# Patient Record
Sex: Male | Born: 1955 | Race: White | Hispanic: No | Smoking: Never smoker
Health system: Southern US, Community
[De-identification: ages and names within clinical notes are randomized; demographics above are authoritative.]

## PROBLEM LIST (undated history)

## (undated) DIAGNOSIS — E119 Type 2 diabetes mellitus without complications: Secondary | ICD-10-CM

## (undated) DIAGNOSIS — G4733 Obstructive sleep apnea (adult) (pediatric): Secondary | ICD-10-CM

## (undated) DIAGNOSIS — J449 Chronic obstructive pulmonary disease, unspecified: Secondary | ICD-10-CM

## (undated) DIAGNOSIS — C189 Malignant neoplasm of colon, unspecified: Secondary | ICD-10-CM

## (undated) DIAGNOSIS — I2510 Atherosclerotic heart disease of native coronary artery without angina pectoris: Secondary | ICD-10-CM

## (undated) DIAGNOSIS — I429 Cardiomyopathy, unspecified: Secondary | ICD-10-CM

## (undated) HISTORY — PX: COLON RESECTION: SHX5231

---

## 2021-05-26 HISTORY — PX: TRACHEOSTOMY: SUR1362

## 2021-05-26 HISTORY — PX: PEG PLACEMENT: SHX5437

## 2021-06-10 ENCOUNTER — Inpatient Hospital Stay: Admit: 2021-06-10 | Discharge: 2021-07-15 | Payer: Medicare Other | Source: Other Acute Inpatient Hospital

## 2021-06-10 ENCOUNTER — Other Ambulatory Visit (HOSPITAL_COMMUNITY): Payer: Medicare Other

## 2021-06-10 DIAGNOSIS — Z9911 Dependence on respirator [ventilator] status: Secondary | ICD-10-CM

## 2021-06-10 DIAGNOSIS — J90 Pleural effusion, not elsewhere classified: Secondary | ICD-10-CM

## 2021-06-10 DIAGNOSIS — A419 Sepsis, unspecified organism: Secondary | ICD-10-CM

## 2021-06-10 DIAGNOSIS — I5032 Chronic diastolic (congestive) heart failure: Secondary | ICD-10-CM

## 2021-06-10 DIAGNOSIS — J9621 Acute and chronic respiratory failure with hypoxia: Secondary | ICD-10-CM

## 2021-06-10 DIAGNOSIS — J811 Chronic pulmonary edema: Secondary | ICD-10-CM

## 2021-06-10 DIAGNOSIS — R6521 Severe sepsis with septic shock: Secondary | ICD-10-CM

## 2021-06-10 DIAGNOSIS — J189 Pneumonia, unspecified organism: Secondary | ICD-10-CM

## 2021-06-10 DIAGNOSIS — Z930 Tracheostomy status: Secondary | ICD-10-CM

## 2021-06-10 DIAGNOSIS — I480 Paroxysmal atrial fibrillation: Secondary | ICD-10-CM

## 2021-06-10 DIAGNOSIS — N170 Acute kidney failure with tubular necrosis: Secondary | ICD-10-CM

## 2021-06-10 DIAGNOSIS — J9690 Respiratory failure, unspecified, unspecified whether with hypoxia or hypercapnia: Secondary | ICD-10-CM

## 2021-06-10 DIAGNOSIS — N19 Unspecified kidney failure: Secondary | ICD-10-CM

## 2021-06-10 DIAGNOSIS — Z931 Gastrostomy status: Secondary | ICD-10-CM

## 2021-06-10 HISTORY — DX: Cardiomyopathy, unspecified: I42.9

## 2021-06-10 HISTORY — DX: Type 2 diabetes mellitus without complications: E11.9

## 2021-06-10 HISTORY — DX: Malignant neoplasm of colon, unspecified: C18.9

## 2021-06-10 HISTORY — DX: Chronic obstructive pulmonary disease, unspecified: J44.9

## 2021-06-10 HISTORY — DX: Atherosclerotic heart disease of native coronary artery without angina pectoris: I25.10

## 2021-06-10 HISTORY — DX: Obstructive sleep apnea (adult) (pediatric): G47.33

## 2021-06-10 MED ORDER — DIATRIZOATE MEGLUMINE & SODIUM 66-10 % PO SOLN
ORAL | Status: AC
  Administered 2021-06-10: 30 mL via GASTROSTOMY
  Filled 2021-06-10: qty 30

## 2021-06-11 DIAGNOSIS — I5032 Chronic diastolic (congestive) heart failure: Secondary | ICD-10-CM

## 2021-06-11 DIAGNOSIS — J9621 Acute and chronic respiratory failure with hypoxia: Secondary | ICD-10-CM

## 2021-06-11 DIAGNOSIS — N170 Acute kidney failure with tubular necrosis: Secondary | ICD-10-CM | POA: Diagnosis not present

## 2021-06-11 DIAGNOSIS — R6521 Severe sepsis with septic shock: Secondary | ICD-10-CM

## 2021-06-11 DIAGNOSIS — I480 Paroxysmal atrial fibrillation: Secondary | ICD-10-CM

## 2021-06-11 DIAGNOSIS — A419 Sepsis, unspecified organism: Secondary | ICD-10-CM

## 2021-06-11 DIAGNOSIS — Z9911 Dependence on respirator [ventilator] status: Secondary | ICD-10-CM

## 2021-06-11 DIAGNOSIS — Z930 Tracheostomy status: Secondary | ICD-10-CM

## 2021-06-11 LAB — BLOOD GAS, ARTERIAL
Acid-base deficit: 2.4 mmol/L — ABNORMAL HIGH (ref 0.0–2.0)
Bicarbonate: 21.9 mmol/L (ref 20.0–28.0)
FIO2: 40
O2 Saturation: 97.4 %
Patient temperature: 36.5
pCO2 arterial: 36.6 mmHg (ref 32.0–48.0)
pH, Arterial: 7.392 (ref 7.350–7.450)
pO2, Arterial: 88.8 mmHg (ref 83.0–108.0)

## 2021-06-11 LAB — TSH: TSH: 4.015 u[IU]/mL (ref 0.350–4.500)

## 2021-06-11 LAB — CBC
HCT: 28.1 % — ABNORMAL LOW (ref 39.0–52.0)
Hemoglobin: 8.6 g/dL — ABNORMAL LOW (ref 13.0–17.0)
MCH: 29.2 pg (ref 26.0–34.0)
MCHC: 30.6 g/dL (ref 30.0–36.0)
MCV: 95.3 fL (ref 80.0–100.0)
Platelets: 364 10*3/uL (ref 150–400)
RBC: 2.95 MIL/uL — ABNORMAL LOW (ref 4.22–5.81)
RDW: 15.5 % (ref 11.5–15.5)
WBC: 5.9 10*3/uL (ref 4.0–10.5)
nRBC: 0 % (ref 0.0–0.2)

## 2021-06-11 LAB — COMPREHENSIVE METABOLIC PANEL
ALT: 14 U/L (ref 0–44)
AST: 16 U/L (ref 15–41)
Albumin: 3 g/dL — ABNORMAL LOW (ref 3.5–5.0)
Alkaline Phosphatase: 63 U/L (ref 38–126)
Anion gap: 15 (ref 5–15)
BUN: 102 mg/dL — ABNORMAL HIGH (ref 8–23)
CO2: 21 mmol/L — ABNORMAL LOW (ref 22–32)
Calcium: 10.4 mg/dL — ABNORMAL HIGH (ref 8.9–10.3)
Chloride: 104 mmol/L (ref 98–111)
Creatinine, Ser: 2.21 mg/dL — ABNORMAL HIGH (ref 0.61–1.24)
GFR, Estimated: 32 mL/min — ABNORMAL LOW (ref >60)
Glucose, Bld: 131 mg/dL — ABNORMAL HIGH (ref 70–99)
Potassium: 3.5 mmol/L (ref 3.5–5.1)
Sodium: 140 mmol/L (ref 135–145)
Total Bilirubin: 0.6 mg/dL (ref 0.3–1.2)
Total Protein: 6.8 g/dL (ref 6.5–8.1)

## 2021-06-11 LAB — IRON AND TIBC
Iron: 27 ug/dL — ABNORMAL LOW (ref 45–182)
Saturation Ratios: 13 % — ABNORMAL LOW (ref 17.9–39.5)
TIBC: 211 ug/dL — ABNORMAL LOW (ref 250–450)
UIBC: 184 ug/dL

## 2021-06-11 LAB — PROTIME-INR
INR: 1.2 (ref 0.8–1.2)
Prothrombin Time: 15.2 seconds (ref 11.4–15.2)

## 2021-06-11 LAB — FOLATE: Folate: 15.9 ng/mL (ref >5.9)

## 2021-06-11 LAB — VITAMIN B12: Vitamin B-12: 1402 pg/mL — ABNORMAL HIGH (ref 180–914)

## 2021-06-11 LAB — APTT: aPTT: 31 seconds (ref 24–36)

## 2021-06-11 NOTE — Consult Note (Signed)
Pulmonary Critical Care Medicine [place]  Date of Service: 06/11/2021  PULMONARY CRITICAL CARE CONSULT   Tony Young  TFT:[id number]  DOB: [birth year]   DOA: 06/10/2021  Referring Physician: Satira Sark, MD  HPI: Tony Young is a 67 y.o. male seen for follow up of Acute on Chronic Respiratory Failure.  Patient is transferred to our facility for further management and weaning as multiple medical issues including CHF COPD CAD diabetes mellitus who came into the hospital because of a diagnosis of necrotizing fasciitis of lower extremity.  Patient underwent treatment with debridement antibiotics however had significant complications with development of acute renal failure respiratory failure sepsis and shock.  The patient ended up on mechanical ventilation ended up with a tracheostomy for failure to come off the ventilator and was therefore then transferred to our facility for further management and for weaning  Review of Systems:  ROS performed and is unremarkable other than noted above.  Past medical history: COPD CHF Diabetes Coronary artery disease Necrotizing fasciitis GI bleed Sepsis Glaucoma Hypothyroidism Anxiety  Past surgical history: Tracheostomy Debridement of lower extremity  Social history: Unknown tobacco alcohol drug abuse   Family History: Non-Contributory to the present illness  Medications: Reviewed on Rounds  Physical Exam:  Vitals: Temperature is 98.2 pulse 129 respiratory 24 blood pressure is 160/76 saturations 100%  Ventilator Settings on assist control tidal volume 550 PEEP 5  General: Comfortable at this time Eyes: Grossly normal lids, irises & conjunctiva ENT: grossly tongue is normal Neck: no obvious mass Cardiovascular: S1-S2 normal no gallop or rub Respiratory: Scattered coarse rhonchi Abdomen: Soft and nontender Skin: no rash seen on limited exam Musculoskeletal: not  rigid Psychiatric:unable to assess Neurologic: no seizure no involuntary movements         Labs on Admission:  Basic Metabolic Panel: Recent Labs  Lab 06/10/21 2342  NA 140  K 3.5  CL 104  CO2 21*  GLUCOSE 131*  BUN 102*  CREATININE 2.21*  CALCIUM 10.4*    Recent Labs  Lab 06/11/21 0130  PHART 7.392  PCO2ART 36.6  PO2ART 88.8  HCO3 21.9  O2SAT 97.4    Liver Function Tests: Recent Labs  Lab 06/10/21 2342  AST 16  ALT 14  ALKPHOS 63  BILITOT 0.6  PROT 6.8  ALBUMIN 3.0*   No results for input(s): LIPASE, AMYLASE in the last 168 hours. No results for input(s): AMMONIA in the last 168 hours.  CBC: Recent Labs  Lab 06/10/21 2342  WBC 5.9  HGB 8.6*  HCT 28.1*  MCV 95.3  PLT 364    Cardiac Enzymes: No results for input(s): CKTOTAL, CKMB, CKMBINDEX, TROPONINI in the last 168 hours.  BNP (last 3 results) No results for input(s): BNP in the last 8760 hours.  ProBNP (last 3 results) No results for input(s): PROBNP in the last 8760 hours.   Radiological Exams on Admission: DG ABDOMEN PEG TUBE LOCATION  Result Date: 06/10/2021 CLINICAL DATA:  Peg tube status. EXAM: ABDOMEN - 1 VIEW COMPARISON:  None. FINDINGS: Portable supine view of the abdomen was obtained after the installation of 30 cc Gastrografin via indwelling gastrostomy tube. Contrast opacifies the stomach. There is no evidence of extravasation or leak. IMPRESSION: Gastrostomy tube in the stomach. No evidence of extravasation or leak. Electronically Signed   By: Keith Rake M.D.   On: 06/10/2021 23:33   DG CHEST PORT 1 VIEW  Result Date: 06/10/2021 CLINICAL DATA:  PEG tube check.  EXAM: PORTABLE CHEST 1 VIEW COMPARISON:  None. FINDINGS: 11:22 p.m., 06/10/2021. A tracheostomy cannula has its tip 4.7 cm from the carina. There is a right IJ double-lumen catheter with its tip in the upper right atrium. The PEG tube is not within the imaged volume. Right PICC at least reaches as far as the mid SVC but  is obscured below that level due to the overlying double-lumen catheter. The heart is moderately enlarged. There is perihilar vascular congestion and moderate perihilar edema, with alveolar infiltrates extending from the hila into the mid and lower lung fields. Dense patchy opacities in the lower lung fields could be due to edema, atelectasis or superimposed pneumonia and are greater on the right. There is a moderate right and small left layering pleural effusions. The apical 1/3 of the lungs are clear. Thoracic cage is intact with thoracic spine spondylosis. IMPRESSION: 1. Cardiomegaly with perihilar vascular congestion and moderate central edema. 2. Perihilar and lower zonal opacities consistent with edema, pneumonia, atelectasis or combination, with right greater than left pleural effusions. 3. Support lines and tracheostomy cannula as above. PEG tube is not included in the image. Electronically Signed   By: Telford Nab M.D.   On: 06/10/2021 23:36    Assessment/Plan Active Problems:   Acute on chronic respiratory failure with hypoxia (HCC)   Severe sepsis with septic shock (HCC)   Tracheostomy status (HCC)   Chronic heart failure with preserved ejection fraction (HCC)   AF (paroxysmal atrial fibrillation) (HCC)   Acute renal failure due to tubular necrosis ([place])   Acute on chronic respiratory failure with hypoxia patient is on the ventilator and full support at the increased heart rate so respiratory therapist can hold off on trying weaning.  Chest x-ray still showing some central edema may benefit from diuresis we will review chart further Severe sepsis with shock right now patient is hemodynamically stable we will continue to monitor patient's counts and cultures. Tracheostomy status tracheostomy will remain in place we will continue to wean the patient as tolerated. Chronic heart failure with diastolic dysfunction preserved ejection fraction patient's fluid status needs to be followed closely  currently the chest film shows vascular congestion diuresis as tolerated Paroxysmal atrial fibrillation patient will need to be monitored for any recurrence Acute renal failure follow patient's lab work closely  I have personally seen and evaluated the patient, evaluated laboratory and imaging results, formulated the assessment and plan and placed orders. The Patient requires high complexity decision making with multiple systems involvement.  Case was discussed on Rounds with the Respiratory Therapy Director and the Respiratory staff Time Spent 70minutes  Ezio Wieck A Cruzita Lipa, MD [facility] Pulmonary Critical Care Medicine Sleep Medicine

## 2021-06-12 ENCOUNTER — Other Ambulatory Visit (HOSPITAL_COMMUNITY): Payer: Medicare Other

## 2021-06-12 DIAGNOSIS — I5032 Chronic diastolic (congestive) heart failure: Secondary | ICD-10-CM | POA: Diagnosis not present

## 2021-06-12 DIAGNOSIS — N170 Acute kidney failure with tubular necrosis: Secondary | ICD-10-CM | POA: Diagnosis not present

## 2021-06-12 DIAGNOSIS — I480 Paroxysmal atrial fibrillation: Secondary | ICD-10-CM | POA: Diagnosis not present

## 2021-06-12 DIAGNOSIS — J9621 Acute and chronic respiratory failure with hypoxia: Secondary | ICD-10-CM | POA: Diagnosis not present

## 2021-06-12 LAB — TRIGLYCERIDES: Triglycerides: 74 mg/dL (ref <150)

## 2021-06-12 LAB — CBC
HCT: 25.3 % — ABNORMAL LOW (ref 39.0–52.0)
Hemoglobin: 7.9 g/dL — ABNORMAL LOW (ref 13.0–17.0)
MCH: 29.6 pg (ref 26.0–34.0)
MCHC: 31.2 g/dL (ref 30.0–36.0)
MCV: 94.8 fL (ref 80.0–100.0)
Platelets: 373 10*3/uL (ref 150–400)
RBC: 2.67 MIL/uL — ABNORMAL LOW (ref 4.22–5.81)
RDW: 15.5 % (ref 11.5–15.5)
WBC: 4.4 10*3/uL (ref 4.0–10.5)
nRBC: 0 % (ref 0.0–0.2)

## 2021-06-12 LAB — RENAL FUNCTION PANEL
Albumin: 2.9 g/dL — ABNORMAL LOW (ref 3.5–5.0)
Anion gap: 12 (ref 5–15)
BUN: 116 mg/dL — ABNORMAL HIGH (ref 8–23)
CO2: 23 mmol/L (ref 22–32)
Calcium: 10.1 mg/dL (ref 8.9–10.3)
Chloride: 103 mmol/L (ref 98–111)
Creatinine, Ser: 2.64 mg/dL — ABNORMAL HIGH (ref 0.61–1.24)
GFR, Estimated: 26 mL/min — ABNORMAL LOW (ref >60)
Glucose, Bld: 330 mg/dL — ABNORMAL HIGH (ref 70–99)
Phosphorus: 5.5 mg/dL — ABNORMAL HIGH (ref 2.5–4.6)
Potassium: 4.4 mmol/L (ref 3.5–5.1)
Sodium: 138 mmol/L (ref 135–145)

## 2021-06-12 LAB — AMMONIA: Ammonia: 18 umol/L (ref 9–35)

## 2021-06-12 NOTE — Progress Notes (Signed)
Patient seen at bedside in [facility] for  therapeutic thoracentesis. [place] limited chest shows trace amount of pleural fluid noted  Insufficient to perform a safe thoracentesis. Procedure not performed.

## 2021-06-12 NOTE — Progress Notes (Signed)
[name]   PULMONARY CRITICAL CARE SERVICE  PROGRESS NOTE     Tony Young  [id number]:[id number]  DOB: 1956-03-24   DOA: 06/10/2021  Referring Physician: Satira Sark, MD  HPI: Tony Young is a 66 y.o. male being followed for ventilator/airway/oxygen weaning Acute on Chronic Respiratory Failure.  Patient remains on the ventilator looks little bit more comfortable today.  Review of the chest film shows possible pleural effusion on the right side there is also pleural effusion on the left  Medications: Reviewed on Rounds  Physical Exam:  Vitals: Temperature is 98.3 pulse 104 respiratory 23 blood pressure is 146/78 saturations 98%  Ventilator Settings on assist control FiO2 is 35% tidal volume 550 PEEP 8  General: Comfortable at this time Neck: supple Cardiovascular: no malignant arrhythmias Respiratory: Diminished breath sounds on the left as well as right Skin: no rash seen on limited exam Musculoskeletal: No gross abnormality Psychiatric:unable to assess Neurologic:no involuntary movements         Lab Data:   Basic Metabolic Panel: Recent Labs  Lab 06/10/21 2342 06/12/21 0139  NA 140 138  K 3.5 4.4  CL 104 103  CO2 21* 23  GLUCOSE 131* 330*  BUN 102* 116*  CREATININE 2.21* 2.64*  CALCIUM 10.4* 10.1  PHOS  --  5.5*    ABG: Recent Labs  Lab 06/11/21 0130  PHART 7.392  PCO2ART 36.6  PO2ART 88.8  HCO3 21.9  O2SAT 97.4    Liver Function Tests: Recent Labs  Lab 06/10/21 2342 06/12/21 0139  AST 16  --   ALT 14  --   ALKPHOS 63  --   BILITOT 0.6  --   PROT 6.8  --   ALBUMIN 3.0* 2.9*   No results for input(s): LIPASE, AMYLASE in the last 168 hours. Recent Labs  Lab 06/12/21 0139  AMMONIA 18    CBC: Recent Labs  Lab 06/10/21 2342 06/12/21 0139  WBC 5.9 4.4  HGB 8.6* 7.9*  HCT 28.1* 25.3*  MCV 95.3 94.8  PLT 364 373    Cardiac Enzymes: No results for input(s):  CKTOTAL, CKMB, CKMBINDEX, TROPONINI in the last 168 hours.  BNP (last 3 results) No results for input(s): BNP in the last 8760 hours.  ProBNP (last 3 results) No results for input(s): PROBNP in the last 8760 hours.  Radiological Exams: DG ABDOMEN PEG TUBE LOCATION  Result Date: 06/10/2021 CLINICAL DATA:  Peg tube status. EXAM: ABDOMEN - 1 VIEW COMPARISON:  None. FINDINGS: Portable supine view of the abdomen was obtained after the installation of 30 cc Gastrografin via indwelling gastrostomy tube. Contrast opacifies the stomach. There is no evidence of extravasation or leak. IMPRESSION: Gastrostomy tube in the stomach. No evidence of extravasation or leak. Electronically Signed   By: Keith Rake M.D.   On: 06/10/2021 23:33   DG CHEST PORT 1 VIEW  Result Date: 06/10/2021 CLINICAL DATA:  PEG tube check. EXAM: PORTABLE CHEST 1 VIEW COMPARISON:  None. FINDINGS: 11:22 p.m., 06/10/2021. A tracheostomy cannula has its tip 4.7 cm from the carina. There is a right IJ double-lumen catheter with its tip in the upper right atrium. The PEG tube is not within the imaged volume. Right PICC at least reaches as far as the mid SVC but is obscured below that level due to the overlying double-lumen catheter. The heart is moderately enlarged. There is perihilar vascular congestion and moderate perihilar edema, with alveolar infiltrates extending from the hila into  the mid and lower lung fields. Dense patchy opacities in the lower lung fields could be due to edema, atelectasis or superimposed pneumonia and are greater on the right. There is a moderate right and small left layering pleural effusions. The apical 1/3 of the lungs are clear. Thoracic cage is intact with thoracic spine spondylosis. IMPRESSION: 1. Cardiomegaly with perihilar vascular congestion and moderate central edema. 2. Perihilar and lower zonal opacities consistent with edema, pneumonia, atelectasis or combination, with right greater than left pleural  effusions. 3. Support lines and tracheostomy cannula as above. PEG tube is not included in the image. Electronically Signed   By: Telford Nab M.D.   On: 06/10/2021 23:36    Assessment/Plan Active Problems:   Acute on chronic respiratory failure with hypoxia (HCC)   Severe sepsis with septic shock (HCC)   Tracheostomy status (HCC)   Chronic heart failure with preserved ejection fraction (HCC)   AF (paroxysmal atrial fibrillation) (HCC)   Acute renal failure due to tubular necrosis (HCC)   Acute on chronic respiratory failure with hypoxia plan is going to be to continue with the ventilator on full support currently on assist control mode we will continue with pulmonary toilet supportive care Pleural effusion no change we will continue to follow along closely. Chronic heart failure preserved ejection fraction we will need to monitor the patient's fluid status very closely pleural effusions as noted above Paroxysmal atrial fibrillation rate is controlled at this time we will continue with supportive care Severe sepsis with shock treated resolving hemodynamics are stable Acute renal failure following patient's lab work closely   I have personally seen and evaluated the patient, evaluated laboratory and imaging results, formulated the assessment and plan and placed orders. The Patient requires high complexity decision making with multiple systems involvement.  Rounds were done with the Respiratory Therapy Director and Staff therapists and discussed with nursing staff also.  [name], MD [facility] Pulmonary Critical Care Medicine Sleep Medicine

## 2021-06-13 LAB — CULTURE, RESPIRATORY W GRAM STAIN: Culture: NORMAL

## 2021-06-14 ENCOUNTER — Other Ambulatory Visit (HOSPITAL_COMMUNITY): Payer: Medicare Other

## 2021-06-14 DIAGNOSIS — N170 Acute kidney failure with tubular necrosis: Secondary | ICD-10-CM | POA: Diagnosis not present

## 2021-06-14 DIAGNOSIS — I480 Paroxysmal atrial fibrillation: Secondary | ICD-10-CM | POA: Diagnosis not present

## 2021-06-14 DIAGNOSIS — I5032 Chronic diastolic (congestive) heart failure: Secondary | ICD-10-CM | POA: Diagnosis not present

## 2021-06-14 DIAGNOSIS — J9621 Acute and chronic respiratory failure with hypoxia: Secondary | ICD-10-CM | POA: Diagnosis not present

## 2021-06-14 LAB — CBC
HCT: 27.1 % — ABNORMAL LOW (ref 39.0–52.0)
Hemoglobin: 8.7 g/dL — ABNORMAL LOW (ref 13.0–17.0)
MCH: 30.4 pg (ref 26.0–34.0)
MCHC: 32.1 g/dL (ref 30.0–36.0)
MCV: 94.8 fL (ref 80.0–100.0)
Platelets: 468 10*3/uL — ABNORMAL HIGH (ref 150–400)
RBC: 2.86 MIL/uL — ABNORMAL LOW (ref 4.22–5.81)
RDW: 15.2 % (ref 11.5–15.5)
WBC: 6.8 10*3/uL (ref 4.0–10.5)
nRBC: 0.3 % — ABNORMAL HIGH (ref 0.0–0.2)

## 2021-06-14 LAB — RENAL FUNCTION PANEL
Albumin: 3 g/dL — ABNORMAL LOW (ref 3.5–5.0)
Anion gap: 14 (ref 5–15)
BUN: 126 mg/dL — ABNORMAL HIGH (ref 8–23)
CO2: 25 mmol/L (ref 22–32)
Calcium: 10.4 mg/dL — ABNORMAL HIGH (ref 8.9–10.3)
Chloride: 105 mmol/L (ref 98–111)
Creatinine, Ser: 2.53 mg/dL — ABNORMAL HIGH (ref 0.61–1.24)
GFR, Estimated: 27 mL/min — ABNORMAL LOW (ref >60)
Glucose, Bld: 185 mg/dL — ABNORMAL HIGH (ref 70–99)
Phosphorus: 6.4 mg/dL — ABNORMAL HIGH (ref 2.5–4.6)
Potassium: 3.7 mmol/L (ref 3.5–5.1)
Sodium: 144 mmol/L (ref 135–145)

## 2021-06-14 NOTE — Progress Notes (Signed)
Pulmonary [place]   PULMONARY CRITICAL CARE SERVICE  PROGRESS NOTE     Tony Young  IZT:[id number]  DOB: 1955-06-07   DOA: 06/10/2021  Referring Physician: Satira Sark, MD  HPI: Tony Young is a 66 y.o. male being followed for ventilator/airway/oxygen weaning Acute on Chronic Respiratory Failure.  Patient is on the ventilator full support assist control mode currently on 35% FiO2 had a thoracentesis attempted yesterday not enough fluid  Medications: Reviewed on Rounds  Physical Exam:  Vitals: Temperature is 98.8 pulse 112 respiratory 30 blood pressure is 121/67 saturations 99%  Ventilator Settings assist control FiO2 35% tidal volume is 550 PEEP 8  General: Comfortable at this time Neck: supple Cardiovascular: no malignant arrhythmias Respiratory: Scattered rhonchi expansion is equal Skin: no rash seen on limited exam Musculoskeletal: No gross abnormality Psychiatric:unable to assess Neurologic:no involuntary movements         Lab Data:   Basic Metabolic Panel: Recent Labs  Lab 06/10/21 2342 06/12/21 0139 06/14/21 0504  NA 140 138 144  K 3.5 4.4 3.7  CL 104 103 105  CO2 21* 23 25  GLUCOSE 131* 330* 185*  BUN 102* 116* 126*  CREATININE 2.21* 2.64* 2.53*  CALCIUM 10.4* 10.1 10.4*  PHOS  --  5.5* 6.4*    ABG: Recent Labs  Lab 06/11/21 0130  PHART 7.392  PCO2ART 36.6  PO2ART 88.8  HCO3 21.9  O2SAT 97.4    Liver Function Tests: Recent Labs  Lab 06/10/21 2342 06/12/21 0139 06/14/21 0504  AST 16  --   --   ALT 14  --   --   ALKPHOS 63  --   --   BILITOT 0.6  --   --   PROT 6.8  --   --   ALBUMIN 3.0* 2.9* 3.0*   No results for input(s): LIPASE, AMYLASE in the last 168 hours. Recent Labs  Lab 06/12/21 0139  AMMONIA 18    CBC: Recent Labs  Lab 06/10/21 2342 06/12/21 0139 06/14/21 0504  WBC 5.9 4.4 6.8  HGB 8.6* 7.9* 8.7*  HCT 28.1* 25.3* 27.1*  MCV 95.3 94.8  94.8  PLT 364 373 468*    Cardiac Enzymes: No results for input(s): CKTOTAL, CKMB, CKMBINDEX, TROPONINI in the last 168 hours.  BNP (last 3 results) No results for input(s): BNP in the last 8760 hours.  ProBNP (last 3 results) No results for input(s): PROBNP in the last 8760 hours.  Radiological Exams: DG Chest Port 1 View  Result Date: 06/14/2021 CLINICAL DATA:  Shortness of breath EXAM: PORTABLE CHEST 1 VIEW COMPARISON:  Four days ago FINDINGS: Improved vascular congestion and hazy appearance of the chest. Continued cardiopericardial enlargement. Dialysis catheter on the right. Tracheostomy tube in place. No pneumothorax. IMPRESSION: Improved aeration, likely improving edema and pleural fluid. Electronically Signed   By: Jorje Guild M.D.   On: 06/14/2021 07:09   IR US CHEST  Result Date: 06/12/2021 CLINICAL DATA:  66 year old male with concern for pleural effusion. EXAM: CHEST ULTRASOUND COMPARISON:  Chest radiograph from 06/10/2021 FINDINGS: No evidence of significant pleural effusion bilaterally. IMPRESSION: No evidence of significant pleural effusion bilaterally. No thoracentesis was performed. Ruthann Cancer, MD [facility] Radiology Electronically Signed   By: Ruthann Cancer M.D.   On: 06/12/2021 16:28    Assessment/Plan Active Problems:   Acute on chronic respiratory failure with hypoxia (HCC)   Severe sepsis with septic shock (HCC)   Tracheostomy  status ([place])   Chronic heart failure with preserved ejection fraction (HCC)   AF (paroxysmal atrial fibrillation) (HCC)   Acute renal failure due to tubular necrosis (HCC)   Acute on chronic respiratory failure with hypoxia we will continue with full support on the ventilator patient is on assist control mode titrating oxygen as tolerated.  CT scan suggested for follow-up on the abnormalities noted on the chest film Severe sepsis with shock hemodynamics are stable improved no fevers  are noted at this time Pleural effusion thoracentesis evaluated not enough fluid to do withdrawal Chronic heart failure preserved ejection fraction compensated slowly improving x-rays we will continue to monitor closely Atrial fibrillation rate is controlled at this time Acute renal failure following patient's lab work closely   I have personally seen and evaluated the patient, evaluated laboratory and imaging results, formulated the assessment and plan and placed orders. The Patient requires high complexity decision making with multiple systems involvement.  Rounds were done with the Respiratory Therapy Director and Staff therapists and discussed with nursing staff also.  [name], MD [facility] Pulmonary Critical Care Medicine Sleep Medicine

## 2021-06-14 NOTE — Consult Note (Signed)
Infectious Disease Consultation   Tony Young  KPT:[id number]  DOB: 1956/05/09  DOA: 06/10/2021  Requesting physician: Dr. Owens Shark  Reason for consultation: Antibiotic Recommendations   History of Present Illness: Tony Young is an 66 y.o. male with medical history significant for COPD, congestive heart failure, diabetes mellitus, coronary artery disease who was admitted to the acute facility on 05/13/2021 with complaints of worsening shortness of breath, fever, found to have necrotizing fasciitis of the left lower extremity.  He was started on broad-spectrum antimicrobials. He underwent multiple debridements.  Hospital course was complicated by acute kidney injury needing dialysis, respiratory failure, sepsis with shock, GI bleed, bacteremia.  He was seen by GI, wound care, cardiology, general surgery, nephrology, infectious disease.  He was initially treated with IV vancomycin.  However, due to the renal failure vancomycin was discontinued and he was started on doxycycline.  He also had respiratory failure due to sepsis and shock and was placed on mechanical ventilation and ended up with a tracheostomy.  He was eventually stabilized and transferred to [facility].  After admission here he apparently had fever.  Review of Systems:  patient is encephalopathic and confused.  Unable to obtain review of systems at this time.   Past Medical History: Coronary artery disease, COPD, CHF, diabetes, anxiety, depression, hypothyroidism, necrotizing fasciitis, history of GI bleed  Past Surgical History: Tracheostomy, multiple debridements of lower extremity  Allergies: Penicillin, reaction unknown.   Social History:  has no history on file for tobacco use, alcohol use, and drug use.   Family History: No family history on file. Unable to obtain at this time   Physical Exam: Temperature 98.8, pulse 112, respiratory 34, blood pressure 120/67, oxygen  saturation 99%  Constitutional: Ill-appearing male, encephalopathic Head: Atraumatic, normocephalic Eyes: PERLA ENMT: external ears and nose appear normal, normal hearing, no ear or nose lesions, moist oral mucosa            Neck: Has trach in place CVS: S1-S2   Respiratory: Coarse breath sounds, scattered rhonchi Abdomen: soft nontender, nondistended, normal bowel sounds  Musculoskeletal: Lower extremity edema  Neuro: He is confused, remains on the vent, unable to do a neurologic exam at this time Psych: judgement and insight appear normal, stable mood and affect, mental status Skin: no rashes, unstageable sacrococcygeal pressure ulcer, left lower extremity wound with dressing  Data reviewed:  I have personally reviewed following labs and imaging studies Labs:  CBC: Recent Labs  Lab 06/10/21 2342 06/12/21 0139 06/14/21 0504  WBC 5.9 4.4 6.8  HGB 8.6* 7.9* 8.7*  HCT 28.1* 25.3* 27.1*  MCV 95.3 94.8 94.8  PLT 364 373 468*    Basic Metabolic Panel: Recent Labs  Lab 06/10/21 2342 06/12/21 0139 06/14/21 0504  NA 140 138 144  K 3.5 4.4 3.7  CL 104 103 105  CO2 21* 23 25  GLUCOSE 131* 330* 185*  BUN 102* 116* 126*  CREATININE 2.21* 2.64* 2.53*  CALCIUM 10.4* 10.1 10.4*  PHOS  --  5.5* 6.4*   GFR CrCl cannot be calculated (Unknown ideal weight.). Liver Function Tests: Recent Labs  Lab 06/10/21 2342 06/12/21 0139 06/14/21 0504  AST 16  --   --   ALT 14  --   --   ALKPHOS 63  --   --   BILITOT 0.6  --   --   PROT 6.8  --   --   ALBUMIN 3.0*  2.9* 3.0*   No results for input(s): LIPASE, AMYLASE in the last 168 hours. Recent Labs  Lab 06/12/21 0139  AMMONIA 18   Coagulation profile Recent Labs  Lab 06/10/21 2342  INR 1.2    Cardiac Enzymes: No results for input(s): CKTOTAL, CKMB, CKMBINDEX, TROPONINI in the last 168 hours. BNP: Invalid input(s): POCBNP CBG: No results for input(s): GLUCAP in the last 168 hours. D-Dimer No results for input(s):  DDIMER in the last 72 hours. Hgb A1c No results for input(s): HGBA1C in the last 72 hours. Lipid Profile Recent Labs    06/12/21 0139  TRIG 74   Thyroid function studies No results for input(s): TSH, T4TOTAL, T3FREE, THYROIDAB in the last 72 hours.  Invalid input(s): FREET3 Anemia work up No results for input(s): VITAMINB12, FOLATE, FERRITIN, TIBC, IRON, RETICCTPCT in the last 72 hours. Urinalysis No results found for: COLORURINE, APPEARANCEUR, [place], [place], [place], [place], [place], [place], PROTEINUR, UROBILINOGEN, NITRITE, LEUKOCYTESUR   Sepsis Labs Invalid input(s): PROCALCITONIN,  WBC,  LACTICIDVEN Microbiology Recent Results (from the past 240 hour(s))  Culture, Respiratory w Gram Stain     Status: None   Collection Time: 06/11/21  4:30 PM   Specimen: Tracheal Aspirate; Respiratory  Result Value Ref Range Status   Specimen Description TRACHEAL ASPIRATE  Final   Special Requests NONE  Final   Gram Stain   Final    MODERATE WBC PRESENT,BOTH PMN AND MONONUCLEAR RARE GRAM POSITIVE COCCI RARE GRAM POSITIVE RODS    Culture   Final    RARE Consistent with normal respiratory flora. No Pseudomonas species isolated Performed at [facility] [address] [place], [place] [address]    Report Status 06/13/2021 FINAL  Final       Inpatient Medications:   Scheduled Meds: Please see MAR   Radiological Exams on Admission: DG Chest Port 1 View  Result Date: 06/14/2021 CLINICAL DATA:  Shortness of breath EXAM: PORTABLE CHEST 1 VIEW COMPARISON:  Four days ago FINDINGS: Improved vascular congestion and hazy appearance of the chest. Continued cardiopericardial enlargement. Dialysis catheter on the right. Tracheostomy tube in place. No pneumothorax. IMPRESSION: Improved aeration, likely improving edema and pleural fluid. Electronically Signed   By: Jorje Guild M.D.   On: 06/14/2021 07:09    Impression/Recommendations Active Problems: Acute on chronic  hypoxemic respiratory failure, ventilator dependent Pneumonia Left lower extremity necrotizing fasciitis Sepsis with shock, currently shock resolved Pulmonary edema/pleural effusion Acute kidney injury Anemia/GI bleed Diabetes mellitus type 2 Congestive heart failure, systolic with EF 94-85% Atrial fibrillation Encephalopathy, likely toxic/metabolic Sacrococcygeal pressure ulcer, unstageable Dysphagia/protein calorie malnutrition History of colon cancer status post resection History of NSTEMI   Acute on chronic hypoxemic respiratory failure: This was in the setting of sepsis with septic shock and organ dysfunction.  He continues to be on the ventilator.  Chest x-ray also showed findings concerning for pneumonia.  Recommend to send for respiratory cultures.  He is currently on doxycycline, Flagyl.  Continue current antimicrobials.  However, if he starts having worsening respiratory failure or worsening fevers or leukocytosis then we may need to consider broadening the antimicrobials after sending the cultures.  If his respiratory status worsens would recommend repeat chest imaging preferably chest CT which can be done without contrast given the recent renal failure.   Pneumonia: Prior chest x-ray showed findings concerning for pneumonia.  Recommend to repeat chest x-ray.  Antibiotics as mentioned above.  Again, if he is not improving or if he starts having worsening fevers  or worsening leukocytosis we may need to consider broadening antimicrobials.   Left lower extremity necrotizing fasciitis: He also had sepsis secondary to the necrotizing fasciitis at the acute facility with shock.  Currently off pressors and shock is resolved.  He underwent multiple debridements at the acute facility.  Continue local wound care.  On antibiotics as mentioned above.  Tentative end date for the antibiotics will be 06/20/2021 pending improvement.   Sepsis with septic shock: He was on pressors and broad-spectrum  antimicrobials at the acute facility.  The sepsis with shock was secondary to left lower extremity necrotizing fasciitis.  He underwent multiple debridements.  Currently shock is resolved.  However, he is high risk for recurrent sepsis.  On antibiotics as mentioned above.  If he starts having any worsening fevers or worsening leukocytosis recommend to send for pancultures and also repeat imaging and broaden antimicrobials.   Pulmonary edema/pleural effusion: IR consulted for thoracentesis.  On IV Lasix.  Further management per the primary team.   Acute kidney injury: Patient apparently had acute kidney injury likely secondary to ATN in the setting of septic shock.  He underwent CRRT/hemodialysis at the acute facility.  Nephrology consulted by the primary team.  Currently hemodialysis is on hold.  He is on IV Lasix for diuresis.  He apparently was on IV vancomycin at the acute facility which had to be discontinued due to the AKI and currently on doxycycline.  Continue to monitor BUN/creatinine closely.  Further management per primary team.  Avoid nephrotoxic medications.   Anemia/GI bleed: He is status post PRBCs at the outside hospital.  Continue to monitor hemoglobin.  Further investigation and management per primary team.   Diabetes mellitus type 2: Continue medication and management per primary team.  Will need proper glycemic control in order to enable wound healing.   Congestive heart failure: Likely systolic.  He had an echo at the acute facility with reported EF 30-35% with mild to moderate mitral regurgitation, reduced ventricular function.  On IV Lasix.  Continue medications and management per the primary team.   Atrial fibrillation: Continue medications and management per primary team.  He is on amiodarone and metoprolol.   Encephalopathy: Likely toxic/metabolic in the setting of septic shock with necrotizing fasciitis.  Head CT at the acute facility was negative.  On antibiotics as mentioned  above.  Continue to monitor.  Further management per the primary team.   Sacrococcygeal pressure ulcer: Patient unfortunately due to his bedbound status and immobility developing sacrococcygeal pressure ulcer, currently unstageable.  On antibiotics as mentioned above.  Continue to monitor.  Continue local wound care.  If worsening, consider consulting surgery to evaluate for debridement.   Dysphagia/protein calorie malnutrition: Due to the dysphagia he is high risk for recurrent aspiration and aspiration pneumonia.  Management of protein calorie malnutrition per the primary team.   History of colon cancer s/p resection: Follow-up outpatient.   NSTEMI: On aspirin.  Further management per the primary team.   Unfortunately due to his multiple complex medical problems he is high risk for worsening and decompensation.   Thank you for this consultation.  Plan of care discussed with the primary team and pharmacy.  Thank you for this consultation.    Yaakov Guthrie M.D. 06/14/2021, 10:33 PM

## 2021-06-15 ENCOUNTER — Other Ambulatory Visit (HOSPITAL_COMMUNITY): Payer: Medicare Other

## 2021-06-15 DIAGNOSIS — J9621 Acute and chronic respiratory failure with hypoxia: Secondary | ICD-10-CM | POA: Diagnosis not present

## 2021-06-15 DIAGNOSIS — I480 Paroxysmal atrial fibrillation: Secondary | ICD-10-CM | POA: Diagnosis not present

## 2021-06-15 DIAGNOSIS — I5032 Chronic diastolic (congestive) heart failure: Secondary | ICD-10-CM | POA: Diagnosis not present

## 2021-06-15 DIAGNOSIS — N170 Acute kidney failure with tubular necrosis: Secondary | ICD-10-CM | POA: Diagnosis not present

## 2021-06-15 LAB — CBC
HCT: 25.8 % — ABNORMAL LOW (ref 39.0–52.0)
Hemoglobin: 8.1 g/dL — ABNORMAL LOW (ref 13.0–17.0)
MCH: 29.9 pg (ref 26.0–34.0)
MCHC: 31.4 g/dL (ref 30.0–36.0)
MCV: 95.2 fL (ref 80.0–100.0)
Platelets: 385 10*3/uL (ref 150–400)
RBC: 2.71 MIL/uL — ABNORMAL LOW (ref 4.22–5.81)
RDW: 15 % (ref 11.5–15.5)
WBC: 7.6 10*3/uL (ref 4.0–10.5)
nRBC: 0 % (ref 0.0–0.2)

## 2021-06-15 LAB — URINALYSIS, MICROSCOPIC (REFLEX): Squamous Epithelial / HPF: NONE SEEN (ref 0–5)

## 2021-06-15 LAB — BASIC METABOLIC PANEL
Anion gap: 14 (ref 5–15)
BUN: 124 mg/dL — ABNORMAL HIGH (ref 8–23)
CO2: 25 mmol/L (ref 22–32)
Calcium: 10.5 mg/dL — ABNORMAL HIGH (ref 8.9–10.3)
Chloride: 107 mmol/L (ref 98–111)
Creatinine, Ser: 2.55 mg/dL — ABNORMAL HIGH (ref 0.61–1.24)
GFR, Estimated: 27 mL/min — ABNORMAL LOW (ref >60)
Glucose, Bld: 134 mg/dL — ABNORMAL HIGH (ref 70–99)
Potassium: 3.6 mmol/L (ref 3.5–5.1)
Sodium: 146 mmol/L — ABNORMAL HIGH (ref 135–145)

## 2021-06-15 LAB — TRIGLYCERIDES: Triglycerides: 144 mg/dL (ref <150)

## 2021-06-15 LAB — LACTIC ACID, PLASMA: Lactic Acid, Venous: 1.8 mmol/L (ref 0.5–1.9)

## 2021-06-15 LAB — URINALYSIS, ROUTINE W REFLEX MICROSCOPIC
Bilirubin Urine: NEGATIVE
Glucose, UA: NEGATIVE mg/dL
Ketones, ur: NEGATIVE mg/dL
Leukocytes,Ua: NEGATIVE
Nitrite: NEGATIVE
Protein, ur: NEGATIVE mg/dL
Specific Gravity, Urine: 1.02 (ref 1.005–1.030)
pH: 5.5 (ref 5.0–8.0)

## 2021-06-15 LAB — PHOSPHORUS: Phosphorus: 5.6 mg/dL — ABNORMAL HIGH (ref 2.5–4.6)

## 2021-06-15 LAB — MAGNESIUM: Magnesium: 2.1 mg/dL (ref 1.7–2.4)

## 2021-06-15 LAB — ALBUMIN: Albumin: 2.8 g/dL — ABNORMAL LOW (ref 3.5–5.0)

## 2021-06-15 NOTE — Progress Notes (Signed)
Pulmonary [place]   PULMONARY CRITICAL CARE SERVICE  PROGRESS NOTE     Irineo Gaulin  TIR:[id number]  DOB: 01-Oct-1955   DOA: 06/10/2021  Referring Physician: Satira Sark, MD  HPI: Tony Young is a 66 y.o. male being followed for ventilator/airway/oxygen weaning Acute on Chronic Respiratory Failure.  Patient is currently on assist control mode has been on 30% FiO2 with a PEEP of 8  Medications: Reviewed on Rounds  Physical Exam:  Vitals: Temperature is 96.8 pulse 82 respiratory 17 blood pressure is 117/67 saturations 97%  Ventilator Settings on assist control FiO2 30% tidal volume 496 PEEP 8  General: Comfortable at this time Neck: supple Cardiovascular: no malignant arrhythmias Respiratory: Scattered rhonchi expansion is equal Skin: no rash seen on limited exam Musculoskeletal: No gross abnormality Psychiatric:unable to assess Neurologic:no involuntary movements         Lab Data:   Basic Metabolic Panel: Recent Labs  Lab 06/10/21 2342 06/12/21 0139 06/14/21 0504 06/15/21 0418  NA 140 138 144 146*  K 3.5 4.4 3.7 3.6  CL 104 103 105 107  CO2 21* 23 25 25   GLUCOSE 131* 330* 185* 134*  BUN 102* 116* 126* 124*  CREATININE 2.21* 2.64* 2.53* 2.55*  CALCIUM 10.4* 10.1 10.4* 10.5*  MG  --   --   --  2.1  PHOS  --  5.5* 6.4* 5.6*    ABG: Recent Labs  Lab 06/11/21 0130  PHART 7.392  PCO2ART 36.6  PO2ART 88.8  HCO3 21.9  O2SAT 97.4    Liver Function Tests: Recent Labs  Lab 06/10/21 2342 06/12/21 0139 06/14/21 0504 06/15/21 0418  AST 16  --   --   --   ALT 14  --   --   --   ALKPHOS 63  --   --   --   BILITOT 0.6  --   --   --   PROT 6.8  --   --   --   ALBUMIN 3.0* 2.9* 3.0* 2.8*   No results for input(s): LIPASE, AMYLASE in the last 168 hours. Recent Labs  Lab 06/12/21 0139  AMMONIA 18    CBC: Recent Labs  Lab 06/10/21 2342 06/12/21 0139 06/14/21 0504 06/15/21 0418   WBC 5.9 4.4 6.8 7.6  HGB 8.6* 7.9* 8.7* 8.1*  HCT 28.1* 25.3* 27.1* 25.8*  MCV 95.3 94.8 94.8 95.2  PLT 364 373 468* 385    Cardiac Enzymes: No results for input(s): CKTOTAL, CKMB, CKMBINDEX, TROPONINI in the last 168 hours.  BNP (last 3 results) No results for input(s): BNP in the last 8760 hours.  ProBNP (last 3 results) No results for input(s): PROBNP in the last 8760 hours.  Radiological Exams: DG CHEST PORT 1 VIEW  Result Date: 06/15/2021 CLINICAL DATA:  Sepsis. EXAM: PORTABLE CHEST 1 VIEW COMPARISON:  06/14/2018 FINDINGS: Dialysis catheter is identified with tip in the projection of the right atrium. Tracheostomy tube tip is above the carina. Stable cardiomediastinal contours. Pulmonary vascular congestion and right pleural effusion appear unchanged. IMPRESSION: No change in pulmonary vascular congestion and right pleural effusion. Electronically Signed   By: Kerby Moors M.D.   On: 06/15/2021 07:07   DG Chest Port 1 View  Result Date: 06/14/2021 CLINICAL DATA:  Shortness of breath EXAM: PORTABLE CHEST 1 VIEW COMPARISON:  Four days ago FINDINGS: Improved vascular congestion and hazy appearance of the chest. Continued cardiopericardial enlargement. Dialysis catheter on the right. Tracheostomy tube in place.  No pneumothorax. IMPRESSION: Improved aeration, likely improving edema and pleural fluid. Electronically Signed   By: Jorje Guild M.D.   On: 06/14/2021 07:09    Assessment/Plan Active Problems:   Acute on chronic respiratory failure with hypoxia (HCC)   Severe sepsis with septic shock (HCC)   Tracheostomy status (HCC)   Chronic heart failure with preserved ejection fraction (HCC)   AF (paroxysmal atrial fibrillation) (HCC)   Acute renal failure due to tubular necrosis (HCC)   Acute on chronic respiratory failure hypoxia plan is going to be to continue with the weaning process have respiratory therapy check the mechanics and hours as needed try to wean once  again. Chronic heart failure preserved ejection fraction we will continue to monitor the patient's fluid status closely just be doing a little bit better chest x-ray shows improved aeration Severe sepsis with shock resolved hemodynamics are stable Atrial fibrillation rate is controlled Acute renal failure following patient's lab work closely.   I have personally seen and evaluated the patient, evaluated laboratory and imaging results, formulated the assessment and plan and placed orders. The Patient requires high complexity decision making with multiple systems involvement.  Rounds were done with the Respiratory Therapy Director and Staff therapists and discussed with nursing staff also.  [name], MD [facility] Pulmonary Critical Care Medicine Sleep Medicine

## 2021-06-16 DIAGNOSIS — N170 Acute kidney failure with tubular necrosis: Secondary | ICD-10-CM | POA: Diagnosis not present

## 2021-06-16 DIAGNOSIS — I5032 Chronic diastolic (congestive) heart failure: Secondary | ICD-10-CM | POA: Diagnosis not present

## 2021-06-16 DIAGNOSIS — I480 Paroxysmal atrial fibrillation: Secondary | ICD-10-CM | POA: Diagnosis not present

## 2021-06-16 DIAGNOSIS — J9621 Acute and chronic respiratory failure with hypoxia: Secondary | ICD-10-CM | POA: Diagnosis not present

## 2021-06-16 LAB — MAGNESIUM: Magnesium: 2.3 mg/dL (ref 1.7–2.4)

## 2021-06-16 LAB — CBC
HCT: 26.2 % — ABNORMAL LOW (ref 39.0–52.0)
Hemoglobin: 8.4 g/dL — ABNORMAL LOW (ref 13.0–17.0)
MCH: 30.4 pg (ref 26.0–34.0)
MCHC: 32.1 g/dL (ref 30.0–36.0)
MCV: 94.9 fL (ref 80.0–100.0)
Platelets: 259 10*3/uL (ref 150–400)
RBC: 2.76 MIL/uL — ABNORMAL LOW (ref 4.22–5.81)
RDW: 14.8 % (ref 11.5–15.5)
WBC: 5.4 10*3/uL (ref 4.0–10.5)
nRBC: 0 % (ref 0.0–0.2)

## 2021-06-16 LAB — RENAL FUNCTION PANEL
Albumin: 2.9 g/dL — ABNORMAL LOW (ref 3.5–5.0)
Anion gap: 18 — ABNORMAL HIGH (ref 5–15)
BUN: 133 mg/dL — ABNORMAL HIGH (ref 8–23)
CO2: 26 mmol/L (ref 22–32)
Calcium: 10.6 mg/dL — ABNORMAL HIGH (ref 8.9–10.3)
Chloride: 107 mmol/L (ref 98–111)
Creatinine, Ser: 2.7 mg/dL — ABNORMAL HIGH (ref 0.61–1.24)
GFR, Estimated: 25 mL/min — ABNORMAL LOW (ref >60)
Glucose, Bld: 301 mg/dL — ABNORMAL HIGH (ref 70–99)
Phosphorus: 5.2 mg/dL — ABNORMAL HIGH (ref 2.5–4.6)
Potassium: 3.6 mmol/L (ref 3.5–5.1)
Sodium: 151 mmol/L — ABNORMAL HIGH (ref 135–145)

## 2021-06-16 LAB — LACTIC ACID, PLASMA: Lactic Acid, Venous: 2.5 mmol/L (ref 0.5–1.9)

## 2021-06-16 LAB — URINE CULTURE: Culture: NO GROWTH

## 2021-06-16 LAB — C DIFFICILE (CDIFF) QUICK SCRN (NO PCR REFLEX)
C Diff antigen: NEGATIVE
C Diff interpretation: NOT DETECTED
C Diff toxin: NEGATIVE

## 2021-06-16 NOTE — Progress Notes (Signed)
Pulmonary [place]   PULMONARY CRITICAL CARE SERVICE  PROGRESS NOTE     Santos Sollenberger  JME:[id number]  DOB: [birth year]   DOA: 06/10/2021  Referring Physician: Satira Sark, MD  HPI: Tony Young is a 66 y.o. male being followed for ventilator/airway/oxygen weaning Acute on Chronic Respiratory Failure.  Patient is resting comfortably without distress did well on a spontaneous breathing trial now is on pressure support wean  Medications: Reviewed on Rounds  Physical Exam:  Vitals: Temperature is 98.5 pulse 104 respiratory 27 blood pressure is 123/73 saturations 95%  Ventilator Settings pressure support FiO2 28% pressure 12/6 tidal volume was 850 spontaneous  General: Comfortable at this time Neck: supple Cardiovascular: no malignant arrhythmias Respiratory: Scattered rhonchi expansion is equal Skin: no rash seen on limited exam Musculoskeletal: No gross abnormality Psychiatric:unable to assess Neurologic:no involuntary movements         Lab Data:   Basic Metabolic Panel: Recent Labs  Lab 06/10/21 2342 06/12/21 0139 06/14/21 0504 06/15/21 0418 06/16/21 0554  NA 140 138 144 146* 151*  K 3.5 4.4 3.7 3.6 3.6  CL 104 103 105 107 107  CO2 21* 23 25 25 26   GLUCOSE 131* 330* 185* 134* 301*  BUN 102* 116* 126* 124* 133*  CREATININE 2.21* 2.64* 2.53* 2.55* 2.70*  CALCIUM 10.4* 10.1 10.4* 10.5* 10.6*  MG  --   --   --  2.1 2.3  PHOS  --  5.5* 6.4* 5.6* 5.2*    ABG: Recent Labs  Lab 06/11/21 0130  PHART 7.392  PCO2ART 36.6  PO2ART 88.8  HCO3 21.9  O2SAT 97.4    Liver Function Tests: Recent Labs  Lab 06/10/21 2342 06/12/21 0139 06/14/21 0504 06/15/21 0418 06/16/21 0554  AST 16  --   --   --   --   ALT 14  --   --   --   --   ALKPHOS 63  --   --   --   --   BILITOT 0.6  --   --   --   --   PROT 6.8  --   --   --   --   ALBUMIN 3.0* 2.9* 3.0* 2.8* 2.9*   No results for input(s):  LIPASE, AMYLASE in the last 168 hours. Recent Labs  Lab 06/12/21 0139  AMMONIA 18    CBC: Recent Labs  Lab 06/10/21 2342 06/12/21 0139 06/14/21 0504 06/15/21 0418 06/16/21 0554  WBC 5.9 4.4 6.8 7.6 5.4  HGB 8.6* 7.9* 8.7* 8.1* 8.4*  HCT 28.1* 25.3* 27.1* 25.8* 26.2*  MCV 95.3 94.8 94.8 95.2 94.9  PLT 364 373 468* 385 259    Cardiac Enzymes: No results for input(s): CKTOTAL, CKMB, CKMBINDEX, TROPONINI in the last 168 hours.  BNP (last 3 results) No results for input(s): BNP in the last 8760 hours.  ProBNP (last 3 results) No results for input(s): PROBNP in the last 8760 hours.  Radiological Exams: DG CHEST PORT 1 VIEW  Result Date: 06/15/2021 CLINICAL DATA:  Sepsis. EXAM: PORTABLE CHEST 1 VIEW COMPARISON:  06/14/2018 FINDINGS: Dialysis catheter is identified with tip in the projection of the right atrium. Tracheostomy tube tip is above the carina. Stable cardiomediastinal contours. Pulmonary vascular congestion and right pleural effusion appear unchanged. IMPRESSION: No change in pulmonary vascular congestion and right pleural effusion. Electronically Signed   By: Kerby Moors M.D.   On: 06/15/2021 07:07    Assessment/Plan Active Problems:   Acute  on chronic respiratory failure with hypoxia (HCC)   Severe sepsis with septic shock (HCC)   Tracheostomy status (HCC)   Chronic heart failure with preserved ejection fraction (HCC)   AF (paroxysmal atrial fibrillation) (HCC)   Acute renal failure due to tubular necrosis (HCC)   Acute on chronic respiratory failure hypoxia we will continue to advance the weaning patient's volumes are excellent can possibly even go further than 2 hours Atrial fibrillation rate controlled we will continue to monitor on telemetry Severe sepsis with shock resolved hemodynamics are stable Chronic heart failure preserved ejection fraction supportive care Acute renal failure patient's lab work is looking good creatinine showing a slight  bump   I have personally seen and evaluated the patient, evaluated laboratory and imaging results, formulated the assessment and plan and placed orders. The Patient requires high complexity decision making with multiple systems involvement.  Rounds were done with the Respiratory Therapy Director and Staff therapists and discussed with nursing staff also.  [name], MD [facility] Pulmonary Critical Care Medicine Sleep Medicine

## 2021-06-17 ENCOUNTER — Other Ambulatory Visit (HOSPITAL_COMMUNITY): Payer: Medicare Other

## 2021-06-17 DIAGNOSIS — N170 Acute kidney failure with tubular necrosis: Secondary | ICD-10-CM | POA: Diagnosis not present

## 2021-06-17 DIAGNOSIS — I5032 Chronic diastolic (congestive) heart failure: Secondary | ICD-10-CM | POA: Diagnosis not present

## 2021-06-17 DIAGNOSIS — I480 Paroxysmal atrial fibrillation: Secondary | ICD-10-CM | POA: Diagnosis not present

## 2021-06-17 DIAGNOSIS — J9621 Acute and chronic respiratory failure with hypoxia: Secondary | ICD-10-CM | POA: Diagnosis not present

## 2021-06-17 LAB — RENAL FUNCTION PANEL
Albumin: 3 g/dL — ABNORMAL LOW (ref 3.5–5.0)
Anion gap: 14 (ref 5–15)
BUN: 137 mg/dL — ABNORMAL HIGH (ref 8–23)
CO2: 23 mmol/L (ref 22–32)
Calcium: 10.8 mg/dL — ABNORMAL HIGH (ref 8.9–10.3)
Chloride: 111 mmol/L (ref 98–111)
Creatinine, Ser: 2.31 mg/dL — ABNORMAL HIGH (ref 0.61–1.24)
GFR, Estimated: 31 mL/min — ABNORMAL LOW (ref >60)
Glucose, Bld: 192 mg/dL — ABNORMAL HIGH (ref 70–99)
Phosphorus: 4.4 mg/dL (ref 2.5–4.6)
Potassium: 3.5 mmol/L (ref 3.5–5.1)
Sodium: 148 mmol/L — ABNORMAL HIGH (ref 135–145)

## 2021-06-17 LAB — CBC
HCT: 30.5 % — ABNORMAL LOW (ref 39.0–52.0)
Hemoglobin: 9.6 g/dL — ABNORMAL LOW (ref 13.0–17.0)
MCH: 29.6 pg (ref 26.0–34.0)
MCHC: 31.5 g/dL (ref 30.0–36.0)
MCV: 94.1 fL (ref 80.0–100.0)
Platelets: 134 10*3/uL — ABNORMAL LOW (ref 150–400)
RBC: 3.24 MIL/uL — ABNORMAL LOW (ref 4.22–5.81)
RDW: 14.6 % (ref 11.5–15.5)
WBC: 7.5 10*3/uL (ref 4.0–10.5)
nRBC: 0.3 % — ABNORMAL HIGH (ref 0.0–0.2)

## 2021-06-17 LAB — MAGNESIUM: Magnesium: 2.2 mg/dL (ref 1.7–2.4)

## 2021-06-17 LAB — VANCOMYCIN, TROUGH: Vancomycin Tr: 6 ug/mL — ABNORMAL LOW (ref 15–20)

## 2021-06-17 LAB — LACTIC ACID, PLASMA: Lactic Acid, Venous: 2.5 mmol/L (ref 0.5–1.9)

## 2021-06-17 NOTE — Progress Notes (Signed)
Pulmonary [place]   PULMONARY CRITICAL CARE SERVICE  PROGRESS NOTE     Tony Young  NKN:[id number]  DOB: 1955/09/21   DOA: 06/10/2021  Referring Physician: Satira Sark, MD  HPI: Tony Young is a 66 y.o. male being followed for ventilator/airway/oxygen weaning Acute on Chronic Respiratory Failure.  Patient is comfortable without distress no fevers are noted at this time  Medications: Reviewed on Rounds  Physical Exam:  Vitals: Temperature is 96.7 pulse 117 respiratory 33 blood pressure is 145/78 saturations 94%  Ventilator Settings on assist control FiO2 28% tidal volume 581 PEEP 6  General: Comfortable at this time Neck: supple Cardiovascular: no malignant arrhythmias Respiratory: Scattered coarse rhonchi are noted bilaterally Skin: no rash seen on limited exam Musculoskeletal: No gross abnormality Psychiatric:unable to assess Neurologic:no involuntary movements         Lab Data:   Basic Metabolic Panel: Recent Labs  Lab 06/12/21 0139 06/14/21 0504 06/15/21 0418 06/16/21 0554 06/17/21 0611  NA 138 144 146* 151* 148*  K 4.4 3.7 3.6 3.6 3.5  CL 103 105 107 107 111  CO2 23 25 25 26 23   GLUCOSE 330* 185* 134* 301* 192*  BUN 116* 126* 124* 133* 137*  CREATININE 2.64* 2.53* 2.55* 2.70* 2.31*  CALCIUM 10.1 10.4* 10.5* 10.6* 10.8*  MG  --   --  2.1 2.3 2.2  PHOS 5.5* 6.4* 5.6* 5.2* 4.4    ABG: Recent Labs  Lab 06/11/21 0130  PHART 7.392  PCO2ART 36.6  PO2ART 88.8  HCO3 21.9  O2SAT 97.4    Liver Function Tests: Recent Labs  Lab 06/10/21 2342 06/12/21 0139 06/14/21 0504 06/15/21 0418 06/16/21 0554 06/17/21 0611  AST 16  --   --   --   --   --   ALT 14  --   --   --   --   --   ALKPHOS 63  --   --   --   --   --   BILITOT 0.6  --   --   --   --   --   PROT 6.8  --   --   --   --   --   ALBUMIN 3.0* 2.9* 3.0* 2.8* 2.9* 3.0*   No results for input(s): LIPASE, AMYLASE in the  last 168 hours. Recent Labs  Lab 06/12/21 0139  AMMONIA 18    CBC: Recent Labs  Lab 06/12/21 0139 06/14/21 0504 06/15/21 0418 06/16/21 0554 06/17/21 0611  WBC 4.4 6.8 7.6 5.4 7.5  HGB 7.9* 8.7* 8.1* 8.4* 9.6*  HCT 25.3* 27.1* 25.8* 26.2* 30.5*  MCV 94.8 94.8 95.2 94.9 94.1  PLT 373 468* 385 259 134*    Cardiac Enzymes: No results for input(s): CKTOTAL, CKMB, CKMBINDEX, TROPONINI in the last 168 hours.  BNP (last 3 results) No results for input(s): BNP in the last 8760 hours.  ProBNP (last 3 results) No results for input(s): PROBNP in the last 8760 hours.  Radiological Exams: No results found.  Assessment/Plan Active Problems:   Acute on chronic respiratory failure with hypoxia (HCC)   Severe sepsis with septic shock (HCC)   Tracheostomy status (HCC)   Chronic heart failure with preserved ejection fraction (HCC)   AF (paroxysmal atrial fibrillation) (HCC)   Acute renal failure due to tubular necrosis ([place])   Acute on chronic respiratory failure hypoxia plan try pressure support wean again for 4 hours today if patient is able to tolerate  we will have respiratory therapy continue to assess Severe sepsis with shock hemodynamics are stable we will continue to follow along closely. Chronic heart failure preserved ejection fraction we will continue with supportive care Atrial fibrillation rate is controlled Acute renal failure continue to monitor the patient's lab work closely creatinine is coming down today   I have personally seen and evaluated the patient, evaluated laboratory and imaging results, formulated the assessment and plan and placed orders. The Patient requires high complexity decision making with multiple systems involvement.  Rounds were done with the Respiratory Therapy Director and Staff therapists and discussed with nursing staff also.  [name], MD [facility] Pulmonary Critical Care Medicine Sleep Medicine

## 2021-06-18 ENCOUNTER — Other Ambulatory Visit (HOSPITAL_COMMUNITY): Payer: Medicare Other

## 2021-06-18 DIAGNOSIS — I480 Paroxysmal atrial fibrillation: Secondary | ICD-10-CM | POA: Diagnosis not present

## 2021-06-18 DIAGNOSIS — I5032 Chronic diastolic (congestive) heart failure: Secondary | ICD-10-CM | POA: Diagnosis not present

## 2021-06-18 DIAGNOSIS — N170 Acute kidney failure with tubular necrosis: Secondary | ICD-10-CM | POA: Diagnosis not present

## 2021-06-18 DIAGNOSIS — J9621 Acute and chronic respiratory failure with hypoxia: Secondary | ICD-10-CM | POA: Diagnosis not present

## 2021-06-18 LAB — CULTURE, RESPIRATORY W GRAM STAIN: Culture: NORMAL

## 2021-06-18 LAB — RENAL FUNCTION PANEL
Albumin: 2.9 g/dL — ABNORMAL LOW (ref 3.5–5.0)
Anion gap: 9 (ref 5–15)
BUN: 121 mg/dL — ABNORMAL HIGH (ref 8–23)
CO2: 25 mmol/L (ref 22–32)
Calcium: 10.1 mg/dL (ref 8.9–10.3)
Chloride: 113 mmol/L — ABNORMAL HIGH (ref 98–111)
Creatinine, Ser: 1.95 mg/dL — ABNORMAL HIGH (ref 0.61–1.24)
GFR, Estimated: 37 mL/min — ABNORMAL LOW (ref >60)
Glucose, Bld: 158 mg/dL — ABNORMAL HIGH (ref 70–99)
Phosphorus: 4.6 mg/dL (ref 2.5–4.6)
Potassium: 3.4 mmol/L — ABNORMAL LOW (ref 3.5–5.1)
Sodium: 147 mmol/L — ABNORMAL HIGH (ref 135–145)

## 2021-06-18 LAB — MAGNESIUM: Magnesium: 2.4 mg/dL (ref 1.7–2.4)

## 2021-06-18 LAB — TRIGLYCERIDES: Triglycerides: 117 mg/dL (ref <150)

## 2021-06-18 LAB — CBC
HCT: 31 % — ABNORMAL LOW (ref 39.0–52.0)
Hemoglobin: 9.4 g/dL — ABNORMAL LOW (ref 13.0–17.0)
MCH: 28.6 pg (ref 26.0–34.0)
MCHC: 30.3 g/dL (ref 30.0–36.0)
MCV: 94.2 fL (ref 80.0–100.0)
Platelets: 189 10*3/uL (ref 150–400)
RBC: 3.29 MIL/uL — ABNORMAL LOW (ref 4.22–5.81)
RDW: 14.5 % (ref 11.5–15.5)
WBC: 8.3 10*3/uL (ref 4.0–10.5)
nRBC: 0 % (ref 0.0–0.2)

## 2021-06-18 LAB — TSH: TSH: 5.119 u[IU]/mL — ABNORMAL HIGH (ref 0.350–4.500)

## 2021-06-18 LAB — LACTIC ACID, PLASMA: Lactic Acid, Venous: 2.4 mmol/L (ref 0.5–1.9)

## 2021-06-18 NOTE — Progress Notes (Signed)
Pulmonary [place]   PULMONARY CRITICAL CARE SERVICE  PROGRESS NOTE     Tony Young  [id number]:[id number]  DOB: 07/29/1955   DOA: 06/10/2021  Referring Physician: Satira Sark, MD  HPI: Tony Young is a 66 y.o. male being followed for ventilator/airway/oxygen weaning Acute on Chronic Respiratory Failure.  Patient is on full support on the ventilator on assist control mode has been on 28% FiO2  Medications: Reviewed on Rounds  Physical Exam:  Vitals: Temperature 96.4 pulse 115 respiratory 24 blood pressure is 112/96 saturations 98%  Ventilator Settings on assist control FiO2 28% tidal volume is 713 PEEP 6  General: Comfortable at this time Neck: supple Cardiovascular: no malignant arrhythmias Respiratory: No rhonchi no rales are noted Skin: no rash seen on limited exam Musculoskeletal: No gross abnormality Psychiatric:unable to assess Neurologic:no involuntary movements         Lab Data:   Basic Metabolic Panel: Recent Labs  Lab 06/14/21 0504 06/15/21 0418 06/16/21 0554 06/17/21 0611 06/18/21 0313  NA 144 146* 151* 148* 147*  K 3.7 3.6 3.6 3.5 3.4*  CL 105 107 107 111 113*  CO2 25 25 26 23 25   GLUCOSE 185* 134* 301* 192* 158*  BUN 126* 124* 133* 137* 121*  CREATININE 2.53* 2.55* 2.70* 2.31* 1.95*  CALCIUM 10.4* 10.5* 10.6* 10.8* 10.1  MG  --  2.1 2.3 2.2 2.4  PHOS 6.4* 5.6* 5.2* 4.4 4.6    ABG: No results for input(s): PHART, PCO2ART, PO2ART, HCO3, O2SAT in the last 168 hours.  Liver Function Tests: Recent Labs  Lab 06/14/21 0504 06/15/21 0418 06/16/21 0554 06/17/21 0611 06/18/21 0313  ALBUMIN 3.0* 2.8* 2.9* 3.0* 2.9*   No results for input(s): LIPASE, AMYLASE in the last 168 hours. Recent Labs  Lab 06/12/21 0139  AMMONIA 18    CBC: Recent Labs  Lab 06/14/21 0504 06/15/21 0418 06/16/21 0554 06/17/21 0611 06/18/21 0313  WBC 6.8 7.6 5.4 7.5 8.3  HGB 8.7* 8.1* 8.4* 9.6*  9.4*  HCT 27.1* 25.8* 26.2* 30.5* 31.0*  MCV 94.8 95.2 94.9 94.1 94.2  PLT 468* 385 259 134* 189    Cardiac Enzymes: No results for input(s): CKTOTAL, CKMB, CKMBINDEX, TROPONINI in the last 168 hours.  BNP (last 3 results) No results for input(s): BNP in the last 8760 hours.  ProBNP (last 3 results) No results for input(s): PROBNP in the last 8760 hours.  Radiological Exams: DG Chest Port 1 View  Result Date: 06/18/2021 CLINICAL DATA:  Evaluate pneumonia. EXAM: PORTABLE CHEST 1 VIEW COMPARISON:  06/17/2021 FINDINGS: Tracheostomy tube tip is above the carina. There is a IJ right large bore central venous catheter with tip projecting over the cavoatrial junction. Stable cardiomediastinal contours. Asymmetric opacity within the right midlung and right base appears unchanged. No signs of pleural effusion IMPRESSION: 1. Stable exam. 2. Persistent asymmetric opacity within the right midlung and right base. Electronically Signed   By: Kerby Moors M.D.   On: 06/18/2021 06:13   DG CHEST PORT 1 VIEW  Result Date: 06/17/2021 CLINICAL DATA:  Respiratory failure, pneumonia, sepsis. EXAM: PORTABLE CHEST 1 VIEW COMPARISON:  06/15/2021 FINDINGS: Tracheostomy tube projects over the tracheal air shadow. Right internal jugular dialysis catheter tip: Right atrium. Continued mild interstitial indistinctness bilaterally with hazy airspace opacity in the right mid lung and right lung base, similar to the 06/15/2021 exam. Thoracic spondylosis. Borderline enlargement of the cardiopericardial silhouette. IMPRESSION: 1. Interstitial accentuation with indistinct vasculature and persistent hazy right  mid lung and right basilar airspace opacity. Appearance suggests pneumonia or asymmetric edema. Electronically Signed   By: Van Clines M.D.   On: 06/17/2021 14:00    Assessment/Plan Active Problems:   Acute on chronic respiratory failure with hypoxia (HCC)   Severe sepsis with septic shock (HCC)   Tracheostomy  status (HCC)   Chronic heart failure with preserved ejection fraction (HCC)   AF (paroxysmal atrial fibrillation) (HCC)   Acute renal failure due to tubular necrosis (HCC)   Acute on chronic respiratory failure with hypoxia we will continue on the weaning protocol goal is 8 hours of pressure support weaning for today Severe sepsis with shock resolved hemodynamics are stable Chronic heart failure preserved ejection fraction we will continue to follow along closely monitor fluid status very closely Atrial fibrillation rate is controlled at this time we will continue to follow along Acute renal failure supportive care   I have personally seen and evaluated the patient, evaluated laboratory and imaging results, formulated the assessment and plan and placed orders. The Patient requires high complexity decision making with multiple systems involvement.  Rounds were done with the Respiratory Therapy Director and Staff therapists and discussed with nursing staff also.  [name], MD [facility] Pulmonary Critical Care Medicine Sleep Medicine

## 2021-06-19 DIAGNOSIS — I480 Paroxysmal atrial fibrillation: Secondary | ICD-10-CM | POA: Diagnosis not present

## 2021-06-19 DIAGNOSIS — J9621 Acute and chronic respiratory failure with hypoxia: Secondary | ICD-10-CM | POA: Diagnosis not present

## 2021-06-19 DIAGNOSIS — N170 Acute kidney failure with tubular necrosis: Secondary | ICD-10-CM | POA: Diagnosis not present

## 2021-06-19 DIAGNOSIS — I5032 Chronic diastolic (congestive) heart failure: Secondary | ICD-10-CM | POA: Diagnosis not present

## 2021-06-19 LAB — CBC
HCT: 29.5 % — ABNORMAL LOW (ref 39.0–52.0)
Hemoglobin: 9.2 g/dL — ABNORMAL LOW (ref 13.0–17.0)
MCH: 29.3 pg (ref 26.0–34.0)
MCHC: 31.2 g/dL (ref 30.0–36.0)
MCV: 93.9 fL (ref 80.0–100.0)
Platelets: 162 10*3/uL (ref 150–400)
RBC: 3.14 MIL/uL — ABNORMAL LOW (ref 4.22–5.81)
RDW: 14.6 % (ref 11.5–15.5)
WBC: 8.3 10*3/uL (ref 4.0–10.5)
nRBC: 0.2 % (ref 0.0–0.2)

## 2021-06-19 LAB — RENAL FUNCTION PANEL
Albumin: 2.9 g/dL — ABNORMAL LOW (ref 3.5–5.0)
Anion gap: 12 (ref 5–15)
BUN: 115 mg/dL — ABNORMAL HIGH (ref 8–23)
CO2: 24 mmol/L (ref 22–32)
Calcium: 10.7 mg/dL — ABNORMAL HIGH (ref 8.9–10.3)
Chloride: 113 mmol/L — ABNORMAL HIGH (ref 98–111)
Creatinine, Ser: 1.85 mg/dL — ABNORMAL HIGH (ref 0.61–1.24)
GFR, Estimated: 40 mL/min — ABNORMAL LOW
Glucose, Bld: 164 mg/dL — ABNORMAL HIGH (ref 70–99)
Phosphorus: 4.4 mg/dL (ref 2.5–4.6)
Potassium: 4 mmol/L (ref 3.5–5.1)
Sodium: 149 mmol/L — ABNORMAL HIGH (ref 135–145)

## 2021-06-19 LAB — MAGNESIUM: Magnesium: 2.3 mg/dL (ref 1.7–2.4)

## 2021-06-19 LAB — LACTIC ACID, PLASMA: Lactic Acid, Venous: 2.2 mmol/L (ref 0.5–1.9)

## 2021-06-19 NOTE — Progress Notes (Signed)
Pulmonary [place]   PULMONARY CRITICAL CARE SERVICE  PROGRESS NOTE     Kadden Osterhout  FWY:[id number]  DOB: 12-05-1955   DOA: 06/10/2021  Referring Physician: Satira Sark, MD  HPI: Tony Young is a 66 y.o. male being followed for ventilator/airway/oxygen weaning Acute on Chronic Respiratory Failure.  Patient is on pressure support has been on 28% FiO2 we will try to start T collar today  Medications: Reviewed on Rounds  Physical Exam:  Vitals: Temperature is 98.3 pulse 111 respiratory 23 blood pressure is 144/80 saturations 99%  Ventilator Settings on pressure support FiO2 is 28% pressure 12/5  General: Comfortable at this time Neck: supple Cardiovascular: no malignant arrhythmias Respiratory: Scattered rhonchi expansion is equal Skin: no rash seen on limited exam Musculoskeletal: No gross abnormality Psychiatric:unable to assess Neurologic:no involuntary movements         Lab Data:   Basic Metabolic Panel: Recent Labs  Lab 06/15/21 0418 06/16/21 0554 06/17/21 0611 06/18/21 0313 06/19/21 0440  NA 146* 151* 148* 147* 149*  K 3.6 3.6 3.5 3.4* 4.0  CL 107 107 111 113* 113*  CO2 25 26 23 25 24   GLUCOSE 134* 301* 192* 158* 164*  BUN 124* 133* 137* 121* 115*  CREATININE 2.55* 2.70* 2.31* 1.95* 1.85*  CALCIUM 10.5* 10.6* 10.8* 10.1 10.7*  MG 2.1 2.3 2.2 2.4 2.3  PHOS 5.6* 5.2* 4.4 4.6 4.4    ABG: No results for input(s): PHART, PCO2ART, PO2ART, HCO3, O2SAT in the last 168 hours.  Liver Function Tests: Recent Labs  Lab 06/15/21 0418 06/16/21 0554 06/17/21 0611 06/18/21 0313 06/19/21 0440  ALBUMIN 2.8* 2.9* 3.0* 2.9* 2.9*   No results for input(s): LIPASE, AMYLASE in the last 168 hours. No results for input(s): AMMONIA in the last 168 hours.  CBC: Recent Labs  Lab 06/15/21 0418 06/16/21 0554 06/17/21 0611 06/18/21 0313 06/19/21 0440  WBC 7.6 5.4 7.5 8.3 8.3  HGB 8.1* 8.4* 9.6*  9.4* 9.2*  HCT 25.8* 26.2* 30.5* 31.0* 29.5*  MCV 95.2 94.9 94.1 94.2 93.9  PLT 385 259 134* 189 162    Cardiac Enzymes: No results for input(s): CKTOTAL, CKMB, CKMBINDEX, TROPONINI in the last 168 hours.  BNP (last 3 results) No results for input(s): BNP in the last 8760 hours.  ProBNP (last 3 results) No results for input(s): PROBNP in the last 8760 hours.  Radiological Exams: DG Chest Port 1 View  Result Date: 06/18/2021 CLINICAL DATA:  Evaluate pneumonia. EXAM: PORTABLE CHEST 1 VIEW COMPARISON:  06/17/2021 FINDINGS: Tracheostomy tube tip is above the carina. There is a IJ right large bore central venous catheter with tip projecting over the cavoatrial junction. Stable cardiomediastinal contours. Asymmetric opacity within the right midlung and right base appears unchanged. No signs of pleural effusion IMPRESSION: 1. Stable exam. 2. Persistent asymmetric opacity within the right midlung and right base. Electronically Signed   By: Kerby Moors M.D.   On: 06/18/2021 06:13    Assessment/Plan Active Problems:   Acute on chronic respiratory failure with hypoxia (HCC)   Severe sepsis with septic shock (HCC)   Tracheostomy status (HCC)   Chronic heart failure with preserved ejection fraction (HCC)   AF (paroxysmal atrial fibrillation) (HCC)   Acute renal failure due to tubular necrosis (HCC)   Acute on chronic respiratory failure with hypoxia we will continue with the wean to our T collar as the goal today Acute renal failure patient's overall creatinine lab work is improving creatinine  down to 1.85 now Chronic heart failure preserved ejection fraction monitor fluid status closely the last chest x-ray showed no pleural effusions and stable exam Atrial fibrillation rate is controlled Severe sepsis with shock resolved hemodynamics are stable   I have personally seen and evaluated the patient, evaluated laboratory and imaging results, formulated the assessment and plan and placed  orders. The Patient requires high complexity decision making with multiple systems involvement.  Rounds were done with the Respiratory Therapy Director and Staff therapists and discussed with nursing staff also.  [name], MD [facility] Pulmonary Critical Care Medicine Sleep Medicine

## 2021-06-19 NOTE — Consult Note (Addendum)
Infectious Disease Progress Note   Tony Young  MKL:[id number]  DOB: 1955-10-17  DOA: 06/10/2021  Brief History: [name] is an 66 y.o. male with medical history significant for COPD, congestive heart failure, diabetes mellitus, coronary artery disease who was admitted to the acute facility on 05/13/2021 with complaints of worsening shortness of breath, fever, found to have necrotizing fasciitis of the left lower extremity.  He was started on broad-spectrum antimicrobials. He underwent multiple debridements.  Hospital course was complicated by acute kidney injury needing dialysis, respiratory failure, sepsis with shock, GI bleed, bacteremia.  He was seen by GI, wound care, cardiology, general surgery, nephrology, infectious disease.  He was initially treated with IV vancomycin.  However, due to the renal failure vancomycin was discontinued and he was started on doxycycline.  He also had respiratory failure due to sepsis and shock and was placed on mechanical ventilation and ended up with a tracheostomy.  He was eventually stabilized and transferred to [facility].  After admission here he apparently had fever of 102.3.  Review of Systems:  patient is encephalopathic and confused.  Unable to obtain review of systems at this time.  Physical Exam: Vitals: Temperature 98.3, heart rate 111, respiratory 23, blood pressure 144/80, oxygen saturation 99%  Constitutional: Ill-appearing male, encephalopathic Head: Atraumatic, normocephalic Eyes: PERLA ENMT: external ears and nose appear normal, normal hearing, no ear or nose lesions, moist oral mucosa            Neck: Has trach in place CVS: S1-S2   Respiratory: Coarse breath sounds, scattered rhonchi Abdomen: soft nontender, nondistended, normal bowel sounds  Musculoskeletal: Lower extremity edema  Neuro: He is confused, remains on the vent, unable to do a neurologic exam at this time Psych: judgement and  insight appear normal, stable mood and affect, mental status Skin: no rashes, unstageable sacrococcygeal pressure ulcer, left lower extremity wound with dressing  Data reviewed:  I have personally reviewed following labs and imaging studies Labs:  CBC: Recent Labs  Lab 06/15/21 0418 06/16/21 0554 06/17/21 0611 06/18/21 0313 06/19/21 0440  WBC 7.6 5.4 7.5 8.3 8.3  HGB 8.1* 8.4* 9.6* 9.4* 9.2*  HCT 25.8* 26.2* 30.5* 31.0* 29.5*  MCV 95.2 94.9 94.1 94.2 93.9  PLT 385 259 134* 189 697    Basic Metabolic Panel: Recent Labs  Lab 06/15/21 0418 06/16/21 0554 06/17/21 0611 06/18/21 0313 06/19/21 0440  NA 146* 151* 148* 147* 149*  K 3.6 3.6 3.5 3.4* 4.0  CL 107 107 111 113* 113*  CO2 25 26 23 25 24   GLUCOSE 134* 301* 192* 158* 164*  BUN 124* 133* 137* 121* 115*  CREATININE 2.55* 2.70* 2.31* 1.95* 1.85*  CALCIUM 10.5* 10.6* 10.8* 10.1 10.7*  MG 2.1 2.3 2.2 2.4 2.3  PHOS 5.6* 5.2* 4.4 4.6 4.4   GFR CrCl cannot be calculated (Unknown ideal weight.). Liver Function Tests: Recent Labs  Lab 06/15/21 0418 06/16/21 0554 06/17/21 0611 06/18/21 0313 06/19/21 0440  ALBUMIN 2.8* 2.9* 3.0* 2.9* 2.9*   No results for input(s): LIPASE, AMYLASE in the last 168 hours. No results for input(s): AMMONIA in the last 168 hours. Coagulation profile No results for input(s): INR, PROTIME in the last 168 hours.  Cardiac Enzymes: No results for input(s): CKTOTAL, CKMB, CKMBINDEX, TROPONINI in the last 168 hours. BNP: Invalid input(s): POCBNP CBG: No results for input(s): GLUCAP in the last 168 hours. D-Dimer No results for input(s): DDIMER in  the last 72 hours. Hgb A1c No results for input(s): HGBA1C in the last 72 hours. Lipid Profile Recent Labs    06/18/21 0313  TRIG 117   Thyroid function studies Recent Labs    06/18/21 0313  TSH 5.119*   Anemia work up No results for input(s): VITAMINB12, FOLATE, FERRITIN, TIBC, IRON, RETICCTPCT in the last 72 hours. Urinalysis     Component Value Date/Time   COLORURINE YELLOW 06/15/2021 1410   APPEARANCEUR HAZY (A) 06/15/2021 1410   LABSPEC 1.020 06/15/2021 1410   PHURINE 5.5 06/15/2021 1410   GLUCOSEU NEGATIVE 06/15/2021 1410   HGBUR LARGE (A) 06/15/2021 1410   BILIRUBINUR NEGATIVE 06/15/2021 1410   KETONESUR NEGATIVE 06/15/2021 1410   PROTEINUR NEGATIVE 06/15/2021 1410   NITRITE NEGATIVE 06/15/2021 1410   LEUKOCYTESUR NEGATIVE 06/15/2021 1410   Sepsis Labs Invalid input(s): PROCALCITONIN,  WBC,  LACTICIDVEN Microbiology Recent Results (from the past 240 hour(s))  Culture, Respiratory w Gram Stain     Status: None   Collection Time: 06/11/21  4:30 PM   Specimen: Tracheal Aspirate; Respiratory  Result Value Ref Range Status   Specimen Description TRACHEAL ASPIRATE  Final   Special Requests NONE  Final   Gram Stain   Final    MODERATE WBC PRESENT,BOTH PMN AND MONONUCLEAR RARE GRAM POSITIVE COCCI RARE GRAM POSITIVE RODS    Culture   Final    RARE Consistent with normal respiratory flora. No Pseudomonas species isolated Performed at [facility] [address] [place], [place] [address]    Report Status 06/13/2021 FINAL  Final  Urine Culture     Status: None   Collection Time: 06/15/21  6:02 AM   Specimen: Urine, Catheterized  Result Value Ref Range Status   Specimen Description URINE, CATHETERIZED  Final   Special Requests NONE  Final   Culture   Final    NO GROWTH Performed at [facility] [address], [place], [place] [address]    Report Status 06/16/2021 FINAL  Final  Culture, blood (routine x 2)     Status: None (Preliminary result)   Collection Time: 06/15/21  6:40 AM   Specimen: Left Antecubital; Blood  Result Value Ref Range Status   Specimen Description LEFT ANTECUBITAL  Final   Special Requests   Final    BOTTLES DRAWN AEROBIC AND ANAEROBIC Blood Culture adequate volume   Culture   Final    NO GROWTH 4 DAYS Performed at [facility], [place] [address]  [place], [place] [address]    Report Status PENDING  Incomplete  Culture, blood (routine x 2)     Status: None (Preliminary result)   Collection Time: 06/15/21  6:53 AM   Specimen: Left Antecubital; Blood  Result Value Ref Range Status   Specimen Description LEFT ANTECUBITAL  Final   Special Requests   Final    BOTTLES DRAWN AEROBIC AND ANAEROBIC Blood Culture adequate volume   Culture   Final    NO GROWTH 4 DAYS Performed at [facility], [place] [address], [place], [place] [address]    Report Status PENDING  Incomplete  Culture, Respiratory w Gram Stain     Status: None   Collection Time: 06/15/21  8:01 AM   Specimen: Tracheal Aspirate; Respiratory  Result Value Ref Range Status   Specimen Description TRACHEAL ASPIRATE  Final   Special Requests NONE  Final   Gram Stain   Final    FEW SQUAMOUS EPITHELIAL CELLS PRESENT  MODERATE WBC PRESENT,BOTH PMN AND MONONUCLEAR RARE YEAST FEW GRAM POSITIVE COCCI RARE GRAM NEGATIVE RODS    Culture   Final    FEW Consistent with normal respiratory flora. No Pseudomonas species isolated Performed at [facility] [address] [place], [place] [address]    Report Status 06/18/2021 FINAL  Final  C Difficile Quick Screen (NO PCR Reflex)     Status: None   Collection Time: 06/16/21 10:51 AM   Specimen: STOOL  Result Value Ref Range Status   C Diff antigen NEGATIVE NEGATIVE Final   C Diff toxin NEGATIVE NEGATIVE Final   C Diff interpretation No C. difficile detected.  Final    Comment: Performed at [facility], [place] [address] [place], [place] [address]    Inpatient Medications:   Scheduled Meds: Please see MAR   Radiological Exams on Admission: DG Chest Port 1 View  Result Date: 06/18/2021 CLINICAL DATA:  Evaluate pneumonia. EXAM: PORTABLE CHEST 1 VIEW COMPARISON:  06/17/2021 FINDINGS: Tracheostomy tube tip is above the carina. There is a IJ right large bore central venous catheter with tip projecting over the cavoatrial  junction. Stable cardiomediastinal contours. Asymmetric opacity within the right midlung and right base appears unchanged. No signs of pleural effusion IMPRESSION: 1. Stable exam. 2. Persistent asymmetric opacity within the right midlung and right base. Electronically Signed   By: Kerby Moors M.D.   On: 06/18/2021 06:13    Impression/Recommendations Active Problems: Acute on chronic hypoxemic respiratory failure, ventilator dependent Pneumonia Fever Left lower extremity necrotizing fasciitis Sepsis with shock, currently shock resolved Pulmonary edema/pleural effusion Acute kidney injury Anemia/GI bleed Diabetes mellitus type 2 Congestive heart failure, systolic with EF 88-89% Atrial fibrillation Encephalopathy, likely toxic/metabolic Sacrococcygeal pressure ulcer, unstageable Dysphagia/protein calorie malnutrition History of colon cancer status post resection History of NSTEMI  Acute on chronic hypoxemic respiratory failure: This was in the setting of sepsis with septic shock and organ dysfunction.  He continues to be on the ventilator.  Chest x-ray showed findings concerning for pneumonia.  Recommended to send for respiratory cultures.  Respiratory cultures from 06/15/2021 resulted as normal respiratory flora which is highly concerning for aspiration.  He was previously on doxycycline, Flagyl.  However, due to the fevers and pneumonia broadened to meropenem.  Although he has an allergy listed to penicillin, he seems to be tolerating the meropenem.  Plan treat for tentative duration of 1 week pending improvement.   However, if he starts having worsening respiratory failure or continues to have worsening fevers or leukocytosis then recommend repeat chest imaging preferably chest CT which can be done without contrast given the recent renal failure.  As mentioned above, he has dysphagia and high risk for ongoing aspiration and recurrent aspiration pneumonia despite being on antibiotics.  Please  monitor BUN/creatinine, CBC closely while on antibiotics.  Pneumonia: Prior chest x-ray showed findings concerning for pneumonia. Antibiotics and plan as mentioned above.  Unfortunately he has dysphagia and high risk for aspiration and recurrent aspiration pneumonia despite being on antibiotics.  Left lower extremity necrotizing fasciitis: He also had sepsis secondary to the necrotizing fasciitis at the acute facility with shock.  Currently off pressors and shock is resolved.  He underwent multiple debridements at the acute facility.  Continue local wound care.  On antibiotics as mentioned above.  Initial plan was for the antibiotics to end on 06/20/2021.  However, in the setting of fever and concern for pneumonia now extended to tentative end date 06/22/2021 pending  improvement.    Fever: Multifactorial etiology.  He has left lower extremity necrotizing fasciitis status post debridement.  He also has unstageable sacrococcygeal pressure ulcer and has findings concerning for pneumonia on the chest imaging.  Antibiotics as mentioned above.  Respiratory culture showing normal flora concerning for aspiration.  Fortunately blood cultures remain negative to date.  Final report pending at this time.  Continue to monitor.  Sepsis with septic shock: He was on pressors and broad-spectrum antimicrobials at the acute facility.  The sepsis with shock was secondary to left lower extremity necrotizing fasciitis.  He underwent multiple debridements.  Currently shock is resolved.  However, he is high risk for recurrent sepsis.  On antibiotics as mentioned above.  If he starts having any worsening fevers or worsening leukocytosis recommend to repeat imaging, CT of the chest/abdomen/pelvis.  Pulmonary edema/pleural effusion: IR consulted for thoracentesis.  On IV Lasix.  Further management per the primary team.  Acute kidney injury: Patient apparently had acute kidney injury likely secondary to ATN in the setting of septic  shock.  He underwent CRRT/hemodialysis at the acute facility.  Nephrology consulted by the primary team.  Currently hemodialysis is on hold.  He is on IV Lasix for diuresis.  He apparently was on IV vancomycin at the acute facility which had to be discontinued due to the AKI and currently on doxycycline.  Would avoid IV vancomycin in this patient with renal failure. Continue to monitor BUN/creatinine closely.  Further management per primary team.  Avoid nephrotoxic medications.  Anemia/GI bleed: He is status post PRBCs at the outside hospital.  Continue to monitor hemoglobin.  Further investigation and management per primary team.  Diabetes mellitus type 2: Continue medication and management per primary team.  Will need proper glycemic control in order to enable wound healing.  Congestive heart failure: Likely systolic.  He had an echo at the acute facility with reported EF 30-35% with mild to moderate mitral regurgitation, reduced ventricular function.  On IV Lasix.  Continue medications and management per the primary team.  Atrial fibrillation: Continue medications and management per primary team.  He is on amiodarone and metoprolol.  Encephalopathy: Likely toxic/metabolic in the setting of septic shock with necrotizing fasciitis.  Head CT at the acute facility was negative.  On antibiotics as mentioned above.  Continue to monitor.  Further management per the primary team.  Sacrococcygeal pressure ulcer: Patient unfortunately due to his bedbound status and immobility developing sacrococcygeal pressure ulcer, currently unstageable.  On antibiotics as mentioned above.  Continue to monitor.  Continue local wound care.  If worsening, consider consulting surgery to evaluate for debridement.  Dysphagia/protein calorie malnutrition: Due to the dysphagia he is high risk for recurrent aspiration and aspiration pneumonia.  Management of protein calorie malnutrition per the primary team.  History of colon  cancer s/p resection: Follow-up outpatient.  NSTEMI: On aspirin.  Further management per the primary team.  Unfortunately due to his multiple complex medical problems he is high risk for worsening and decompensation.  Plan of care discussed with the primary team and pharmacy.  Yaakov Guthrie M.D. 06/19/2021, 11:01 PM

## 2021-06-20 DIAGNOSIS — Z930 Tracheostomy status: Secondary | ICD-10-CM

## 2021-06-20 DIAGNOSIS — I5032 Chronic diastolic (congestive) heart failure: Secondary | ICD-10-CM

## 2021-06-20 DIAGNOSIS — J9621 Acute and chronic respiratory failure with hypoxia: Secondary | ICD-10-CM

## 2021-06-20 DIAGNOSIS — I480 Paroxysmal atrial fibrillation: Secondary | ICD-10-CM | POA: Diagnosis not present

## 2021-06-20 DIAGNOSIS — N170 Acute kidney failure with tubular necrosis: Secondary | ICD-10-CM

## 2021-06-20 DIAGNOSIS — A419 Sepsis, unspecified organism: Secondary | ICD-10-CM

## 2021-06-20 DIAGNOSIS — R6521 Severe sepsis with septic shock: Secondary | ICD-10-CM

## 2021-06-20 LAB — RENAL FUNCTION PANEL
Albumin: 2.7 g/dL — ABNORMAL LOW (ref 3.5–5.0)
Anion gap: 8 (ref 5–15)
BUN: 107 mg/dL — ABNORMAL HIGH (ref 8–23)
CO2: 24 mmol/L (ref 22–32)
Calcium: 10.2 mg/dL (ref 8.9–10.3)
Chloride: 113 mmol/L — ABNORMAL HIGH (ref 98–111)
Creatinine, Ser: 1.8 mg/dL — ABNORMAL HIGH (ref 0.61–1.24)
GFR, Estimated: 41 mL/min — ABNORMAL LOW (ref >60)
Glucose, Bld: 155 mg/dL — ABNORMAL HIGH (ref 70–99)
Phosphorus: 5.1 mg/dL — ABNORMAL HIGH (ref 2.5–4.6)
Potassium: 4 mmol/L (ref 3.5–5.1)
Sodium: 145 mmol/L (ref 135–145)

## 2021-06-20 LAB — CULTURE, BLOOD (ROUTINE X 2)
Culture: NO GROWTH
Culture: NO GROWTH
Special Requests: ADEQUATE
Special Requests: ADEQUATE

## 2021-06-20 LAB — LACTIC ACID, PLASMA: Lactic Acid, Venous: 2.1 mmol/L (ref 0.5–1.9)

## 2021-06-20 LAB — CBC
HCT: 27.6 % — ABNORMAL LOW (ref 39.0–52.0)
Hemoglobin: 8.5 g/dL — ABNORMAL LOW (ref 13.0–17.0)
MCH: 28.9 pg (ref 26.0–34.0)
MCHC: 30.8 g/dL (ref 30.0–36.0)
MCV: 93.9 fL (ref 80.0–100.0)
Platelets: 120 10*3/uL — ABNORMAL LOW (ref 150–400)
RBC: 2.94 MIL/uL — ABNORMAL LOW (ref 4.22–5.81)
RDW: 14.3 % (ref 11.5–15.5)
WBC: 8.7 10*3/uL (ref 4.0–10.5)
nRBC: 0.2 % (ref 0.0–0.2)

## 2021-06-20 LAB — MAGNESIUM: Magnesium: 2.4 mg/dL (ref 1.7–2.4)

## 2021-06-20 NOTE — Progress Notes (Signed)
Pulmonary [place]   PULMONARY CRITICAL CARE SERVICE  PROGRESS NOTE     Tony Young  QPR:[id number]  DOB: 1956-02-22   DOA: 06/10/2021  Referring Physician: Satira Sark, MD  HPI: Tony Young is a 66 y.o. male being followed for ventilator/airway/oxygen weaning Acute on Chronic Respiratory Failure.  Patient is on T-piece today's goal is for 4 hours has been on 28% FiO2  Medications: Reviewed on Rounds  Physical Exam:  Vitals: Temperature 97.3 pulse 107 respiratory rate is 27 blood pressure is 142/79 saturations 100%  Ventilator Settings off the ventilator on T-piece for 4 hours  General: Comfortable at this time Neck: supple Cardiovascular: no malignant arrhythmias Respiratory: No rhonchi no rales are noted Skin: no rash seen on limited exam Musculoskeletal: No gross abnormality Psychiatric:unable to assess Neurologic:no involuntary movements         Lab Data:   Basic Metabolic Panel: Recent Labs  Lab 06/16/21 0554 06/17/21 0611 06/18/21 0313 06/19/21 0440 06/20/21 0343  NA 151* 148* 147* 149* 145  K 3.6 3.5 3.4* 4.0 4.0  CL 107 111 113* 113* 113*  CO2 26 23 25 24 24   GLUCOSE 301* 192* 158* 164* 155*  BUN 133* 137* 121* 115* 107*  CREATININE 2.70* 2.31* 1.95* 1.85* 1.80*  CALCIUM 10.6* 10.8* 10.1 10.7* 10.2  MG 2.3 2.2 2.4 2.3 2.4  PHOS 5.2* 4.4 4.6 4.4 5.1*    ABG: No results for input(s): PHART, PCO2ART, PO2ART, HCO3, O2SAT in the last 168 hours.  Liver Function Tests: Recent Labs  Lab 06/16/21 0554 06/17/21 0611 06/18/21 0313 06/19/21 0440 06/20/21 0343  ALBUMIN 2.9* 3.0* 2.9* 2.9* 2.7*   No results for input(s): LIPASE, AMYLASE in the last 168 hours. No results for input(s): AMMONIA in the last 168 hours.  CBC: Recent Labs  Lab 06/16/21 0554 06/17/21 0611 06/18/21 0313 06/19/21 0440 06/20/21 0343  WBC 5.4 7.5 8.3 8.3 8.7  HGB 8.4* 9.6* 9.4* 9.2* 8.5*  HCT 26.2*  30.5* 31.0* 29.5* 27.6*  MCV 94.9 94.1 94.2 93.9 93.9  PLT 259 134* 189 162 120*    Cardiac Enzymes: No results for input(s): CKTOTAL, CKMB, CKMBINDEX, TROPONINI in the last 168 hours.  BNP (last 3 results) No results for input(s): BNP in the last 8760 hours.  ProBNP (last 3 results) No results for input(s): PROBNP in the last 8760 hours.  Radiological Exams: No results found.  Assessment/Plan Active Problems:   Acute on chronic respiratory failure with hypoxia (HCC)   Severe sepsis with septic shock (HCC)   Tracheostomy status (HCC)   Chronic heart failure with preserved ejection fraction (HCC)   AF (paroxysmal atrial fibrillation) (HCC)   Acute renal failure due to tubular necrosis (HCC)   Acute on chronic respiratory failure hypoxia we will continue with the T-piece wean as tolerated continue secretion management pulmonary toilet. Severe sepsis with shock hemodynamics are stable at this time Tracheostomy remains in place Chronic heart failure preserved ejection fraction supportive care monitor fluid status Acute renal failure improving labs   I have personally seen and evaluated the patient, evaluated laboratory and imaging results, formulated the assessment and plan and placed orders. The Patient requires high complexity decision making with multiple systems involvement.  Rounds were done with the Respiratory Therapy Director and Staff therapists and discussed with nursing staff also.  [name], MD [facility] Pulmonary Critical Care Medicine Sleep Medicine

## 2021-06-21 LAB — BASIC METABOLIC PANEL
Anion gap: 13 (ref 5–15)
BUN: 93 mg/dL — ABNORMAL HIGH (ref 8–23)
CO2: 22 mmol/L (ref 22–32)
Calcium: 10 mg/dL (ref 8.9–10.3)
Chloride: 110 mmol/L (ref 98–111)
Creatinine, Ser: 1.74 mg/dL — ABNORMAL HIGH (ref 0.61–1.24)
GFR, Estimated: 43 mL/min — ABNORMAL LOW (ref >60)
Glucose, Bld: 99 mg/dL (ref 70–99)
Potassium: 4.3 mmol/L (ref 3.5–5.1)
Sodium: 145 mmol/L (ref 135–145)

## 2021-06-21 LAB — TRIGLYCERIDES: Triglycerides: 109 mg/dL (ref <150)

## 2021-06-22 DIAGNOSIS — I5032 Chronic diastolic (congestive) heart failure: Secondary | ICD-10-CM | POA: Diagnosis not present

## 2021-06-22 DIAGNOSIS — N170 Acute kidney failure with tubular necrosis: Secondary | ICD-10-CM | POA: Diagnosis not present

## 2021-06-22 DIAGNOSIS — I480 Paroxysmal atrial fibrillation: Secondary | ICD-10-CM | POA: Diagnosis not present

## 2021-06-22 DIAGNOSIS — J9621 Acute and chronic respiratory failure with hypoxia: Secondary | ICD-10-CM | POA: Diagnosis not present

## 2021-06-22 NOTE — Progress Notes (Signed)
Pulmonary [place]   PULMONARY CRITICAL CARE SERVICE  PROGRESS NOTE     Thierno Hun  [id number]:[id number]  DOB: March 17, 1956   DOA: 06/10/2021  Referring Physician: Satira Sark, MD  HPI: Excell Neyland is a 66 y.o. male being followed for ventilator/airway/oxygen weaning Acute on Chronic Respiratory Failure.  Patient is on pressure support supposed to be doing T collar for 12 hours  Medications: Reviewed on Rounds  Physical Exam:  Vitals: Temperature is 97.1 pulse 111 respiratory rate is 34 blood pressure 153/76 saturations 100%  Ventilator Settings currently on pressure support 10/5 volumes were over 600  General: Comfortable at this time Neck: supple Cardiovascular: no malignant arrhythmias Respiratory: No rhonchi no rales are noted at this time Skin: no rash seen on limited exam Musculoskeletal: No gross abnormality Psychiatric:unable to assess Neurologic:no involuntary movements         Lab Data:   Basic Metabolic Panel: Recent Labs  Lab 06/16/21 0554 06/17/21 0611 06/18/21 0313 06/19/21 0440 06/20/21 0343 06/21/21 0400  NA 151* 148* 147* 149* 145 145  K 3.6 3.5 3.4* 4.0 4.0 4.3  CL 107 111 113* 113* 113* 110  CO2 26 23 25 24 24 22   GLUCOSE 301* 192* 158* 164* 155* 99  BUN 133* 137* 121* 115* 107* 93*  CREATININE 2.70* 2.31* 1.95* 1.85* 1.80* 1.74*  CALCIUM 10.6* 10.8* 10.1 10.7* 10.2 10.0  MG 2.3 2.2 2.4 2.3 2.4  --   PHOS 5.2* 4.4 4.6 4.4 5.1*  --     ABG: No results for input(s): PHART, PCO2ART, PO2ART, HCO3, O2SAT in the last 168 hours.  Liver Function Tests: Recent Labs  Lab 06/16/21 0554 06/17/21 0611 06/18/21 0313 06/19/21 0440 06/20/21 0343  ALBUMIN 2.9* 3.0* 2.9* 2.9* 2.7*   No results for input(s): LIPASE, AMYLASE in the last 168 hours. No results for input(s): AMMONIA in the last 168 hours.  CBC: Recent Labs  Lab 06/16/21 0554 06/17/21 0611 06/18/21 0313  06/19/21 0440 06/20/21 0343  WBC 5.4 7.5 8.3 8.3 8.7  HGB 8.4* 9.6* 9.4* 9.2* 8.5*  HCT 26.2* 30.5* 31.0* 29.5* 27.6*  MCV 94.9 94.1 94.2 93.9 93.9  PLT 259 134* 189 162 120*    Cardiac Enzymes: No results for input(s): CKTOTAL, CKMB, CKMBINDEX, TROPONINI in the last 168 hours.  BNP (last 3 results) No results for input(s): BNP in the last 8760 hours.  ProBNP (last 3 results) No results for input(s): PROBNP in the last 8760 hours.  Radiological Exams: No results found.  Assessment/Plan Active Problems:   Acute on chronic respiratory failure with hypoxia (HCC)   Severe sepsis with septic shock (HCC)   Tracheostomy status (HCC)   Chronic heart failure with preserved ejection fraction (HCC)   AF (paroxysmal atrial fibrillation) (HCC)   Acute renal failure due to tubular necrosis (HCC)   Acute on chronic respiratory failure with hypoxia we will continue with the wean protocol T collar wean for 12 hours today Severe sepsis treated resolved hemodynamics are stable Chronic heart failure preserved ejection fraction patient is at baseline Atrial fibrillation rate is controlled Acute renal failure following patient's lab work closely   I have personally seen and evaluated the patient, evaluated laboratory and imaging results, formulated the assessment and plan and placed orders. The Patient requires high complexity decision making with multiple systems involvement.  Rounds were done with the Respiratory Therapy Director and Staff therapists and discussed with nursing staff also.  [name], MD  [facility] Pulmonary Critical Care Medicine Sleep Medicine

## 2021-06-23 DIAGNOSIS — N170 Acute kidney failure with tubular necrosis: Secondary | ICD-10-CM | POA: Diagnosis not present

## 2021-06-23 DIAGNOSIS — J9621 Acute and chronic respiratory failure with hypoxia: Secondary | ICD-10-CM | POA: Diagnosis not present

## 2021-06-23 DIAGNOSIS — I5032 Chronic diastolic (congestive) heart failure: Secondary | ICD-10-CM | POA: Diagnosis not present

## 2021-06-23 DIAGNOSIS — I480 Paroxysmal atrial fibrillation: Secondary | ICD-10-CM | POA: Diagnosis not present

## 2021-06-23 LAB — RENAL FUNCTION PANEL
Albumin: 2.9 g/dL — ABNORMAL LOW (ref 3.5–5.0)
Anion gap: 10 (ref 5–15)
BUN: 70 mg/dL — ABNORMAL HIGH (ref 8–23)
CO2: 23 mmol/L (ref 22–32)
Calcium: 9.8 mg/dL (ref 8.9–10.3)
Chloride: 111 mmol/L (ref 98–111)
Creatinine, Ser: 1.63 mg/dL — ABNORMAL HIGH (ref 0.61–1.24)
GFR, Estimated: 46 mL/min — ABNORMAL LOW (ref >60)
Glucose, Bld: 58 mg/dL — ABNORMAL LOW (ref 70–99)
Phosphorus: 5.1 mg/dL — ABNORMAL HIGH (ref 2.5–4.6)
Potassium: 3.6 mmol/L (ref 3.5–5.1)
Sodium: 144 mmol/L (ref 135–145)

## 2021-06-23 LAB — CBC
HCT: 30 % — ABNORMAL LOW (ref 39.0–52.0)
Hemoglobin: 9.3 g/dL — ABNORMAL LOW (ref 13.0–17.0)
MCH: 28.5 pg (ref 26.0–34.0)
MCHC: 31 g/dL (ref 30.0–36.0)
MCV: 92 fL (ref 80.0–100.0)
Platelets: 112 10*3/uL — ABNORMAL LOW (ref 150–400)
RBC: 3.26 MIL/uL — ABNORMAL LOW (ref 4.22–5.81)
RDW: 14.6 % (ref 11.5–15.5)
WBC: 11 10*3/uL — ABNORMAL HIGH (ref 4.0–10.5)
nRBC: 0 % (ref 0.0–0.2)

## 2021-06-23 LAB — MAGNESIUM: Magnesium: 2.3 mg/dL (ref 1.7–2.4)

## 2021-06-23 NOTE — Progress Notes (Signed)
Pulmonary [place]   PULMONARY CRITICAL CARE SERVICE  PROGRESS NOTE     Author Hatlestad  PXT:[id number]  DOB: July 15, 1955   DOA: 06/10/2021  Referring Physician: Satira Sark, MD  HPI: Tony Young is a 66 y.o. male being followed for ventilator/airway/oxygen weaning Acute on Chronic Respiratory Failure.  Patient is afebrile resting comfortably without distress at this time currently is on pressure support on 28% FiO2 supposed to do 16 hours of T-piece  Medications: Reviewed on Rounds  Physical Exam:  Vitals: Temperature is 98.5 pulse 99 respiratory rate is 18 blood pressure is 126/69 saturations 99%  Ventilator Settings pressure support FiO2 28% pressure 12/5 tidal volume is 576  General: Comfortable at this time Neck: supple Cardiovascular: no malignant arrhythmias Respiratory: No rhonchi no rales are noted at this time Skin: no rash seen on limited exam Musculoskeletal: No gross abnormality Psychiatric:unable to assess Neurologic:no involuntary movements         Lab Data:   Basic Metabolic Panel: Recent Labs  Lab 06/17/21 0611 06/18/21 0313 06/19/21 0440 06/20/21 0343 06/21/21 0400 06/23/21 0530  NA 148* 147* 149* 145 145 144  K 3.5 3.4* 4.0 4.0 4.3 3.6  CL 111 113* 113* 113* 110 111  CO2 23 25 24 24 22 23   GLUCOSE 192* 158* 164* 155* 99 58*  BUN 137* 121* 115* 107* 93* 70*  CREATININE 2.31* 1.95* 1.85* 1.80* 1.74* 1.63*  CALCIUM 10.8* 10.1 10.7* 10.2 10.0 9.8  MG 2.2 2.4 2.3 2.4  --  2.3  PHOS 4.4 4.6 4.4 5.1*  --  5.1*    ABG: No results for input(s): PHART, PCO2ART, PO2ART, HCO3, O2SAT in the last 168 hours.  Liver Function Tests: Recent Labs  Lab 06/17/21 0611 06/18/21 0313 06/19/21 0440 06/20/21 0343 06/23/21 0530  ALBUMIN 3.0* 2.9* 2.9* 2.7* 2.9*   No results for input(s): LIPASE, AMYLASE in the last 168 hours. No results for input(s): AMMONIA in the last 168  hours.  CBC: Recent Labs  Lab 06/17/21 0611 06/18/21 0313 06/19/21 0440 06/20/21 0343 06/23/21 0530  WBC 7.5 8.3 8.3 8.7 11.0*  HGB 9.6* 9.4* 9.2* 8.5* 9.3*  HCT 30.5* 31.0* 29.5* 27.6* 30.0*  MCV 94.1 94.2 93.9 93.9 92.0  PLT 134* 189 162 120* 112*    Cardiac Enzymes: No results for input(s): CKTOTAL, CKMB, CKMBINDEX, TROPONINI in the last 168 hours.  BNP (last 3 results) No results for input(s): BNP in the last 8760 hours.  ProBNP (last 3 results) No results for input(s): PROBNP in the last 8760 hours.  Radiological Exams: No results found.  Assessment/Plan Active Problems:   Acute on chronic respiratory failure with hypoxia (HCC)   Severe sepsis with septic shock (HCC)   Tracheostomy status (HCC)   Chronic heart failure with preserved ejection fraction (HCC)   AF (paroxysmal atrial fibrillation) (HCC)   Acute renal failure due to tubular necrosis (HCC)   Acute on chronic respiratory failure hypoxia wean on the T-piece 16-hour goal continue secretion management pulmonary toilet. Tracheostomy remains in place right now Severe sepsis with shock resolved hemodynamics are stable Chronic heart failure preserved ejection fraction patient is at baseline Atrial fibrillation rate is controlled at this time we will continue to monitor closely. Acute renal failure continue to monitor patient's lab work closely   I have personally seen and evaluated the patient, evaluated laboratory and imaging results, formulated the assessment and plan and placed orders. The Patient requires high complexity decision making  with multiple systems involvement.  Rounds were done with the Respiratory Therapy Director and Staff therapists and discussed with nursing staff also.  [name], MD [facility] Pulmonary Critical Care Medicine Sleep Medicine

## 2021-06-24 DIAGNOSIS — J9621 Acute and chronic respiratory failure with hypoxia: Secondary | ICD-10-CM | POA: Diagnosis not present

## 2021-06-24 DIAGNOSIS — N170 Acute kidney failure with tubular necrosis: Secondary | ICD-10-CM | POA: Diagnosis not present

## 2021-06-24 DIAGNOSIS — I480 Paroxysmal atrial fibrillation: Secondary | ICD-10-CM | POA: Diagnosis not present

## 2021-06-24 DIAGNOSIS — I5032 Chronic diastolic (congestive) heart failure: Secondary | ICD-10-CM | POA: Diagnosis not present

## 2021-06-24 LAB — BASIC METABOLIC PANEL
Anion gap: 13 (ref 5–15)
BUN: 77 mg/dL — ABNORMAL HIGH (ref 8–23)
CO2: 20 mmol/L — ABNORMAL LOW (ref 22–32)
Calcium: 9.8 mg/dL (ref 8.9–10.3)
Chloride: 108 mmol/L (ref 98–111)
Creatinine, Ser: 1.61 mg/dL — ABNORMAL HIGH (ref 0.61–1.24)
GFR, Estimated: 47 mL/min — ABNORMAL LOW (ref >60)
Glucose, Bld: 174 mg/dL — ABNORMAL HIGH (ref 70–99)
Potassium: 4.6 mmol/L (ref 3.5–5.1)
Sodium: 141 mmol/L (ref 135–145)

## 2021-06-24 LAB — TRIGLYCERIDES: Triglycerides: 139 mg/dL (ref <150)

## 2021-06-24 NOTE — Progress Notes (Signed)
Pulmonary [place]   PULMONARY CRITICAL CARE SERVICE  PROGRESS NOTE     Tony Young  [id number]  DOB: [birth year]   DOA: 06/10/2021  Referring Physician: Satira Sark, MD  HPI: Tony Young is a 66 y.o. male being followed for ventilator/airway/oxygen weaning Acute on Chronic Respiratory Failure.  No fevers are noted patient has been on pressure support supposed to be doing T collar today for over 16 hours again  Medications: Reviewed on Rounds  Physical Exam:  Vitals: Temperature 97.1 pulse 101 respiratory rate is 15 blood pressure is 129/75 saturations 99%  Ventilator Settings resting on pressure support FiO2 is 28% tidal volume 478 pressure 12/5  General: Comfortable at this time Neck: supple Cardiovascular: no malignant arrhythmias Respiratory: Coarse rhonchi expansion is equal Skin: no rash seen on limited exam Musculoskeletal: No gross abnormality Psychiatric:unable to assess Neurologic:no involuntary movements         Lab Data:   Basic Metabolic Panel: Recent Labs  Lab 06/18/21 0313 06/19/21 0440 06/20/21 0343 06/21/21 0400 06/23/21 0530 06/24/21 0416  NA 147* 149* 145 145 144 141  K 3.4* 4.0 4.0 4.3 3.6 4.6  CL 113* 113* 113* 110 111 108  CO2 25 24 24 22 23  20*  GLUCOSE 158* 164* 155* 99 58* 174*  BUN 121* 115* 107* 93* 70* 77*  CREATININE 1.95* 1.85* 1.80* 1.74* 1.63* 1.61*  CALCIUM 10.1 10.7* 10.2 10.0 9.8 9.8  MG 2.4 2.3 2.4  --  2.3  --   PHOS 4.6 4.4 5.1*  --  5.1*  --     ABG: No results for input(s): PHART, PCO2ART, PO2ART, HCO3, O2SAT in the last 168 hours.  Liver Function Tests: Recent Labs  Lab 06/18/21 0313 06/19/21 0440 06/20/21 0343 06/23/21 0530  ALBUMIN 2.9* 2.9* 2.7* 2.9*   No results for input(s): LIPASE, AMYLASE in the last 168 hours. No results for input(s): AMMONIA in the last 168 hours.  CBC: Recent Labs  Lab 06/18/21 0313 06/19/21 0440  06/20/21 0343 06/23/21 0530  WBC 8.3 8.3 8.7 11.0*  HGB 9.4* 9.2* 8.5* 9.3*  HCT 31.0* 29.5* 27.6* 30.0*  MCV 94.2 93.9 93.9 92.0  PLT 189 162 120* 112*    Cardiac Enzymes: No results for input(s): CKTOTAL, CKMB, CKMBINDEX, TROPONINI in the last 168 hours.  BNP (last 3 results) No results for input(s): BNP in the last 8760 hours.  ProBNP (last 3 results) No results for input(s): PROBNP in the last 8760 hours.  Radiological Exams: No results found.  Assessment/Plan Active Problems:   Acute on chronic respiratory failure with hypoxia (HCC)   Severe sepsis with septic shock (HCC)   Tracheostomy status (HCC)   Chronic heart failure with preserved ejection fraction (HCC)   AF (paroxysmal atrial fibrillation) (HCC)   Acute renal failure due to tubular necrosis (HCC)   Acute on chronic respiratory failure with hypoxia plan is to continue with the weaning protocol will go on T collar as tolerated Severe sepsis with shock resolved hemodynamics are stable Tracheostomy remains in place at this time Chronic heart failure preserved ejection fraction we will continue with supportive care Atrial fibrillation rate is controlled Acute renal failure following labs   I have personally seen and evaluated the patient, evaluated laboratory and imaging results, formulated the assessment and plan and placed orders. The Patient requires high complexity decision making with multiple systems involvement.  Rounds were done with the Respiratory Therapy Director and Staff therapists and discussed  with nursing staff also.  [name], MD [facility] Pulmonary Critical Care Medicine Sleep Medicine

## 2021-06-25 DIAGNOSIS — I480 Paroxysmal atrial fibrillation: Secondary | ICD-10-CM | POA: Diagnosis not present

## 2021-06-25 DIAGNOSIS — J9621 Acute and chronic respiratory failure with hypoxia: Secondary | ICD-10-CM | POA: Diagnosis not present

## 2021-06-25 DIAGNOSIS — I5032 Chronic diastolic (congestive) heart failure: Secondary | ICD-10-CM | POA: Diagnosis not present

## 2021-06-25 DIAGNOSIS — N170 Acute kidney failure with tubular necrosis: Secondary | ICD-10-CM | POA: Diagnosis not present

## 2021-06-25 LAB — BLOOD GAS, ARTERIAL
Acid-base deficit: 1.3 mmol/L (ref 0.0–2.0)
Bicarbonate: 22.2 mmol/L (ref 20.0–28.0)
FIO2: 28
O2 Saturation: 98.9 %
Patient temperature: 37
pCO2 arterial: 32.5 mmHg (ref 32.0–48.0)
pH, Arterial: 7.449 (ref 7.350–7.450)
pO2, Arterial: 115 mmHg — ABNORMAL HIGH (ref 83.0–108.0)

## 2021-06-25 NOTE — Progress Notes (Signed)
Pulmonary [place]   PULMONARY CRITICAL CARE SERVICE  PROGRESS NOTE     Tony Young  [id number]:[id number]  DOB: May 01, 1956   DOA: 06/10/2021  Referring Physician: Satira Sark, MD  HPI: Tony Young is a 66 y.o. male being followed for ventilator/airway/oxygen weaning Acute on Chronic Respiratory Failure.  Patient is on T collar has been on 40% FiO2 goal is for 24 hours  Medications: Reviewed on Rounds  Physical Exam:  Vitals: Temperature is 97.3 pulse 106 respiratory 24 blood pressure is 123/63 saturations 100%  Ventilator Settings on T collar FiO2 is 40%  General: Comfortable at this time Neck: supple Cardiovascular: no malignant arrhythmias Respiratory: No rhonchi very coarse breath sounds Skin: no rash seen on limited exam Musculoskeletal: No gross abnormality Psychiatric:unable to assess Neurologic:no involuntary movements         Lab Data:   Basic Metabolic Panel: Recent Labs  Lab 06/19/21 0440 06/20/21 0343 06/21/21 0400 06/23/21 0530 06/24/21 0416  NA 149* 145 145 144 141  K 4.0 4.0 4.3 3.6 4.6  CL 113* 113* 110 111 108  CO2 24 24 22 23  20*  GLUCOSE 164* 155* 99 58* 174*  BUN 115* 107* 93* 70* 77*  CREATININE 1.85* 1.80* 1.74* 1.63* 1.61*  CALCIUM 10.7* 10.2 10.0 9.8 9.8  MG 2.3 2.4  --  2.3  --   PHOS 4.4 5.1*  --  5.1*  --     ABG: No results for input(s): PHART, PCO2ART, PO2ART, HCO3, O2SAT in the last 168 hours.  Liver Function Tests: Recent Labs  Lab 06/19/21 0440 06/20/21 0343 06/23/21 0530  ALBUMIN 2.9* 2.7* 2.9*   No results for input(s): LIPASE, AMYLASE in the last 168 hours. No results for input(s): AMMONIA in the last 168 hours.  CBC: Recent Labs  Lab 06/19/21 0440 06/20/21 0343 06/23/21 0530  WBC 8.3 8.7 11.0*  HGB 9.2* 8.5* 9.3*  HCT 29.5* 27.6* 30.0*  MCV 93.9 93.9 92.0  PLT 162 120* 112*    Cardiac Enzymes: No results for input(s): CKTOTAL, CKMB,  CKMBINDEX, TROPONINI in the last 168 hours.  BNP (last 3 results) No results for input(s): BNP in the last 8760 hours.  ProBNP (last 3 results) No results for input(s): PROBNP in the last 8760 hours.  Radiological Exams: No results found.  Assessment/Plan Active Problems:   Acute on chronic respiratory failure with hypoxia (HCC)   Severe sepsis with septic shock (HCC)   Tracheostomy status (HCC)   Chronic heart failure with preserved ejection fraction (HCC)   AF (paroxysmal atrial fibrillation) (HCC)   Acute renal failure due to tubular necrosis (HCC)   Acute on chronic respiratory failure with hypoxia the goal is for 24-hour wean on the T-piece Severe sepsis treated resolved hemodynamics are stable Chronic heart failure preserved ejection fraction we will continue with supportive care Atrial fibrillation rate is controlled at this time Acute renal failure tubular necrosis supportive care monitoring patient's lab work   I have personally seen and evaluated the patient, evaluated laboratory and imaging results, formulated the assessment and plan and placed orders. The Patient requires high complexity decision making with multiple systems involvement.  Rounds were done with the Respiratory Therapy Director and Staff therapists and discussed with nursing staff also.  [name], MD [facility] Pulmonary Critical Care Medicine Sleep Medicine

## 2021-06-26 DIAGNOSIS — N170 Acute kidney failure with tubular necrosis: Secondary | ICD-10-CM | POA: Diagnosis not present

## 2021-06-26 DIAGNOSIS — J9621 Acute and chronic respiratory failure with hypoxia: Secondary | ICD-10-CM | POA: Diagnosis not present

## 2021-06-26 DIAGNOSIS — I5032 Chronic diastolic (congestive) heart failure: Secondary | ICD-10-CM | POA: Diagnosis not present

## 2021-06-26 DIAGNOSIS — I480 Paroxysmal atrial fibrillation: Secondary | ICD-10-CM | POA: Diagnosis not present

## 2021-06-26 LAB — RENAL FUNCTION PANEL
Albumin: 2.8 g/dL — ABNORMAL LOW (ref 3.5–5.0)
Anion gap: 9 (ref 5–15)
BUN: 72 mg/dL — ABNORMAL HIGH (ref 8–23)
CO2: 23 mmol/L (ref 22–32)
Calcium: 9.7 mg/dL (ref 8.9–10.3)
Chloride: 113 mmol/L — ABNORMAL HIGH (ref 98–111)
Creatinine, Ser: 1.46 mg/dL — ABNORMAL HIGH (ref 0.61–1.24)
GFR, Estimated: 53 mL/min — ABNORMAL LOW (ref >60)
Glucose, Bld: 139 mg/dL — ABNORMAL HIGH (ref 70–99)
Phosphorus: 4.5 mg/dL (ref 2.5–4.6)
Potassium: 3.7 mmol/L (ref 3.5–5.1)
Sodium: 145 mmol/L (ref 135–145)

## 2021-06-26 LAB — CBC
HCT: 29.6 % — ABNORMAL LOW (ref 39.0–52.0)
Hemoglobin: 9.4 g/dL — ABNORMAL LOW (ref 13.0–17.0)
MCH: 28.8 pg (ref 26.0–34.0)
MCHC: 31.8 g/dL (ref 30.0–36.0)
MCV: 90.8 fL (ref 80.0–100.0)
Platelets: 127 10*3/uL — ABNORMAL LOW (ref 150–400)
RBC: 3.26 MIL/uL — ABNORMAL LOW (ref 4.22–5.81)
RDW: 14.7 % (ref 11.5–15.5)
WBC: 10.2 10*3/uL (ref 4.0–10.5)
nRBC: 0 % (ref 0.0–0.2)

## 2021-06-26 LAB — MAGNESIUM: Magnesium: 2.1 mg/dL (ref 1.7–2.4)

## 2021-06-26 NOTE — Progress Notes (Signed)
Pulmonary [place]   PULMONARY CRITICAL CARE SERVICE  PROGRESS NOTE     Juanluis Guastella  NOI:[id number]  DOB: [birth year]   DOA: 06/10/2021  Referring Physician: Satira Sark, MD  HPI: Tony Young is a 66 y.o. male being followed for ventilator/airway/oxygen weaning Acute on Chronic Respiratory Failure.  Patient is comfortable without distress has been on T-piece using the PMV  Medications: Reviewed on Rounds  Physical Exam:  Vitals: Temperature is 97.1 pulse 107 respiratory rate is 25 blood pressure is 127/65 saturations 99%  Ventilator Settings on T collar with PMV  General: Comfortable at this time Neck: supple Cardiovascular: no malignant arrhythmias Respiratory: No rhonchi very coarse breath sounds Skin: no rash seen on limited exam Musculoskeletal: No gross abnormality Psychiatric:unable to assess Neurologic:no involuntary movements         Lab Data:   Basic Metabolic Panel: Recent Labs  Lab 06/20/21 0343 06/21/21 0400 06/23/21 0530 06/24/21 0416 06/26/21 0349  NA 145 145 144 141 145  K 4.0 4.3 3.6 4.6 3.7  CL 113* 110 111 108 113*  CO2 24 22 23  20* 23  GLUCOSE 155* 99 58* 174* 139*  BUN 107* 93* 70* 77* 72*  CREATININE 1.80* 1.74* 1.63* 1.61* 1.46*  CALCIUM 10.2 10.0 9.8 9.8 9.7  MG 2.4  --  2.3  --  2.1  PHOS 5.1*  --  5.1*  --  4.5    ABG: Recent Labs  Lab 06/25/21 1252  PHART 7.449  PCO2ART 32.5  PO2ART 115*  HCO3 22.2  O2SAT 98.9    Liver Function Tests: Recent Labs  Lab 06/20/21 0343 06/23/21 0530 06/26/21 0349  ALBUMIN 2.7* 2.9* 2.8*   No results for input(s): LIPASE, AMYLASE in the last 168 hours. No results for input(s): AMMONIA in the last 168 hours.  CBC: Recent Labs  Lab 06/20/21 0343 06/23/21 0530 06/26/21 0526  WBC 8.7 11.0* 10.2  HGB 8.5* 9.3* 9.4*  HCT 27.6* 30.0* 29.6*  MCV 93.9 92.0 90.8  PLT 120* 112* 127*    Cardiac Enzymes: No results  for input(s): CKTOTAL, CKMB, CKMBINDEX, TROPONINI in the last 168 hours.  BNP (last 3 results) No results for input(s): BNP in the last 8760 hours.  ProBNP (last 3 results) No results for input(s): PROBNP in the last 8760 hours.  Radiological Exams: No results found.  Assessment/Plan Active Problems:   Acute on chronic respiratory failure with hypoxia (HCC)   Severe sepsis with septic shock (HCC)   Tracheostomy status (HCC)   Chronic heart failure with preserved ejection fraction (HCC)   AF (paroxysmal atrial fibrillation) (HCC)   Acute renal failure due to tubular necrosis (HCC)   Acute on chronic respiratory failure with hypoxia plan continue with weaning we will go ahead and change trach to a cuffless type trach Severe sepsis treated resolved Tracheostomy will be changed to a cuffless #6 Chronic heart failure preserved ejection fraction we will continue to follow along closely Atrial fibrillation rate is controlled at this time Acute renal failure following maintaining labs patient is creatinine is 1.46   I have personally seen and evaluated the patient, evaluated laboratory and imaging results, formulated the assessment and plan and placed orders. The Patient requires high complexity decision making with multiple systems involvement.  Rounds were done with the Respiratory Therapy Director and Staff therapists and discussed with nursing staff also.  [name], MD [facility] Pulmonary Critical Care Medicine Sleep Medicine

## 2021-06-27 DIAGNOSIS — I480 Paroxysmal atrial fibrillation: Secondary | ICD-10-CM | POA: Diagnosis not present

## 2021-06-27 DIAGNOSIS — I5032 Chronic diastolic (congestive) heart failure: Secondary | ICD-10-CM | POA: Diagnosis not present

## 2021-06-27 DIAGNOSIS — N170 Acute kidney failure with tubular necrosis: Secondary | ICD-10-CM | POA: Diagnosis not present

## 2021-06-27 DIAGNOSIS — J9621 Acute and chronic respiratory failure with hypoxia: Secondary | ICD-10-CM | POA: Diagnosis not present

## 2021-06-27 LAB — BASIC METABOLIC PANEL
Anion gap: 12 (ref 5–15)
BUN: 62 mg/dL — ABNORMAL HIGH (ref 8–23)
CO2: 21 mmol/L — ABNORMAL LOW (ref 22–32)
Calcium: 9.8 mg/dL (ref 8.9–10.3)
Chloride: 112 mmol/L — ABNORMAL HIGH (ref 98–111)
Creatinine, Ser: 1.44 mg/dL — ABNORMAL HIGH (ref 0.61–1.24)
GFR, Estimated: 54 mL/min — ABNORMAL LOW (ref >60)
Glucose, Bld: 228 mg/dL — ABNORMAL HIGH (ref 70–99)
Potassium: 4.4 mmol/L (ref 3.5–5.1)
Sodium: 145 mmol/L (ref 135–145)

## 2021-06-27 LAB — CBC WITH DIFFERENTIAL/PLATELET
Abs Immature Granulocytes: 0.08 10*3/uL — ABNORMAL HIGH (ref 0.00–0.07)
Basophils Absolute: 0 10*3/uL (ref 0.0–0.1)
Basophils Relative: 0 %
Eosinophils Absolute: 0.1 10*3/uL (ref 0.0–0.5)
Eosinophils Relative: 1 %
HCT: 30.6 % — ABNORMAL LOW (ref 39.0–52.0)
Hemoglobin: 9.6 g/dL — ABNORMAL LOW (ref 13.0–17.0)
Immature Granulocytes: 1 %
Lymphocytes Relative: 7 %
Lymphs Abs: 0.6 10*3/uL — ABNORMAL LOW (ref 0.7–4.0)
MCH: 28.7 pg (ref 26.0–34.0)
MCHC: 31.4 g/dL (ref 30.0–36.0)
MCV: 91.3 fL (ref 80.0–100.0)
Monocytes Absolute: 0.6 10*3/uL (ref 0.1–1.0)
Monocytes Relative: 7 %
Neutro Abs: 7.4 10*3/uL (ref 1.7–7.7)
Neutrophils Relative %: 84 %
Platelets: 128 10*3/uL — ABNORMAL LOW (ref 150–400)
RBC: 3.35 MIL/uL — ABNORMAL LOW (ref 4.22–5.81)
RDW: 14.9 % (ref 11.5–15.5)
WBC: 8.7 10*3/uL (ref 4.0–10.5)
nRBC: 0 % (ref 0.0–0.2)

## 2021-06-27 LAB — COMPREHENSIVE METABOLIC PANEL
ALT: 27 U/L (ref 0–44)
AST: 21 U/L (ref 15–41)
Albumin: 2.9 g/dL — ABNORMAL LOW (ref 3.5–5.0)
Alkaline Phosphatase: 137 U/L — ABNORMAL HIGH (ref 38–126)
Anion gap: 10 (ref 5–15)
BUN: 59 mg/dL — ABNORMAL HIGH (ref 8–23)
CO2: 23 mmol/L (ref 22–32)
Calcium: 10 mg/dL (ref 8.9–10.3)
Chloride: 113 mmol/L — ABNORMAL HIGH (ref 98–111)
Creatinine, Ser: 1.47 mg/dL — ABNORMAL HIGH (ref 0.61–1.24)
GFR, Estimated: 53 mL/min — ABNORMAL LOW (ref >60)
Glucose, Bld: 260 mg/dL — ABNORMAL HIGH (ref 70–99)
Potassium: 3.9 mmol/L (ref 3.5–5.1)
Sodium: 146 mmol/L — ABNORMAL HIGH (ref 135–145)
Total Bilirubin: 0.6 mg/dL (ref 0.3–1.2)
Total Protein: 6.2 g/dL — ABNORMAL LOW (ref 6.5–8.1)

## 2021-06-27 LAB — MAGNESIUM: Magnesium: 2.2 mg/dL (ref 1.7–2.4)

## 2021-06-27 LAB — TRIGLYCERIDES: Triglycerides: 109 mg/dL (ref <150)

## 2021-06-27 NOTE — Progress Notes (Signed)
Pulmonary [place]   PULMONARY CRITICAL CARE SERVICE  PROGRESS NOTE     Tony Young  [id number]:[id number]  DOB: 10-18-1955   DOA: 06/10/2021  Referring Physician: Satira Sark, MD  HPI: Tony Young is a 66 y.o. male being followed for ventilator/airway/oxygen weaning Acute on Chronic Respiratory Failure.  Patient is on T-piece has a #6 trach should be ready for capping  Medications: Reviewed on Rounds  Physical Exam:  Vitals: Temperature is 97.6 pulse 90 respiratory 21 blood pressure is 151/63 saturations 99%  Ventilator Settings and T-piece ready for capping  General: Comfortable at this time Neck: supple Cardiovascular: no malignant arrhythmias Respiratory: No rhonchi no rales are noted at this time Skin: no rash seen on limited exam Musculoskeletal: No gross abnormality Psychiatric:unable to assess Neurologic:no involuntary movements         Lab Data:   Basic Metabolic Panel: Recent Labs  Lab 06/21/21 0400 06/23/21 0530 06/24/21 0416 06/26/21 0349 06/27/21 0341  NA 145 144 141 145 145  K 4.3 3.6 4.6 3.7 4.4  CL 110 111 108 113* 112*  CO2 22 23 20* 23 21*  GLUCOSE 99 58* 174* 139* 228*  BUN 93* 70* 77* 72* 62*  CREATININE 1.74* 1.63* 1.61* 1.46* 1.44*  CALCIUM 10.0 9.8 9.8 9.7 9.8  MG  --  2.3  --  2.1  --   PHOS  --  5.1*  --  4.5  --     ABG: Recent Labs  Lab 06/25/21 1252  PHART 7.449  PCO2ART 32.5  PO2ART 115*  HCO3 22.2  O2SAT 98.9    Liver Function Tests: Recent Labs  Lab 06/23/21 0530 06/26/21 0349  ALBUMIN 2.9* 2.8*   No results for input(s): LIPASE, AMYLASE in the last 168 hours. No results for input(s): AMMONIA in the last 168 hours.  CBC: Recent Labs  Lab 06/23/21 0530 06/26/21 0526  WBC 11.0* 10.2  HGB 9.3* 9.4*  HCT 30.0* 29.6*  MCV 92.0 90.8  PLT 112* 127*    Cardiac Enzymes: No results for input(s): CKTOTAL, CKMB, CKMBINDEX, TROPONINI in the last  168 hours.  BNP (last 3 results) No results for input(s): BNP in the last 8760 hours.  ProBNP (last 3 results) No results for input(s): PROBNP in the last 8760 hours.  Radiological Exams: No results found.  Assessment/Plan Active Problems:   Acute on chronic respiratory failure with hypoxia (HCC)   Severe sepsis with septic shock (HCC)   Tracheostomy status (HCC)   Chronic heart failure with preserved ejection fraction (HCC)   AF (paroxysmal atrial fibrillation) (HCC)   Acute renal failure due to tubular necrosis (HCC)   Acute on chronic respiratory failure with hypoxia plan is to proceed to capping trials Severe sepsis with shock resolved hemodynamics are stable Tracheostomy remains in place Chronic heart failure supportive care monitor fluid status Atrial fibrillation rate is controlled   I have personally seen and evaluated the patient, evaluated laboratory and imaging results, formulated the assessment and plan and placed orders. The Patient requires high complexity decision making with multiple systems involvement.  Rounds were done with the Respiratory Therapy Director and Staff therapists and discussed with nursing staff also.  [name], MD [facility] Pulmonary Critical Care Medicine Sleep Medicine

## 2021-06-28 ENCOUNTER — Other Ambulatory Visit (HOSPITAL_COMMUNITY): Payer: Medicare Other

## 2021-06-29 DIAGNOSIS — J9621 Acute and chronic respiratory failure with hypoxia: Secondary | ICD-10-CM | POA: Diagnosis not present

## 2021-06-29 DIAGNOSIS — I5032 Chronic diastolic (congestive) heart failure: Secondary | ICD-10-CM | POA: Diagnosis not present

## 2021-06-29 DIAGNOSIS — N170 Acute kidney failure with tubular necrosis: Secondary | ICD-10-CM | POA: Diagnosis not present

## 2021-06-29 DIAGNOSIS — I480 Paroxysmal atrial fibrillation: Secondary | ICD-10-CM | POA: Diagnosis not present

## 2021-06-29 LAB — CBC
HCT: 29.6 % — ABNORMAL LOW (ref 39.0–52.0)
Hemoglobin: 9.2 g/dL — ABNORMAL LOW (ref 13.0–17.0)
MCH: 28.8 pg (ref 26.0–34.0)
MCHC: 31.1 g/dL (ref 30.0–36.0)
MCV: 92.8 fL (ref 80.0–100.0)
Platelets: 128 10*3/uL — ABNORMAL LOW (ref 150–400)
RBC: 3.19 MIL/uL — ABNORMAL LOW (ref 4.22–5.81)
RDW: 14.9 % (ref 11.5–15.5)
WBC: 7.6 10*3/uL (ref 4.0–10.5)
nRBC: 0 % (ref 0.0–0.2)

## 2021-06-29 LAB — URINALYSIS, ROUTINE W REFLEX MICROSCOPIC
Bilirubin Urine: NEGATIVE
Glucose, UA: >=500 mg/dL — AB
Ketones, ur: NEGATIVE mg/dL
Leukocytes,Ua: NEGATIVE
Nitrite: NEGATIVE
Protein, ur: NEGATIVE mg/dL
Specific Gravity, Urine: 1.01 (ref 1.005–1.030)
pH: 5.5 (ref 5.0–8.0)

## 2021-06-29 LAB — BASIC METABOLIC PANEL
Anion gap: 10 (ref 5–15)
BUN: 67 mg/dL — ABNORMAL HIGH (ref 8–23)
CO2: 25 mmol/L (ref 22–32)
Calcium: 10.2 mg/dL (ref 8.9–10.3)
Chloride: 117 mmol/L — ABNORMAL HIGH (ref 98–111)
Creatinine, Ser: 1.54 mg/dL — ABNORMAL HIGH (ref 0.61–1.24)
GFR, Estimated: 50 mL/min — ABNORMAL LOW (ref >60)
Glucose, Bld: 192 mg/dL — ABNORMAL HIGH (ref 70–99)
Potassium: 3.3 mmol/L — ABNORMAL LOW (ref 3.5–5.1)
Sodium: 152 mmol/L — ABNORMAL HIGH (ref 135–145)

## 2021-06-29 LAB — URINALYSIS, MICROSCOPIC (REFLEX)

## 2021-06-29 NOTE — Progress Notes (Signed)
Pulmonary [place]   PULMONARY CRITICAL CARE SERVICE  PROGRESS NOTE     Tony Young  BZJ:[id number]  DOB: 1956-04-30   DOA: 06/10/2021  Referring Physician: Satira Sark, MD  HPI: Tony Young is a 66 y.o. male being followed for ventilator/airway/oxygen weaning Acute on Chronic Respiratory Failure.  Patient is on T-piece has been on 28% FiO2 increased heart rate was noted so may need to have cardio see the patient  Medications: Reviewed on Rounds  Physical Exam:  Vitals: Temperature 99.4 pulse 109 respiratory rate was 28 blood pressure is 132/64 saturations 98%  Ventilator Settings off vent T-piece 28% FiO2  General: Comfortable at this time Neck: supple Cardiovascular: no malignant arrhythmias Respiratory: Scattered rhonchi very coarse breath sounds Skin: no rash seen on limited exam Musculoskeletal: No gross abnormality Psychiatric:unable to assess Neurologic:no involuntary movements         Lab Data:   Basic Metabolic Panel: Recent Labs  Lab 06/23/21 0530 06/24/21 0416 06/26/21 0349 06/27/21 0341 06/27/21 1205 06/29/21 0652  NA 144 141 145 145 146* 152*  K 3.6 4.6 3.7 4.4 3.9 3.3*  CL 111 108 113* 112* 113* 117*  CO2 23 20* 23 21* 23 25  GLUCOSE 58* 174* 139* 228* 260* 192*  BUN 70* 77* 72* 62* 59* 67*  CREATININE 1.63* 1.61* 1.46* 1.44* 1.47* 1.54*  CALCIUM 9.8 9.8 9.7 9.8 10.0 10.2  MG 2.3  --  2.1  --  2.2  --   PHOS 5.1*  --  4.5  --   --   --     ABG: Recent Labs  Lab 06/25/21 1252  PHART 7.449  PCO2ART 32.5  PO2ART 115*  HCO3 22.2  O2SAT 98.9    Liver Function Tests: Recent Labs  Lab 06/23/21 0530 06/26/21 0349 06/27/21 1205  AST  --   --  21  ALT  --   --  27  ALKPHOS  --   --  137*  BILITOT  --   --  0.6  PROT  --   --  6.2*  ALBUMIN 2.9* 2.8* 2.9*   No results for input(s): LIPASE, AMYLASE in the last 168 hours. No results for input(s): AMMONIA in the  last 168 hours.  CBC: Recent Labs  Lab 06/23/21 0530 06/26/21 0526 06/27/21 1205 06/29/21 0652  WBC 11.0* 10.2 8.7 7.6  NEUTROABS  --   --  7.4  --   HGB 9.3* 9.4* 9.6* 9.2*  HCT 30.0* 29.6* 30.6* 29.6*  MCV 92.0 90.8 91.3 92.8  PLT 112* 127* 128* 128*    Cardiac Enzymes: No results for input(s): CKTOTAL, CKMB, CKMBINDEX, TROPONINI in the last 168 hours.  BNP (last 3 results) No results for input(s): BNP in the last 8760 hours.  ProBNP (last 3 results) No results for input(s): PROBNP in the last 8760 hours.  Radiological Exams: DG Chest Port 1 View  Result Date: 06/28/2021 CLINICAL DATA:  66 year old male with history of renal failure. EXAM: PORTABLE CHEST 1 VIEW COMPARISON:  Chest x-ray 06/18/2021. FINDINGS: A tracheostomy tube is in place with tip 6.0 cm above the carina. Right internal jugular PermCath with tip terminating in the right atrium. Lung volumes are low. No consolidative airspace disease. No pleural effusions. Cephalization of the pulmonary vasculature with mild indistinctness of the interstitial markings. No pneumothorax. Heart size is mildly enlarged. Upper mediastinal contours are within normal limits. IMPRESSION: 1. Support apparatus, as above. 2. The appearance the chest  suggests mild congestive heart failure, as above. Electronically Signed   By: Vinnie Langton M.D.   On: 06/28/2021 11:17    Assessment/Plan Active Problems:   Acute on chronic respiratory failure with hypoxia (HCC)   Severe sepsis with septic shock (HCC)   Tracheostomy status (HCC)   Chronic heart failure with preserved ejection fraction (HCC)   AF (paroxysmal atrial fibrillation) (HCC)   Acute renal failure due to tubular necrosis (HCC)   Acute on chronic respiratory failure hypoxia plan is to continue with the weaning process as tolerated Severe sepsis with shock hemodynamics are stable resolved Tracheostomy remains in place Chronic heart failure preserved ejection fraction no change  we will continue to follow along closely Atrial fibrillation rate was slightly elevated today we will consider cardiac eval   I have personally seen and evaluated the patient, evaluated laboratory and imaging results, formulated the assessment and plan and placed orders. The Patient requires high complexity decision making with multiple systems involvement.  Rounds were done with the Respiratory Therapy Director and Staff therapists and discussed with nursing staff also.  [name], MD [facility] Pulmonary Critical Care Medicine Sleep Medicine

## 2021-06-30 DIAGNOSIS — J9621 Acute and chronic respiratory failure with hypoxia: Secondary | ICD-10-CM | POA: Diagnosis not present

## 2021-06-30 DIAGNOSIS — N170 Acute kidney failure with tubular necrosis: Secondary | ICD-10-CM | POA: Diagnosis not present

## 2021-06-30 DIAGNOSIS — I5032 Chronic diastolic (congestive) heart failure: Secondary | ICD-10-CM | POA: Diagnosis not present

## 2021-06-30 DIAGNOSIS — I480 Paroxysmal atrial fibrillation: Secondary | ICD-10-CM | POA: Diagnosis not present

## 2021-06-30 LAB — RENAL FUNCTION PANEL
Albumin: 2.6 g/dL — ABNORMAL LOW (ref 3.5–5.0)
Anion gap: 11 (ref 5–15)
BUN: 74 mg/dL — ABNORMAL HIGH (ref 8–23)
CO2: 24 mmol/L (ref 22–32)
Calcium: 10.4 mg/dL — ABNORMAL HIGH (ref 8.9–10.3)
Chloride: 114 mmol/L — ABNORMAL HIGH (ref 98–111)
Creatinine, Ser: 1.49 mg/dL — ABNORMAL HIGH (ref 0.61–1.24)
GFR, Estimated: 52 mL/min — ABNORMAL LOW (ref >60)
Glucose, Bld: 147 mg/dL — ABNORMAL HIGH (ref 70–99)
Phosphorus: 4.2 mg/dL (ref 2.5–4.6)
Potassium: 3.4 mmol/L — ABNORMAL LOW (ref 3.5–5.1)
Sodium: 149 mmol/L — ABNORMAL HIGH (ref 135–145)

## 2021-06-30 LAB — MAGNESIUM
Magnesium: 2.2 mg/dL (ref 1.7–2.4)
Magnesium: 2.1 mg/dL (ref 1.7–2.4)

## 2021-06-30 LAB — TRIGLYCERIDES: Triglycerides: 113 mg/dL (ref <150)

## 2021-06-30 LAB — CBC
HCT: 28.8 % — ABNORMAL LOW (ref 39.0–52.0)
Hemoglobin: 8.7 g/dL — ABNORMAL LOW (ref 13.0–17.0)
MCH: 28 pg (ref 26.0–34.0)
MCHC: 30.2 g/dL (ref 30.0–36.0)
MCV: 92.6 fL (ref 80.0–100.0)
Platelets: 129 10*3/uL — ABNORMAL LOW (ref 150–400)
RBC: 3.11 MIL/uL — ABNORMAL LOW (ref 4.22–5.81)
RDW: 15 % (ref 11.5–15.5)
WBC: 8.3 10*3/uL (ref 4.0–10.5)
nRBC: 0 % (ref 0.0–0.2)

## 2021-06-30 LAB — LACTIC ACID, PLASMA: Lactic Acid, Venous: 2 mmol/L (ref 0.5–1.9)

## 2021-06-30 LAB — POTASSIUM: Potassium: 3.4 mmol/L — ABNORMAL LOW (ref 3.5–5.1)

## 2021-06-30 NOTE — Progress Notes (Signed)
Pulmonary [place]   PULMONARY CRITICAL CARE SERVICE  PROGRESS NOTE     Ajdin Macke  FTD:[id number]  DOB: 1956/03/31   DOA: 06/10/2021  Referring Physician: Satira Sark, MD  HPI: Gregori Abril is a 66 y.o. male being followed for ventilator/airway/oxygen weaning Acute on Chronic Respiratory Failure.  Low-grade fevers noted otherwise resting comfortably without distress at this time.  Medications: Reviewed on Rounds  Physical Exam:  Vitals: Temperature 99.1 pulse 106 respiratory 22 blood pressure is 114/65 saturations 100%  Ventilator Settings on T collar on 28% FiO2  General: Comfortable at this time Neck: supple Cardiovascular: no malignant arrhythmias Respiratory: Scattered rhonchi expansion is equal at this time Skin: no rash seen on limited exam Musculoskeletal: No gross abnormality Psychiatric:unable to assess Neurologic:no involuntary movements         Lab Data:   Basic Metabolic Panel: Recent Labs  Lab 06/26/21 0349 06/27/21 0341 06/27/21 1205 06/29/21 0652 06/30/21 0403  NA 145 145 146* 152* 149*  K 3.7 4.4 3.9 3.3* 3.4*  CL 113* 112* 113* 117* 114*  CO2 23 21* 23 25 24   GLUCOSE 139* 228* 260* 192* 147*  BUN 72* 62* 59* 67* 74*  CREATININE 1.46* 1.44* 1.47* 1.54* 1.49*  CALCIUM 9.7 9.8 10.0 10.2 10.4*  MG 2.1  --  2.2  --  2.2  PHOS 4.5  --   --   --  4.2    ABG: Recent Labs  Lab 06/25/21 1252  PHART 7.449  PCO2ART 32.5  PO2ART 115*  HCO3 22.2  O2SAT 98.9    Liver Function Tests: Recent Labs  Lab 06/26/21 0349 06/27/21 1205 06/30/21 0403  AST  --  21  --   ALT  --  27  --   ALKPHOS  --  137*  --   BILITOT  --  0.6  --   PROT  --  6.2*  --   ALBUMIN 2.8* 2.9* 2.6*   No results for input(s): LIPASE, AMYLASE in the last 168 hours. No results for input(s): AMMONIA in the last 168 hours.  CBC: Recent Labs  Lab 06/26/21 0526 06/27/21 1205 06/29/21 0652  06/30/21 0403  WBC 10.2 8.7 7.6 8.3  NEUTROABS  --  7.4  --   --   HGB 9.4* 9.6* 9.2* 8.7*  HCT 29.6* 30.6* 29.6* 28.8*  MCV 90.8 91.3 92.8 92.6  PLT 127* 128* 128* 129*    Cardiac Enzymes: No results for input(s): CKTOTAL, CKMB, CKMBINDEX, TROPONINI in the last 168 hours.  BNP (last 3 results) No results for input(s): BNP in the last 8760 hours.  ProBNP (last 3 results) No results for input(s): PROBNP in the last 8760 hours.  Radiological Exams: DG Chest Port 1 View  Result Date: 06/28/2021 CLINICAL DATA:  66 year old male with history of renal failure. EXAM: PORTABLE CHEST 1 VIEW COMPARISON:  Chest x-ray 06/18/2021. FINDINGS: A tracheostomy tube is in place with tip 6.0 cm above the carina. Right internal jugular PermCath with tip terminating in the right atrium. Lung volumes are low. No consolidative airspace disease. No pleural effusions. Cephalization of the pulmonary vasculature with mild indistinctness of the interstitial markings. No pneumothorax. Heart size is mildly enlarged. Upper mediastinal contours are within normal limits. IMPRESSION: 1. Support apparatus, as above. 2. The appearance the chest suggests mild congestive heart failure, as above. Electronically Signed   By: Vinnie Langton M.D.   On: 06/28/2021 11:17    Assessment/Plan Active Problems:  Acute on chronic respiratory failure with hypoxia (HCC)   Severe sepsis with septic shock (HCC)   Tracheostomy status (HCC)   Chronic heart failure with preserved ejection fraction (HCC)   AF (paroxysmal atrial fibrillation) (HCC)   Acute renal failure due to tubular necrosis ([place])   Acute on chronic respiratory failure with hypoxia patient is going to continue with attempts at Weaning.  We will try to advance with capping trials. Severe sepsis resolved hemodynamics are stable Tracheostomy remains in place Chronic heart failure preserved ejection fraction we will continue with supportive care Atrial fibrillation rate  is controlled at this time   I have personally seen and evaluated the patient, evaluated laboratory and imaging results, formulated the assessment and plan and placed orders. The Patient requires high complexity decision making with multiple systems involvement.  Rounds were done with the Respiratory Therapy Director and Staff therapists and discussed with nursing staff also.  [name], MD [facility] Pulmonary Critical Care Medicine Sleep Medicine

## 2021-07-01 ENCOUNTER — Other Ambulatory Visit (HOSPITAL_COMMUNITY): Payer: Medicare Other

## 2021-07-01 DIAGNOSIS — J9621 Acute and chronic respiratory failure with hypoxia: Secondary | ICD-10-CM | POA: Diagnosis not present

## 2021-07-01 DIAGNOSIS — I480 Paroxysmal atrial fibrillation: Secondary | ICD-10-CM | POA: Diagnosis not present

## 2021-07-01 DIAGNOSIS — N170 Acute kidney failure with tubular necrosis: Secondary | ICD-10-CM | POA: Diagnosis not present

## 2021-07-01 DIAGNOSIS — I5032 Chronic diastolic (congestive) heart failure: Secondary | ICD-10-CM | POA: Diagnosis not present

## 2021-07-01 HISTORY — PX: IR REMOVAL TUN CV CATH W/O FL: IMG2289

## 2021-07-01 LAB — RENAL FUNCTION PANEL
Albumin: 2.6 g/dL — ABNORMAL LOW (ref 3.5–5.0)
Anion gap: 11 (ref 5–15)
BUN: 76 mg/dL — ABNORMAL HIGH (ref 8–23)
CO2: 23 mmol/L (ref 22–32)
Calcium: 9.8 mg/dL (ref 8.9–10.3)
Chloride: 110 mmol/L (ref 98–111)
Creatinine, Ser: 1.4 mg/dL — ABNORMAL HIGH (ref 0.61–1.24)
GFR, Estimated: 56 mL/min — ABNORMAL LOW (ref >60)
Glucose, Bld: 171 mg/dL — ABNORMAL HIGH (ref 70–99)
Phosphorus: 4 mg/dL (ref 2.5–4.6)
Potassium: 3.8 mmol/L (ref 3.5–5.1)
Sodium: 144 mmol/L (ref 135–145)

## 2021-07-01 LAB — CBC
HCT: 28.5 % — ABNORMAL LOW (ref 39.0–52.0)
Hemoglobin: 8.8 g/dL — ABNORMAL LOW (ref 13.0–17.0)
MCH: 28.4 pg (ref 26.0–34.0)
MCHC: 30.9 g/dL (ref 30.0–36.0)
MCV: 91.9 fL (ref 80.0–100.0)
Platelets: 104 10*3/uL — ABNORMAL LOW (ref 150–400)
RBC: 3.1 MIL/uL — ABNORMAL LOW (ref 4.22–5.81)
RDW: 14.7 % (ref 11.5–15.5)
WBC: 8.8 10*3/uL (ref 4.0–10.5)
nRBC: 0 % (ref 0.0–0.2)

## 2021-07-01 LAB — C DIFFICILE (CDIFF) QUICK SCRN (NO PCR REFLEX)
C Diff antigen: NEGATIVE
C Diff interpretation: NOT DETECTED
C Diff toxin: NEGATIVE

## 2021-07-01 LAB — T4, FREE: Free T4: 0.98 ng/dL (ref 0.61–1.12)

## 2021-07-01 LAB — URINE CULTURE: Culture: NO GROWTH

## 2021-07-01 LAB — LACTIC ACID, PLASMA: Lactic Acid, Venous: 1.9 mmol/L (ref 0.5–1.9)

## 2021-07-01 LAB — TSH: TSH: 3.417 u[IU]/mL (ref 0.350–4.500)

## 2021-07-01 NOTE — Procedures (Signed)
PROCEDURE SUMMARY:  Successful removal of tunneled hemodialysis catheter.  Patient tolerated well.  EBL < 5 mL  See full dictation in Imaging for details.  Masai Kidd S Lauralyn Shadowens PA-C 07/01/2021 3:52 PM

## 2021-07-01 NOTE — Progress Notes (Signed)
Pulmonary [place]   PULMONARY CRITICAL CARE SERVICE  PROGRESS NOTE     Cyris Maalouf  HYW:[id number]  DOB: [birth year]   DOA: 06/10/2021  Referring Physician: Satira Sark, MD  HPI: Crew Goren is a 66 y.o. male being followed for ventilator/airway/oxygen weaning Acute on Chronic Respiratory Failure.  Patient is capping will be completing 24 hours  Medications: Reviewed on Rounds  Physical Exam:  Vitals: Temperature is 97 pulse 109 retake 26 blood pressure is 110/67 saturation is 98%  Ventilator Settings capping with a goal of 24 hours  General: Comfortable at this time Neck: supple Cardiovascular: no malignant arrhythmias Respiratory: No rhonchi or rales are noted at this time. Skin: no rash seen on limited exam Musculoskeletal: No gross abnormality Psychiatric:unable to assess Neurologic:no involuntary movements         Lab Data:   Basic Metabolic Panel: Recent Labs  Lab 06/26/21 0349 06/27/21 0341 06/27/21 1205 06/29/21 0652 06/30/21 0403 06/30/21 1057 07/01/21 0803  NA 145 145 146* 152* 149*  --  144  K 3.7 4.4 3.9 3.3* 3.4* 3.4* 3.8  CL 113* 112* 113* 117* 114*  --  110  CO2 23 21* 23 25 24   --  23  GLUCOSE 139* 228* 260* 192* 147*  --  171*  BUN 72* 62* 59* 67* 74*  --  76*  CREATININE 1.46* 1.44* 1.47* 1.54* 1.49*  --  1.40*  CALCIUM 9.7 9.8 10.0 10.2 10.4*  --  9.8  MG 2.1  --  2.2  --  2.2 2.1  --   PHOS 4.5  --   --   --  4.2  --  4.0    ABG: Recent Labs  Lab 06/25/21 1252  PHART 7.449  PCO2ART 32.5  PO2ART 115*  HCO3 22.2  O2SAT 98.9    Liver Function Tests: Recent Labs  Lab 06/26/21 0349 06/27/21 1205 06/30/21 0403 07/01/21 0803  AST  --  21  --   --   ALT  --  27  --   --   ALKPHOS  --  137*  --   --   BILITOT  --  0.6  --   --   PROT  --  6.2*  --   --   ALBUMIN 2.8* 2.9* 2.6* 2.6*   No results for input(s): LIPASE, AMYLASE in the last 168 hours. No  results for input(s): AMMONIA in the last 168 hours.  CBC: Recent Labs  Lab 06/26/21 0526 06/27/21 1205 06/29/21 0652 06/30/21 0403  WBC 10.2 8.7 7.6 8.3  NEUTROABS  --  7.4  --   --   HGB 9.4* 9.6* 9.2* 8.7*  HCT 29.6* 30.6* 29.6* 28.8*  MCV 90.8 91.3 92.8 92.6  PLT 127* 128* 128* 129*    Cardiac Enzymes: No results for input(s): CKTOTAL, CKMB, CKMBINDEX, TROPONINI in the last 168 hours.  BNP (last 3 results) No results for input(s): BNP in the last 8760 hours.  ProBNP (last 3 results) No results for input(s): PROBNP in the last 8760 hours.  Radiological Exams: DG CHEST PORT 1 VIEW  Result Date: 07/01/2021 CLINICAL DATA:  Pneumonia.  Tracheostomy tube dependent. EXAM: PORTABLE CHEST 1 VIEW COMPARISON:  06/28/2021 FINDINGS: 0534 hours. Tracheostomy tube remains in place. Right IJ central line again noted. Vascular congestion seen previously has decreased in the interval. The cardio pericardial silhouette is enlarged. Telemetry leads overlie the chest. IMPRESSION: Interval improvement in vascular congestion/interstitial edema. Electronically Signed  By: Misty Stanley M.D.   On: 07/01/2021 06:05    Assessment/Plan Active Problems:   Acute on chronic respiratory failure with hypoxia (HCC)   Severe sepsis with septic shock (HCC)   Tracheostomy status (HCC)   Chronic heart failure with preserved ejection fraction (HCC)   AF (paroxysmal atrial fibrillation) (HCC)   Acute renal failure due to tubular necrosis (HCC)   Acute on chronic respiratory failure hypoxia we will continue with capping trials doing well so for Severe sepsis resolved hemodynamics are stable Tracheostomy remains in place Atrial fibrillation rate is controlled Acute renal failure supportive care   I have personally seen and evaluated the patient, evaluated laboratory and imaging results, formulated the assessment and plan and placed orders. The Patient requires high complexity decision making with multiple  systems involvement.  Rounds were done with the Respiratory Therapy Director and Staff therapists and discussed with nursing staff also.  [name], MD [facility] Pulmonary Critical Care Medicine Sleep Medicine

## 2021-07-02 DIAGNOSIS — J9621 Acute and chronic respiratory failure with hypoxia: Secondary | ICD-10-CM | POA: Diagnosis not present

## 2021-07-02 DIAGNOSIS — I480 Paroxysmal atrial fibrillation: Secondary | ICD-10-CM | POA: Diagnosis not present

## 2021-07-02 DIAGNOSIS — I5032 Chronic diastolic (congestive) heart failure: Secondary | ICD-10-CM | POA: Diagnosis not present

## 2021-07-02 DIAGNOSIS — N170 Acute kidney failure with tubular necrosis: Secondary | ICD-10-CM | POA: Diagnosis not present

## 2021-07-02 LAB — RENAL FUNCTION PANEL
Albumin: 2.6 g/dL — ABNORMAL LOW (ref 3.5–5.0)
Anion gap: 9 (ref 5–15)
BUN: 69 mg/dL — ABNORMAL HIGH (ref 8–23)
CO2: 23 mmol/L (ref 22–32)
Calcium: 9.3 mg/dL (ref 8.9–10.3)
Chloride: 107 mmol/L (ref 98–111)
Creatinine, Ser: 1.3 mg/dL — ABNORMAL HIGH (ref 0.61–1.24)
GFR, Estimated: >60 mL/min (ref >60)
Glucose, Bld: 153 mg/dL — ABNORMAL HIGH (ref 70–99)
Phosphorus: 3.8 mg/dL (ref 2.5–4.6)
Potassium: 4 mmol/L (ref 3.5–5.1)
Sodium: 139 mmol/L (ref 135–145)

## 2021-07-02 LAB — CBC
HCT: 28 % — ABNORMAL LOW (ref 39.0–52.0)
Hemoglobin: 8.7 g/dL — ABNORMAL LOW (ref 13.0–17.0)
MCH: 28.1 pg (ref 26.0–34.0)
MCHC: 31.1 g/dL (ref 30.0–36.0)
MCV: 90.3 fL (ref 80.0–100.0)
Platelets: 107 10*3/uL — ABNORMAL LOW (ref 150–400)
RBC: 3.1 MIL/uL — ABNORMAL LOW (ref 4.22–5.81)
RDW: 14.8 % (ref 11.5–15.5)
WBC: 8.6 10*3/uL (ref 4.0–10.5)
nRBC: 0 % (ref 0.0–0.2)

## 2021-07-02 LAB — MAGNESIUM: Magnesium: 2.2 mg/dL (ref 1.7–2.4)

## 2021-07-02 NOTE — Progress Notes (Signed)
Pulmonary [place]   PULMONARY CRITICAL CARE SERVICE  PROGRESS NOTE     Tony Young  DXA:[id number]  DOB: Dec 21, 1955   DOA: 06/10/2021  Referring Physician: Satira Sark, MD  HPI: Tony Young is a 66 y.o. male being followed for ventilator/airway/oxygen weaning Acute on Chronic Respiratory Failure.  Patient is capping as tolerated and will be going for 48 hours  Medications: Reviewed on Rounds  Physical Exam:  Vitals: Temperature is 97.6 pulse 120 respiratory rate 28 blood pressure is 108/64 saturations 94%  Ventilator Settings capping off the ventilator  General: Comfortable at this time Neck: supple Cardiovascular: no malignant arrhythmias Respiratory: Scattered coarse rhonchi are noted at this time. Skin: no rash seen on limited exam Musculoskeletal: No gross abnormality Psychiatric:unable to assess Neurologic:no involuntary movements         Lab Data:   Basic Metabolic Panel: Recent Labs  Lab 06/26/21 0349 06/27/21 0341 06/27/21 1205 06/29/21 0652 06/30/21 0403 06/30/21 1057 07/01/21 0803 07/02/21 0335  NA 145   < > 146* 152* 149*  --  144 139  K 3.7   < > 3.9 3.3* 3.4* 3.4* 3.8 4.0  CL 113*   < > 113* 117* 114*  --  110 107  CO2 23   < > 23 25 24   --  23 23  GLUCOSE 139*   < > 260* 192* 147*  --  171* 153*  BUN 72*   < > 59* 67* 74*  --  76* 69*  CREATININE 1.46*   < > 1.47* 1.54* 1.49*  --  1.40* 1.30*  CALCIUM 9.7   < > 10.0 10.2 10.4*  --  9.8 9.3  MG 2.1  --  2.2  --  2.2 2.1  --  2.2  PHOS 4.5  --   --   --  4.2  --  4.0 3.8   < > = values in this interval not displayed.    ABG: Recent Labs  Lab 06/25/21 1252  PHART 7.449  PCO2ART 32.5  PO2ART 115*  HCO3 22.2  O2SAT 98.9    Liver Function Tests: Recent Labs  Lab 06/26/21 0349 06/27/21 1205 06/30/21 0403 07/01/21 0803 07/02/21 0335  AST  --  21  --   --   --   ALT  --  27  --   --   --   ALKPHOS  --  137*   --   --   --   BILITOT  --  0.6  --   --   --   PROT  --  6.2*  --   --   --   ALBUMIN 2.8* 2.9* 2.6* 2.6* 2.6*   No results for input(s): LIPASE, AMYLASE in the last 168 hours. No results for input(s): AMMONIA in the last 168 hours.  CBC: Recent Labs  Lab 06/27/21 1205 06/29/21 0652 06/30/21 0403 07/01/21 0803 07/02/21 0335  WBC 8.7 7.6 8.3 8.8 8.6  NEUTROABS 7.4  --   --   --   --   HGB 9.6* 9.2* 8.7* 8.8* 8.7*  HCT 30.6* 29.6* 28.8* 28.5* 28.0*  MCV 91.3 92.8 92.6 91.9 90.3  PLT 128* 128* 129* 104* 107*    Cardiac Enzymes: No results for input(s): CKTOTAL, CKMB, CKMBINDEX, TROPONINI in the last 168 hours.  BNP (last 3 results) No results for input(s): BNP in the last 8760 hours.  ProBNP (last 3 results) No results for input(s): PROBNP in  the last 8760 hours.  Radiological Exams: IR Removal Tun Cv Cath W/O FL  Result Date: 07/01/2021 INDICATION: History of sepsis with acute kidney injury. Recovery of renal function and now requesting removal of tunneled hemodialysis catheter. The catheter was placed at outside facility. EXAM: REMOVAL OF TUNNELED HEMODIALYSIS CATHETER MEDICATIONS: 1% lidocaine with epinephrine 3 mL COMPLICATIONS: None immediate. PROCEDURE: Informed written consent was obtained from the patient following an explanation of the procedure, risks, benefits and alternatives to treatment. A time out was performed prior to the initiation of the procedure. Sterile technique was utilized including mask, sterile gloves, sterile drape, and hand hygiene. Betadine was used to prep the patient's right neck, chest and existing catheter. 1% lidocaine with epinephrine was injected around the catheter and the subcutaneous tunnel. Using only gentle traction the catheter was removed intact. Hemostasis was obtained with manual compression. A dressing was placed. The patient tolerated the procedure well without immediate post procedural complication. IMPRESSION: Successful removal of  tunneled dialysis catheter. Read by: Gareth Eagle, PA-C Electronically Signed   By: Ruthann Cancer M.D.   On: 07/01/2021 17:18   DG CHEST PORT 1 VIEW  Result Date: 07/01/2021 CLINICAL DATA:  Pneumonia.  Tracheostomy tube dependent. EXAM: PORTABLE CHEST 1 VIEW COMPARISON:  06/28/2021 FINDINGS: 0534 hours. Tracheostomy tube remains in place. Right IJ central line again noted. Vascular congestion seen previously has decreased in the interval. The cardio pericardial silhouette is enlarged. Telemetry leads overlie the chest. IMPRESSION: Interval improvement in vascular congestion/interstitial edema. Electronically Signed   By: Misty Stanley M.D.   On: 07/01/2021 06:05    Assessment/Plan Active Problems:   Acute on chronic respiratory failure with hypoxia (HCC)   Severe sepsis with septic shock (HCC)   Tracheostomy status (HCC)   Chronic heart failure with preserved ejection fraction (HCC)   AF (paroxysmal atrial fibrillation) (HCC)   Acute renal failure due to tubular necrosis ([place])   Acute on chronic respiratory failure hypoxia patient is doing well with capping will be completing 48 hours. Severe sepsis with shock hemodynamics are stable Tracheostomy doing well with capping trials Atrial fibrillation rate is controlled Acute renal failure tunneled catheter was removed today   I have personally seen and evaluated the patient, evaluated laboratory and imaging results, formulated the assessment and plan and placed orders. The Patient requires high complexity decision making with multiple systems involvement.  Rounds were done with the Respiratory Therapy Director and Staff therapists and discussed with nursing staff also.  [name], MD [facility] Pulmonary Critical Care Medicine Sleep Medicine

## 2021-07-03 DIAGNOSIS — N170 Acute kidney failure with tubular necrosis: Secondary | ICD-10-CM | POA: Diagnosis not present

## 2021-07-03 DIAGNOSIS — I480 Paroxysmal atrial fibrillation: Secondary | ICD-10-CM | POA: Diagnosis not present

## 2021-07-03 DIAGNOSIS — I5032 Chronic diastolic (congestive) heart failure: Secondary | ICD-10-CM | POA: Diagnosis not present

## 2021-07-03 DIAGNOSIS — J9621 Acute and chronic respiratory failure with hypoxia: Secondary | ICD-10-CM | POA: Diagnosis not present

## 2021-07-03 LAB — BLOOD GAS, ARTERIAL
Acid-Base Excess: 1.7 mmol/L (ref 0.0–2.0)
Bicarbonate: 25 mmol/L (ref 20.0–28.0)
FIO2: 21
O2 Saturation: 91.2 %
Patient temperature: 36.4
pCO2 arterial: 33.6 mmHg (ref 32.0–48.0)
pH, Arterial: 7.482 — ABNORMAL HIGH (ref 7.350–7.450)
pO2, Arterial: 56.3 mmHg — ABNORMAL LOW (ref 83.0–108.0)

## 2021-07-03 LAB — CULTURE, RESPIRATORY W GRAM STAIN

## 2021-07-03 LAB — BASIC METABOLIC PANEL
Anion gap: 11 (ref 5–15)
BUN: 64 mg/dL — ABNORMAL HIGH (ref 8–23)
CO2: 22 mmol/L (ref 22–32)
Calcium: 9.6 mg/dL (ref 8.9–10.3)
Chloride: 108 mmol/L (ref 98–111)
Creatinine, Ser: 1.4 mg/dL — ABNORMAL HIGH (ref 0.61–1.24)
GFR, Estimated: 56 mL/min — ABNORMAL LOW (ref >60)
Glucose, Bld: 104 mg/dL — ABNORMAL HIGH (ref 70–99)
Potassium: 3.7 mmol/L (ref 3.5–5.1)
Sodium: 141 mmol/L (ref 135–145)

## 2021-07-03 NOTE — Progress Notes (Signed)
Pulmonary [place]   PULMONARY CRITICAL CARE SERVICE  PROGRESS NOTE     Tony Young  MVH:[id number]  DOB: Aug 16, 1955   DOA: 06/10/2021  Referring Physician: Satira Sark, MD  HPI: Tony Young is a 66 y.o. male being followed for ventilator/airway/oxygen weaning Acute on Chronic Respiratory Failure.  Patient is capping will be completing 72 hours doing well  Medications: Reviewed on Rounds  Physical Exam:  Vitals: Temperature is 97.1 pulse 84 respiratory 20 blood pressure is 123/66 saturations 97%  Ventilator Settings capping off the ventilator  General: Comfortable at this time Neck: supple Cardiovascular: no malignant arrhythmias Respiratory: No rhonchi no rales are noted at this time Skin: no rash seen on limited exam Musculoskeletal: No gross abnormality Psychiatric:unable to assess Neurologic:no involuntary movements         Lab Data:   Basic Metabolic Panel: Recent Labs  Lab 06/27/21 1205 06/29/21 0652 06/30/21 0403 06/30/21 1057 07/01/21 0803 07/02/21 0335 07/03/21 0615  NA 146* 152* 149*  --  144 139 141  K 3.9 3.3* 3.4* 3.4* 3.8 4.0 3.7  CL 113* 117* 114*  --  110 107 108  CO2 23 25 24   --  23 23 22   GLUCOSE 260* 192* 147*  --  171* 153* 104*  BUN 59* 67* 74*  --  76* 69* 64*  CREATININE 1.47* 1.54* 1.49*  --  1.40* 1.30* 1.40*  CALCIUM 10.0 10.2 10.4*  --  9.8 9.3 9.6  MG 2.2  --  2.2 2.1  --  2.2  --   PHOS  --   --  4.2  --  4.0 3.8  --     ABG: No results for input(s): PHART, PCO2ART, PO2ART, HCO3, O2SAT in the last 168 hours.  Liver Function Tests: Recent Labs  Lab 06/27/21 1205 06/30/21 0403 07/01/21 0803 07/02/21 0335  AST 21  --   --   --   ALT 27  --   --   --   ALKPHOS 137*  --   --   --   BILITOT 0.6  --   --   --   PROT 6.2*  --   --   --   ALBUMIN 2.9* 2.6* 2.6* 2.6*   No results for input(s): LIPASE, AMYLASE in the last 168 hours. No results for  input(s): AMMONIA in the last 168 hours.  CBC: Recent Labs  Lab 06/27/21 1205 06/29/21 0652 06/30/21 0403 07/01/21 0803 07/02/21 0335  WBC 8.7 7.6 8.3 8.8 8.6  NEUTROABS 7.4  --   --   --   --   HGB 9.6* 9.2* 8.7* 8.8* 8.7*  HCT 30.6* 29.6* 28.8* 28.5* 28.0*  MCV 91.3 92.8 92.6 91.9 90.3  PLT 128* 128* 129* 104* 107*    Cardiac Enzymes: No results for input(s): CKTOTAL, CKMB, CKMBINDEX, TROPONINI in the last 168 hours.  BNP (last 3 results) No results for input(s): BNP in the last 8760 hours.  ProBNP (last 3 results) No results for input(s): PROBNP in the last 8760 hours.  Radiological Exams: IR Removal Tun Cv Cath W/O FL  Result Date: 07/01/2021 INDICATION: History of sepsis with acute kidney injury. Recovery of renal function and now requesting removal of tunneled hemodialysis catheter. The catheter was placed at outside facility. EXAM: REMOVAL OF TUNNELED HEMODIALYSIS CATHETER MEDICATIONS: 1% lidocaine with epinephrine 3 mL COMPLICATIONS: None immediate. PROCEDURE: Informed written consent was obtained from the patient following an explanation of the procedure,  risks, benefits and alternatives to treatment. A time out was performed prior to the initiation of the procedure. Sterile technique was utilized including mask, sterile gloves, sterile drape, and hand hygiene. Betadine was used to prep the patient's right neck, chest and existing catheter. 1% lidocaine with epinephrine was injected around the catheter and the subcutaneous tunnel. Using only gentle traction the catheter was removed intact. Hemostasis was obtained with manual compression. A dressing was placed. The patient tolerated the procedure well without immediate post procedural complication. IMPRESSION: Successful removal of tunneled dialysis catheter. Read by: Tony Eagle, PA-C Electronically Signed   By: Tony Young M.D.   On: 07/01/2021 17:18    Assessment/Plan Active Problems:   Acute on chronic respiratory  failure with hypoxia (HCC)   Severe sepsis with septic shock (HCC)   Tracheostomy status (HCC)   Chronic heart failure with preserved ejection fraction (HCC)   AF (paroxysmal atrial fibrillation) (HCC)   Acute renal failure due to tubular necrosis ([place])   Acute on chronic respiratory failure with hypoxia patient is doing relatively well with capping trials is ready for decannulation but I would like for him to use the BiPAP and also will check an ABG on him Severe sepsis with shock resolved hemodynamics are stable. Tracheostomy doing fine with capping Chronic heart failure preserved ejection fraction supportive care monitor fluid status Acute renal failure resolved   I have personally seen and evaluated the patient, evaluated laboratory and imaging results, formulated the assessment and plan and placed orders. The Patient requires high complexity decision making with multiple systems involvement.  Rounds were done with the Respiratory Therapy Director and Staff therapists and discussed with nursing staff also.  Tony Gee, MD [facility] Pulmonary Critical Care Medicine Sleep Medicine

## 2021-07-04 LAB — RENAL FUNCTION PANEL
Albumin: 2.5 g/dL — ABNORMAL LOW (ref 3.5–5.0)
Anion gap: 11 (ref 5–15)
BUN: 63 mg/dL — ABNORMAL HIGH (ref 8–23)
CO2: 24 mmol/L (ref 22–32)
Calcium: 9.9 mg/dL (ref 8.9–10.3)
Chloride: 106 mmol/L (ref 98–111)
Creatinine, Ser: 1.46 mg/dL — ABNORMAL HIGH (ref 0.61–1.24)
GFR, Estimated: 53 mL/min — ABNORMAL LOW (ref >60)
Glucose, Bld: 170 mg/dL — ABNORMAL HIGH (ref 70–99)
Phosphorus: 5.6 mg/dL — ABNORMAL HIGH (ref 2.5–4.6)
Potassium: 4.1 mmol/L (ref 3.5–5.1)
Sodium: 141 mmol/L (ref 135–145)

## 2021-07-04 LAB — CBC
HCT: 28.3 % — ABNORMAL LOW (ref 39.0–52.0)
Hemoglobin: 8.7 g/dL — ABNORMAL LOW (ref 13.0–17.0)
MCH: 28.5 pg (ref 26.0–34.0)
MCHC: 30.7 g/dL (ref 30.0–36.0)
MCV: 92.8 fL (ref 80.0–100.0)
Platelets: 72 10*3/uL — ABNORMAL LOW (ref 150–400)
RBC: 3.05 MIL/uL — ABNORMAL LOW (ref 4.22–5.81)
RDW: 15.2 % (ref 11.5–15.5)
WBC: 7.1 10*3/uL (ref 4.0–10.5)
nRBC: 0 % (ref 0.0–0.2)

## 2021-07-04 LAB — CULTURE, BLOOD (ROUTINE X 2)
Culture: NO GROWTH
Culture: NO GROWTH
Special Requests: ADEQUATE
Special Requests: ADEQUATE

## 2021-07-04 LAB — MAGNESIUM: Magnesium: 2.2 mg/dL (ref 1.7–2.4)

## 2021-07-05 DIAGNOSIS — J9621 Acute and chronic respiratory failure with hypoxia: Secondary | ICD-10-CM | POA: Diagnosis not present

## 2021-07-05 DIAGNOSIS — I480 Paroxysmal atrial fibrillation: Secondary | ICD-10-CM | POA: Diagnosis not present

## 2021-07-05 DIAGNOSIS — I5032 Chronic diastolic (congestive) heart failure: Secondary | ICD-10-CM | POA: Diagnosis not present

## 2021-07-05 DIAGNOSIS — N170 Acute kidney failure with tubular necrosis: Secondary | ICD-10-CM | POA: Diagnosis not present

## 2021-07-05 LAB — RENAL FUNCTION PANEL
Albumin: 2.5 g/dL — ABNORMAL LOW (ref 3.5–5.0)
Anion gap: 11 (ref 5–15)
BUN: 64 mg/dL — ABNORMAL HIGH (ref 8–23)
CO2: 25 mmol/L (ref 22–32)
Calcium: 9.9 mg/dL (ref 8.9–10.3)
Chloride: 106 mmol/L (ref 98–111)
Creatinine, Ser: 1.46 mg/dL — ABNORMAL HIGH (ref 0.61–1.24)
GFR, Estimated: 53 mL/min — ABNORMAL LOW (ref >60)
Glucose, Bld: 197 mg/dL — ABNORMAL HIGH (ref 70–99)
Phosphorus: 4.7 mg/dL — ABNORMAL HIGH (ref 2.5–4.6)
Potassium: 4.5 mmol/L (ref 3.5–5.1)
Sodium: 142 mmol/L (ref 135–145)

## 2021-07-05 LAB — CBC
HCT: 27.1 % — ABNORMAL LOW (ref 39.0–52.0)
Hemoglobin: 8.5 g/dL — ABNORMAL LOW (ref 13.0–17.0)
MCH: 28.2 pg (ref 26.0–34.0)
MCHC: 31.4 g/dL (ref 30.0–36.0)
MCV: 90 fL (ref 80.0–100.0)
Platelets: 111 10*3/uL — ABNORMAL LOW (ref 150–400)
RBC: 3.01 MIL/uL — ABNORMAL LOW (ref 4.22–5.81)
RDW: 14.9 % (ref 11.5–15.5)
WBC: 6.4 10*3/uL (ref 4.0–10.5)
nRBC: 0 % (ref 0.0–0.2)

## 2021-07-05 LAB — MAGNESIUM: Magnesium: 2.2 mg/dL (ref 1.7–2.4)

## 2021-07-05 NOTE — Progress Notes (Signed)
Pulmonary [place]   PULMONARY CRITICAL CARE SERVICE  PROGRESS NOTE     Tony Young  [id number]:[id number]  DOB: [birth year]   DOA: 06/10/2021  Referring Physician: Satira Sark, MD  HPI: Tony Young is a 66 y.o. male being followed for ventilator/airway/oxygen weaning Acute on Chronic Respiratory Failure.  Patient scattered low-grade temperature now is better this morning afebrile  Medications: Reviewed on Rounds  Physical Exam:  Vitals: Temperature is 97.7 pulse 103 respiratory is 18 blood pressure is 139/63 saturations 100%  Ventilator Settings capping on 1 L oxygen using BiPAP at night  General: Comfortable at this time Neck: supple Cardiovascular: no malignant arrhythmias Respiratory: No rhonchi very coarse breath sounds Skin: no rash seen on limited exam Musculoskeletal: No gross abnormality Psychiatric:unable to assess Neurologic:no involuntary movements         Lab Data:   Basic Metabolic Panel: Recent Labs  Lab 06/30/21 0403 06/30/21 1057 07/01/21 0803 07/02/21 0335 07/03/21 0615 07/04/21 0442 07/05/21 0319  NA 149*  --  144 139 141 141 142  K 3.4* 3.4* 3.8 4.0 3.7 4.1 4.5  CL 114*  --  110 107 108 106 106  CO2 24  --  23 23 22 24 25   GLUCOSE 147*  --  171* 153* 104* 170* 197*  BUN 74*  --  76* 69* 64* 63* 64*  CREATININE 1.49*  --  1.40* 1.30* 1.40* 1.46* 1.46*  CALCIUM 10.4*  --  9.8 9.3 9.6 9.9 9.9  MG 2.2 2.1  --  2.2  --  2.2 2.2  PHOS 4.2  --  4.0 3.8  --  5.6* 4.7*    ABG: Recent Labs  Lab 07/03/21 0950  PHART 7.482*  PCO2ART 33.6  PO2ART 56.3*  HCO3 25.0  O2SAT 91.2    Liver Function Tests: Recent Labs  Lab 06/30/21 0403 07/01/21 0803 07/02/21 0335 07/04/21 0442 07/05/21 0319  ALBUMIN 2.6* 2.6* 2.6* 2.5* 2.5*   No results for input(s): LIPASE, AMYLASE in the last 168 hours. No results for input(s): AMMONIA in the last 168 hours.  CBC: Recent Labs  Lab  06/30/21 0403 07/01/21 0803 07/02/21 0335 07/04/21 0442 07/05/21 0319  WBC 8.3 8.8 8.6 7.1 6.4  HGB 8.7* 8.8* 8.7* 8.7* 8.5*  HCT 28.8* 28.5* 28.0* 28.3* 27.1*  MCV 92.6 91.9 90.3 92.8 90.0  PLT 129* 104* 107* 72* 111*    Cardiac Enzymes: No results for input(s): CKTOTAL, CKMB, CKMBINDEX, TROPONINI in the last 168 hours.  BNP (last 3 results) No results for input(s): BNP in the last 8760 hours.  ProBNP (last 3 results) No results for input(s): PROBNP in the last 8760 hours.  Radiological Exams: No results found.  Assessment/Plan Active Problems:   Acute on chronic respiratory failure with hypoxia (HCC)   Severe sepsis with septic shock (HCC)   Tracheostomy status (HCC)   Chronic heart failure with preserved ejection fraction (HCC)   AF (paroxysmal atrial fibrillation) (HCC)   Acute renal failure due to tubular necrosis (HCC)   Acute on chronic respiratory failure hypoxia plan is going to be to continue with capping trials titrate oxygen as tolerated continue pulmonary toilet Severe sepsis with shock hemodynamics are stable Chronic heart failure preserved ejection fraction no change Tracheostomy remains in place Atrial fibrillation rate is controlled   I have personally seen and evaluated the patient, evaluated laboratory and imaging results, formulated the assessment and plan and placed orders. The Patient requires high complexity decision  making with multiple systems involvement.  Rounds were done with the Respiratory Therapy Director and Staff therapists and discussed with nursing staff also.  [name], MD [facility] Pulmonary Critical Care Medicine Sleep Medicine

## 2021-07-05 NOTE — Progress Notes (Signed)
Infectious Disease Progress Note   Tony Young  QVZ:[id number]  DOB: 1956/01/16  DOA: 06/10/2021  Brief History: Tony Young is an 66 y.o. male with medical history significant for COPD, congestive heart failure, diabetes mellitus, coronary artery disease who was admitted to the acute facility on 05/13/2021 with complaints of worsening shortness of breath, fever, found to have necrotizing fasciitis of the left lower extremity.  He was started on broad-spectrum antimicrobials. He underwent multiple debridements.  Hospital course was complicated by acute kidney injury needing dialysis, respiratory failure, sepsis with shock, GI bleed, bacteremia.  He was seen by GI, wound care, cardiology, general surgery, nephrology, infectious disease.  He was initially treated with IV vancomycin.  However, due to the renal failure vancomycin was discontinued and he was started on doxycycline.  He also had respiratory failure due to sepsis and shock and was placed on mechanical ventilation and ended up with a tracheostomy.  He was eventually stabilized and transferred to [facility].  After admission here he apparently had fever of 102.3.  His antibiotics were broadened to meropenem.  He tolerated the meropenem well.  He had repeat respiratory cultures from 06/30/2021 that showed moderate Staph aureus, MRSA and rare Candida auris.  Physical Exam: Vitals: Temperature 97.7, heart rate 103, respiratory rate 18, blood pressure 139/63, oxygen saturation 100%.    Constitutional: Ill-appearing male, awake Head: Atraumatic, normocephalic Eyes: PERLA ENMT: external ears and nose appear normal, normal hearing, no ear or nose lesions, moist oral mucosa            Neck: Has trach in place CVS: S1-S2   Respiratory: Decreased breath sounds lower lobes, scattered rhonchi Abdomen: soft nontender, nondistended, normal bowel sounds  Musculoskeletal: Lower extremity edema  Neuro:  Following few commands, debility with generalized weakness  Psych: stable mood and affect, mental status Skin: no rashes, unstageable sacrococcygeal pressure ulcer, left lower extremity wound with dressing  Data reviewed:  I have personally reviewed following labs and imaging studies Labs:  CBC: Recent Labs  Lab 06/30/21 0403 07/01/21 0803 07/02/21 0335 07/04/21 0442 07/05/21 0319  WBC 8.3 8.8 8.6 7.1 6.4  HGB 8.7* 8.8* 8.7* 8.7* 8.5*  HCT 28.8* 28.5* 28.0* 28.3* 27.1*  MCV 92.6 91.9 90.3 92.8 90.0  PLT 129* 104* 107* 72* 111*     Basic Metabolic Panel: Recent Labs  Lab 06/30/21 0403 06/30/21 1057 07/01/21 0803 07/02/21 0335 07/03/21 0615 07/04/21 0442 07/05/21 0319  NA 149*  --  144 139 141 141 142  K 3.4* 3.4* 3.8 4.0 3.7 4.1 4.5  CL 114*  --  110 107 108 106 106  CO2 24  --  23 23 22 24 25   GLUCOSE 147*  --  171* 153* 104* 170* 197*  BUN 74*  --  76* 69* 64* 63* 64*  CREATININE 1.49*  --  1.40* 1.30* 1.40* 1.46* 1.46*  CALCIUM 10.4*  --  9.8 9.3 9.6 9.9 9.9  MG 2.2 2.1  --  2.2  --  2.2 2.2  PHOS 4.2  --  4.0 3.8  --  5.6* 4.7*    GFR CrCl cannot be calculated (Unknown ideal weight.). Liver Function Tests: Recent Labs  Lab 06/30/21 0403 07/01/21 0803 07/02/21 0335 07/04/21 0442 07/05/21 0319  ALBUMIN 2.6* 2.6* 2.6* 2.5* 2.5*    No results for input(s): LIPASE, AMYLASE in the last 168 hours. No results for input(s): AMMONIA in the last 168  hours. Coagulation profile No results for input(s): INR, PROTIME in the last 168 hours.  Cardiac Enzymes: No results for input(s): CKTOTAL, CKMB, CKMBINDEX, TROPONINI in the last 168 hours. BNP: Invalid input(s): POCBNP CBG: No results for input(s): GLUCAP in the last 168 hours. D-Dimer No results for input(s): DDIMER in the last 72 hours. Hgb A1c No results for input(s): HGBA1C in the last 72 hours. Lipid Profile No results for input(s): CHOL, HDL, LDLCALC, TRIG, CHOLHDL, LDLDIRECT in the last 72  hours.  Thyroid function studies No results for input(s): TSH, T4TOTAL, T3FREE, THYROIDAB in the last 72 hours.  Invalid input(s): FREET3  Anemia work up No results for input(s): VITAMINB12, FOLATE, FERRITIN, TIBC, IRON, RETICCTPCT in the last 72 hours. Urinalysis    Component Value Date/Time   COLORURINE YELLOW 06/29/2021 1129   APPEARANCEUR CLEAR 06/29/2021 1129   LABSPEC 1.010 06/29/2021 1129   PHURINE 5.5 06/29/2021 1129   GLUCOSEU >=500 (A) 06/29/2021 1129   HGBUR SMALL (A) 06/29/2021 1129   BILIRUBINUR NEGATIVE 06/29/2021 1129   KETONESUR NEGATIVE 06/29/2021 1129   PROTEINUR NEGATIVE 06/29/2021 1129   NITRITE NEGATIVE 06/29/2021 1129   LEUKOCYTESUR NEGATIVE 06/29/2021 1129   Sepsis Labs Invalid input(s): PROCALCITONIN,  WBC,  LACTICIDVEN Microbiology Recent Results (from the past 240 hour(s))  Urine Culture     Status: None   Collection Time: 06/29/21 11:29 AM   Specimen: In/Out Cath Urine  Result Value Ref Range Status   Specimen Description IN/OUT CATH URINE  Final   Special Requests NONE  Final   Culture   Final    NO GROWTH Performed at [facility], [place] [address], [place], [place] [address]    Report Status 07/01/2021 FINAL  Final  Culture, blood (routine x 2)     Status: None   Collection Time: 06/29/21 12:04 PM   Specimen: BLOOD LEFT HAND  Result Value Ref Range Status   Specimen Description BLOOD LEFT HAND  Final   Special Requests   Final    BOTTLES DRAWN AEROBIC AND ANAEROBIC Blood Culture adequate volume   Culture   Final    NO GROWTH 5 DAYS Performed at [facility], [place] [address] [place], [place] [address]    Report Status 07/04/2021 FINAL  Final  Culture, blood (routine x 2)     Status: None   Collection Time: 06/29/21 12:12 PM   Specimen: BLOOD  Result Value Ref Range Status   Specimen Description BLOOD LEFT ANTECUBITAL  Final   Special Requests   Final    BOTTLES DRAWN AEROBIC AND ANAEROBIC Blood Culture adequate volume    Culture   Final    NO GROWTH 5 DAYS Performed at [facility], [place] [address], [place], [place] [address]    Report Status 07/04/2021 FINAL  Final  Culture, Respiratory w Gram Stain     Status: None   Collection Time: 06/30/21  9:40 AM   Specimen: Tracheal Aspirate; Respiratory  Result Value Ref Range Status   Specimen Description TRACHEAL ASPIRATE  Final   Special Requests NONE  Final   Gram Stain   Final    ABUNDANT WBC PRESENT, PREDOMINANTLY PMN ABUNDANT GRAM POSITIVE COCCI IN PAIRS IN CLUSTERS    Culture   Final    MODERATE STAPHYLOCOCCUS AUREUS RARE CANDIDA AURIS Performed at [facility], [address] [place], [place] [address]    Report Status 07/03/2021 FINAL  Final   Organism ID, Bacteria STAPHYLOCOCCUS AUREUS  Final      Susceptibility   Staphylococcus aureus - MIC*    CIPROFLOXACIN >=8 RESISTANT Resistant     ERYTHROMYCIN RESISTANT Resistant     GENTAMICIN <=0.5 SENSITIVE Sensitive     OXACILLIN >=4 RESISTANT Resistant     TETRACYCLINE <=1 SENSITIVE Sensitive     VANCOMYCIN 1 SENSITIVE Sensitive     TRIMETH/SULFA <=10 SENSITIVE Sensitive     CLINDAMYCIN RESISTANT Resistant     RIFAMPIN <=0.5 SENSITIVE Sensitive     Inducible Clindamycin POSITIVE Resistant     * MODERATE STAPHYLOCOCCUS AUREUS  C Difficile Quick Screen (NO PCR Reflex)     Status: None   Collection Time: 07/01/21 11:05 AM   Specimen: STOOL  Result Value Ref Range Status   C Diff antigen NEGATIVE NEGATIVE Final   C Diff toxin NEGATIVE NEGATIVE Final   C Diff interpretation No C. difficile detected.  Final    Comment: Performed at [facility], [place] [address] [place], [place] [address]    Inpatient Medications:   Scheduled Meds: Please see MAR   Radiological Exams on Admission: No results found.  Impression/Recommendations Active Problems: Acute on chronic hypoxemic respiratory failure Pneumonia due to MRSA Fever Left lower extremity necrotizing fasciitis Sepsis  with shock, currently shock resolved Pulmonary edema/pleural effusion Acute kidney injury Thrombocytopenia Anemia/GI bleed Diabetes mellitus type 2 Congestive heart failure, systolic with EF 32-35% Atrial fibrillation Encephalopathy, likely toxic/metabolic Sacrococcygeal pressure ulcer, unstageable Dysphagia/protein calorie malnutrition History of colon cancer status post resection History of NSTEMI  Acute on chronic hypoxemic respiratory failure: This was in the setting of sepsis with septic shock and organ dysfunction.  Chest x-ray showed findings concerning for pneumonia. Respiratory cultures from 06/15/2021 resulted as normal respiratory flora which is highly concerning for aspiration.  He was previously on doxycycline, Flagyl.  However, due to the fevers and pneumonia broadened to meropenem.  Although he has an allergy listed to penicillin, he tolerated the meropenem.  Recent respiratory cultures from 06/30/2021 showed MRSA.  Reluctant to use IV vancomycin because of recent acute kidney injury.  Recommend to treat with Zyvox.  Will plan to treat for duration of 1 week pending improvement.   However, if he starts having worsening respiratory failure or continues to have worsening fevers or leukocytosis then recommend repeat chest imaging preferably chest CT which can be done without contrast given the recent renal failure.  As mentioned above, he has dysphagia and high risk for ongoing aspiration and recurrent aspiration pneumonia despite being on antibiotics.  Please monitor BUN/creatinine, CBC closely while on antibiotics.  Pneumonia: He already completed treatment with meropenem.  Recent respiratory cultures now showing MRSA, rare Candida auris.  Antibiotics and plan as mentioned above.  Unfortunately he has dysphagia and high risk for aspiration and recurrent aspiration pneumonia despite being on antibiotics.  Left lower extremity necrotizing fasciitis: He had sepsis secondary to the  necrotizing fasciitis at the acute facility with shock.  Currently off pressors and shock is resolved.  He underwent multiple debridements at the acute facility.  Continue local wound care.  On antibiotics as mentioned above.  Initial plan was for the antibiotics to end on 06/20/2021.  However, in the setting of fever and concern for pneumonia extended to 06/22/2021.  He completed treatment with meropenem.  Now on Zyvox due to respiratory culture showing MRSA.  If the wound is worsening, recommend imaging with CT/ MRI and consult surgery if needed.  Fever: Multifactorial etiology.  He had left lower  extremity necrotizing fasciitis status post debridement.  He also has unstageable sacrococcygeal pressure ulcer and has pneumonia with cultures as mentioned above.  Antibiotics as mentioned above.   Continue to monitor.  Sepsis with septic shock: He was on pressors and broad-spectrum antimicrobials at the acute facility.  The sepsis with shock was secondary to left lower extremity necrotizing fasciitis.  He underwent multiple debridements.  Currently shock is resolved.  However, he is high risk for recurrent sepsis.  On antibiotics as mentioned above.  If he starts having any worsening fevers or worsening leukocytosis recommend to repeat imaging, CT of the chest/abdomen/pelvis.  Pulmonary edema/pleural effusion: Further management per the primary team.  Acute kidney injury: Patient apparently had acute kidney injury likely secondary to ATN in the setting of septic shock.  He underwent CRRT/hemodialysis at the acute facility.  Nephrology consulted by the primary team.  Currently hemodialysis is on hold.  He apparently was on IV vancomycin at the acute facility which had to be discontinued due to the AKI.  Would avoid IV vancomycin in this patient with renal failure. Continue to monitor BUN/creatinine closely.  Further management per primary team.  Avoid nephrotoxic medications.  Thrombocytopenia: Patient noted to  have low platelet count.  Platelet count has been low but fluctuating even prior to initiating Zyvox.  Thrombocytopenia likely unrelated to the Zyvox.  Reluctant to use IV vancomycin due to recent AKI.  Continue to monitor the platelet count closely.  Anemia/GI bleed: He is status post PRBCs at the outside hospital.  Continue to monitor hemoglobin.  Further investigation and management per primary team.  Diabetes mellitus type 2: Continue medication and management per primary team.  Will need proper glycemic control in order to enable wound healing.  Congestive heart failure: Likely systolic.  He had an echo at the acute facility with reported EF 30-35% with mild to moderate mitral regurgitation, reduced ventricular function.  On IV Lasix.  Continue medications and management per the primary team.  Atrial fibrillation: Continue medications and management per primary team.  He is on amiodarone and metoprolol.  Encephalopathy: Likely toxic/metabolic in the setting of septic shock with necrotizing fasciitis.  Head CT at the acute facility was negative.  On antibiotics as mentioned above.  Continue to monitor.  Further management per the primary team.  Sacrococcygeal pressure ulcer: Patient unfortunately due to his bedbound status and immobility developing sacrococcygeal pressure ulcer, currently unstageable.  On antibiotics as mentioned above.  Continue to monitor.  Continue local wound care.  If worsening, consider imaging and consulting surgery to evaluate for debridement.  Dysphagia/protein calorie malnutrition: Due to the dysphagia he is high risk for recurrent aspiration and aspiration pneumonia.  Management of protein calorie malnutrition per the primary team.  History of colon cancer s/p resection: Follow-up outpatient.  NSTEMI: On aspirin.  Further management per the primary team.  Unfortunately due to his multiple complex medical problems he is high risk for worsening and  decompensation.  Plan of care discussed with the primary team and pharmacy.  Yaakov Guthrie M.D. 07/05/2021, 10:43 PM

## 2021-07-06 ENCOUNTER — Other Ambulatory Visit (HOSPITAL_COMMUNITY): Payer: Medicare Other

## 2021-07-06 DIAGNOSIS — I480 Paroxysmal atrial fibrillation: Secondary | ICD-10-CM | POA: Diagnosis not present

## 2021-07-06 DIAGNOSIS — I5032 Chronic diastolic (congestive) heart failure: Secondary | ICD-10-CM | POA: Diagnosis not present

## 2021-07-06 DIAGNOSIS — J9621 Acute and chronic respiratory failure with hypoxia: Secondary | ICD-10-CM | POA: Diagnosis not present

## 2021-07-06 DIAGNOSIS — N170 Acute kidney failure with tubular necrosis: Secondary | ICD-10-CM | POA: Diagnosis not present

## 2021-07-06 LAB — RENAL FUNCTION PANEL
Albumin: 2.6 g/dL — ABNORMAL LOW (ref 3.5–5.0)
Anion gap: 14 (ref 5–15)
BUN: 69 mg/dL — ABNORMAL HIGH (ref 8–23)
CO2: 26 mmol/L (ref 22–32)
Calcium: 9.9 mg/dL (ref 8.9–10.3)
Chloride: 106 mmol/L (ref 98–111)
Creatinine, Ser: 1.34 mg/dL — ABNORMAL HIGH (ref 0.61–1.24)
GFR, Estimated: 59 mL/min — ABNORMAL LOW (ref >60)
Glucose, Bld: 94 mg/dL (ref 70–99)
Phosphorus: 4.6 mg/dL (ref 2.5–4.6)
Potassium: 3.5 mmol/L (ref 3.5–5.1)
Sodium: 146 mmol/L — ABNORMAL HIGH (ref 135–145)

## 2021-07-06 LAB — CBC
HCT: 28.9 % — ABNORMAL LOW (ref 39.0–52.0)
Hemoglobin: 8.9 g/dL — ABNORMAL LOW (ref 13.0–17.0)
MCH: 28.3 pg (ref 26.0–34.0)
MCHC: 30.8 g/dL (ref 30.0–36.0)
MCV: 92 fL (ref 80.0–100.0)
Platelets: 118 10*3/uL — ABNORMAL LOW (ref 150–400)
RBC: 3.14 MIL/uL — ABNORMAL LOW (ref 4.22–5.81)
RDW: 15.2 % (ref 11.5–15.5)
WBC: 6.5 10*3/uL (ref 4.0–10.5)
nRBC: 0 % (ref 0.0–0.2)

## 2021-07-06 LAB — MAGNESIUM: Magnesium: 2.2 mg/dL (ref 1.7–2.4)

## 2021-07-06 NOTE — Progress Notes (Signed)
Pulmonary [place]   PULMONARY CRITICAL CARE SERVICE  PROGRESS NOTE     Tony Young  [id number]:[id number]  DOB: [birth year]   DOA: 06/10/2021  Referring Physician: Satira Sark, MD  HPI: Tony Young is a 66 y.o. male being followed for ventilator/airway/oxygen weaning Acute on Chronic Respiratory Failure.  Patient is afebrile resting comfortably without distress has been capping on 6 L and reportedly minimal secretions  Medications: Reviewed on Rounds  Physical Exam:  Vitals: Temperature 98.9 pulse 110 respiratory is 18 blood pressure is 128/68 saturations 100%  Ventilator Settings capping off the ventilator.  General: Comfortable at this time Neck: supple Cardiovascular: no malignant arrhythmias Respiratory: No rhonchi no rales are noted Skin: no rash seen on limited exam Musculoskeletal: No gross abnormality Psychiatric:unable to assess Neurologic:no involuntary movements         Lab Data:   Basic Metabolic Panel: Recent Labs  Lab 06/30/21 1057 07/01/21 0803 07/02/21 0335 07/03/21 0615 07/04/21 0442 07/05/21 0319 07/06/21 0743  NA  --  144 139 141 141 142 146*  K 3.4* 3.8 4.0 3.7 4.1 4.5 3.5  CL  --  110 107 108 106 106 106  CO2  --  23 23 22 24 25 26   GLUCOSE  --  171* 153* 104* 170* 197* 94  BUN  --  76* 69* 64* 63* 64* 69*  CREATININE  --  1.40* 1.30* 1.40* 1.46* 1.46* 1.34*  CALCIUM  --  9.8 9.3 9.6 9.9 9.9 9.9  MG 2.1  --  2.2  --  2.2 2.2 2.2  PHOS  --  4.0 3.8  --  5.6* 4.7* 4.6    ABG: Recent Labs  Lab 07/03/21 0950  PHART 7.482*  PCO2ART 33.6  PO2ART 56.3*  HCO3 25.0  O2SAT 91.2    Liver Function Tests: Recent Labs  Lab 07/01/21 0803 07/02/21 0335 07/04/21 0442 07/05/21 0319 07/06/21 0743  ALBUMIN 2.6* 2.6* 2.5* 2.5* 2.6*   No results for input(s): LIPASE, AMYLASE in the last 168 hours. No results for input(s): AMMONIA in the last 168 hours.  CBC: Recent Labs   Lab 07/01/21 0803 07/02/21 0335 07/04/21 0442 07/05/21 0319 07/06/21 0743  WBC 8.8 8.6 7.1 6.4 6.5  HGB 8.8* 8.7* 8.7* 8.5* 8.9*  HCT 28.5* 28.0* 28.3* 27.1* 28.9*  MCV 91.9 90.3 92.8 90.0 92.0  PLT 104* 107* 72* 111* 118*    Cardiac Enzymes: No results for input(s): CKTOTAL, CKMB, CKMBINDEX, TROPONINI in the last 168 hours.  BNP (last 3 results) No results for input(s): BNP in the last 8760 hours.  ProBNP (last 3 results) No results for input(s): PROBNP in the last 8760 hours.  Radiological Exams: DG Chest Port 1 View  Result Date: 07/06/2021 CLINICAL DATA:  66 year old male with tracheostomy. Removed dialysis catheter. EXAM: PORTABLE CHEST 1 VIEW COMPARISON:  Portable chest 07/01/2021 and earlier. FINDINGS: Portable AP semi upright view at 0540 hours. Right chest dual lumen catheter removed. Tracheostomy remains in place, stable. Mildly lower lung volumes. Stable cardiac size and mediastinal contours. No pneumothorax, pleural effusion, consolidation, or pleural effusion. Mild perihilar atelectasis suspected and stable. No new pulmonary opacity. No acute osseous abnormality identified. Paucity of bowel gas in the visible upper abdomen. IMPRESSION: 1. Right chest dialysis catheter removed. 2. No acute cardiopulmonary abnormality.  Stable mild atelectasis. Electronically Signed   By: Genevie Ann M.D.   On: 07/06/2021 06:08    Assessment/Plan Active Problems:   Acute on chronic  respiratory failure with hypoxia (HCC)   Severe sepsis with septic shock (HCC)   Tracheostomy status (HCC)   Chronic heart failure with preserved ejection fraction (HCC)   AF (paroxysmal atrial fibrillation) (HCC)   Acute renal failure due to tubular necrosis ([place])   Acute on chronic respiratory failure hypoxia we will continue with the capping trials continue secretion management pulmonary toilet.  Patient's secretions are actually minimal Severe sepsis treated resolved hemodynamics are stable Chronic heart  failure preserved ejection fraction we will continue to follow along closely Atrial fibrillation rate is controlled at this time Acute tubular necrosis no change we will continue with supportive care   I have personally seen and evaluated the patient, evaluated laboratory and imaging results, formulated the assessment and plan and placed orders. The Patient requires high complexity decision making with multiple systems involvement.  Rounds were done with the Respiratory Therapy Director and Staff therapists and discussed with nursing staff also.  [name], MD [facility] Pulmonary Critical Care Medicine Sleep Medicine

## 2021-07-07 DIAGNOSIS — J9621 Acute and chronic respiratory failure with hypoxia: Secondary | ICD-10-CM | POA: Diagnosis not present

## 2021-07-07 DIAGNOSIS — I480 Paroxysmal atrial fibrillation: Secondary | ICD-10-CM | POA: Diagnosis not present

## 2021-07-07 DIAGNOSIS — I5032 Chronic diastolic (congestive) heart failure: Secondary | ICD-10-CM | POA: Diagnosis not present

## 2021-07-07 DIAGNOSIS — N170 Acute kidney failure with tubular necrosis: Secondary | ICD-10-CM | POA: Diagnosis not present

## 2021-07-07 LAB — BASIC METABOLIC PANEL
Anion gap: 13 (ref 5–15)
BUN: 76 mg/dL — ABNORMAL HIGH (ref 8–23)
CO2: 26 mmol/L (ref 22–32)
Calcium: 10.2 mg/dL (ref 8.9–10.3)
Chloride: 106 mmol/L (ref 98–111)
Creatinine, Ser: 1.33 mg/dL — ABNORMAL HIGH (ref 0.61–1.24)
GFR, Estimated: 59 mL/min — ABNORMAL LOW (ref >60)
Glucose, Bld: 199 mg/dL — ABNORMAL HIGH (ref 70–99)
Potassium: 3.8 mmol/L (ref 3.5–5.1)
Sodium: 145 mmol/L (ref 135–145)

## 2021-07-07 NOTE — Progress Notes (Signed)
Pulmonary [place]   PULMONARY CRITICAL CARE SERVICE  PROGRESS NOTE     Tony Young  XKG:[id number]  DOB: 04-Feb-1956   DOA: 06/10/2021  Referring Physician: Satira Sark, MD  HPI: Tony Young is a 66 y.o. male being followed for ventilator/airway/oxygen weaning Acute on Chronic Respiratory Failure.  Patient is comfortable without distress has been afebrile capping on room air using the BiPAP at night  Medications: Reviewed on Rounds  Physical Exam:  Vitals: Temperature is 96.9 pulse 116 respiratory rate is 28 blood pressure is 107/61 saturations 98%  Ventilator Settings capping on room air  General: Comfortable at this time Neck: supple Cardiovascular: no malignant arrhythmias Respiratory: Scattered rhonchi expansion is equal Skin: no rash seen on limited exam Musculoskeletal: No gross abnormality Psychiatric:unable to assess Neurologic:no involuntary movements         Lab Data:   Basic Metabolic Panel: Recent Labs  Lab 06/30/21 1057 06/30/21 1057 07/01/21 0803 07/02/21 0335 07/03/21 0615 07/04/21 0442 07/05/21 0319 07/06/21 0743 07/07/21 0303  NA  --    < > 144 139 141 141 142 146* 145  K 3.4*  --  3.8 4.0 3.7 4.1 4.5 3.5 3.8  CL  --    < > 110 107 108 106 106 106 106  CO2  --    < > 23 23 22 24 25 26 26   GLUCOSE  --    < > 171* 153* 104* 170* 197* 94 199*  BUN  --    < > 76* 69* 64* 63* 64* 69* 76*  CREATININE  --    < > 1.40* 1.30* 1.40* 1.46* 1.46* 1.34* 1.33*  CALCIUM  --    < > 9.8 9.3 9.6 9.9 9.9 9.9 10.2  MG 2.1  --   --  2.2  --  2.2 2.2 2.2  --   PHOS  --   --  4.0 3.8  --  5.6* 4.7* 4.6  --    < > = values in this interval not displayed.    ABG: Recent Labs  Lab 07/03/21 0950  PHART 7.482*  PCO2ART 33.6  PO2ART 56.3*  HCO3 25.0  O2SAT 91.2    Liver Function Tests: Recent Labs  Lab 07/01/21 0803 07/02/21 0335 07/04/21 0442 07/05/21 0319 07/06/21 0743   ALBUMIN 2.6* 2.6* 2.5* 2.5* 2.6*   No results for input(s): LIPASE, AMYLASE in the last 168 hours. No results for input(s): AMMONIA in the last 168 hours.  CBC: Recent Labs  Lab 07/01/21 0803 07/02/21 0335 07/04/21 0442 07/05/21 0319 07/06/21 0743  WBC 8.8 8.6 7.1 6.4 6.5  HGB 8.8* 8.7* 8.7* 8.5* 8.9*  HCT 28.5* 28.0* 28.3* 27.1* 28.9*  MCV 91.9 90.3 92.8 90.0 92.0  PLT 104* 107* 72* 111* 118*    Cardiac Enzymes: No results for input(s): CKTOTAL, CKMB, CKMBINDEX, TROPONINI in the last 168 hours.  BNP (last 3 results) No results for input(s): BNP in the last 8760 hours.  ProBNP (last 3 results) No results for input(s): PROBNP in the last 8760 hours.  Radiological Exams: DG Chest Port 1 View  Result Date: 07/06/2021 CLINICAL DATA:  66 year old male with tracheostomy. Removed dialysis catheter. EXAM: PORTABLE CHEST 1 VIEW COMPARISON:  Portable chest 07/01/2021 and earlier. FINDINGS: Portable AP semi upright view at 0540 hours. Right chest dual lumen catheter removed. Tracheostomy remains in place, stable. Mildly lower lung volumes. Stable cardiac size and mediastinal contours. No pneumothorax, pleural effusion, consolidation, or  pleural effusion. Mild perihilar atelectasis suspected and stable. No new pulmonary opacity. No acute osseous abnormality identified. Paucity of bowel gas in the visible upper abdomen. IMPRESSION: 1. Right chest dialysis catheter removed. 2. No acute cardiopulmonary abnormality.  Stable mild atelectasis. Electronically Signed   By: Genevie Ann M.D.   On: 07/06/2021 06:08    Assessment/Plan Active Problems:   Acute on chronic respiratory failure with hypoxia (HCC)   Severe sepsis with septic shock (HCC)   Tracheostomy status (HCC)   Chronic heart failure with preserved ejection fraction (HCC)   AF (paroxysmal atrial fibrillation) (HCC)   Acute renal failure due to tubular necrosis (HCC)   Acute on chronic respiratory failure hypoxia he is doing really  well with the capping trials and using BiPAP at nighttime.  Family would like to take him home with a airway and therefore training sessions will begin Severe sepsis with shock supportive care resolved hemodynamics are stable Tracheostomy will remain in place for now family wants to take him home with a trach this may obviate the need for BiPAP at night for his sleep apnea Chronic heart failure preserved ejection fraction no change we will continue to follow along closely Atrial fibrillation rate is controlled we will continue with supportive care   I have personally seen and evaluated the patient, evaluated laboratory and imaging results, formulated the assessment and plan and placed orders. The Patient requires high complexity decision making with multiple systems involvement.  Rounds were done with the Respiratory Therapy Director and Staff therapists and discussed with nursing staff also.  [name], MD [facility] Pulmonary Critical Care Medicine Sleep Medicine

## 2021-07-08 DIAGNOSIS — I480 Paroxysmal atrial fibrillation: Secondary | ICD-10-CM | POA: Diagnosis not present

## 2021-07-08 DIAGNOSIS — I5032 Chronic diastolic (congestive) heart failure: Secondary | ICD-10-CM | POA: Diagnosis not present

## 2021-07-08 DIAGNOSIS — J9621 Acute and chronic respiratory failure with hypoxia: Secondary | ICD-10-CM | POA: Diagnosis not present

## 2021-07-08 DIAGNOSIS — N170 Acute kidney failure with tubular necrosis: Secondary | ICD-10-CM | POA: Diagnosis not present

## 2021-07-08 NOTE — Progress Notes (Signed)
Pulmonary [place]   PULMONARY CRITICAL CARE SERVICE  PROGRESS NOTE     Tony Young  [id number]:[id number]  DOB: Jan 18, 1956   DOA: 06/10/2021  Referring Physician: Satira Sark, MD  HPI: Tony Young is a 66 y.o. male being followed for ventilator/airway/oxygen weaning Acute on Chronic Respiratory Failure.  Patient is capping doing actually fairly well home training is supposed to be started so the patient can go home with the tracheostomy  Medications: Reviewed on Rounds  Physical Exam:  Vitals: Temperature is 97 pulse 102 respiratory is 18 blood pressure is 98/69 saturations 100%  Ventilator Settings capping on T-piece at nighttime  General: Comfortable at this time Neck: supple Cardiovascular: no malignant arrhythmias Respiratory: Scattered rhonchi expansion is equal at this time Skin: no rash seen on limited exam Musculoskeletal: No gross abnormality Psychiatric:unable to assess Neurologic:no involuntary movements         Lab Data:   Basic Metabolic Panel: Recent Labs  Lab 07/02/21 0335 07/03/21 0615 07/04/21 0442 07/05/21 0319 07/06/21 0743 07/07/21 0303  NA 139 141 141 142 146* 145  K 4.0 3.7 4.1 4.5 3.5 3.8  CL 107 108 106 106 106 106  CO2 23 22 24 25 26 26   GLUCOSE 153* 104* 170* 197* 94 199*  BUN 69* 64* 63* 64* 69* 76*  CREATININE 1.30* 1.40* 1.46* 1.46* 1.34* 1.33*  CALCIUM 9.3 9.6 9.9 9.9 9.9 10.2  MG 2.2  --  2.2 2.2 2.2  --   PHOS 3.8  --  5.6* 4.7* 4.6  --     ABG: Recent Labs  Lab 07/03/21 0950  PHART 7.482*  PCO2ART 33.6  PO2ART 56.3*  HCO3 25.0  O2SAT 91.2    Liver Function Tests: Recent Labs  Lab 07/02/21 0335 07/04/21 0442 07/05/21 0319 07/06/21 0743  ALBUMIN 2.6* 2.5* 2.5* 2.6*   No results for input(s): LIPASE, AMYLASE in the last 168 hours. No results for input(s): AMMONIA in the last 168 hours.  CBC: Recent Labs  Lab 07/02/21 0335 07/04/21 0442  07/05/21 0319 07/06/21 0743  WBC 8.6 7.1 6.4 6.5  HGB 8.7* 8.7* 8.5* 8.9*  HCT 28.0* 28.3* 27.1* 28.9*  MCV 90.3 92.8 90.0 92.0  PLT 107* 72* 111* 118*    Cardiac Enzymes: No results for input(s): CKTOTAL, CKMB, CKMBINDEX, TROPONINI in the last 168 hours.  BNP (last 3 results) No results for input(s): BNP in the last 8760 hours.  ProBNP (last 3 results) No results for input(s): PROBNP in the last 8760 hours.  Radiological Exams: No results found.  Assessment/Plan Active Problems:   Acute on chronic respiratory failure with hypoxia (HCC)   Severe sepsis with septic shock (HCC)   Tracheostomy status (HCC)   Chronic heart failure with preserved ejection fraction (HCC)   AF (paroxysmal atrial fibrillation) (HCC)   Acute renal failure due to tubular necrosis (HCC)   Acute on chronic respiratory failure hypoxia we will continue with capping during the daytime and then use the T-piece at nighttime. Severe sepsis resolved hemodynamics are stable Tracheostomy doing fine with capping Chronic heart failure preserved ejection fraction we will continue to follow along Atrial fibrillation rate is controlled at this time   I have personally seen and evaluated the patient, evaluated laboratory and imaging results, formulated the assessment and plan and placed orders. The Patient requires high complexity decision making with multiple systems involvement.  Rounds were done with the Respiratory Therapy Director and Staff therapists and discussed with  nursing staff also.  Tony Gee, MD [facility] Pulmonary Critical Care Medicine Sleep Medicine

## 2021-07-09 ENCOUNTER — Other Ambulatory Visit (HOSPITAL_COMMUNITY): Payer: Medicare Other

## 2021-07-09 DIAGNOSIS — J9621 Acute and chronic respiratory failure with hypoxia: Secondary | ICD-10-CM | POA: Diagnosis not present

## 2021-07-09 DIAGNOSIS — N170 Acute kidney failure with tubular necrosis: Secondary | ICD-10-CM | POA: Diagnosis not present

## 2021-07-09 DIAGNOSIS — I480 Paroxysmal atrial fibrillation: Secondary | ICD-10-CM | POA: Diagnosis not present

## 2021-07-09 DIAGNOSIS — I5032 Chronic diastolic (congestive) heart failure: Secondary | ICD-10-CM | POA: Diagnosis not present

## 2021-07-09 LAB — RENAL FUNCTION PANEL
Albumin: 2.6 g/dL — ABNORMAL LOW (ref 3.5–5.0)
Anion gap: 12 (ref 5–15)
BUN: 62 mg/dL — ABNORMAL HIGH (ref 8–23)
CO2: 26 mmol/L (ref 22–32)
Calcium: 10.5 mg/dL — ABNORMAL HIGH (ref 8.9–10.3)
Chloride: 106 mmol/L (ref 98–111)
Creatinine, Ser: 1.25 mg/dL — ABNORMAL HIGH (ref 0.61–1.24)
GFR, Estimated: >60 mL/min (ref >60)
Glucose, Bld: 167 mg/dL — ABNORMAL HIGH (ref 70–99)
Phosphorus: 4.5 mg/dL (ref 2.5–4.6)
Potassium: 3.1 mmol/L — ABNORMAL LOW (ref 3.5–5.1)
Sodium: 144 mmol/L (ref 135–145)

## 2021-07-09 LAB — CBC
HCT: 31.2 % — ABNORMAL LOW (ref 39.0–52.0)
Hemoglobin: 9.7 g/dL — ABNORMAL LOW (ref 13.0–17.0)
MCH: 28.4 pg (ref 26.0–34.0)
MCHC: 31.1 g/dL (ref 30.0–36.0)
MCV: 91.5 fL (ref 80.0–100.0)
Platelets: 114 10*3/uL — ABNORMAL LOW (ref 150–400)
RBC: 3.41 MIL/uL — ABNORMAL LOW (ref 4.22–5.81)
RDW: 15.5 % (ref 11.5–15.5)
WBC: 4.4 10*3/uL (ref 4.0–10.5)
nRBC: 0.5 % — ABNORMAL HIGH (ref 0.0–0.2)

## 2021-07-09 LAB — MAGNESIUM: Magnesium: 2 mg/dL (ref 1.7–2.4)

## 2021-07-09 NOTE — Progress Notes (Signed)
Pulmonary [place]   PULMONARY CRITICAL CARE SERVICE  PROGRESS NOTE     Chez Bulnes  [id number]:[id number]  DOB: [birth year]   DOA: 06/10/2021  Referring Physician: Satira Sark, MD  HPI: Case Young is a 66 y.o. male being followed for ventilator/airway/oxygen weaning Acute on Chronic Respiratory Failure.  Patient is afebrile resting comfortably has been capping on room air using the T collar at nighttime  Medications: Reviewed on Rounds  Physical Exam:  Vitals: Temperature is 97.3 pulse 112 respiratory is 18 blood pressure 102/50 saturations 99%  Ventilator Settings patient will be going on T collar at nighttime and capping during the daytime.  General: Comfortable at this time Neck: supple Cardiovascular: no malignant arrhythmias Respiratory: Scattered rhonchi expansion is equal Skin: no rash seen on limited exam Musculoskeletal: No gross abnormality Psychiatric:unable to assess Neurologic:no involuntary movements         Lab Data:   Basic Metabolic Panel: Recent Labs  Lab 07/04/21 0442 07/05/21 0319 07/06/21 0743 07/07/21 0303 07/09/21 0346  NA 141 142 146* 145 144  K 4.1 4.5 3.5 3.8 3.1*  CL 106 106 106 106 106  CO2 24 25 26 26 26   GLUCOSE 170* 197* 94 199* 167*  BUN 63* 64* 69* 76* 62*  CREATININE 1.46* 1.46* 1.34* 1.33* 1.25*  CALCIUM 9.9 9.9 9.9 10.2 10.5*  MG 2.2 2.2 2.2  --  2.0  PHOS 5.6* 4.7* 4.6  --  4.5    ABG: Recent Labs  Lab 07/03/21 0950  PHART 7.482*  PCO2ART 33.6  PO2ART 56.3*  HCO3 25.0  O2SAT 91.2    Liver Function Tests: Recent Labs  Lab 07/04/21 0442 07/05/21 0319 07/06/21 0743 07/09/21 0346  ALBUMIN 2.5* 2.5* 2.6* 2.6*   No results for input(s): LIPASE, AMYLASE in the last 168 hours. No results for input(s): AMMONIA in the last 168 hours.  CBC: Recent Labs  Lab 07/04/21 0442 07/05/21 0319 07/06/21 0743 07/09/21 0346  WBC 7.1 6.4 6.5 4.4  HGB  8.7* 8.5* 8.9* 9.7*  HCT 28.3* 27.1* 28.9* 31.2*  MCV 92.8 90.0 92.0 91.5  PLT 72* 111* 118* 114*    Cardiac Enzymes: No results for input(s): CKTOTAL, CKMB, CKMBINDEX, TROPONINI in the last 168 hours.  BNP (last 3 results) No results for input(s): BNP in the last 8760 hours.  ProBNP (last 3 results) No results for input(s): PROBNP in the last 8760 hours.  Radiological Exams: No results found.  Assessment/Plan Active Problems:   Acute on chronic respiratory failure with hypoxia (HCC)   Severe sepsis with septic shock (HCC)   Tracheostomy status (HCC)   Chronic heart failure with preserved ejection fraction (HCC)   AF (paroxysmal atrial fibrillation) (HCC)   Acute renal failure due to tubular necrosis ([place])   Acute on chronic respiratory failure with hypoxia patient is supposed to undergo home training for T collar so that patient can go home.  Family is still awaited to start Severe sepsis with shock resolved hemodynamics are stable we will continue to monitor closely. Chronic heart failure preserved ejection fraction no change we will continue with supportive care Atrial fibrillation rate is controlled Tracheostomy will continue with home training to be started by RT spoke with the family   I have personally seen and evaluated the patient, evaluated laboratory and imaging results, formulated the assessment and plan and placed orders. The Patient requires high complexity decision making with multiple systems involvement.  Rounds were done with the  Respiratory Therapy Director and Staff therapists and discussed with nursing staff also.  [name], MD [facility] Pulmonary Critical Care Medicine Sleep Medicine

## 2021-07-10 DIAGNOSIS — I5032 Chronic diastolic (congestive) heart failure: Secondary | ICD-10-CM | POA: Diagnosis not present

## 2021-07-10 DIAGNOSIS — J9621 Acute and chronic respiratory failure with hypoxia: Secondary | ICD-10-CM | POA: Diagnosis not present

## 2021-07-10 DIAGNOSIS — N170 Acute kidney failure with tubular necrosis: Secondary | ICD-10-CM | POA: Diagnosis not present

## 2021-07-10 DIAGNOSIS — I480 Paroxysmal atrial fibrillation: Secondary | ICD-10-CM | POA: Diagnosis not present

## 2021-07-10 LAB — POTASSIUM: Potassium: 3.6 mmol/L (ref 3.5–5.1)

## 2021-07-10 NOTE — Progress Notes (Signed)
Pulmonary [place]   PULMONARY CRITICAL CARE SERVICE  PROGRESS NOTE     Tony Young  [id number]  DOB: 03/02/1956   DOA: 06/10/2021  Referring Physician: Satira Sark, MD  HPI: Tony Young is a 66 y.o. male being followed for ventilator/airway/oxygen weaning Acute on Chronic Respiratory Failure.  Patient is capping and has been on room air  Medications: Reviewed on Rounds  Physical Exam:  Vitals: Temperature is 97.8 pulse 107 respiratory 17 blood pressure is 137/60 saturations 94%  Ventilator Settings capping on room air  General: Comfortable at this time Neck: supple Cardiovascular: no malignant arrhythmias Respiratory: Scattered rhonchi expansion is equal Skin: no rash seen on limited exam Musculoskeletal: No gross abnormality Psychiatric:unable to assess Neurologic:no involuntary movements         Lab Data:   Basic Metabolic Panel: Recent Labs  Lab 07/04/21 0442 07/05/21 0319 07/06/21 0743 07/07/21 0303 07/09/21 0346 07/10/21 0453  NA 141 142 146* 145 144  --   K 4.1 4.5 3.5 3.8 3.1* 3.6  CL 106 106 106 106 106  --   CO2 24 25 26 26 26   --   GLUCOSE 170* 197* 94 199* 167*  --   BUN 63* 64* 69* 76* 62*  --   CREATININE 1.46* 1.46* 1.34* 1.33* 1.25*  --   CALCIUM 9.9 9.9 9.9 10.2 10.5*  --   MG 2.2 2.2 2.2  --  2.0  --   PHOS 5.6* 4.7* 4.6  --  4.5  --     ABG: Recent Labs  Lab 07/03/21 0950  PHART 7.482*  PCO2ART 33.6  PO2ART 56.3*  HCO3 25.0  O2SAT 91.2    Liver Function Tests: Recent Labs  Lab 07/04/21 0442 07/05/21 0319 07/06/21 0743 07/09/21 0346  ALBUMIN 2.5* 2.5* 2.6* 2.6*   No results for input(s): LIPASE, AMYLASE in the last 168 hours. No results for input(s): AMMONIA in the last 168 hours.  CBC: Recent Labs  Lab 07/04/21 0442 07/05/21 0319 07/06/21 0743 07/09/21 0346  WBC 7.1 6.4 6.5 4.4  HGB 8.7* 8.5* 8.9* 9.7*  HCT 28.3* 27.1* 28.9* 31.2*   MCV 92.8 90.0 92.0 91.5  PLT 72* 111* 118* 114*    Cardiac Enzymes: No results for input(s): CKTOTAL, CKMB, CKMBINDEX, TROPONINI in the last 168 hours.  BNP (last 3 results) No results for input(s): BNP in the last 8760 hours.  ProBNP (last 3 results) No results for input(s): PROBNP in the last 8760 hours.  Radiological Exams: No results found.  Assessment/Plan Active Problems:   Acute on chronic respiratory failure with hypoxia (HCC)   Severe sepsis with septic shock (HCC)   Tracheostomy status (HCC)   Chronic heart failure with preserved ejection fraction (HCC)   AF (paroxysmal atrial fibrillation) (HCC)   Acute renal failure due to tubular necrosis (HCC)   Acute on chronic respiratory failure hypoxia capping during the daytime and going on T collar at nighttime.  Still awaiting family for home training Severe sepsis with shock resolved hemodynamics are stable Chronic heart failure preserved ejection fraction no change we will continue to monitor closely. Atrial fibrillation rate is controlled Acute renal failure supportive care monitor patient's lab work   I have personally seen and evaluated the patient, evaluated laboratory and imaging results, formulated the assessment and plan and placed orders. The Patient requires high complexity decision making with multiple systems involvement.  Rounds were done with the Respiratory Therapy Director and Staff therapists  and discussed with nursing staff also.  [name], MD [facility] Pulmonary Critical Care Medicine Sleep Medicine

## 2021-07-11 DIAGNOSIS — J9621 Acute and chronic respiratory failure with hypoxia: Secondary | ICD-10-CM | POA: Diagnosis not present

## 2021-07-11 DIAGNOSIS — I480 Paroxysmal atrial fibrillation: Secondary | ICD-10-CM | POA: Diagnosis not present

## 2021-07-11 DIAGNOSIS — I5032 Chronic diastolic (congestive) heart failure: Secondary | ICD-10-CM | POA: Diagnosis not present

## 2021-07-11 DIAGNOSIS — N170 Acute kidney failure with tubular necrosis: Secondary | ICD-10-CM | POA: Diagnosis not present

## 2021-07-11 NOTE — Progress Notes (Signed)
Pulmonary [place]   PULMONARY CRITICAL CARE SERVICE  PROGRESS NOTE     Tony Young  [id number]  DOB: May 02, 1956   DOA: 06/10/2021  Referring Physician: Satira Sark, MD  HPI: Tony Young is a 66 y.o. male being followed for ventilator/airway/oxygen weaning Acute on Chronic Respiratory Failure.  Afebrile without distress patient has been off the ventilator and has been doing fine with capping  Medications: Reviewed on Rounds  Physical Exam:  Vitals: Temperature is 97.1 pulse 117 respiratory 16 blood pressure is 93/54 saturations 100%  Ventilator Settings capping on room air  General: Comfortable at this time Neck: supple Cardiovascular: no malignant arrhythmias Respiratory: Scattered coarse rhonchi Skin: no rash seen on limited exam Musculoskeletal: No gross abnormality Psychiatric:unable to assess Neurologic:no involuntary movements         Lab Data:   Basic Metabolic Panel: Recent Labs  Lab 07/05/21 0319 07/06/21 0743 07/07/21 0303 07/09/21 0346 07/10/21 0453  NA 142 146* 145 144  --   K 4.5 3.5 3.8 3.1* 3.6  CL 106 106 106 106  --   CO2 25 26 26 26   --   GLUCOSE 197* 94 199* 167*  --   BUN 64* 69* 76* 62*  --   CREATININE 1.46* 1.34* 1.33* 1.25*  --   CALCIUM 9.9 9.9 10.2 10.5*  --   MG 2.2 2.2  --  2.0  --   PHOS 4.7* 4.6  --  4.5  --     ABG: No results for input(s): PHART, PCO2ART, PO2ART, HCO3, O2SAT in the last 168 hours.  Liver Function Tests: Recent Labs  Lab 07/05/21 0319 07/06/21 0743 07/09/21 0346  ALBUMIN 2.5* 2.6* 2.6*   No results for input(s): LIPASE, AMYLASE in the last 168 hours. No results for input(s): AMMONIA in the last 168 hours.  CBC: Recent Labs  Lab 07/05/21 0319 07/06/21 0743 07/09/21 0346  WBC 6.4 6.5 4.4  HGB 8.5* 8.9* 9.7*  HCT 27.1* 28.9* 31.2*  MCV 90.0 92.0 91.5  PLT 111* 118* 114*    Cardiac Enzymes: No results for input(s):  CKTOTAL, CKMB, CKMBINDEX, TROPONINI in the last 168 hours.  BNP (last 3 results) No results for input(s): BNP in the last 8760 hours.  ProBNP (last 3 results) No results for input(s): PROBNP in the last 8760 hours.  Radiological Exams: No results found.  Assessment/Plan Active Problems:   Acute on chronic respiratory failure with hypoxia (HCC)   Severe sepsis with septic shock (HCC)   Tracheostomy status (HCC)   Chronic heart failure with preserved ejection fraction (HCC)   AF (paroxysmal atrial fibrillation) (HCC)   Acute renal failure due to tubular necrosis (HCC)   Acute on chronic respiratory failure hypoxia patient is actually done well with capping trials plan is going to be to continue to monitor closely.  Patient is on capping at nighttime on T-piece Severe sepsis with shock hemodynamics are stable we will continue to follow along closely. Chronic heart failure preserved ejection fraction supportive care Atrial fibrillation rate is controlled at this time Acute renal failure supportive care   I have personally seen and evaluated the patient, evaluated laboratory and imaging results, formulated the assessment and plan and placed orders. The Patient requires high complexity decision making with multiple systems involvement.  Rounds were done with the Respiratory Therapy Director and Staff therapists and discussed with nursing staff also.  [name], MD [facility] Pulmonary Critical Care Medicine Sleep Medicine

## 2021-07-12 LAB — RENAL FUNCTION PANEL
Albumin: 2.6 g/dL — ABNORMAL LOW (ref 3.5–5.0)
Anion gap: 11 (ref 5–15)
BUN: 49 mg/dL — ABNORMAL HIGH (ref 8–23)
CO2: 25 mmol/L (ref 22–32)
Calcium: 10.1 mg/dL (ref 8.9–10.3)
Chloride: 106 mmol/L (ref 98–111)
Creatinine, Ser: 1.2 mg/dL (ref 0.61–1.24)
GFR, Estimated: >60 mL/min (ref >60)
Glucose, Bld: 76 mg/dL (ref 70–99)
Phosphorus: 4.1 mg/dL (ref 2.5–4.6)
Potassium: 3.6 mmol/L (ref 3.5–5.1)
Sodium: 142 mmol/L (ref 135–145)

## 2021-07-12 LAB — CBC
HCT: 29.1 % — ABNORMAL LOW (ref 39.0–52.0)
Hemoglobin: 9 g/dL — ABNORMAL LOW (ref 13.0–17.0)
MCH: 28.2 pg (ref 26.0–34.0)
MCHC: 30.9 g/dL (ref 30.0–36.0)
MCV: 91.2 fL (ref 80.0–100.0)
Platelets: 148 10*3/uL — ABNORMAL LOW (ref 150–400)
RBC: 3.19 MIL/uL — ABNORMAL LOW (ref 4.22–5.81)
RDW: 15.9 % — ABNORMAL HIGH (ref 11.5–15.5)
WBC: 3.4 10*3/uL — ABNORMAL LOW (ref 4.0–10.5)
nRBC: 1.2 % — ABNORMAL HIGH (ref 0.0–0.2)

## 2021-07-12 LAB — MAGNESIUM: Magnesium: 2.1 mg/dL (ref 1.7–2.4)

## 2021-07-13 DIAGNOSIS — I480 Paroxysmal atrial fibrillation: Secondary | ICD-10-CM | POA: Diagnosis not present

## 2021-07-13 DIAGNOSIS — I5032 Chronic diastolic (congestive) heart failure: Secondary | ICD-10-CM | POA: Diagnosis not present

## 2021-07-13 DIAGNOSIS — J9621 Acute and chronic respiratory failure with hypoxia: Secondary | ICD-10-CM | POA: Diagnosis not present

## 2021-07-13 DIAGNOSIS — N170 Acute kidney failure with tubular necrosis: Secondary | ICD-10-CM | POA: Diagnosis not present

## 2021-07-13 NOTE — Progress Notes (Signed)
Pulmonary [place]   PULMONARY CRITICAL CARE SERVICE  PROGRESS NOTE     Tony Young  [id number]  DOB: May 07, 1956   DOA: 06/10/2021  Referring Physician: Satira Sark, MD  HPI: Tony Young is a 66 y.o. male being followed for ventilator/airway/oxygen weaning Acute on Chronic Respiratory Failure.  Patient is capping has been on room air no acute distress is noted appears to be resting comfortably without distress at this time  Medications: Reviewed on Rounds  Physical Exam:  Vitals: Temperature is 98.1 pulse 100 respiratory 15 blood pressure 123/67 saturations 99%  Ventilator Settings capping on room air  General: Comfortable at this time Neck: supple Cardiovascular: no malignant arrhythmias Respiratory: No rhonchi very coarse breath sounds Skin: no rash seen on limited exam Musculoskeletal: No gross abnormality Psychiatric:unable to assess Neurologic:no involuntary movements         Lab Data:   Basic Metabolic Panel: Recent Labs  Lab 07/07/21 0303 07/09/21 0346 07/10/21 0453 07/12/21 0310  NA 145 144  --  142  K 3.8 3.1* 3.6 3.6  CL 106 106  --  106  CO2 26 26  --  25  GLUCOSE 199* 167*  --  76  BUN 76* 62*  --  49*  CREATININE 1.33* 1.25*  --  1.20  CALCIUM 10.2 10.5*  --  10.1  MG  --  2.0  --  2.1  PHOS  --  4.5  --  4.1    ABG: No results for input(s): PHART, PCO2ART, PO2ART, HCO3, O2SAT in the last 168 hours.  Liver Function Tests: Recent Labs  Lab 07/09/21 0346 07/12/21 0310  ALBUMIN 2.6* 2.6*   No results for input(s): LIPASE, AMYLASE in the last 168 hours. No results for input(s): AMMONIA in the last 168 hours.  CBC: Recent Labs  Lab 07/09/21 0346 07/12/21 0310  WBC 4.4 3.4*  HGB 9.7* 9.0*  HCT 31.2* 29.1*  MCV 91.5 91.2  PLT 114* 148*    Cardiac Enzymes: No results for input(s): CKTOTAL, CKMB, CKMBINDEX, TROPONINI in the last 168 hours.  BNP (last 3  results) No results for input(s): BNP in the last 8760 hours.  ProBNP (last 3 results) No results for input(s): PROBNP in the last 8760 hours.  Radiological Exams: No results found.  Assessment/Plan Active Problems:   Acute on chronic respiratory failure with hypoxia (HCC)   Severe sepsis with septic shock (HCC)   Tracheostomy status (HCC)   Chronic heart failure with preserved ejection fraction (HCC)   AF (paroxysmal atrial fibrillation) (HCC)   Acute renal failure due to tubular necrosis (HCC)   Acute on chronic respiratory failure hypoxia plan is going to be to continue with capping trials patient currently is on 28% FiO2 Severe sepsis with shock hemodynamics are stable Chronic heart failure preserved ejection fraction no change we will continue to follow along closely Atrial fibrillation rate is controlled Acute tubular necrosis improving following lab   I have personally seen and evaluated the patient, evaluated laboratory and imaging results, formulated the assessment and plan and placed orders. The Patient requires high complexity decision making with multiple systems involvement.  Rounds were done with the Respiratory Therapy Director and Staff therapists and discussed with nursing staff also.  [name], MD [facility] Pulmonary Critical Care Medicine Sleep Medicine

## 2021-07-14 ENCOUNTER — Other Ambulatory Visit (HOSPITAL_COMMUNITY): Payer: Medicare Other

## 2021-07-14 DIAGNOSIS — I5032 Chronic diastolic (congestive) heart failure: Secondary | ICD-10-CM | POA: Diagnosis not present

## 2021-07-14 DIAGNOSIS — J9621 Acute and chronic respiratory failure with hypoxia: Secondary | ICD-10-CM | POA: Diagnosis not present

## 2021-07-14 DIAGNOSIS — I480 Paroxysmal atrial fibrillation: Secondary | ICD-10-CM | POA: Diagnosis not present

## 2021-07-14 DIAGNOSIS — N170 Acute kidney failure with tubular necrosis: Secondary | ICD-10-CM | POA: Diagnosis not present

## 2021-07-14 LAB — CBC
HCT: 29.9 % — ABNORMAL LOW (ref 39.0–52.0)
Hemoglobin: 9.5 g/dL — ABNORMAL LOW (ref 13.0–17.0)
MCH: 28.7 pg (ref 26.0–34.0)
MCHC: 31.8 g/dL (ref 30.0–36.0)
MCV: 90.3 fL (ref 80.0–100.0)
Platelets: 162 10*3/uL (ref 150–400)
RBC: 3.31 MIL/uL — ABNORMAL LOW (ref 4.22–5.81)
RDW: 15.9 % — ABNORMAL HIGH (ref 11.5–15.5)
WBC: 3.9 10*3/uL — ABNORMAL LOW (ref 4.0–10.5)
nRBC: 0.8 % — ABNORMAL HIGH (ref 0.0–0.2)

## 2021-07-14 LAB — RENAL FUNCTION PANEL
Albumin: 2.5 g/dL — ABNORMAL LOW (ref 3.5–5.0)
Anion gap: 12 (ref 5–15)
BUN: 44 mg/dL — ABNORMAL HIGH (ref 8–23)
CO2: 26 mmol/L (ref 22–32)
Calcium: 10.1 mg/dL (ref 8.9–10.3)
Chloride: 103 mmol/L (ref 98–111)
Creatinine, Ser: 1.22 mg/dL (ref 0.61–1.24)
GFR, Estimated: >60 mL/min (ref >60)
Glucose, Bld: 170 mg/dL — ABNORMAL HIGH (ref 70–99)
Phosphorus: 3.8 mg/dL (ref 2.5–4.6)
Potassium: 3.2 mmol/L — ABNORMAL LOW (ref 3.5–5.1)
Sodium: 141 mmol/L (ref 135–145)

## 2021-07-14 LAB — MAGNESIUM: Magnesium: 2 mg/dL (ref 1.7–2.4)

## 2021-07-14 NOTE — Progress Notes (Signed)
Pulmonary [place]   PULMONARY CRITICAL CARE SERVICE  PROGRESS NOTE     Tony Young  SWN:[id number]  DOB: 1955/07/08   DOA: 06/10/2021  Referring Physician: Satira Sark, MD  HPI: Tony Young is a 66 y.o. male being followed for ventilator/airway/oxygen weaning Acute on Chronic Respiratory Failure.  Patient is capping will be completing 24 hours and looks good  Medications: Reviewed on Rounds  Physical Exam:  Vitals: Temperature is 99 pulse 103 respiratory 25 blood pressures 131/63 saturations 95%  Ventilator Settings capping off the ventilator  General: Comfortable at this time Neck: supple Cardiovascular: no malignant arrhythmias Respiratory: No rhonchi very coarse breath sounds Skin: no rash seen on limited exam Musculoskeletal: No gross abnormality Psychiatric:unable to assess Neurologic:no involuntary movements         Lab Data:   Basic Metabolic Panel: Recent Labs  Lab 07/09/21 0346 07/10/21 0453 07/12/21 0310 07/14/21 0345  NA 144  --  142 141  K 3.1* 3.6 3.6 3.2*  CL 106  --  106 103  CO2 26  --  25 26  GLUCOSE 167*  --  76 170*  BUN 62*  --  49* 44*  CREATININE 1.25*  --  1.20 1.22  CALCIUM 10.5*  --  10.1 10.1  MG 2.0  --  2.1 2.0  PHOS 4.5  --  4.1 3.8    ABG: No results for input(s): PHART, PCO2ART, PO2ART, HCO3, O2SAT in the last 168 hours.  Liver Function Tests: Recent Labs  Lab 07/09/21 0346 07/12/21 0310 07/14/21 0345  ALBUMIN 2.6* 2.6* 2.5*   No results for input(s): LIPASE, AMYLASE in the last 168 hours. No results for input(s): AMMONIA in the last 168 hours.  CBC: Recent Labs  Lab 07/09/21 0346 07/12/21 0310 07/14/21 0345  WBC 4.4 3.4* 3.9*  HGB 9.7* 9.0* 9.5*  HCT 31.2* 29.1* 29.9*  MCV 91.5 91.2 90.3  PLT 114* 148* 162    Cardiac Enzymes: No results for input(s): CKTOTAL, CKMB, CKMBINDEX, TROPONINI in the last 168 hours.  BNP (last 3  results) No results for input(s): BNP in the last 8760 hours.  ProBNP (last 3 results) No results for input(s): PROBNP in the last 8760 hours.  Radiological Exams: No results found.  Assessment/Plan Active Problems:   Acute on chronic respiratory failure with hypoxia (HCC)   Severe sepsis with septic shock (HCC)   Tracheostomy status (HCC)   Chronic heart failure with preserved ejection fraction (HCC)   AF (paroxysmal atrial fibrillation) (HCC)   Acute renal failure due to tubular necrosis (HCC)   Acute on chronic respiratory failure hypoxia plan is to continue with capping trials as tolerated continue secretion management supportive care Severe sepsis treated resolving hemodynamics are stable Chronic heart failure preserved ejection fraction patient at baseline Atrial fibrillation rate is controlled at this time Acute renal failure with tubular necrosis at baseline   I have personally seen and evaluated the patient, evaluated laboratory and imaging results, formulated the assessment and plan and placed orders. The Patient requires high complexity decision making with multiple systems involvement.  Rounds were done with the Respiratory Therapy Director and Staff therapists and discussed with nursing staff also.  [name], MD [facility] Pulmonary Critical Care Medicine Sleep Medicine

## 2021-07-14 NOTE — PMR Pre-admission (Signed)
PMR Admission Coordinator Pre-Admission Assessment  Patient: Tony Young is an 66 y.o., male MRN: [id number] DOB: 1956/04/06 Height:   Weight: 112.9 kg  Insurance Information HMO:     PPO:      PCP:      IPA:      80/20: yes     OTHER:  PRIMARY: Medicare A and B      Policy#: [id number]      Subscriber: Pt  Phone#: Verified online    Fax#:  Pre-Cert#:       Employer:  Benefits:  Phone #:      Name:  Eff. Date: Parts A ad B effective 07/02/2019  Deduct: $1600      Out of Pocket Max:  None      Life Max: N/A  CIR: 100%      SNF: 100 days Outpatient: 80%     Co-Pay: 20% Home Health: 100%      Co-Pay: none DME: 80%     Co-Pay: 20% Providers: patient's choice SECONDARY: [employer]       Policy#: [id number]      Phone#:   Financial Counselor:       Phone#:   The Data Collection Information Summary for patients in Inpatient Rehabilitation Facilities with attached Privacy Act [place] Records was provided and verbally reviewed with: Family  Emergency Contact Information Contact Information     Name Relation Home Work Mobile   [place] Daughter   [phone]   Chau, Sawin   [phone]       Current Medical History  Patient Admitting Diagnosis: Respiratory Failure  History of Present Illness: Tony Young is an 66 y.o. male with medical history significant for COPD, congestive heart failure, diabetes mellitus, coronary artery disease who was admitted to the acute facility on 05/13/2021 with complaints of worsening shortness of breath, fever, found to have necrotizing fasciitis of the left lower extremity.  He was started on broad-spectrum antimicrobials. He underwent multiple debridements.  Hospital course was complicated by acute kidney injury needing dialysis, respiratory failure, sepsis with shock, GI bleed, bacteremia.  He was seen by GI, wound care, cardiology, general surgery, nephrology, infectious disease.  He was initially  treated with IV vancomycin.  However, due to the renal failure vancomycin was discontinued and he was started on doxycycline.  He also had respiratory failure due to sepsis and shock and was placed on mechanical ventilation and ended up with a tracheostomy.  He was eventually stabilized and transferred to [facility] on 06/10/2021. Pt. Has experienced renal recovery and no longer requires HD. He has also been weaned from the vent and is tolerating capping trials.     Patient's medical record from [facility] has been reviewed by the rehabilitation admission coordinator and physician.  Past Medical History  No past medical history on file.  Has the patient had major surgery during 100 days prior to admission? Yes  Family History   family history is not on file.  Current Medications No current facility-administered medications for this encounter.  Patients Current Diet: Diet Dysphagia 1, Thin liquid   Precautions / Restrictions Precautions: Fall Precautions/Special Needs: Swallowing Weight Bearing Restrictions: No    Has the patient had 2 or more falls or a fall with injury in the past year? No  Prior Activity Level Community (5-7x/wk): Pt. was active in the community PTA   Prior Functional Level Self Care: Did the patient need help bathing, dressing, using the toilet  or eating? Independent  Indoor Mobility: Did the patient need assistance with walking from room to room (with or without device)? Independent  Stairs: Did the patient need assistance with internal or external stairs (with or without device)? Independent  Functional Cognition: Did the patient need help planning regular tasks such as shopping or remembering to take medications? Independent  Patient Information Are you of Hispanic, Latino/a,or Spanish origin?: A. No, not of Hispanic, Latino/a, or Spanish origin What is your race?: A. White Do you need or want an interpreter to  communicate with a doctor or health care staff?: 0. No  Patient's Response To:  Health Literacy and Transportation Is the patient able to respond to health literacy and transportation needs?: Yes Health Literacy - How often do you need to have someone help you when you read instructions, pamphlets, or other written material from your doctor or pharmacy?: Always In the past 12 months, has lack of transportation kept you from medical appointments or from getting medications?: No In the past 12 months, has lack of transportation kept you from meetings, work, or from getting things needed for daily living?: No  Home Assistive Devices / Equipment BIPAP   Prior Device Use: Indicate devices/aids used by the patient prior to current illness, exacerbation or injury? None of the above   Prior Functional Level Current Functional Level  Bed Mobility  Independent    Max A   Transfers    Independent    Not attempted  Mobility - Walk/Wheelchair    Independent    Not attempted  Upper Body Dressing    Independent    Mod A  Lower Body Dressing    Independent    Total A  Grooming    Independent    Mod A  Eating/Drinking    Independent    Max A  Toilet Transfer    Independent    Not attempted  Bladder Continence     Continent of bladder   Incontinent of bladder  Bowel Management    Continent of bowel   Incontinent of bowel  Stair Climbing  Not attempted    Not attempted  Communication  Independent    Mod A   Memory    Independent    Mod A    Special Needs/ Care Considerations Special Bed bariatric bed , Skin LLE wound, Special service needs n/a, Bowel management incontinent, and Bladder management incontinent  Previous Home Environment (from acute therapy documentation) Living Arrangements: Children  Lives With: Family Available Help at Discharge: Family; Available 24 hours/day Type of Home: House Home Layout: One level Home Access: Ramped entrance Bathroom  Shower/Tub: Tub/shower unit Bathroom Toilet: Handicapped height Bathroom Accessibility: Yes How Accessible: Accessible via walker; Accessible via wheelchair Home Care Services: No   Discharge Living Setting Plans for Discharge Living Setting: Patient's home; House Type of Home at Discharge: House Discharge Home Layout: One level Discharge Home Access: [place] entrance Discharge Bathroom Shower/Tub: [place] unit Discharge Bathroom Toilet: Handicapped height Discharge Bathroom Accessibility: No Does the patient have any problems obtaining your medications?: No   Social/Family/Support Systems Patient Roles: Other (Comment) Contact Information: [phone] Anticipated Caregiver: Corey Harold (Pt.'s son and son and law will also assist) Anticipated Caregiver's Contact Information: [phone] Ability/Limitations of Caregiver: Can provide Mod A Caregiver Availability: 24/7 Discharge Plan Discussed with Primary Caregiver: Yes Is Caregiver In Agreement with Plan?: Yes Does Caregiver/Family have Issues with Lodging/Transportation while Pt is in Rehab?: Yes   Goals Patient/Family Goal for Rehab: PT/OT Mod  A, SLP Supervision Expected length of stay: 16-18 days Pt/Family Agrees to Admission and willing to participate: Yes Program Orientation Provided & Reviewed with Pt/Caregiver Including Roles  & Responsibilities: Yes   Decrease burden of Care through IP rehab admission: Specialzed equipment needs, Decannulation, Diet advancement, Decrease number of caregivers, Bowel and bladder program, and Patient/family education  Possible need for SNF placement upon discharge: not anticipated  Patient Condition: I have reviewed medical records from [facility] , spoken with CM, and patient and daughter. I met with patient at the bedside for inpatient rehabilitation assessment.  Patient will benefit from ongoing PT and OT, can actively participate in 3 hours of therapy a day 5  days of the week, and can make measurable gains during the admission.  Patient will also benefit from the coordinated team approach during an Inpatient Acute Rehabilitation admission.  The patient will receive intensive therapy as well as Rehabilitation physician, nursing, social worker, and care management interventions.  Due to bladder management, bowel management, safety, skin/wound care, disease management, medication administration, pain management, and patient education the patient requires 24 hour a day rehabilitation nursing.  The patient is currently max-total A with mobility and basic ADLs.  Discharge setting and therapy post discharge at home with home health is anticipated.  Patient has agreed to participate in the Acute Inpatient Rehabilitation Program and will admit today.  Preadmission Screen Completed By:  Genella Mech, 07/14/2021 2:46 PM ______________________________________________________________________   Discussed status with Dr. Ranell Patrick  on 07/15/21 at 11 and received approval for admission today.  Admission Coordinator:  Genella Mech, CCC-SLP, time 1030/Date 07/15/21   Assessment/Plan: Diagnosis: Debility Does the need for close, 24 hr/day Medical supervision in concert with the patient's rehab needs make it unreasonable for this patient to be served in a less intensive setting? Yes Co-Morbidities requiring supervision/potential complications: acute on chronic respiratory failure with hypoxia, severe sepsis with septic shock, chronic heart failure, atrial fibrillation, acute renal failure due to tubular necrosis.  Due to bladder management, bowel management, safety, skin/wound care, disease management, medication administration, pain management, and patient education, does the patient require 24 hr/day rehab nursing? Yes Does the patient require coordinated care of a physician, rehab nurse, PT, OT, and SLP to address physical and functional deficits in the context of the above  medical diagnosis(es)? Yes Addressing deficits in the following areas: balance, endurance, locomotion, strength, transferring, bowel/bladder control, bathing, dressing, feeding, grooming, toileting, and psychosocial support Can the patient actively participate in an intensive therapy program of at least 3 hrs of therapy 5 days a week? Yes The potential for patient to make measurable gains while on inpatient rehab is good Anticipated functional outcomes upon discharge from inpatient rehab: mod assist PT, mod assist OT, independent SLP Estimated rehab length of stay to reach the above functional goals is: 14 days Anticipated discharge destination: Home 10. Overall Rehab/Functional Prognosis: good  MD Signature Leeroy Cha, MD

## 2021-07-15 ENCOUNTER — Other Ambulatory Visit (HOSPITAL_COMMUNITY): Payer: Medicare Other

## 2021-07-15 ENCOUNTER — Encounter (HOSPITAL_COMMUNITY): Payer: Self-pay | Admitting: Physical Medicine and Rehabilitation

## 2021-07-15 ENCOUNTER — Inpatient Hospital Stay (HOSPITAL_COMMUNITY)
Admission: RE | Admit: 2021-07-15 | Discharge: 2021-07-29 | DRG: 945 | Disposition: A | Discharge status: Other Acute Inpatient Hospital | Payer: Medicare Other | Source: Other Acute Inpatient Hospital | Attending: Physical Medicine and Rehabilitation | Admitting: Physical Medicine and Rehabilitation

## 2021-07-15 ENCOUNTER — Other Ambulatory Visit: Payer: Self-pay

## 2021-07-15 DIAGNOSIS — I2510 Atherosclerotic heart disease of native coronary artery without angina pectoris: Secondary | ICD-10-CM | POA: Diagnosis present

## 2021-07-15 DIAGNOSIS — G4733 Obstructive sleep apnea (adult) (pediatric): Secondary | ICD-10-CM | POA: Diagnosis not present

## 2021-07-15 DIAGNOSIS — Z833 Family history of diabetes mellitus: Secondary | ICD-10-CM

## 2021-07-15 DIAGNOSIS — I82431 Acute embolism and thrombosis of right popliteal vein: Secondary | ICD-10-CM | POA: Diagnosis present

## 2021-07-15 DIAGNOSIS — I82412 Acute embolism and thrombosis of left femoral vein: Secondary | ICD-10-CM | POA: Diagnosis present

## 2021-07-15 DIAGNOSIS — E1165 Type 2 diabetes mellitus with hyperglycemia: Secondary | ICD-10-CM | POA: Diagnosis present

## 2021-07-15 DIAGNOSIS — D6959 Other secondary thrombocytopenia: Secondary | ICD-10-CM | POA: Diagnosis present

## 2021-07-15 DIAGNOSIS — G7281 Critical illness myopathy: Secondary | ICD-10-CM | POA: Diagnosis present

## 2021-07-15 DIAGNOSIS — D696 Thrombocytopenia, unspecified: Secondary | ICD-10-CM | POA: Diagnosis not present

## 2021-07-15 DIAGNOSIS — Z9114 Patient's other noncompliance with medication regimen: Secondary | ICD-10-CM

## 2021-07-15 DIAGNOSIS — Z6841 Body Mass Index (BMI) 40.0 and over, adult: Secondary | ICD-10-CM | POA: Diagnosis not present

## 2021-07-15 DIAGNOSIS — I5021 Acute systolic (congestive) heart failure: Secondary | ICD-10-CM | POA: Diagnosis not present

## 2021-07-15 DIAGNOSIS — E1122 Type 2 diabetes mellitus with diabetic chronic kidney disease: Secondary | ICD-10-CM | POA: Diagnosis not present

## 2021-07-15 DIAGNOSIS — K219 Gastro-esophageal reflux disease without esophagitis: Secondary | ICD-10-CM | POA: Diagnosis present

## 2021-07-15 DIAGNOSIS — E662 Morbid (severe) obesity with alveolar hypoventilation: Secondary | ICD-10-CM | POA: Diagnosis present

## 2021-07-15 DIAGNOSIS — D6489 Other specified anemias: Secondary | ICD-10-CM | POA: Diagnosis present

## 2021-07-15 DIAGNOSIS — I4892 Unspecified atrial flutter: Secondary | ICD-10-CM

## 2021-07-15 DIAGNOSIS — N182 Chronic kidney disease, stage 2 (mild): Secondary | ICD-10-CM | POA: Diagnosis not present

## 2021-07-15 DIAGNOSIS — M726 Necrotizing fasciitis: Secondary | ICD-10-CM | POA: Diagnosis present

## 2021-07-15 DIAGNOSIS — D709 Neutropenia, unspecified: Secondary | ICD-10-CM

## 2021-07-15 DIAGNOSIS — I110 Hypertensive heart disease with heart failure: Secondary | ICD-10-CM | POA: Diagnosis present

## 2021-07-15 DIAGNOSIS — L89150 Pressure ulcer of sacral region, unstageable: Secondary | ICD-10-CM | POA: Diagnosis present

## 2021-07-15 DIAGNOSIS — I82411 Acute embolism and thrombosis of right femoral vein: Secondary | ICD-10-CM | POA: Diagnosis present

## 2021-07-15 DIAGNOSIS — Z930 Tracheostomy status: Secondary | ICD-10-CM | POA: Diagnosis not present

## 2021-07-15 DIAGNOSIS — R5381 Other malaise: Secondary | ICD-10-CM | POA: Diagnosis present

## 2021-07-15 DIAGNOSIS — I82419 Acute embolism and thrombosis of unspecified femoral vein: Secondary | ICD-10-CM | POA: Diagnosis not present

## 2021-07-15 DIAGNOSIS — Z794 Long term (current) use of insulin: Secondary | ICD-10-CM | POA: Diagnosis not present

## 2021-07-15 DIAGNOSIS — S81802A Unspecified open wound, left lower leg, initial encounter: Secondary | ICD-10-CM | POA: Diagnosis not present

## 2021-07-15 DIAGNOSIS — L89320 Pressure ulcer of left buttock, unstageable: Secondary | ICD-10-CM | POA: Diagnosis present

## 2021-07-15 DIAGNOSIS — I480 Paroxysmal atrial fibrillation: Secondary | ICD-10-CM | POA: Diagnosis not present

## 2021-07-15 DIAGNOSIS — I429 Cardiomyopathy, unspecified: Secondary | ICD-10-CM | POA: Diagnosis present

## 2021-07-15 DIAGNOSIS — S31829A Unspecified open wound of left buttock, initial encounter: Secondary | ICD-10-CM | POA: Diagnosis not present

## 2021-07-15 DIAGNOSIS — J9611 Chronic respiratory failure with hypoxia: Secondary | ICD-10-CM | POA: Diagnosis present

## 2021-07-15 DIAGNOSIS — I5032 Chronic diastolic (congestive) heart failure: Secondary | ICD-10-CM | POA: Diagnosis not present

## 2021-07-15 DIAGNOSIS — Z7401 Bed confinement status: Secondary | ICD-10-CM

## 2021-07-15 DIAGNOSIS — Z931 Gastrostomy status: Secondary | ICD-10-CM

## 2021-07-15 DIAGNOSIS — Z79890 Hormone replacement therapy (postmenopausal): Secondary | ICD-10-CM

## 2021-07-15 DIAGNOSIS — D708 Other neutropenia: Secondary | ICD-10-CM | POA: Diagnosis present

## 2021-07-15 DIAGNOSIS — Z85038 Personal history of other malignant neoplasm of large intestine: Secondary | ICD-10-CM

## 2021-07-15 DIAGNOSIS — I5042 Chronic combined systolic (congestive) and diastolic (congestive) heart failure: Secondary | ICD-10-CM | POA: Diagnosis present

## 2021-07-15 DIAGNOSIS — Z20822 Contact with and (suspected) exposure to covid-19: Secondary | ICD-10-CM | POA: Diagnosis present

## 2021-07-15 DIAGNOSIS — I82441 Acute embolism and thrombosis of right tibial vein: Secondary | ICD-10-CM | POA: Diagnosis present

## 2021-07-15 DIAGNOSIS — N170 Acute kidney failure with tubular necrosis: Secondary | ICD-10-CM | POA: Diagnosis not present

## 2021-07-15 DIAGNOSIS — Z7952 Long term (current) use of systemic steroids: Secondary | ICD-10-CM

## 2021-07-15 DIAGNOSIS — F419 Anxiety disorder, unspecified: Secondary | ICD-10-CM | POA: Diagnosis not present

## 2021-07-15 DIAGNOSIS — Z01810 Encounter for preprocedural cardiovascular examination: Secondary | ICD-10-CM | POA: Diagnosis not present

## 2021-07-15 DIAGNOSIS — M25562 Pain in left knee: Secondary | ICD-10-CM

## 2021-07-15 DIAGNOSIS — L89159 Pressure ulcer of sacral region, unspecified stage: Secondary | ICD-10-CM | POA: Diagnosis not present

## 2021-07-15 DIAGNOSIS — I252 Old myocardial infarction: Secondary | ICD-10-CM

## 2021-07-15 DIAGNOSIS — E119 Type 2 diabetes mellitus without complications: Secondary | ICD-10-CM | POA: Diagnosis present

## 2021-07-15 DIAGNOSIS — I82413 Acute embolism and thrombosis of femoral vein, bilateral: Secondary | ICD-10-CM | POA: Diagnosis present

## 2021-07-15 DIAGNOSIS — M623 Immobility syndrome (paraplegic): Secondary | ICD-10-CM | POA: Diagnosis not present

## 2021-07-15 DIAGNOSIS — Z7901 Long term (current) use of anticoagulants: Secondary | ICD-10-CM

## 2021-07-15 DIAGNOSIS — D631 Anemia in chronic kidney disease: Secondary | ICD-10-CM | POA: Diagnosis not present

## 2021-07-15 DIAGNOSIS — W19XXXA Unspecified fall, initial encounter: Secondary | ICD-10-CM

## 2021-07-15 DIAGNOSIS — N1831 Chronic kidney disease, stage 3a: Secondary | ICD-10-CM | POA: Diagnosis not present

## 2021-07-15 DIAGNOSIS — J9621 Acute and chronic respiratory failure with hypoxia: Secondary | ICD-10-CM | POA: Diagnosis not present

## 2021-07-15 DIAGNOSIS — Z809 Family history of malignant neoplasm, unspecified: Secondary | ICD-10-CM

## 2021-07-15 DIAGNOSIS — K5900 Constipation, unspecified: Secondary | ICD-10-CM | POA: Diagnosis present

## 2021-07-15 DIAGNOSIS — Z79899 Other long term (current) drug therapy: Secondary | ICD-10-CM

## 2021-07-15 DIAGNOSIS — I82409 Acute embolism and thrombosis of unspecified deep veins of unspecified lower extremity: Secondary | ICD-10-CM | POA: Diagnosis not present

## 2021-07-15 LAB — GLUCOSE, CAPILLARY: Glucose-Capillary: 273 mg/dL — ABNORMAL HIGH (ref 70–99)

## 2021-07-15 LAB — POTASSIUM: Potassium: 4 mmol/L (ref 3.5–5.1)

## 2021-07-15 MED ORDER — DIPHENHYDRAMINE HCL 12.5 MG/5ML PO ELIX: 12.5000 mg | ORAL_SOLUTION | Freq: Four times a day (QID) | ORAL | Status: DC | PRN

## 2021-07-15 MED ORDER — OXYCODONE HCL 5 MG PO TABS
5.0000 mg | ORAL_TABLET | ORAL | Status: DC | PRN
  Administered 2021-07-17 – 2021-07-28 (×12): 5 mg via ORAL
  Filled 2021-07-15 (×13): qty 1

## 2021-07-15 MED ORDER — LATANOPROST 0.005 % OP SOLN
1.0000 [drp] | Freq: Every day | OPHTHALMIC | Status: DC
  Administered 2021-07-15 – 2021-07-28 (×14): 1 [drp] via OPHTHALMIC
  Filled 2021-07-15: qty 2.5

## 2021-07-15 MED ORDER — PREDNISONE 10 MG PO TABS
10.0000 mg | ORAL_TABLET | Freq: Every day | ORAL | Status: DC
  Administered 2021-07-16 – 2021-07-28 (×11): 10 mg via ORAL
  Filled 2021-07-15 (×13): qty 1

## 2021-07-15 MED ORDER — PREGABALIN 50 MG PO CAPS
50.0000 mg | ORAL_CAPSULE | Freq: Every day | ORAL | Status: DC
  Administered 2021-07-16 – 2021-07-28 (×13): 50 mg via ORAL
  Filled 2021-07-15 (×13): qty 1

## 2021-07-15 MED ORDER — PANTOPRAZOLE SODIUM 40 MG PO TBEC
40.0000 mg | DELAYED_RELEASE_TABLET | Freq: Two times a day (BID) | ORAL | Status: DC
  Administered 2021-07-15 – 2021-07-21 (×13): 40 mg via ORAL
  Filled 2021-07-15 (×11): qty 1

## 2021-07-15 MED ORDER — PROSOURCE PLUS PO LIQD
30.0000 mL | Freq: Two times a day (BID) | ORAL | Status: DC
  Administered 2021-07-16 (×2): 30 mL via ORAL
  Filled 2021-07-15 (×2): qty 30

## 2021-07-15 MED ORDER — RENA-VITE PO TABS
1.0000 | ORAL_TABLET | Freq: Every day | ORAL | Status: DC
  Administered 2021-07-15: 1 via ORAL
  Filled 2021-07-15: qty 1

## 2021-07-15 MED ORDER — INSULIN ASPART 100 UNIT/ML IJ SOLN
0.0000 [IU] | Freq: Three times a day (TID) | INTRAMUSCULAR | Status: DC
  Administered 2021-07-15: 5 [IU] via SUBCUTANEOUS
  Administered 2021-07-16: 3 [IU] via SUBCUTANEOUS
  Administered 2021-07-16: 5 [IU] via SUBCUTANEOUS
  Administered 2021-07-17: 3 [IU] via SUBCUTANEOUS
  Administered 2021-07-17: 9 [IU] via SUBCUTANEOUS
  Administered 2021-07-17 – 2021-07-18 (×2): 5 [IU] via SUBCUTANEOUS
  Administered 2021-07-18: 9 [IU] via SUBCUTANEOUS

## 2021-07-15 MED ORDER — ACETAMINOPHEN 325 MG PO TABS
325.0000 mg | ORAL_TABLET | ORAL | Status: DC | PRN
  Administered 2021-07-18 – 2021-07-27 (×3): 650 mg via ORAL
  Filled 2021-07-15 (×3): qty 2

## 2021-07-15 MED ORDER — LORATADINE 10 MG PO TABS
10.0000 mg | ORAL_TABLET | Freq: Every day | ORAL | Status: DC
  Administered 2021-07-16 – 2021-07-28 (×13): 10 mg via ORAL
  Filled 2021-07-15 (×14): qty 1

## 2021-07-15 MED ORDER — BUSPIRONE HCL 5 MG PO TABS
7.5000 mg | ORAL_TABLET | Freq: Two times a day (BID) | ORAL | Status: DC
  Administered 2021-07-15 – 2021-07-29 (×26): 7.5 mg via ORAL
  Filled 2021-07-15 (×10): qty 2
  Filled 2021-07-15: qty 1.5
  Filled 2021-07-15 (×4): qty 2
  Filled 2021-07-15: qty 1.5
  Filled 2021-07-15 (×13): qty 2

## 2021-07-15 MED ORDER — TRAZODONE HCL 50 MG PO TABS: 25.0000 mg | ORAL_TABLET | Freq: Every evening | ORAL | Status: DC | PRN

## 2021-07-15 MED ORDER — CALCIUM POLYCARBOPHIL 625 MG PO TABS
625.0000 mg | ORAL_TABLET | Freq: Two times a day (BID) | ORAL | Status: DC
  Administered 2021-07-15 – 2021-07-28 (×25): 625 mg via ORAL
  Filled 2021-07-15 (×26): qty 1

## 2021-07-15 MED ORDER — PROCHLORPERAZINE EDISYLATE 10 MG/2ML IJ SOLN
5.0000 mg | Freq: Four times a day (QID) | INTRAMUSCULAR | Status: DC | PRN
  Filled 2021-07-15: qty 2

## 2021-07-15 MED ORDER — PRIMIDONE 50 MG PO TABS
50.0000 mg | ORAL_TABLET | Freq: Every day | ORAL | Status: DC
  Administered 2021-07-16 – 2021-07-28 (×11): 50 mg via ORAL
  Filled 2021-07-15 (×14): qty 1

## 2021-07-15 MED ORDER — IPRATROPIUM-ALBUTEROL 0.5-2.5 (3) MG/3ML IN SOLN
3.0000 mL | Freq: Four times a day (QID) | RESPIRATORY_TRACT | Status: DC
  Administered 2021-07-16: 3 mL via RESPIRATORY_TRACT
  Filled 2021-07-15 (×2): qty 3

## 2021-07-15 MED ORDER — PROCHLORPERAZINE MALEATE 5 MG PO TABS: 5.0000 mg | ORAL_TABLET | Freq: Four times a day (QID) | ORAL | Status: DC | PRN

## 2021-07-15 MED ORDER — AMIODARONE HCL 200 MG PO TABS
200.0000 mg | ORAL_TABLET | Freq: Every day | ORAL | Status: DC
Start: 2021-07-16 — End: 2021-07-16
  Administered 2021-07-16: 200 mg via ORAL
  Filled 2021-07-15: qty 1

## 2021-07-15 MED ORDER — FREE WATER
100.0000 mL | Freq: Four times a day (QID) | Status: DC
  Administered 2021-07-15 – 2021-07-23 (×29): 100 mL

## 2021-07-15 MED ORDER — BISACODYL 10 MG RE SUPP
10.0000 mg | Freq: Every day | RECTAL | Status: DC | PRN
Start: 2021-07-15 — End: 2021-07-29

## 2021-07-15 MED ORDER — ZINC SULFATE 220 (50 ZN) MG PO CAPS
220.0000 mg | ORAL_CAPSULE | Freq: Every day | ORAL | Status: AC
  Administered 2021-07-15 – 2021-07-28 (×14): 220 mg via ORAL
  Filled 2021-07-15 (×14): qty 1

## 2021-07-15 MED ORDER — VITAL 1.5 CAL PO LIQD
550.0000 mL | ORAL | Status: DC
  Filled 2021-07-15: qty 711
  Filled 2021-07-15: qty 1000

## 2021-07-15 MED ORDER — ASCORBIC ACID 500 MG PO TABS
500.0000 mg | ORAL_TABLET | Freq: Two times a day (BID) | ORAL | Status: DC
  Administered 2021-07-15 – 2021-07-28 (×27): 500 mg via ORAL
  Filled 2021-07-15 (×28): qty 1

## 2021-07-15 MED ORDER — INSULIN GLARGINE-YFGN 100 UNIT/ML ~~LOC~~ SOLN
2.0000 [IU] | Freq: Every day | SUBCUTANEOUS | Status: DC
Start: 2021-07-15 — End: 2021-07-17
  Administered 2021-07-15 – 2021-07-16 (×2): 2 [IU] via SUBCUTANEOUS
  Filled 2021-07-15 (×3): qty 0.02

## 2021-07-15 MED ORDER — INSULIN ASPART 100 UNIT/ML IJ SOLN
0.0000 [IU] | Freq: Every day | INTRAMUSCULAR | Status: DC
  Administered 2021-07-15 – 2021-07-17 (×3): 3 [IU] via SUBCUTANEOUS
  Administered 2021-07-18 – 2021-07-20 (×3): 4 [IU] via SUBCUTANEOUS
  Administered 2021-07-21: 3 [IU] via SUBCUTANEOUS
  Administered 2021-07-22 – 2021-07-27 (×4): 2 [IU] via SUBCUTANEOUS

## 2021-07-15 MED ORDER — RISPERIDONE 1 MG PO TABS
1.0000 mg | ORAL_TABLET | Freq: Two times a day (BID) | ORAL | Status: DC
  Administered 2021-07-15 – 2021-07-29 (×26): 1 mg via ORAL
  Filled 2021-07-15 (×30): qty 1

## 2021-07-15 MED ORDER — POLYSACCHARIDE IRON COMPLEX 150 MG PO CAPS
150.0000 mg | ORAL_CAPSULE | Freq: Every day | ORAL | Status: DC
  Administered 2021-07-15 – 2021-07-21 (×7): 150 mg via ORAL
  Filled 2021-07-15 (×5): qty 1

## 2021-07-15 MED ORDER — FLEET ENEMA 7-19 GM/118ML RE ENEM: 1.0000 | ENEMA | Freq: Once | RECTAL | Status: DC | PRN

## 2021-07-15 MED ORDER — POLYETHYLENE GLYCOL 3350 17 G PO PACK
17.0000 g | PACK | Freq: Every day | ORAL | Status: DC | PRN
  Filled 2021-07-15: qty 1

## 2021-07-15 MED ORDER — METOPROLOL TARTRATE 50 MG PO TABS
50.0000 mg | ORAL_TABLET | Freq: Two times a day (BID) | ORAL | Status: DC
  Administered 2021-07-15 – 2021-07-24 (×18): 50 mg via ORAL
  Filled 2021-07-15 (×17): qty 1

## 2021-07-15 MED ORDER — MELATONIN 3 MG PO TABS
3.0000 mg | ORAL_TABLET | Freq: Every day | ORAL | Status: DC
  Administered 2021-07-15 – 2021-07-28 (×14): 3 mg via ORAL
  Filled 2021-07-15 (×14): qty 1

## 2021-07-15 MED ORDER — VITAMIN D 25 MCG (1000 UNIT) PO TABS
1000.0000 [IU] | ORAL_TABLET | Freq: Every day | ORAL | Status: DC
  Administered 2021-07-16 – 2021-07-28 (×11): 1000 [IU] via ORAL
  Filled 2021-07-15 (×12): qty 1

## 2021-07-15 MED ORDER — LEVOTHYROXINE SODIUM 75 MCG PO TABS
75.0000 ug | ORAL_TABLET | Freq: Every day | ORAL | Status: DC
  Administered 2021-07-16 – 2021-07-29 (×11): 75 ug via ORAL
  Filled 2021-07-15 (×12): qty 1

## 2021-07-15 MED ORDER — SERTRALINE HCL 100 MG PO TABS
100.0000 mg | ORAL_TABLET | Freq: Every day | ORAL | Status: DC
  Administered 2021-07-15 – 2021-07-28 (×14): 100 mg via ORAL
  Filled 2021-07-15 (×14): qty 1

## 2021-07-15 MED ORDER — AMANTADINE HCL 50 MG/5ML PO SOLN
50.0000 mg | Freq: Every day | ORAL | Status: DC
  Administered 2021-07-16 – 2021-07-28 (×11): 50 mg via ORAL
  Filled 2021-07-15 (×14): qty 5

## 2021-07-15 MED ORDER — PROCHLORPERAZINE 25 MG RE SUPP: 12.5000 mg | Freq: Four times a day (QID) | RECTAL | Status: DC | PRN

## 2021-07-15 MED ORDER — GUAIFENESIN-DM 100-10 MG/5ML PO SYRP: 5.0000 mL | ORAL_SOLUTION | Freq: Four times a day (QID) | ORAL | Status: DC | PRN

## 2021-07-15 MED ORDER — ALUM & MAG HYDROXIDE-SIMETH 200-200-20 MG/5ML PO SUSP: 30.0000 mL | ORAL | Status: DC | PRN

## 2021-07-15 MED ORDER — NAPHAZOLINE-GLYCERIN 0.012-0.25 % OP SOLN
1.0000 [drp] | Freq: Four times a day (QID) | OPHTHALMIC | Status: DC | PRN
  Filled 2021-07-15: qty 15

## 2021-07-15 NOTE — Progress Notes (Addendum)
Admitted to the unit with no complications. Patient oriented to assigned room Patient accompanied by daughter. Admission complete.

## 2021-07-15 NOTE — Progress Notes (Signed)
Inpatient Rehabilitation Admission Medication Review by a Pharmacist  A complete drug regimen review was completed for this patient to identify any potential clinically significant medication issues.  High Risk Drug Classes Is patient taking? Indication by Medication  Antipsychotic Yes Compazine for N/V Risperdal for behavior/mood  Anticoagulant No   Antibiotic No   Opioid Yes Oxycodone for pain  Antiplatelet No   Hypoglycemics/insulin Yes SSI fo rh/o DM  Vasoactive Medication No   Chemotherapy No   Other Yes Prednisone for COPD     Clinically significant medication issues were identified that warrant physician communication and completion of prescribed/recommended actions by midnight of the next day:  No  Time spent performing this drug regimen review (minutes):  15 min  Deltha Bernales S. Alford Highland, PharmD, BCPS Clinical Staff Pharmacist [email or web address] [place] 07/15/2021 4:58 PM

## 2021-07-15 NOTE — Progress Notes (Addendum)
PMR Admission Coordinator Pre-Admission Assessment   Patient: Tony Young is an 66 y.o., male MRN: [id number] DOB: 05-Oct-1955 Height:   Weight: 112.9 kg   Insurance Information HMO:     PPO:      PCP:      IPA:      80/20: yes     OTHER:  PRIMARY: Medicare A and B      Policy#: [id number]      Subscriber: Pt  Phone#: Verified online    Fax#:  Pre-Cert#:       Employer:  Benefits:  Phone #:      Name:  Eff. Date: Parts A ad B effective 07/02/2019  Deduct: $1600      Out of Pocket Max:  None      Life Max: N/A  CIR: 100%      SNF: 100 days Outpatient: 80%     Co-Pay: 20% Home Health: 100%      Co-Pay: none DME: 80%     Co-Pay: 20% Providers: patient's choice SECONDARY: [employer]       Policy#: [id number]      Phone#:    Financial Counselor:       Phone#:    The Data Collection Information Summary for patients in Inpatient Rehabilitation Facilities with attached Privacy Act [place] Records was provided and verbally reviewed with: Family   Emergency Contact Information Contact Information       Name Relation Home Work Mobile    [place] Daughter     [phone]    Tag, Wurtz     [phone]           Current Medical History  Patient Admitting Diagnosis: Respiratory Failure  History of Present Illness: Tony Young is an 66 y.o. male with medical history significant for COPD, congestive heart failure, diabetes mellitus, coronary artery disease who was admitted to the acute facility on 05/13/2021 with complaints of worsening shortness of breath, fever, found to have necrotizing fasciitis of the left lower extremity.  He was started on broad-spectrum antimicrobials. He underwent multiple debridements.  Hospital course was complicated by acute kidney injury needing dialysis, respiratory failure, sepsis with shock, GI bleed, bacteremia.  He was seen by GI, wound care, cardiology, general surgery, nephrology, infectious disease.   He was initially treated with IV vancomycin.  However, due to the renal failure vancomycin was discontinued and he was started on doxycycline.  He also had respiratory failure due to sepsis and shock and was placed on mechanical ventilation and ended up with a tracheostomy.  He was eventually stabilized and transferred to [facility] on 06/10/2021. Pt. Has experienced renal recovery and no longer requires HD. He has also been weaned from the vent and is tolerating capping trials.    Patient's medical record from [facility] has been reviewed by the rehabilitation admission coordinator and physician.   Past Medical History  No past medical history on file.   Has the patient had major surgery during 100 days prior to admission? Yes   Family History   family history is not on file.   Current Medications No current facility-administered medications for this encounter.   Patients Current Diet: Diet Dysphagia 1, Thin liquid    Precautions / Restrictions Precautions: Fall Precautions/Special Needs: Swallowing Weight Bearing Restrictions: No     Has the patient had 2 or more falls or a fall with injury in the past year? No   Prior Activity Level Community (5-7x/wk): Pt.  was active in the community PTA     Prior Functional Level Self Care: Did the patient need help bathing, dressing, using the toilet or eating? Independent   Indoor Mobility: Did the patient need assistance with walking from room to room (with or without device)? Independent   Stairs: Did the patient need assistance with internal or external stairs (with or without device)? Independent   Functional Cognition: Did the patient need help planning regular tasks such as shopping or remembering to take medications? Independent   Patient Information Are you of Hispanic, Latino/a,or Spanish origin?: A. No, not of Hispanic, Latino/a, or Spanish origin What is your race?: A. White Do you need or want  an interpreter to communicate with a doctor or health care staff?: 0. No   Patient's Response To:  Health Literacy and Transportation Is the patient able to respond to health literacy and transportation needs?: Yes Health Literacy - How often do you need to have someone help you when you read instructions, pamphlets, or other written material from your doctor or pharmacy?: Always In the past 12 months, has lack of transportation kept you from medical appointments or from getting medications?: No In the past 12 months, has lack of transportation kept you from meetings, work, or from getting things needed for daily living?: No   Home Assistive Devices / Equipment BIPAP     Prior Device Use: Indicate devices/aids used by the patient prior to current illness, exacerbation or injury? None of the above     Prior Functional Level Current Functional Level  Bed Mobility   Independent  Max A   Transfers   Independent  Not attempted  Mobility - Walk/Wheelchair   Independent  Not attempted  Upper Body Dressing   Independent  Mod A  Lower Body Dressing   Independent  Total A  Grooming   Independent  Mod A  Eating/Drinking   Independent  Max A  Toilet Transfer   Independent  Not attempted  Bladder Continence    Continent of bladder Incontinent of bladder  Bowel Management   Continent of bowel Incontinent of bowel  Stair Climbing   Not attempted  Not attempted  Communication   Independent  Mod A   Memory   Independent  Mod A    Special Needs/ Care Considerations Special Bed bariatric bed , Skin LLE wound, Special service needs n/a, Bowel management incontinent, and Bladder management incontinent   Previous Home Environment (from acute therapy documentation) Living Arrangements: Children  Lives With: Family Available Help at Discharge: Family; Available 24 hours/day Type of Home: House Home Layout: One level Home Access: Ramped entrance Bathroom Shower/Tub: Tub/shower  unit Bathroom Toilet: Handicapped height Bathroom Accessibility: Yes How Accessible: Accessible via walker; Accessible via wheelchair Home Care Services: No     Discharge Living Setting Plans for Discharge Living Setting: Patient's home; House Type of Home at Discharge: House Discharge Home Layout: One level Discharge Home Access: [place] entrance Discharge Bathroom Shower/Tub: [place] unit Discharge Bathroom Toilet: Handicapped height Discharge Bathroom Accessibility: No Does the patient have any problems obtaining your medications?: No     Social/Family/Support Systems Patient Roles: Other (Comment) Contact Information: [phone] Anticipated Caregiver: Corey Harold (Pt.'s son and son and law will also assist) Anticipated Caregiver's Contact Information: [phone] Ability/Limitations of Caregiver: Can provide Mod A Caregiver Availability: 24/7 Discharge Plan Discussed with Primary Caregiver: Yes Is Caregiver In Agreement with Plan?: Yes Does Caregiver/Family have Issues with Lodging/Transportation while Pt is in Rehab?: Yes  Goals Patient/Family Goal for Rehab: PT/OT Mod A, SLP Supervision Expected length of stay: 16-18 days Pt/Family Agrees to Admission and willing to participate: Yes Program Orientation Provided & Reviewed with Pt/Caregiver Including Roles  & Responsibilities: Yes     Decrease burden of Care through IP rehab admission: Specialzed equipment needs, Decannulation, Diet advancement, Decrease number of caregivers, Bowel and bladder program, and Patient/family education   Possible need for SNF placement upon discharge: not anticipated   Patient Condition: I have reviewed medical records from [facility] , spoken with CM, and patient and daughter. I met with patient at the bedside for inpatient rehabilitation assessment.  Patient will benefit from ongoing PT and OT, can actively participate in 3 hours of therapy a day 5 days of the  week, and can make measurable gains during the admission.  Patient will also benefit from the coordinated team approach during an Inpatient Acute Rehabilitation admission.  The patient will receive intensive therapy as well as Rehabilitation physician, nursing, social worker, and care management interventions.  Due to bladder management, bowel management, safety, skin/wound care, disease management, medication administration, pain management, and patient education the patient requires 24 hour a day rehabilitation nursing.  The patient is currently max-total A with mobility and basic ADLs.  Discharge setting and therapy post discharge at home with home health is anticipated.  Patient has agreed to participate in the Acute Inpatient Rehabilitation Program and will admit today.   Preadmission Screen Completed By:  Genella Mech, 07/14/2021 2:46 PM ______________________________________________________________________   Discussed status with Dr. Ranell Patrick  on 07/15/21 at 41 and received approval for admission today.   Admission Coordinator:  Genella Mech, CCC-SLP, time 1030/Date 07/15/21    Assessment/Plan: Diagnosis: Debility Does the need for close, 24 hr/day Medical supervision in concert with the patient's rehab needs make it unreasonable for this patient to be served in a less intensive setting? Yes Co-Morbidities requiring supervision/potential complications: acute on chronic respiratory failure with hypoxia, severe sepsis with septic shock, chronic heart failure, atrial fibrillation, acute renal failure due to tubular necrosis.  Due to bladder management, bowel management, safety, skin/wound care, disease management, medication administration, pain management, and patient education, does the patient require 24 hr/day rehab nursing? Yes Does the patient require coordinated care of a physician, rehab nurse, PT, OT, and SLP to address physical and functional deficits in the context of the above medical  diagnosis(es)? Yes Addressing deficits in the following areas: balance, endurance, locomotion, strength, transferring, bowel/bladder control, bathing, dressing, feeding, grooming, toileting, and psychosocial support Can the patient actively participate in an intensive therapy program of at least 3 hrs of therapy 5 days a week? Yes The potential for patient to make measurable gains while on inpatient rehab is good Anticipated functional outcomes upon discharge from inpatient rehab: mod assist PT, mod assist OT, independent SLP Estimated rehab length of stay to reach the above functional goals is: 14 days Anticipated discharge destination: Home 10. Overall Rehab/Functional Prognosis: good   MD Signature Leeroy Cha, MD

## 2021-07-15 NOTE — H&P (Incomplete)
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 He was treated with IV diuresis for fluid overload, started on steroids due to concerns of AECOPD and required CPAP. He developed A fib with RVR  in ED treated with diltiazem which was d/c due to hypotension with septic shock due to necrotizing fasciitis of LLE with strep pyogenes bacteremia. He was intubated due to respiratory failure, required pressors for BP support but went on to developed acute renal failure due to ATN with oliguria. He required multiple I and D of LLE 12/15, 12/16 and 12/24 with local care.  He required CRRT and transitioned to HD once stable. He was started on IV heparin but developed GIB 12/24 with EGD revealing gastric ulcer and required multiple units PRBCs. Hospital course significant for NSTEMI, fluid overload, shocked liver with thrombocytopenia --DIC panel negative, encephalopathy,  VDRF requiring PEG/Trach on 12/27 and continued to be vent dependent with somnolence. IV antibiotics transitioned to doxycycline and flagyl thorough 06/20/21 per ID input.  He was discharged to [facility] 06/10/21  for further management.  While in [facility], he did develop SIRS/MDRO PNA and was treated with Zyvox X 7 days in addition to  solumedrol for AECOPD. He has had bouts of rapid A fib treated and was started on BB but resting heart rate remains 115-120 range without cardiac symptoms.  Modafinil and amantadine added to help manage tachycardia. His respiratory status has been improving and he was extubated to ATC by 01/25. He has had issues with overload but unable to tolerate aggressive diuresis due to recurrent AKI/Hypernatremia.  He  is tolerating plugging during the day with ATC at nights and continues on steroid taper.  Hypernatremia has resolved and renal status is improving. MBS performed on 02/09 and he was started on D1, thins with full supervision for safety--No straws. Tube feeds changed to nights and lantus being titrated upwards for BS management. Mentation has improved with increase in activity tolerance but he continues to be limited by significant weakness. CIR recommended due to functional decline.    Review of Systems  Constitutional:  Negative for chills and fever.  HENT:  Negative for hearing loss.   Eyes:  Positive for blurred vision (needs glasses).  Respiratory:  Negative for cough and shortness of breath.   Cardiovascular:  Positive for leg swelling. Negative for chest pain and palpitations.  Gastrointestinal:  Positive for diarrhea. Negative for abdominal pain and nausea.  Genitourinary:  Positive for urgency.  Musculoskeletal:  Positive for myalgias.  Neurological:  Positive for focal weakness (RUE weakness --new since admission) and weakness. Negative for dizziness and headaches.  Psychiatric/Behavioral:  The patient is not nervous/anxious.     Past Medical History:  Diagnosis Date   CAD (coronary artery disease)    Cardiomyopathy ([place])    Colon cancer (HCC)    COPD (chronic obstructive pulmonary disease) (HCC)    OSA (obstructive sleep apnea)    Type 2 diabetes mellitus ([place])     Past Surgical History:  Procedure Laterality Date   COLON RESECTION     IR REMOVAL TUN CV CATH W/O FL  07/01/2021   PEG PLACEMENT  05/26/2021   TRACHEOSTOMY  05/26/2021  Family History  Problem Relation Age of Onset   Cancer Father    Diabetes Maternal Grandfather     Social History: Widowed. Family has been staying with him. Independent without AD. 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 He does not smoke, use smokeless tobacco or alcohol.    Allergies  Allergen Reactions    Penicillins     No medications prior to admission.    Home: Home Living Living Arrangements: Children Available Help at Discharge: Family, Available 24 hours/day Type of Home: House Home Access: Ramped entrance Home Layout: One level Bathroom Shower/Tub: Chiropodist: Handicapped height Bathroom Accessibility: Yes  Lives With: Family   Functional History: Prior Function Prior Level of Function : Independent/Modified Independent Mobility Comments: Walking wihtout device ADLs Comments: independent  Functional Status:  Mobility: Max assist for bed mobility Working on sitting at EOB     ADL: Needs assistance with meals due to BUE weakness Mod assist with UB dressing Total assist with LB dressing.   Cognition:       Weight 112.9 kg. Physical Exam Constitutional:      Appearance: He is morbidly obese.     Comments: Plugged trach in place.   Cardiovascular:     Rate and Rhythm: Tachycardia present.     Comments: Resting heart rate 115- 120s during exam.  Pulmonary:     Breath sounds: No rhonchi.  Abdominal:     Comments: Obese with PEG in place.   Musculoskeletal:        General: Swelling present.     Comments: 2-3+ edema LLE. Dependent edema bilateral forearms R>L.   Skin:    General: Skin is warm and dry.     Comments: Multiple scabs on bilateral hands. LLE with bulking dressing and dried serous drainage.   Neurological:     Mental Status: He is alert and oriented to person, place, and time.     Comments: Speech mildly dysarthric without dysphonia. Able to follow simple motor commands without difficulty. RUE weakness noted. Tends to keep LLE rotated outwards    Results for orders placed or performed during the hospital encounter of 06/10/21 (from the past 48 hour(s))  CBC     Status: Abnormal   Collection Time: 07/14/21  3:45 AM  Result Value Ref Range   WBC 3.9 (L) 4.0 - 10.5 K/uL   RBC 3.31 (L) 4.22 - 5.81 MIL/uL   Hemoglobin 9.5  (L) 13.0 - 17.0 g/dL   HCT 29.9 (L) 39.0 - 52.0 %   MCV 90.3 80.0 - 100.0 fL   MCH 28.7 26.0 - 34.0 pg   MCHC 31.8 30.0 - 36.0 g/dL   RDW 15.9 (H) 11.5 - 15.5 %   Platelets 162 150 - 400 K/uL   nRBC 0.8 (H) 0.0 - 0.2 %    Comment: Performed at [facility], [address] [place], [place] [address]  Renal function panel     Status: Abnormal   Collection Time: 07/14/21  3:45 AM  Result Value Ref Range   Sodium 141 135 - 145 mmol/L   Potassium 3.2 (L) 3.5 - 5.1 mmol/L   Chloride 103 98 - 111 mmol/L   CO2 26 22 - 32 mmol/L   Glucose, Bld 170 (H) 70 - 99 mg/dL    Comment: Glucose reference range applies only to samples taken after fasting for at least 8 hours.   BUN 44 (H) 8 - 23 mg/dL   Creatinine, Ser 1.22 0.61 -  1.24 mg/dL   Calcium 10.1 8.9 - 10.3 mg/dL   Phosphorus 3.8 2.5 - 4.6 mg/dL   Albumin 2.5 (L) 3.5 - 5.0 g/dL   GFR, Estimated >60 >60 mL/min    Comment: (NOTE) Calculated using the CKD-EPI Creatinine Equation (2021)    Anion gap 12 5 - 15    Comment: Performed at [facility] [address], [place], [place] [address]  Magnesium     Status: None   Collection Time: 07/14/21  3:45 AM  Result Value Ref Range   Magnesium 2.0 1.7 - 2.4 mg/dL    Comment: Performed at [facility] [address] [place], [place] [address]  Potassium     Status: None   Collection Time: 07/15/21  6:18 AM  Result Value Ref Range   Potassium 4.0 3.5 - 5.1 mmol/L    Comment: Performed at [facility], [place] [address] [place],  [address]   DG Chest Port 1 View  Result Date: 07/15/2021 CLINICAL DATA:  66 year old male with acute on chronic respiratory failure. EXAM: PORTABLE CHEST 1 VIEW COMPARISON:  Portable chest 07/06/2021 and earlier. FINDINGS: Portable AP semi upright view at 0630 hours. Satisfactory tracheostomy. Stable mild cardiomegaly. Other mediastinal contours are within normal limits. Improved lung volumes. Allowing for portable technique the lungs are  clear. No pneumothorax or pleural effusion. No acute osseous abnormality identified. Paucity of bowel gas in the upper abdomen. IMPRESSION: Satisfactory tracheostomy.  No acute cardiopulmonary abnormality. Electronically Signed   By: Genevie Ann M.D.   On: 07/15/2021 07:21      Weight 112.9 kg.  Medical Problem List and Plan: 1. Functional deficits secondary to ***  -patient may *** shower  -ELOS/Goals: *** 2.  Antithrombotics: -DVT/anticoagulation:  Mechanical: Sequential compression devices, below knee Right lower extremity  --high risk for DVTs due to body habitus and immobility. Will check dopplers in am.   -antiplatelet therapy: N/A 3. Pain Management:  Oxycodone prn.  4. Mood: LCSW to follow for evaluation and support.   -antipsychotic agents:  Risperdal bid 5. Neuropsych: This patient is capable of making decisions on his own behalf. 6. Skin/Wound Care: Air mattress due to sacral decub  --continue vitamins and supplements to promote wound healing.  7. Fluids/Electrolytes/Nutrition:  Strict I/O as continues to show signs of overload/anasarca.  --low salt diet. Continue Juven to help with protein stores. 8. Strep pyogenes bacteremia/Necrotizing fascitis LLE s/p I & D: Continue daily dressing changes.   --maintain adequate nutritional status to help promote healing. 9. AECOPD: Respiratory status stable. Continue steriod taper 10 OSA/OHS?/VDRF: Continue button plugging during the day and ATC at nights for humidification  --Started discussion that he will likely go home with trach.  11. H/o GIB: H/H improving. Continue PPI BID.   --no signs of bleeding. Will start low dose Lovenox if platelets stable.  12. Thrombocytopenia: Platelets continue to fluctuate.  --Will recheck CBC in am to decide on initiation of AC 13.  T2DM:  Check Hgb A1c in am. Will monitor BS ac/hs with SSI for elevated BS  --continue Lantus 28 units BID and titrate insulin as needed.  --continue tube feeds at nights  (likely contributing to diarrhea)      ***  Bary Leriche, PA-C 07/15/2021

## 2021-07-15 NOTE — Progress Notes (Signed)
[facility]   PULMONARY CRITICAL CARE SERVICE  PROGRESS NOTE     Tony Young  PJA:[id number]  DOB: 1955-06-13   DOA: 06/10/2021  Referring Physician: Satira Sark, MD  HPI: Tony Young is a 66 y.o. male being followed for ventilator/airway/oxygen weaning Acute on Chronic Respiratory Failure.  Patient is on T collar currently has been doing well with capping trials during the daytime  Medications: Reviewed on Rounds  Physical Exam:  Vitals: Temperature 97.7 pulse 112 respiratory is 18 blood pressure is 106/60 saturations 97%  Ventilator Settings T collar at night capping during the daytime  General: Comfortable at this time Neck: supple Cardiovascular: no malignant arrhythmias Respiratory: Scattered coarse breath sounds are noted Skin: no rash seen on limited exam Musculoskeletal: No gross abnormality Psychiatric:unable to assess Neurologic:no involuntary movements         Lab Data:   Basic Metabolic Panel: Recent Labs  Lab 07/09/21 0346 07/10/21 0453 07/12/21 0310 07/14/21 0345 07/15/21 0618  NA 144  --  142 141  --   K 3.1* 3.6 3.6 3.2* 4.0  CL 106  --  106 103  --   CO2 26  --  25 26  --   GLUCOSE 167*  --  76 170*  --   BUN 62*  --  49* 44*  --   CREATININE 1.25*  --  1.20 1.22  --   CALCIUM 10.5*  --  10.1 10.1  --   MG 2.0  --  2.1 2.0  --   PHOS 4.5  --  4.1 3.8  --     ABG: No results for input(s): PHART, PCO2ART, PO2ART, HCO3, O2SAT in the last 168 hours.  Liver Function Tests: Recent Labs  Lab 07/09/21 0346 07/12/21 0310 07/14/21 0345  ALBUMIN 2.6* 2.6* 2.5*   No results for input(s): LIPASE, AMYLASE in the last 168 hours. No results for input(s): AMMONIA in the last 168 hours.  CBC: Recent Labs  Lab 07/09/21 0346 07/12/21 0310 07/14/21 0345  WBC 4.4 3.4* 3.9*  HGB 9.7* 9.0* 9.5*  HCT 31.2* 29.1* 29.9*  MCV 91.5 91.2 90.3  PLT 114* 148* 162     Cardiac Enzymes: No results for input(s): CKTOTAL, CKMB, CKMBINDEX, TROPONINI in the last 168 hours.  BNP (last 3 results) No results for input(s): BNP in the last 8760 hours.  ProBNP (last 3 results) No results for input(s): PROBNP in the last 8760 hours.  Radiological Exams: DG Chest Port 1 View  Result Date: 07/15/2021 CLINICAL DATA:  66 year old male with acute on chronic respiratory failure. EXAM: PORTABLE CHEST 1 VIEW COMPARISON:  Portable chest 07/06/2021 and earlier. FINDINGS: Portable AP semi upright view at 0630 hours. Satisfactory tracheostomy. Stable mild cardiomegaly. Other mediastinal contours are within normal limits. Improved lung volumes. Allowing for portable technique the lungs are clear. No pneumothorax or pleural effusion. No acute osseous abnormality identified. Paucity of bowel gas in the upper abdomen. IMPRESSION: Satisfactory tracheostomy.  No acute cardiopulmonary abnormality. Electronically Signed   By: Genevie Ann M.D.   On: 07/15/2021 07:21    Assessment/Plan Active Problems:   Acute on chronic respiratory failure with hypoxia (HCC)   Severe sepsis with septic shock (HCC)   Tracheostomy status (HCC)   Chronic heart failure with preserved ejection fraction (HCC)   AF (paroxysmal atrial fibrillation) ([place])   Acute renal failure due to tubular necrosis ([place])   Acute on chronic respiratory failure hypoxia plan is  to continue with the capping during the daytime awaiting family training Severe sepsis resolved hemodynamics are stable Tracheostomy remains in place for now Chronic heart failure preserved ejection fraction supportive care Atrial fibrillation rate is controlled we will continue to monitor Acute renal failure supportive care following up on labs   I have personally seen and evaluated the patient, evaluated laboratory and imaging results, formulated the assessment and plan and placed orders. The Patient requires high complexity decision making with  multiple systems involvement.  Rounds were done with the Respiratory Therapy Director and Staff therapists and discussed with nursing staff also.  [name], MD [facility] Pulmonary Critical Care Medicine Sleep Medicine

## 2021-07-15 NOTE — H&P (Signed)
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 He was treated with IV diuresis for fluid overload, started on steroids due to concerns of AECOPD and required CPAP. He developed A fib with RVR  in ED treated with diltiazem which was d/c due to hypotension with septic shock due to necrotizing fasciitis of LLE with strep pyogenes bacteremia. He was intubated due to respiratory failure, required pressors for BP support but went on to developed acute renal failure due to ATN with oliguria. He required multiple I and D of LLE 12/15, 12/16 and 12/24 with local care.  He required CRRT and transitioned to HD once stable. He was started on IV heparin but developed GIB 12/24 with EGD revealing gastric ulcer and required multiple units PRBCs. Hospital course significant for NSTEMI, fluid overload, shocked liver with thrombocytopenia --DIC panel negative, encephalopathy,  VDRF requiring PEG/Trach on 12/27 and continued to be vent dependent with somnolence. IV antibiotics transitioned to doxycycline and flagyl thorough 06/20/21 per ID input.  He was discharged to [facility] 06/10/21  for further management.  While in [facility], he did develop SIRS/MDRO PNA and was treated with Zyvox X 7 days in addition to  solumedrol for AECOPD. He has had bouts of rapid A fib treated and was started on BB but resting heart rate remains 115-120 range without cardiac symptoms.  Modafinil and amantadine added to help manage tachycardia. His respiratory status has been improving and he was extubated to ATC by 01/25. He has had issues with overload but unable to tolerate aggressive diuresis due to recurrent AKI/Hypernatremia.  He  is tolerating plugging during the day with ATC at nights and continues on steroid taper.  Hypernatremia has resolved and renal status is improving. MBS performed on 02/09 and he was started on D1, thins with full supervision for safety--No straws. Tube feeds changed to nights and lantus being titrated upwards for BS management. Mentation has improved with increase in activity tolerance but he continues to be limited by significant weakness. CIR recommended due to functional decline. Currently somnolent.   Review of Systems  Constitutional:  Negative for chills and fever.  HENT:  Negative for hearing loss.   Eyes:  Positive for blurred vision (needs glasses).  Respiratory:  Negative for cough and shortness of breath.   Cardiovascular:  Positive for leg swelling. Negative for chest pain and palpitations.  Gastrointestinal:  Positive for diarrhea. Negative for abdominal pain and nausea.  Genitourinary:  Positive for urgency.  Musculoskeletal:  Positive for myalgias.  Neurological:  Positive for focal weakness (RUE weakness --new since admission) and weakness. Negative for dizziness and headaches.  Psychiatric/Behavioral:  The patient is not nervous/anxious.     Past Medical History:  Diagnosis Date   CAD (coronary artery disease)    Cardiomyopathy ([place])    Colon cancer (HCC)    COPD (chronic obstructive pulmonary disease) (HCC)    OSA (obstructive sleep apnea)    Type 2 diabetes mellitus ([place])     Past Surgical History:  Procedure Laterality Date   COLON RESECTION     IR REMOVAL TUN CV CATH W/O FL  07/01/2021   PEG PLACEMENT  05/26/2021   TRACHEOSTOMY  05/26/2021    Family History  Problem Relation Age of Onset   Cancer Father    Diabetes Maternal Grandfather     Social History: Widowed. Family has been staying with him. Independent without AD. 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 He does not smoke, use smokeless tobacco or alcohol.    Allergies  Allergen Reactions    Statins Other (See Comments)    Made hands swell   Penicillins Nausea And Vomiting and Other (See Comments)    Other reaction(s): GI intolerance Other reaction(s): Nausea And Vomiting, Other (See Comments), Other (see comments) Other reaction(s): Other (See Comments) Other reaction(s): Nausea And Vomiting, Other (See Comments), Other (see comments) Other reaction(s): Nausea And Vomiting, Other (See Comments), Other (see comments)    Ropinirole Other (See Comments)    No medications prior to admission.   Home: Home Living Living Arrangements: Children Available Help at Discharge: Family, Available 24 hours/day Type of Home: House Home Access: Ramped entrance Home Layout: One level Bathroom Shower/Tub: Chiropodist: Handicapped height Bathroom Accessibility: Yes  Lives With: Family   Functional History: Prior Function Prior Level of Function : Independent/Modified Independent Mobility Comments: Walking wihtout device ADLs Comments: independent   Functional Status:  Mobility: Max assist for bed mobility Working on sitting at EOB     ADL: Needs assistance with meals due to BUE weakness Mod assist with UB dressing Total assist with LB dressing.    Cognition:    Height 5\' 7"  (1.702 m), weight 116.1 kg. Physical Exam Constitutional:      Appearance: He is morbidly obese. BMI 40.09    Comments: Plugged trach in place.   Cardiovascular:     Rate and Rhythm: Tachycardia present.     Comments: Resting heart rate 115- 120s during exam.  Pulmonary:     Breath sounds: No rhonchi.  Abdominal:     Comments: Obese with PEG in place.   Musculoskeletal:        General: Swelling present.     Comments: 2-3+ edema LLE. Dependent edema bilateral forearms R>L.   Skin:    General: Skin is warm and dry.     Comments: Multiple scabs on bilateral hands. LLE with bulking dressing and dried serous drainage.   Neurological:     Mental Status: He is alert and  oriented to person, place, and time.     Comments: Speech mildly dysarthric without dysphonia. Able to follow simple motor commands without difficulty. RUE weakness noted. Tends to keep LLE rotated outwards   Results for orders placed or performed during the hospital encounter of 06/10/21 (from the past 48 hour(s))  CBC     Status: Abnormal   Collection Time: 07/14/21  3:45 AM  Result Value Ref Range   WBC 3.9 (L) 4.0 - 10.5 K/uL   RBC 3.31 (L) 4.22 - 5.81 MIL/uL   Hemoglobin 9.5 (L) 13.0 - 17.0 g/dL   HCT 29.9 (L) 39.0 - 52.0 %   MCV 90.3 80.0 - 100.0 fL   MCH 28.7 26.0 - 34.0 pg   MCHC 31.8 30.0 - 36.0 g/dL   RDW 15.9 (H) 11.5 - 15.5 %   Platelets 162 150 - 400 K/uL   nRBC 0.8 (H) 0.0 - 0.2 %    Comment: Performed at [facility], [address], [place], [place] [address]  Renal function panel     Status: Abnormal   Collection Time: 07/14/21  3:45 AM  Result Value Ref Range  Sodium 141 135 - 145 mmol/L   Potassium 3.2 (L) 3.5 - 5.1 mmol/L   Chloride 103 98 - 111 mmol/L   CO2 26 22 - 32 mmol/L   Glucose, Bld 170 (H) 70 - 99 mg/dL    Comment: Glucose reference range applies only to samples taken after fasting for at least 8 hours.   BUN 44 (H) 8 - 23 mg/dL   Creatinine, Ser 1.22 0.61 - 1.24 mg/dL   Calcium 10.1 8.9 - 10.3 mg/dL   Phosphorus 3.8 2.5 - 4.6 mg/dL   Albumin 2.5 (L) 3.5 - 5.0 g/dL   GFR, Estimated >60 >60 mL/min    Comment: (NOTE) Calculated using the CKD-EPI Creatinine Equation (2021)    Anion gap 12 5 - 15    Comment: Performed at [facility] [address] [place], [place] [address]  Magnesium     Status: None   Collection Time: 07/14/21  3:45 AM  Result Value Ref Range   Magnesium 2.0 1.7 - 2.4 mg/dL    Comment: Performed at [facility] [address], [place], [place] [address]  Potassium     Status: None   Collection Time: 07/15/21  6:18 AM  Result Value Ref Range   Potassium 4.0 3.5 - 5.1 mmol/L    Comment: Performed at [facility], [place] [address], [place],  [address]   DG Chest Port 1 View  Result Date: 07/15/2021 CLINICAL DATA:  66 year old male with acute on chronic respiratory failure. EXAM: PORTABLE CHEST 1 VIEW COMPARISON:  Portable chest 07/06/2021 and earlier. FINDINGS: Portable AP semi upright view at 0630 hours. Satisfactory tracheostomy. Stable mild cardiomegaly. Other mediastinal contours are within normal limits. Improved lung volumes. Allowing for portable technique the lungs are clear. No pneumothorax or pleural effusion. No acute osseous abnormality identified. Paucity of bowel gas in the upper abdomen. IMPRESSION: Satisfactory tracheostomy.  No acute cardiopulmonary abnormality. Electronically Signed   By: Genevie Ann M.D.   On: 07/15/2021 07:21      Height 5\' 7"  (1.702 m), weight 116.1 kg.  Medical Problem List and Plan: 1. Functional deficits secondary to critical illness myopathy  -patient may shower  -ELOS/Goals: modA 20 days  Admit to CIR 2.  Antithrombotics: -DVT/anticoagulation:  Mechanical: Sequential compression devices, below knee Right lower extremity --high risk for DVTs due to body habitus and immobility. Will check dopplers in am.   -antiplatelet therapy: N/A 3. Pain: continue Oxycodone prn.  4. Mood: LCSW to follow for evaluation and support.   -antipsychotic agents:  Risperdal bid 5. Neuropsych: This patient is capable of making decisions on his own behalf. 6. Skin/Wound Care: Air mattress due to sacral decub  --continue vitamins and supplements to promote wound healing.  7. Fluids/Electrolytes/Nutrition:  Strict I/O as continues to show signs of overload/anasarca.  --low salt diet. Continue Juven to help with protein stores. 8. Strep pyogenes bacteremia/Necrotizing fascitis LLE s/p I & D: Continue daily dressing changes.   --maintain adequate nutritional status to help promote healing. 9. AECOPD: Respiratory status stable. Continue steriod taper 10 OSA/OHS?/VDRF:  Continue button plugging during the day and ATC at nights for humidification  --Started discussion that he will likely go home with trach.  11. H/o GIB: H/H improving. Continue PPI BID.   --no signs of bleeding. Will start low dose Lovenox if platelets stable.  12. Thrombocytopenia: Platelets continue to fluctuate.  --Will recheck CBC in am to decide on initiation of AC 13.  T2DM:  Check Hgb A1c in am. Will monitor BS ac/hs with SSI for elevated BS  Add 2U Semglee HS  --continue tube feeds at nights (likely contributing to diarrhea)  I have personally performed a face to face diagnostic evaluation, including, but not limited to relevant history and physical exam findings, of this patient and developed relevant assessment and plan.  Additionally, I have reviewed and concur with the physician assistant's documentation above.  Bary Leriche, PA-C    Izora Ribas, MD 07/15/2021

## 2021-07-16 ENCOUNTER — Inpatient Hospital Stay (HOSPITAL_COMMUNITY): Payer: Medicare Other

## 2021-07-16 DIAGNOSIS — M623 Immobility syndrome (paraplegic): Secondary | ICD-10-CM

## 2021-07-16 DIAGNOSIS — I480 Paroxysmal atrial fibrillation: Secondary | ICD-10-CM | POA: Diagnosis not present

## 2021-07-16 DIAGNOSIS — G7281 Critical illness myopathy: Secondary | ICD-10-CM | POA: Diagnosis not present

## 2021-07-16 DIAGNOSIS — I82409 Acute embolism and thrombosis of unspecified deep veins of unspecified lower extremity: Secondary | ICD-10-CM | POA: Diagnosis not present

## 2021-07-16 DIAGNOSIS — I5032 Chronic diastolic (congestive) heart failure: Secondary | ICD-10-CM | POA: Diagnosis not present

## 2021-07-16 LAB — COMPREHENSIVE METABOLIC PANEL
ALT: 26 U/L (ref 0–44)
AST: 17 U/L (ref 15–41)
Albumin: 2.6 g/dL — ABNORMAL LOW (ref 3.5–5.0)
Alkaline Phosphatase: 87 U/L (ref 38–126)
Anion gap: 12 (ref 5–15)
BUN: 40 mg/dL — ABNORMAL HIGH (ref 8–23)
CO2: 24 mmol/L (ref 22–32)
Calcium: 10.2 mg/dL (ref 8.9–10.3)
Chloride: 102 mmol/L (ref 98–111)
Creatinine, Ser: 1.2 mg/dL (ref 0.61–1.24)
GFR, Estimated: >60 mL/min (ref >60)
Glucose, Bld: 115 mg/dL — ABNORMAL HIGH (ref 70–99)
Potassium: 3.9 mmol/L (ref 3.5–5.1)
Sodium: 138 mmol/L (ref 135–145)
Total Bilirubin: 0.3 mg/dL (ref 0.3–1.2)
Total Protein: 5.7 g/dL — ABNORMAL LOW (ref 6.5–8.1)

## 2021-07-16 LAB — CBC WITH DIFFERENTIAL/PLATELET
Abs Immature Granulocytes: 0 10*3/uL (ref 0.00–0.07)
Basophils Absolute: 0 10*3/uL (ref 0.0–0.1)
Basophils Relative: 1 %
Eosinophils Absolute: 0 10*3/uL (ref 0.0–0.5)
Eosinophils Relative: 0 %
HCT: 28.6 % — ABNORMAL LOW (ref 39.0–52.0)
Hemoglobin: 9 g/dL — ABNORMAL LOW (ref 13.0–17.0)
Lymphocytes Relative: 18 %
Lymphs Abs: 0.5 10*3/uL — ABNORMAL LOW (ref 0.7–4.0)
MCH: 28.2 pg (ref 26.0–34.0)
MCHC: 31.5 g/dL (ref 30.0–36.0)
MCV: 89.7 fL (ref 80.0–100.0)
Monocytes Absolute: 0.3 10*3/uL (ref 0.1–1.0)
Monocytes Relative: 10 %
Neutro Abs: 2.1 10*3/uL (ref 1.7–7.7)
Neutrophils Relative %: 71 %
Platelets: 166 10*3/uL (ref 150–400)
RBC: 3.19 MIL/uL — ABNORMAL LOW (ref 4.22–5.81)
RDW: 16 % — ABNORMAL HIGH (ref 11.5–15.5)
WBC: 2.9 10*3/uL — ABNORMAL LOW (ref 4.0–10.5)
nRBC: 0 % (ref 0.0–0.2)
nRBC: 0 /100 WBC

## 2021-07-16 LAB — HEMOGLOBIN A1C
Hgb A1c MFr Bld: 5.9 % — ABNORMAL HIGH (ref 4.8–5.6)
Mean Plasma Glucose: 122.63 mg/dL

## 2021-07-16 LAB — GLUCOSE, CAPILLARY
Glucose-Capillary: 120 mg/dL — ABNORMAL HIGH (ref 70–99)
Glucose-Capillary: 207 mg/dL — ABNORMAL HIGH (ref 70–99)
Glucose-Capillary: 295 mg/dL — ABNORMAL HIGH (ref 70–99)

## 2021-07-16 MED ORDER — JUVEN PO PACK
1.0000 | PACK | Freq: Two times a day (BID) | ORAL | Status: DC
Start: 2021-07-17 — End: 2021-07-29
  Administered 2021-07-17 – 2021-07-28 (×14): 1
  Filled 2021-07-16 (×9): qty 1

## 2021-07-16 MED ORDER — ENOXAPARIN SODIUM 120 MG/0.8ML IJ SOSY
120.0000 mg | PREFILLED_SYRINGE | Freq: Two times a day (BID) | INTRAMUSCULAR | Status: DC
  Administered 2021-07-16 – 2021-07-27 (×22): 120 mg via SUBCUTANEOUS
  Filled 2021-07-16 (×24): qty 0.8

## 2021-07-16 MED ORDER — IOHEXOL 350 MG/ML SOLN
100.0000 mL | Freq: Once | INTRAVENOUS | Status: AC | PRN
  Administered 2021-07-16: 100 mL via INTRAVENOUS

## 2021-07-16 MED ORDER — ENSURE ENLIVE PO LIQD
237.0000 mL | Freq: Two times a day (BID) | ORAL | Status: DC
  Administered 2021-07-17 – 2021-07-27 (×14): 237 mL via ORAL

## 2021-07-16 MED ORDER — ENOXAPARIN SODIUM 120 MG/0.8ML IJ SOSY
120.0000 mg | PREFILLED_SYRINGE | Freq: Once | INTRAMUSCULAR | Status: AC
  Administered 2021-07-16: 120 mg via SUBCUTANEOUS
  Filled 2021-07-16: qty 0.8

## 2021-07-16 MED ORDER — AMIODARONE HCL 200 MG PO TABS
200.0000 mg | ORAL_TABLET | Freq: Every day | ORAL | Status: DC
  Administered 2021-07-17: 200 mg via ORAL
  Filled 2021-07-16: qty 1

## 2021-07-16 MED ORDER — ADULT MULTIVITAMIN W/MINERALS CH
1.0000 | ORAL_TABLET | Freq: Every day | ORAL | Status: DC
  Administered 2021-07-16 – 2021-07-28 (×11): 1 via ORAL
  Filled 2021-07-16 (×11): qty 1

## 2021-07-16 MED ORDER — COLLAGENASE 250 UNIT/GM EX OINT
TOPICAL_OINTMENT | Freq: Every day | CUTANEOUS | Status: DC
  Administered 2021-07-17: 1 via TOPICAL
  Filled 2021-07-16 (×2): qty 30

## 2021-07-16 MED ORDER — OSMOLITE 1.5 CAL PO LIQD
900.0000 mL | ORAL | Status: DC
  Administered 2021-07-16: 900 mL
  Filled 2021-07-16 (×2): qty 948
  Filled 2021-07-16: qty 1000
  Filled 2021-07-16: qty 948

## 2021-07-16 MED ORDER — IPRATROPIUM-ALBUTEROL 0.5-2.5 (3) MG/3ML IN SOLN: 3.0000 mL | RESPIRATORY_TRACT | Status: DC | PRN

## 2021-07-16 MED ORDER — FUROSEMIDE 10 MG/ML IJ SOLN
20.0000 mg | Freq: Once | INTRAMUSCULAR | Status: AC
  Administered 2021-07-16: 20 mg via INTRAVENOUS
  Filled 2021-07-16: qty 2

## 2021-07-16 NOTE — Evaluation (Signed)
Speech Language Pathology Assessment and Plan  Patient Details  Name: Tony Young MRN: [id number] Date of Birth: 08/10/1955  SLP Diagnosis: Dysphagia (further assessment warranted to determine cognitive status)  Rehab Potential: Excellent ELOS: 3.5 weeks   Today's Date: 07/16/2021 SLP Individual Time: 7564-3329 SLP Individual Time Calculation (min): 79 min  Hospital Problem: Principal Problem:   Critical illness myopathy  Past Medical History:  Past Medical History:  Diagnosis Date   CAD (coronary artery disease)    Cardiomyopathy ([place])    Colon cancer ([place])    COPD (chronic obstructive pulmonary disease) ([place])    OSA (obstructive sleep apnea)    Type 2 diabetes mellitus ([place])    Past Surgical History:  Past Surgical History:  Procedure Laterality Date   COLON RESECTION     IR REMOVAL TUN CV CATH W/O FL  07/01/2021   PEG PLACEMENT  05/26/2021   TRACHEOSTOMY  05/26/2021    Assessment / Plan / Recommendation Clinical Impression History of Present Illness: Tony Young is an 66 y.o. male with medical history significant for COPD, congestive heart failure, diabetes mellitus, coronary artery disease who was admitted to the acute facility on 05/13/2021 with complaints of worsening shortness of breath, fever, found to have necrotizing fasciitis of the left lower extremity. He was started on broad-spectrum antimicrobials. He underwent multiple debridements. Hospital course was complicated by acute kidney injury needing dialysis, respiratory failure, sepsis with shock, GI bleed, bacteremia.  He was seen by GI, wound care, cardiology, general surgery, nephrology, infectious disease.  He was initially treated with IV vancomycin.  However, due to the renal failure vancomycin was discontinued and he was started on doxycycline.  He also had respiratory failure due to sepsis and shock and was placed on mechanical ventilation and ended up with a tracheostomy.  He was  eventually stabilized and transferred to [facility] on 06/10/2021. Pt. has experienced renal recovery and no longer requires HD. He has also been weaned from the vent and is tolerating capping trials. MBS performed on 02/09 and he was started on D1, thins with full supervision for safety--No straws.  Patient seen for clinical swallow evaluation and presenting with mild-to-moderate clinical signs of oropharyngeal dysphagia. Pt obtained MBS on 2/09 with recommendations to initiate Dys 1 diet, thin liquids without straws. Pt appeared to have poor awareness of swallow changes/needs. Oral mechanism exam was remarkable for mild lingual weakness. Pt is edentulous and owns dentures at home but stated he does not wear them. Patient consumed dys 1 textures with timely and effective oral prep, complete oral clearance, and without overt s/sx of aspiration. Pt exhibited effective mastication and oral clearance of dys 2 textures and without overt s/sx of aspiration. Pt consumed single cup sips of thin liquid without overt s/sx of aspiration. Pt exhibited overt signs of aspiration with sequential sips of thin liquid by straw. Recommend diet advancement to dysphagia 2 textures, thin liquids, meds crushed in puree, and with the following precautions: NO straws, single sips, full supervision, and feeding assistance. Pt verbalized agreement. SLP to continue to address swallowing and advance diet as appropriate.   Patient has a Shiley #6 trach which was inserted 05/31/21 at previous venue. Pt is currently tolerating passy muir speaking valve during all waking hours with adequate vocal intensity, breath support, vocal quality, and spO2 maintained at 99-100% at the conversation level. Cough strength perceived as strong and effective. Pt does not appear to present with additional speech needs at this time per current  tolerance and independence with PMSV. Recommend cont during all waking hours and remove during  sleep.  SLP was unable to formally assess cognition due to time constraints. Through informal assessment, SLP noted patient was alert and oriented x4, demonstrated adequate selective attention within mildly distracting environment, and adequate emergent awareness of deficits. Question acute short-term memory deficits 2/2 difficulty recalling recent events. Further cognitive-linguistic evaluation is recommended to further assess. Patient would benefit from skilled SLP intervention to maximize swallow function for consumption of least restrictive diet, as well as further cognitive-linguistic evaluation in effort to enhance overall functional independence prior to discharge.    Skilled Therapeutic Interventions          Pt participated in speech/language/cognitive assessments as well as clinical swallow evaluation. Please see above.     SLP Assessment  Patient will need skilled Speech Lanaguage Pathology Services during CIR admission    Recommendations  Patient may use Passy-Muir Speech Valve: During all waking hours (remove during sleep) PMSV Supervision: Intermittent SLP Diet Recommendations: Dysphagia 2 (Fine chop);Thin Liquid Administration via: No straw;Cup Medication Administration: Crushed with puree Supervision: Full supervision/cueing for compensatory strategies;Staff to assist with self feeding Compensations: Slow rate;Small sips/bites Postural Changes and/or Swallow Maneuvers: Seated upright 90 degrees Oral Care Recommendations: Oral care BID Recommendations for Other Services: Neuropsych consult Patient destination: Home Follow up Recommendations: Other (comment) (TBD) Equipment Recommended: To be determined    SLP Frequency 3 to 5 out of 7 days   SLP Duration  SLP Intensity  SLP Treatment/Interventions 3.5 weeks  Minumum of 1-2 x/day, 30 to 90 minutes  Functional tasks;Patient/family education;Dysphagia/aspiration precaution training;Therapeutic Activities;Therapeutic  Exercise    Pain    Prior Functioning Cognitive/Linguistic Baseline: Information not available Type of Home: House  Lives With: Family Available Help at Discharge: Family;Available 24 hours/day  SLP Evaluation Cognition Overall Cognitive Status: Within Functional Limits for tasks assessed (limited assessment obtained, further assessment warranted) Arousal/Alertness: Awake/alert Orientation Level: Oriented X4 Year: 2023 Month: February Day of Week: Correct Attention: Selective Selective Attention: Appears intact Memory: Impaired Memory Impairment: Decreased recall of new information;Decreased short term memory Decreased Short Term Memory: Verbal basic Awareness: Appears intact Problem Solving: Appears intact Safety/Judgment: Appears intact  Comprehension Auditory Comprehension Overall Auditory Comprehension: Appears within functional limits for tasks assessed Expression Expression Primary Mode of Expression: Verbal Verbal Expression Overall Verbal Expression: Appears within functional limits for tasks assessed Written Expression Dominant Hand: Right Oral Motor Oral Motor/Sensory Function Overall Oral Motor/Sensory Function: Mild impairment Facial ROM: Within Functional Limits Facial Symmetry: Within Functional Limits Facial Strength: Within Functional Limits Facial Sensation: Within Functional Limits Lingual ROM: Within Functional Limits Lingual Symmetry: Within Functional Limits Lingual Strength: Reduced Lingual Sensation: Within Functional Limits Velum: Within Functional Limits Mandible: Within Functional Limits Motor Speech Overall Motor Speech: Appears within functional limits for tasks assessed Respiration: Within functional limits Phonation: Normal Resonance: Within functional limits Articulation: Within functional limitis Intelligibility: Intelligible Motor Planning: Witnin functional limits  Care Tool Care Tool Cognition Ability to hear (with  hearing aid or hearing appliances if normally used Ability to hear (with hearing aid or hearing appliances if normally used): 1. Minimal difficulty - difficulty in some environments (e.g. when person speaks softly or setting is noisy)   Expression of Ideas and Wants Expression of Ideas and Wants: 4. Without difficulty (complex and basic) - expresses complex messages without difficulty and with speech that is clear and easy to understand   Understanding Verbal and Non-Verbal Content Understanding Verbal and Non-Verbal Content: 3.  Usually understands - understands most conversations, but misses some part/intent of message. Requires cues at times to understand  Memory/Recall Ability Memory/Recall Ability : Current season;That he or she is in a hospital/hospital unit   PMSV Assessment  PMSV Trial Able to redirect subglottic air through upper airway: Yes Able to Attain Phonation: Yes Voice Quality: Normal Able to Expectorate Secretions: No attempts Level of Secretion Expectoration with PMSV: Not observed Breath Support for Phonation: Adequate Intelligibility: Intelligible SpO2 During Trial: 100 % Behavior: Alert;Cooperative;Expresses self well;Good eye contact  Bedside Swallowing Assessment General Date of Onset: 05/13/21 Previous Swallow Assessment: MBS on 2/09 Diet Prior to this Study: Dysphagia 1 (puree);Thin liquids Temperature Spikes Noted: No Respiratory Status: Trach Trach Size and Type: #6;With PMSV in place History of Recent Intubation: Yes Date extubated: 06/24/21 Behavior/Cognition: Alert;Cooperative;Pleasant mood Oral Cavity - Dentition: Edentulous Self-Feeding Abilities: Total assist Patient Positioning: Upright in bed Baseline Vocal Quality: Normal Volitional Cough: Strong Volitional Swallow: Able to elicit  Oral Care Assessment Does patient have any of the following "high(er) risk" factors?: Tracheostomy with trach collar 24 hrs./day Patient is HIGH RISK:  Non-ventilated: Order set for Adult Oral Care Protocol initiated - "High Risk Patients - Non-Ventilated" option selected  (see row information) Ice Chips Ice chips: Not tested Thin Liquid Thin Liquid: Impaired Presentation: Cup;Straw Pharyngeal  Phase Impairments: Suspected delayed Swallow;Cough - Immediate;Cough - Delayed;Other (comments) (cough with sequential straw sips only) Nectar Thick Nectar Thick Liquid: Not tested Honey Thick Honey Thick Liquid: Not tested Puree Puree: Within functional limits Presentation: Spoon Solid Solid: Impaired Oral Phase Functional Implications: Impaired mastication BSE Assessment Risk for Aspiration Impact on safety and function: Mild aspiration risk Other Related Risk Factors: Deconditioning  Short Term Goals: Week 1: SLP Short Term Goal 1 (Week 1): Patient will tolerate current diet with min A verbal cues for implementing safe swallowing precautions and strategies (e.g., no straws, single sips, small bites, slow rate). SLP Short Term Goal 2 (Week 1): Patient will consume dys 3 trials with effective mastication, minimal oral residuals, and without overt s/sx of aspiration with min A verbal cues to implement safe swallow precautions and strategies prior to diet advancement SLP Short Term Goal 3 (Week 1): Patient will participate in further cognitive-linguistic assessment  Refer to Care Plan for Long Term Goals  Recommendations for other services: Neuropsych  Discharge Criteria: Patient will be discharged from SLP if patient refuses treatment 3 consecutive times without medical reason, if treatment goals not met, if there is a change in medical status, if patient makes no progress towards goals or if patient is discharged from hospital.  The above assessment, treatment plan, treatment alternatives and goals were discussed and mutually agreed upon: by patient  Patty Sermons 07/16/2021, 6:45 PM

## 2021-07-16 NOTE — Progress Notes (Signed)
Patient placed on bedrest secondary to multiple DVTs. RLE from CFV down to PT and CFV LLE with some flow but concerns of iliac DVT. CT venogram ordered for work up of clot burden.  Discussed with Dr. Wilmon Pali almost 2 months ago with stable H/H. Will anticoagulate as has multiple risk factors. EKG repeated and he continues to have A fib w/flutter-->asymptomatic with resting HR upto 120's. Will consult cardiology for input as BP remains soft. Called daughter and updated both re: above results/work up. Both agreeable to initiate AC and want best tx.

## 2021-07-16 NOTE — Progress Notes (Signed)
Inpatient Rehabilitation  Patient information reviewed and entered into eRehab system by Gal Feldhaus M. Cienna Dumais, M.A., CCC/SLP, PPS Coordinator.  Information including medical coding, functional ability and quality indicators will be reviewed and updated through discharge.    

## 2021-07-16 NOTE — Progress Notes (Signed)
Copied note over into current chart  (was place in wrong account): Patient placed on bedrest secondary to multiple DVTs. RLE from CFV down to PT and CFV LLE with some flow but concerns of iliac DVT. CT venogram ordered for work up of clot burden.  Discussed with Dr. Wilmon Pali almost 2 months ago with stable H/H. Will anticoagulate as has multiple risk factors. EKG repeated and he continues to have A fib w/flutter-->asymptomatic with resting HR upto 120's. Will consult cardiology for input as BP remains soft. Called daughter and updated both re: above results/work up. Both agreeable to initiate [facility] and want best tx

## 2021-07-16 NOTE — Progress Notes (Addendum)
Occupational Therapy Session Note  Patient Details  Name: Tony Young MRN: [id number] Date of Birth: April 16, 1956  Today's Date: 07/17/2021 OT Individual Time: 1430-1530 OT Individual Time Calculation (min): 60 min   Short Term Goals: Week 1:  OT Short Term Goal 1 (Week 1): Patient will compelte UB bathing with Max A in supine. OT Short Term Goal 2 (Week 1): Patient will complete UB dressing wtih Max A in supine. OT Short Term Goal 3 (Week 1): Patient will maintain static sitting balance with Mod A seated EOB in prep for ADLs. OT Short Term Goal 4 (Week 1): Patient will complete sit to stand transfer with Max A +2 and LRAD.  Skilled Therapeutic Interventions/Progress Updates:    Pt greeted in bed with c/o buttocks pain, asking OT to adjust firmness of air mattress which was done to address pain. Due to bedside orders, tx session completed bedlevel. OT played meaningful music and guided pt through gentle exercises for UB/LB. Pt required active assist at times to flex the Rt shoulder. Noted Rt UE swelling and propped his arm up on pillows during session. Education provided on elevation and gentle mobility for edema control. Pt with very good participation during modified dance-based exercises. He took rest breaks as needed. Pt reported, "this is the most fun I've had since being in the hospital. I appreciate you." OT provided pt with B elbow protectors, size adjusted to accommodate his arms. OT donned the Rt one but left the Lt one for pts nurse due to pts IV bleeding from site. All needs within reach and 4 bedrails up of air mattress at session exit. Tx focus placed on gentle UE/LE mobility/strengthening for carryover during self care tasks and transfers.   Therapy Documentation Precautions:  Precautions Precautions: Fall Precaution Comments: trach, PEG, unable to read, fragile skin Restrictions Weight Bearing Restrictions: No Vital Signs: Therapy Vitals Pulse Rate: (!) 113 Resp:  18 Patient Position (if appropriate): Lying Oxygen Therapy SpO2: 98 % O2 Device: Room Air Pain Assessment Pain Scale: 0-10 Pain Score: 6  Pain Type: Acute pain Pain Location: Buttocks Pain Orientation: Medial Pain Descriptors / Indicators: Aching;Constant Pain Intervention(s): Medication (See eMAR);Repositioned ADL: ADL Eating: Maximal assistance Where Assessed-Eating: Bed level Grooming: Maximal assistance Where Assessed-Grooming: Bed level Upper Body Bathing: Maximal assistance Where Assessed-Upper Body Bathing: Bed level Lower Body Bathing: Dependent Where Assessed-Lower Body Bathing: Bed level Upper Body Dressing: Dependent Where Assessed-Upper Body Dressing: Bed level Lower Body Dressing: Dependent Where Assessed-Lower Body Dressing: Bed level Toileting: Dependent Where Assessed-Toileting: Bed level Toilet Transfer Method: Not assessed  Therapy/Group: Individual Therapy  Torri Langston A Masayoshi Couzens 07/17/2021, 3:56 PM

## 2021-07-16 NOTE — Progress Notes (Signed)
Notified Reesa Chew, [place] of patient's venous doppler results.   Yehuda Mao, LPN

## 2021-07-16 NOTE — Progress Notes (Signed)
PROGRESS NOTE   Subjective/Complaints:  Pt reports no SOB on TC O2- initially said no pain, however then said at [facility], they pulled on shoulder transferring him and hurts pretty bad- hard to use.  LBM today- Tolerates PMV except when sleeping.   Cannot see where in/outs documented- computer glitch?   ROS:  Pt denies SOB, abd pain, CP, N/V/C/D, and vision changes    Objective:   DG Chest Port 1 View  Result Date: 07/15/2021 CLINICAL DATA:  66 year old male with acute on chronic respiratory failure. EXAM: PORTABLE CHEST 1 VIEW COMPARISON:  Portable chest 07/06/2021 and earlier. FINDINGS: Portable AP semi upright view at 0630 hours. Satisfactory tracheostomy. Stable mild cardiomegaly. Other mediastinal contours are within normal limits. Improved lung volumes. Allowing for portable technique the lungs are clear. No pneumothorax or pleural effusion. No acute osseous abnormality identified. Paucity of bowel gas in the upper abdomen. IMPRESSION: Satisfactory tracheostomy.  No acute cardiopulmonary abnormality. Electronically Signed   By: Genevie Ann M.D.   On: 07/15/2021 07:21   Recent Labs    07/14/21 0345 07/16/21 0612  WBC 3.9* 2.9*  HGB 9.5* 9.0*  HCT 29.9* 28.6*  PLT 162 166   Recent Labs    07/14/21 0345 07/15/21 0618 07/16/21 0612  NA 141  --  138  K 3.2* 4.0 3.9  CL 103  --  102  CO2 26  --  24  GLUCOSE 170*  --  115*  BUN 44*  --  40*  CREATININE 1.22  --  1.20  CALCIUM 10.1  --  10.2    Intake/Output Summary (Last 24 hours) at 07/16/2021 1007 Last data filed at 07/16/2021 3143 Gross per 24 hour  Intake 240 ml  Output --  Net 240 ml        Physical Exam: Vital Signs Blood pressure 103/71, pulse 98, temperature 99 F (37.2 C), temperature source Oral, resp. rate 20, height 5\' 7"  (1.702 m), weight 116.1 kg, SpO2 97 %.    General: awake, alert, appropriate, initially asleep; but woke to tactile stimuli;  NAD HENT: conjugate gaze; oropharynx  alittle dry- TC in place- helped pt place PMV- voice hoarse, but understandable OO:[id number] tachycardic; no JVD Pulmonary: slightly coarse throughout- rare wheezes- rate rhonchi; no rales- decreased at bases GI: soft, NT, ND, (+)BS- protuberant; PEG in place Psychiatric: appropriate- interactive Neurological: Ox3 Musculoskeletal:   R shoulder (+) impingement vs partial RTC tear on R shoulder- Also TTP posteriorly.       General: Swelling present.     Comments: 2-3+ edema LLE. Dependent edema bilateral forearms R>L.   Skin:    General: Skin is warm and dry.     Comments: Multiple scabs on bilateral hands. LLE with bulking dressing and dried serous drainage.- a lot of ecchymoses in arms B/L    Neurological:     Mental Status: He is alert and oriented to person, place, and time.     Comments: Speech mildly dysarthric without dysphonia. Able to follow simple motor commands without difficulty. RUE weakness noted. Tends to keep LLE rotated outwards     Assessment/Plan: 1. Functional deficits which require 3+ hours per day of interdisciplinary therapy  in a comprehensive inpatient rehab setting. Physiatrist is providing close team supervision and 24 hour management of active medical problems listed below. Physiatrist and rehab team continue to assess barriers to discharge/monitor patient progress toward functional and medical goals  Care Tool:  Bathing    Body parts bathed by patient: Face   Body parts bathed by helper: Right arm, Left arm, Chest, Abdomen, Front perineal area, Buttocks, Right upper leg, Left upper leg, Right lower leg Body parts n/a: Left lower leg   Bathing assist Assist Level: Total Assistance - Patient < 25%     Upper Body Dressing/Undressing Upper body dressing   What is the patient wearing?: Pull over shirt    Upper body assist Assist Level: Total Assistance - Patient < 25%    Lower Body Dressing/Undressing Lower body  dressing      What is the patient wearing?: Pants, Underwear/pull up     Lower body assist Assist for lower body dressing: 2 Helpers     Toileting Toileting    Toileting assist Assist for toileting: Dependent - Patient 0%     Transfers Chair/bed transfer  Transfers assist           Locomotion Ambulation   Ambulation assist              Walk 10 feet activity   Assist           Walk 50 feet activity   Assist           Walk 150 feet activity   Assist           Walk 10 feet on uneven surface  activity   Assist           Wheelchair     Assist               Wheelchair 50 feet with 2 turns activity    Assist            Wheelchair 150 feet activity     Assist          Blood pressure 103/71, pulse 98, temperature 99 F (37.2 C), temperature source Oral, resp. rate 20, height 5\' 7"  (1.702 m), weight 116.1 kg, SpO2 97 %.  Medical Problem List and Plan: 1. Functional deficits secondary to critical illness myopathy             -patient may shower             -ELOS/Goals: modA 20 days             First day of evaluations- con't CIR- PT, OT and SLP 2.  Antithrombotics: -DVT/anticoagulation:  Mechanical: Sequential compression devices, below knee Right lower extremity --high risk for DVTs due to body habitus and immobility. Will check dopplers in am.              -antiplatelet therapy: N/A 3. Pain: continue Oxycodone prn. 2/16- main pain is R shoulder- con't regimen prn  4. Mood: LCSW to follow for evaluation and support.              -antipsychotic agents:  Risperdal bid 5. Neuropsych: This patient is capable of making decisions on his own behalf. 6. Skin/Wound Care: Air mattress due to sacral decub             --continue vitamins and supplements to promote wound healing.  7. Fluids/Electrolytes/Nutrition:  Strict I/O as continues to show signs of overload/anasarca.             --  low salt diet. Continue  Juven to help with protein stores.  2/16- albumin 2.6-  8. Strep pyogenes bacteremia/Necrotizing fascitis LLE s/p I & D: Continue daily dressing changes.              --maintain adequate nutritional status to help promote healing. 9. Acute  reps failure s/p trach- and COPD: Respiratory status stable. Continue steriod taper  2/16- con't PMV and SLP for swallowing/speech/phonation 10 OSA/OHS?/VDRF: Continue button plugging during the day and ATC at nights for humidification             --Started discussion that he will likely go home with trach.  11. H/o GIB: H/H improving. Continue PPI BID.              --no signs of bleeding. Will start low dose Lovenox if platelets stable.  12. Thrombocytopenia: Platelets continue to fluctuate.  --Will recheck CBC in am to decide on initiation of AC  2/16- Plts up to 166k 13.  T2DM:  Check Hgb A1c in am. Will monitor BS ac/hs with SSI for elevated BS             Add 2U Semglee HS             --continue tube feeds at nights (likely contributing to diarrhea)   2/16- need to see more than 1 value to determine what needs Insulin- wise- will monitor trend 14. R RTC injury- probably partial tear-   2/16- sustained in [facility]- will write note above HOB to remind staff to protect R shoulder.   I spent a total of  37  minutes on total care today- >50% coordination of care- due to d/w nursing and NT about R shoulder; as well as reps status. Moderate complexity decision making.   LOS: 1 days A FACE TO FACE EVALUATION WAS PERFORMED  Tony Young 07/16/2021, 10:07 AM

## 2021-07-16 NOTE — Progress Notes (Signed)
Took cap off patients trach and placed him on 21% ATC for night rest.

## 2021-07-16 NOTE — Evaluation (Signed)
Occupational Therapy Assessment and Plan  Patient Details  Name: Tony Young MRN: [id number] Date of Birth: Jan 11, 1956  OT Diagnosis: acute pain, cognitive deficits, muscular wasting and disuse atrophy, muscle weakness (generalized), other lymphedema, and pain in joint Rehab Potential: Rehab Potential (ACUTE ONLY): Fair ELOS: 3.5 weeks   Today's Date: 07/16/2021 OT Individual Time: 7026-3785 OT Individual Time Calculation (min): 74 min     Hospital Problem: Principal Problem:   Critical illness myopathy   Past Medical History:  Past Medical History:  Diagnosis Date   CAD (coronary artery disease)    Cardiomyopathy ([place])    Colon cancer ([place])    COPD (chronic obstructive pulmonary disease) ([place])    OSA (obstructive sleep apnea)    Type 2 diabetes mellitus ([place])    Past Surgical History:  Past Surgical History:  Procedure Laterality Date   COLON RESECTION     IR REMOVAL TUN CV CATH W/O FL  07/01/2021   PEG PLACEMENT  05/26/2021   TRACHEOSTOMY  05/26/2021    Assessment & Plan Clinical Impression: Tony Young is a 66 year old male with  history of COPD, OSA, CAD/CHF (non-compliance with lasix), T2DM, colon cancer s/p RSXN; who was originally admitted to [facility] on 05/13/21 with fever X few days, progressive SOB,tachypnea and hypoxia. He was treated with IV diuresis for fluid overload, started on steroids due to concerns of AECOPD and required CPAP. He developed A fib with RVR  in ED treated with diltiazem which was d/c due to hypotension with septic shock due to necrotizing fasciitis of LLE with strep pyogenes bacteremia. He was intubated due to respiratory failure, required pressors for BP support but went on to developed acute renal failure due to ATN with oliguria. He required multiple I and D of LLE 12/15, 12/16 and 12/24 with local care.  He required CRRT and transitioned to HD once stable. He was started on IV heparin but developed GIB 12/24 with EGD  revealing gastric ulcer and required multiple units PRBCs. Hospital course significant for NSTEMI, fluid overload, shocked liver with thrombocytopenia --DIC panel negative, encephalopathy,  VDRF requiring PEG/Trach on 12/27 and continued to be vent dependent with somnolence. IV antibiotics transitioned to doxycycline and flagyl thorough 06/20/21 per ID input.  He was discharged to [facility] 06/10/21  for further management.   While in [facility], he did develop SIRS/MDRO PNA and was treated with Zyvox X 7 days in addition to  solumedrol for AECOPD. He has had bouts of rapid A fib treated and was started on BB but resting heart rate remains 115-120 range without cardiac symptoms.  Modafinil and amantadine added to help manage tachycardia. His respiratory status has been improving and he was extubated to ATC by 01/25. He has had issues with overload but unable to tolerate aggressive diuresis due to recurrent AKI/Hypernatremia.  He is tolerating plugging during the day with ATC at nights and continues on steroid taper.  Hypernatremia has resolved and renal status is improving. MBS performed on 02/09 and he was started on D1, thins with full supervision for safety--No straws. Tube feeds changed to nights and lantus being titrated upwards for BS management. Mentation has improved with increase in activity tolerance but he continues to be limited by significant weakness. CIR recommended due to functional decline. Currently somnolent. Patient transferred to CIR on 07/15/2021 .    Patient currently requires total with basic self-care skills secondary to muscle weakness and muscle joint tightness, decreased cardiorespiratoy endurance and decreased oxygen support, and  decreased memory.  Prior to hospitalization, patient could complete ADLs/IADLs with independence.  Patient will benefit from skilled intervention to decrease level of assist with basic self-care skills and increase independence with basic self-care skills prior to  discharge home with care partner.  Anticipate patient will require 24 hour supervision and moderate physical assestance and follow up home health.  OT - End of Session Activity Tolerance: Tolerates 10 - 20 min activity with multiple rests Endurance Deficit: Yes Endurance Deficit Description: Quick to fatigue, poor activity tolerance. OT Assessment Rehab Potential (ACUTE ONLY): Fair OT Barriers to Discharge: Incontinence;Wound Care OT Patient demonstrates impairments in the following area(s): Balance;Cognition;Edema;Endurance;Motor;Safety;Skin Integrity OT Basic ADL's Functional Problem(s): Eating;Grooming;Bathing;Dressing;Toileting OT Transfers Functional Problem(s): Toilet OT Additional Impairment(s): Fuctional Use of Upper Extremity OT Plan OT Intensity: Minimum of 1-2 x/day, 45 to 90 minutes OT Frequency: 5 out of 7 days OT Duration/Estimated Length of Stay: 3.5 weeks OT Treatment/Interventions: Balance/vestibular training;Cognitive remediation/compensation;Community reintegration;Discharge planning;Disease mangement/prevention;DME/adaptive equipment instruction;Functional mobility training;Pain management;Patient/family education;Psychosocial support;Self Care/advanced ADL retraining;Skin care/wound managment;Splinting/orthotics;Therapeutic Activities;Therapeutic Exercise;UE/LE Strength taining/ROM;UE/LE Coordination activities;Wheelchair propulsion/positioning OT Self Feeding Anticipated Outcome(s): Supervision A OT Basic Self-Care Anticipated Outcome(s): Mod A OT Toileting Anticipated Outcome(s): Mod A OT Bathroom Transfers Anticipated Outcome(s): Mod A OT Recommendation Recommendations for Other Services: Therapeutic Recreation consult Therapeutic Recreation Interventions: Pet therapy;Kitchen group;Stress management Patient destination: Home Follow Up Recommendations: Home health OT Equipment Recommended: To be determined   OT Evaluation Precautions/Restrictions   Precautions Precautions: Fall Precaution Comments: trach, PEG Restrictions Weight Bearing Restrictions: No General Chart Reviewed: Yes Additional Pertinent History: PMHx significant for COPD, OSA, CAD/CHF, DMII, colon cancer s/p RSXN. Family/Caregiver Present: No Vital Signs Oxygen Therapy SpO2: 97 % O2 Device: Tracheostomy Collar O2 Flow Rate (L/min): 5 L/min FiO2 (%): 28 % Pain Pain Assessment Pain Scale: 0-10 Pain Score: 5  Pain Type: Acute pain Pain Location: Buttocks Home Living/Prior Functioning Home Living Family/patient expects to be discharged to:: Private residence Living Arrangements: Children Available Help at Discharge: Family, Available 24 hours/day Type of Home: House Home Access: Ramped entrance Home Layout: One level Bathroom Shower/Tub: Chiropodist: Standard  Lives With: Family IADL History Occupation: Retired Prior Function Level of Independence: Independent with basic ADLs, Independent with transfers, Independent with homemaking with wheelchair Driving: Yes Vision Baseline Vision/History: 1 Wears glasses Ability to See in Adequate Light: 1 Impaired Patient Visual Report: No change from baseline Vision Assessment?: No apparent visual deficits Perception  Perception: Within Functional Limits Praxis Praxis: Intact Cognition Overall Cognitive Status: Within Functional Limits for tasks assessed Arousal/Alertness: Awake/alert Orientation Level: Person;Place;Situation Person: Oriented Place: Oriented Situation: Oriented Year: 2023 Month: February Day of Week: Correct Immediate Memory Recall: Sock;Blue;Bed Memory Recall Sock: With Cue Memory Recall Blue: Without Cue Memory Recall Bed: Without Cue Sensation Sensation Light Touch: Appears Intact Coordination Gross Motor Movements are Fluid and Coordinated: No Fine Motor Movements are Fluid and Coordinated: No Coordination and Movement Description: Grossly impaired in  BUE Finger Nose Finger Test: Unable to assess 2/2 generalized weakness/debility Motor  Motor Motor: Within Functional Limits Motor - Skilled Clinical Observations: Generalized weakness/debility  Trunk/Postural Assessment  Cervical Assessment Cervical Assessment: Within Functional Limits Thoracic Assessment Thoracic Assessment: Exceptions to [facility] (Rounded shoulders) Lumbar Assessment Lumbar Assessment: Exceptions to [facility] (Posterior pelvic tilt) Postural Control Postural Control: Deficits on evaluation (Insufficient; absent)  Balance Balance Balance Assessed: Yes Static Sitting Balance Static Sitting - Balance Support: Bilateral upper extremity supported;Feet unsupported Extremity/Trunk Assessment RUE Assessment RUE Assessment: Exceptions to [facility] Passive Range of Motion (  PROM) Comments: PROM limited at shoullder joint. Hx of rotator cuff injury s/p repair. Active Range of Motion (AROM) Comments: AAROM WFL at elbow and wrist. Limited extension of digits 3-5 (contractures?). Greatly limited by generalized weakness/debility. General Strength Comments: MMT grossly 2-/5 LUE Assessment LUE Assessment: Exceptions to [facility] Passive Range of Motion (PROM) Comments: WFL Active Range of Motion (AROM) Comments: Limited at shoulder joint. WFL at elbow, wrist and digits. General Strength Comments: MMT 3+/5 grossly  Care Tool Care Tool Self Care Eating   Eating Assist Level: Maximal Assistance - Patient 25 - 49%    Oral Care    Oral Care Assist Level: Maximal assistance - Patient 25 - 49%    Bathing   Body parts bathed by patient: Face Body parts bathed by helper: Right arm;Left arm;Chest;Abdomen;Front perineal area;Buttocks;Right upper leg;Left upper leg;Right lower leg Body parts n/a: Left lower leg Assist Level: Total Assistance - Patient < 25%    Upper Body Dressing(including orthotics)   What is the patient wearing?: Pull over shirt   Assist Level: Total Assistance - Patient < 25%     Lower Body Dressing (excluding footwear)   What is the patient wearing?: Pants;Underwear/pull up Assist for lower body dressing: 2 Helpers    Putting on/Taking off footwear   What is the patient wearing?: Non-skid slipper socks Assist for footwear: Dependent - Patient 0%       Care Tool Toileting Toileting activity   Assist for toileting: Dependent - Patient 0%     Care Tool Bed Mobility Roll left and right activity   Roll left and right assist level: Total Assistance - Patient < 25%    Sit to lying activity Sit to lying activity did not occur: Safety/medical concerns (Requires +2 assist)      Lying to sitting on side of bed activity Lying to sitting on side of bed activity did not occur: the ability to move from lying on the back to sitting on the side of the bed with no back support.: Safety/medical concerns       Care Tool Transfers Sit to stand transfer Sit to stand activity did not occur: Safety/medical concerns      Chair/bed transfer Chair/bed transfer activity did not occur: Safety/medical concerns       Toilet transfer Toilet transfer activity did not occur: Safety/medical concerns       Care Tool Cognition  Expression of Ideas and Wants Expression of Ideas and Wants: 4. Without difficulty (complex and basic) - expresses complex messages without difficulty and with speech that is clear and easy to understand  Understanding Verbal and Non-Verbal Content Understanding Verbal and Non-Verbal Content: 3. Usually understands - understands most conversations, but misses some part/intent of message. Requires cues at times to understand   Memory/Recall Ability Memory/Recall Ability : Current season;That he or she is in a hospital/hospital unit   Refer to Care Plan for [place] 1 OT Short Term Goal 1 (Week 1): Patient will compelte UB bathing with Max A in supine. OT Short Term Goal 2 (Week 1): Patient will complete UB dressing wtih Max A  in supine. OT Short Term Goal 3 (Week 1): Patient will maintain static sitting balance with Mod A seated EOB in prep for ADLs. OT Short Term Goal 4 (Week 1): Patient will complete sit to stand transfer with Max A +2 and LRAD.  Recommendations for other services: Other: TBD    Skilled Therapeutic Intervention Patient met lying  supine in bed in agreement with OT treatment session. Facial grimacing with movement of RLE and RUE (Hx of rotator cuff injury s/p repair) but patient does not rate pain on scale. Education provided on purpose or rehab, role of OT, ELOS and goals. Patient expressed verbal understanding but would benefit from continued education. Patient noted to be saturated in urine. UB bathing/dressing at bed level with Max to Total A and LB bathing/dressing with Total A. Would benefit from +2 assist at time of next treatment session. Patient also requires Max/Total A for rolling in supine with use of chuck pad. Unable to progress beyond bed level without +2 assist. Session concluded with patient lying supine in bed with call bell within reach, bed alarm activated and all needs met.   ADL ADL Eating: Maximal assistance Where Assessed-Eating: Bed level Grooming: Maximal assistance Where Assessed-Grooming: Bed level Upper Body Bathing: Maximal assistance Where Assessed-Upper Body Bathing: Bed level Lower Body Bathing: Dependent Where Assessed-Lower Body Bathing: Bed level Upper Body Dressing: Dependent Where Assessed-Upper Body Dressing: Bed level Lower Body Dressing: Dependent Where Assessed-Lower Body Dressing: Bed level Toileting: Dependent Where Assessed-Toileting: Bed level Toilet Transfer Method: Not assessed Mobility  Bed Mobility Bed Mobility: Rolling Right;Rolling Left Rolling Right: Maximal Assistance - Patient 25-49% Rolling Left: Total Assistance - Patient < 25%   Discharge Criteria: Patient will be discharged from OT if patient refuses treatment 3 consecutive times  without medical reason, if treatment goals not met, if there is a change in medical status, if patient makes no progress towards goals or if patient is discharged from hospital.  The above assessment, treatment plan, treatment alternatives and goals were discussed and mutually agreed upon: by patient  Tony Young 07/16/2021, 11:39 AM

## 2021-07-16 NOTE — Progress Notes (Signed)
[facility] Individual Statement of Services  Patient Name:  Tony Young  Date:  07/16/2021  Welcome to the [facility].  Our goal is to provide you with an individualized program based on your diagnosis and situation, designed to meet your specific needs.  With this comprehensive rehabilitation program, you will be expected to participate in at least 3 hours of rehabilitation therapies Monday-Friday, with modified therapy programming on the weekends.  Your rehabilitation program will include the following services:  Physical Therapy (PT), Occupational Therapy (OT), Speech Therapy (ST), 24 hour per day rehabilitation nursing, Therapeutic Recreaction (TR), Care Coordinator, Rehabilitation Medicine, Nutrition Services, and Pharmacy Services  Weekly team conferences will be held on Tuesday to discuss your progress.  Your Inpatient Rehabilitation Care Coordinator will talk with you frequently to get your input and to update you on team discussions.  Team conferences with you and your family in attendance may also be held.  Expected length of stay: 3.5  Overall anticipated outcome: Mod Assist level  Depending on your progress and recovery, your program may change. Your Inpatient Rehabilitation Care Coordinator will coordinate services and will keep you informed of any changes. Your Inpatient Rehabilitation Care Coordinator's name and contact numbers are listed  below.  The following services may also be recommended but are not provided by the [facility] will be made to provide these services after discharge if needed.  Arrangements include referral to agencies that provide these services.  Your insurance has been verified to be:  [employer] Your primary doctor is:  Satira Sark  Pertinent information  will be shared with your doctor and your insurance company.  Inpatient Rehabilitation Care Coordinator:  Ovidio Kin, [place] or Emilia Beck  Information discussed with and copy given to patient by: Elease Hashimoto, 07/16/2021, 12:16 PM

## 2021-07-16 NOTE — Plan of Care (Signed)
°  Problem: RH Swallowing Goal: LTG Patient will consume least restrictive diet using compensatory strategies with assistance (SLP) Description: LTG:  Patient will consume least restrictive diet using compensatory strategies with assistance (SLP) Flowsheets (Taken 07/16/2021 1849) LTG: Pt Patient will consume least restrictive diet using compensatory strategies with assistance of (SLP): Supervision Goal: LTG Patient will participate in dysphagia therapy to increase swallow function with assistance (SLP) Description: LTG:  Patient will participate in dysphagia therapy to increase swallow function with assistance (SLP) Flowsheets (Taken 07/16/2021 1849) LTG: Pt will participate in dysphagia therapy to increase swallow function with assistance of (SLP): Supervision Goal: LTG Pt will demonstrate functional change in swallow as evidenced by bedside/clinical objective assessment (SLP) Description: LTG: Patient will demonstrate functional change in swallow as evidenced by bedside/clinical objective assessment (SLP) Flowsheets (Taken 07/16/2021 1849) LTG: Patient will demonstrate functional change in swallow as evidenced by bedside/clinical objective assessment: Oropharyngeal swallow

## 2021-07-16 NOTE — Progress Notes (Signed)
Lower extremity venous has been completed.   Preliminary results in CV Proc.  Results given to RN.  Jinny Blossom Lashanta Elbe 07/16/2021 10:53 AM

## 2021-07-16 NOTE — Progress Notes (Signed)
ANTICOAGULATION CONSULT NOTE - Initial Consult  Pharmacy Consult for Lovenox Indication: DVT  Allergies  Allergen Reactions   Statins Other (See Comments)    Made hands swell   Penicillins Nausea And Vomiting and Other (See Comments)    Other reaction(s): GI intolerance Other reaction(s): Nausea And Vomiting, Other (See Comments), Other (see comments) Other reaction(s): Other (See Comments) Other reaction(s): Nausea And Vomiting, Other (See Comments), Other (see comments) Other reaction(s): Nausea And Vomiting, Other (See Comments), Other (see comments)    Ropinirole Other (See Comments)    Patient Measurements: Height: 5\' 7"  (170.2 cm) Weight: 116.1 kg (255 lb 15.3 oz) IBW/kg (Calculated) : 66.1 Heparin Dosing Weight:   Vital Signs: Temp: 99 F (37.2 C) (02/16 0449) Temp Source: Oral (02/16 0449) BP: 103/71 (02/16 0455) Pulse Rate: 98 (02/16 0455)  Labs: Recent Labs    07/14/21 0345 07/16/21 0612  HGB 9.5* 9.0*  HCT 29.9* 28.6*  PLT 162 166  CREATININE 1.22 1.20    Estimated Creatinine Clearance: 74.7 mL/min (by C-G formula based on SCr of 1.2 mg/dL).   Medical History: Past Medical History:  Diagnosis Date   CAD (coronary artery disease)    Cardiomyopathy ([place])    Colon cancer ([facility])    COPD (chronic obstructive pulmonary disease) ([facility])    OSA (obstructive sleep apnea)    Type 2 diabetes mellitus ([facility])     Medications:  No medications prior to admission.   Scheduled:   (feeding supplement) PROSource Plus  30 mL Oral BID BM   amantadine  50 mg Oral Daily   amiodarone  200 mg Oral Daily   vitamin C  500 mg Oral BID   busPIRone  7.5 mg Oral BID   cholecalciferol  1,000 Units Oral Daily   enoxaparin (LOVENOX) injection  120 mg Subcutaneous Once   enoxaparin (LOVENOX) injection  120 mg Subcutaneous BID   feeding supplement (VITAL 1.5 CAL)  550 mL Per Tube Daily   free water  100 mL Per Tube Q6H   insulin aspart  0-5 Units Subcutaneous QHS   insulin  aspart  0-9 Units Subcutaneous TID WC   insulin glargine-yfgn  2 Units Subcutaneous QHS   iron polysaccharides  150 mg Oral QPC supper   latanoprost  1 drop Both Eyes QHS   levothyroxine  75 mcg Oral Q0600   loratadine  10 mg Oral Daily   melatonin  3 mg Oral QHS   metoprolol tartrate  50 mg Oral BID   multivitamin  1 tablet Oral QHS   pantoprazole  40 mg Oral BID AC   polycarbophil  625 mg Oral BID   predniSONE  10 mg Oral Q breakfast   pregabalin  50 mg Oral Daily   primidone  50 mg Oral Daily   risperiDONE  1 mg Oral BID   sertraline  100 mg Oral QHS   zinc sulfate  220 mg Oral Daily   Infusions:   Assessment: Pt with complicated medical hx who was recently a [facility] before [facility]. He was then admitted to [facility]. He had some recent hx of GIB. Dopplers showed DVT today. Starting Lovenox for anticoagulation.  Scr 1.2 Hgb 9 Plt wnl Goal of Therapy:  Anti-Xa level 0.6-1 units/ml 4hrs after LMWH dose given Monitor platelets by anticoagulation protocol: Yes   Plan:  Lovenox 120mg  SQ BID F/u with oral AC  Onnie Boer, PharmD, BCIDP, AAHIVP, CPP Infectious Disease Pharmacist 07/16/2021 12:14 PM

## 2021-07-16 NOTE — Progress Notes (Signed)
Physical Therapy Note  Patient Details  Name: Tony Young MRN: [id number] Date of Birth: June 23, 1955 Today's Date: 07/16/2021    Pt not seen today, on hold due to bilat DVTs. Will evaluate when medically able to participate.   Jerrilyn Cairo 07/16/2021, 3:55 PM
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
## 2021-07-16 NOTE — Progress Notes (Signed)
Initial Nutrition Assessment  DOCUMENTATION CODES:   Obesity unspecified  INTERVENTION:  - Provide Ensure Enlive po BID, each supplement provides 350 kcal and 20 grams of protein.  - Provide MVI daily   -1 packet Juven BID, each packet provides 95 calories, 2.5 grams of protein (collagen), and 9.8 grams of carbohydrate (3 grams sugar); also contains 7 grams of L-arginine and L-glutamine, 300 mg vitamin C, 15 mg vitamin E, 1.2 mcg vitamin B-12, 9.5 mg zinc, 200 mg calcium, and 1.5 g  Calcium Beta-hydroxy-Beta-methylbutyrate to support wound healing  - Initiate nocturnal tube feeding via PEG:  Osmolite 1.5 cal formula at rate of 75 ml/hr x 12 hours (6pm-6am)  - Nocturnal tube feed provides 1350 kcal, 56 gm protein, and 686 mL of free water. This meets 60% of estimated calorie needs and 50% of minimum estimated protein needs.    NUTRITION DIAGNOSIS:   Increased nutrient needs related to other (see comment) (critical illness myopathy) as evidenced by estimated needs.   GOAL:   Patient will meet greater than or equal to 90% of their needs   MONITOR:   PO intake, Supplement acceptance, Labs, Weight trends, TF tolerance, Skin  REASON FOR ASSESSMENT:   Consult Assessment of nutrition requirement/status, Calorie Count  ASSESSMENT:    Pt is a 66 y.o. male with medical history significant of COPD, OSA, CAD/CHF (non-compliance with lasix), colon cancer s/p RSXN, T2DM, who was initially admitted to [facility] on 05/13/2021 with complaints of worsening SOB, fever, tachypnea and hypoxia and found to have necrotizing fasciitis of the left lower extremity and underwent multiple debridements. Hospital course was complicated by AKI needing dialysis, respiratory failure, sepsis with shock, GI bleed, bacteremia. He had respiratory failure due to sepsis and shock and was placed on mechanical ventilation and tracheostomy. Pt was eventually stabilized and transferred to [facility] and  presented with fever after admission and developed SIRS/MDRO PNA. Once respiratory status improved and pt extubated, pt was admitted to CIR due to functional decline due to critical illness myopathy.   05/13/2021 - admitted to [facility] with fever, progressive SOB, tachypnea, and hypoxia 05/26/2021 - VDRF requiring PEG/Trach  06/10/21 - discharged to [facility] for further management  06/24/21 - Extubated to ATC  07/09/21 - MBS performed and pt was started on D1, thin liquids with full supervision for safety - no straws  Per meal documentation, pt consumed 70% of his breakfast and 80% of his lunch today.   Met with pt at bedside. Pt alert and oriented. Per pt, pt endorses a good appetite today. Pt reports that prior to his hospital admissions, that he always had a good appetite at home and denies any decreased or poor appetite. 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 Pt reports that he lives at home with his family and that prior to being in the hospital, he ambulated on his own at baseline and rode his motorcycle. Pt denies any nausea or constipation. Pt reports that he has been tolerating chewing/swallowing well since being on dysphagia 1 diet. Pt does report that they tried to give him green beans and he refused as they are not satisfying to him. Per pt, pt reports that his UBW is in the 200s but he is unsure of the specific number.   Discussed with pt the importance of adequate PO intake to maintain energy/strength, for  healing and to meet nutritional needs. Discussed with pt nutritional supplements and pt agreeable to trying chocolate-flavored Ensure. Discussed the importance of vitamins and minerals and pt agreeable to MVI supplementation. Currently, nocturnal tube feeds are ordered. 48-hour calorie count ordered by MD. Dietetic intern/RD to follow up tomorrow  for Day 1 results.   Weight history reviewed.  Admit wt: 116.1 kg (255.95#) on 07/15/21  Per weight history, weight trending up from 07/14/21.   Labs reviewed and include:  BUN: 40 ^ Hemoglobin: 9.0 (low)   Medications reviewed and include:  Prosource Plus BID, Vitamin C, Vitamin D3, Vital 1.5 550 mL (75m/hr over 10 hours)  Novolog SSI TID, Semglee, Niferex, Rena-Vit, zinc-sulfate   NUTRITION - FOCUSED PHYSICAL EXAM:  Flowsheet Row Most Recent Value  Orbital Region Mild depletion  Upper Arm Region Mild depletion  Thoracic and Lumbar Region No depletion  Buccal Region No depletion  Temple Region No depletion  Clavicle Bone Region No depletion  Clavicle and Acromion Bone Region No depletion  Scapular Bone Region Mild depletion  Dorsal Hand No depletion  Patellar Region No depletion  Anterior Thigh Region No depletion  Posterior Calf Region Unable to assess  Edema (RD Assessment) None  Hair Reviewed  Eyes Reviewed  Mouth Reviewed  Skin Reviewed  Nails Reviewed       Diet Order:   Diet Order             DIET DYS 2 Room service appropriate? Yes; Fluid consistency: Thin  Diet effective now                   EDUCATION NEEDS:   Education needs have been addressed  Skin:  Skin Assessment: Skin Integrity Issues: Skin Integrity Issues:: Incisions Incisions: dehisced; non pressure wound pretibial left wound to LLE  Last BM:  2/15; type 7  Height:   Ht Readings from Last 1 Encounters:  07/15/21 _0  (1.702 m)    Weight:   Wt Readings from Last 1 Encounters:  07/15/21 116.1 kg    Ideal Body Weight:  67.2 kg  BMI:  Body mass index is 40.09 kg/m.  Estimated Nutritional Needs:   Kcal:  2250 - 2450  Protein:  110 - 125 grams  Fluid:  >/= 2.2 L    TMaryruth Hancock Dietetic Intern 07/16/2021 4:03 PM

## 2021-07-16 NOTE — Consult Note (Signed)
[facility] Nurse Consult Note: Reason for Consult:Necrotizing fasciitis to left lower extremity.  S/P debridement.  States another hospital in [place] suggested amputation.  Has been at [facility].  Unstageable pressure injury to sacrum and bilateral upper gluteal. Wound type: infectious to leg and pressure to sacrum.   Pressure Injury POA: Yes Measurement:27 cm x 33 cm x 1 cm to left lower leg.  Circumferential tissue loss.  Surgical debridement   Sacrum and gluteal wound:  4 cm x 22 cm eschar  will recommend PT to evaluate for hydrotherapy debridement.  Wound bed:90% Ruddy red with 10% scattered black devitalized tissue  Drainage (amount, consistency, odor) moderate serosanguinous  no odor.  Periwound: Dry scabbed lesions to left foot.  Dressing procedure/placement/frequency: Bedside RN to perform:  Cleanse left leg wounds with NS and pat dry. Apply Aquacel Ag ([facility] # [id number]) (Will require 4 sheets each dressing change) Cover with 4x4 gauze and wrap with kerlix and tape.  CHange every other day.    Cleanse sacral wound with NS and pat dry.  Apply Santyl to wound bed. COver with NS moist gauze and secure with silicone foam dressing.  Have requested PT to evaluate for hydrotherapy.   Will not follow at this time.  Please re-consult if needed.  Domenic Moras MSN, RN, FNP-BC [facility] Wound, Ostomy, Continence Nurse Pager [phone]

## 2021-07-16 NOTE — Consult Note (Addendum)
Cardiology Consultation:   Patient ID: Tony Young MRN: [id number]; DOB: 1955/08/09  Admit date: 07/15/2021 Date of Consult: 07/16/2021  PCP:  Rosaria Ferries, MD   [place] Providers Cardiologist:  None        Patient Profile:   Tony Young is a 66 y.o. male with a hx of COPD, OSA, CAD, CHF, T2 DM, Colon cancer s/p rsxn who is being seen 07/16/2021 for the evaluation of atrial fibrillation at the request of Dr. Dagoberto Ligas.  History of Present Illness:   Tony Young is a patient with above medical history. Patient is followed by [facility] cardiology. Per chart review, patient has a known history of CAD. Most recent ischemic evaluation was completed in 2017. Patient underwent LHC on 07/24/2015 with Distal Lt Main-30%, LAD-prox 30%, LCx-prox long (90%, OM1-55%, RCA,-prox 40%,-Mid 70%, PLV-80%, RM-90%. Managed medically. Later, patient had an echocardiogram on 12/04/2019 that showed LVEF 55-60%, no regional wall motion abnormalities, grade II diastolic dysfunction.   Patient was recently admitted to [facility] on 05/13/2021 with fever, progressive SOB, tachypnea, and hypoxia. Patient was found to have septic shock due to necrotizing fasciitis of the LLE with strep pyogenes. While there, he developed a fib with RVR. Patient went into respiratory failure and required intubation, pressors for BP support. He went on to develop acute renal failure due to ATN with oliguria. Patient underwent multiple I and D's of the LLE (on 12/15, 12/16, and 12/24). Later developed NSTEMI and was on IV heparin until the patient developed a GI bleed on 12/24. EGD revealed gastric ulcer. Patient received multiple units of PRBCs. In addition, the hospital course was also complicated by shocked liver with thrombocytopenia, encephalopathy, VDRF requiring PEG/Trache on 12/27. [facility]  While in the [facility], patient developed SIRS/MDRO PNA  and acute exacerbation of COPD. Patient had bouts of rapid afib and was started on a BB. His respiratory status has been improving and he was extubated to ATC by 01/25. Trache remains in place, and the primary team uses button plugging during the day and ATC at nights for humidification. Patient has had issues with volume overload, but he has difficulty tolerating aggressive diuresis due to his AKI as well as hypernatremia. Patient was admitted to the physical medicine and rehabilitation service on 2/15.   Since being admitted to the PMR service, the patient was placed on bed rest due to concerns of DVT. Lower extremity doppler ultrasound on 2/16 showed findings consistent with extensive DVTs in the right and left lower extremities. Labs this AM showed Na 138, K 3.9, creatinine 1.2, albumin 2.6, hemoglobin 9 (stable), hematocrit 28.6, WBC 2.9. EKG showed atrial fibrillation with HR 101. There were concerns that patient would not be able to tolerate anticoagulation, but his hemoglobin is currently stable and platelets are up to 166k this AM. On lovenox 120 mg injections BID.   On interview, patient denies chest pain, palpitations, dizziness, difficulty breathing, dizziness. Patient is not on true telemetry, but bedside monitor showed that his HR was between 100-110.    Past Medical History:  Diagnosis Date   CAD (coronary artery disease)    Cardiomyopathy ([place])    Colon cancer (HCC)    COPD (chronic obstructive pulmonary disease) (HCC)    OSA (obstructive sleep apnea)    Type 2 diabetes mellitus ([place])     Past Surgical History:  Procedure Laterality Date   COLON RESECTION     IR REMOVAL TUN  CV CATH W/O FL  07/01/2021   PEG PLACEMENT  05/26/2021   TRACHEOSTOMY  05/26/2021     Home Medications:  Prior to Admission medications   Not on File    Inpatient Medications: Scheduled Meds:  (feeding supplement) PROSource Plus  30 mL Oral BID BM   amantadine  50 mg Oral Daily   amiodarone  200  mg Oral Daily   vitamin C  500 mg Oral BID   busPIRone  7.5 mg Oral BID   cholecalciferol  1,000 Units Oral Daily   collagenase   Topical Daily   enoxaparin (LOVENOX) injection  120 mg Subcutaneous BID   feeding supplement (VITAL 1.5 CAL)  550 mL Per Tube Daily   free water  100 mL Per Tube Q6H   insulin aspart  0-5 Units Subcutaneous QHS   insulin aspart  0-9 Units Subcutaneous TID WC   insulin glargine-yfgn  2 Units Subcutaneous QHS   iron polysaccharides  150 mg Oral QPC supper   latanoprost  1 drop Both Eyes QHS   levothyroxine  75 mcg Oral Q0600   loratadine  10 mg Oral Daily   melatonin  3 mg Oral QHS   metoprolol tartrate  50 mg Oral BID   multivitamin  1 tablet Oral QHS   pantoprazole  40 mg Oral BID AC   polycarbophil  625 mg Oral BID   predniSONE  10 mg Oral Q breakfast   pregabalin  50 mg Oral Daily   primidone  50 mg Oral Daily   risperiDONE  1 mg Oral BID   sertraline  100 mg Oral QHS   zinc sulfate  220 mg Oral Daily   Continuous Infusions:  PRN Meds: acetaminophen, alum & mag hydroxide-simeth, bisacodyl, diphenhydrAMINE, guaiFENesin-dextromethorphan, ipratropium-albuterol, naphazoline-glycerin, oxyCODONE, polyethylene glycol, prochlorperazine **OR** prochlorperazine **OR** prochlorperazine, sodium phosphate, traZODone  Allergies:    Allergies  Allergen Reactions   Statins Other (See Comments)    Made hands swell   Penicillins Nausea And Vomiting and Other (See Comments)    Other reaction(s): GI intolerance Other reaction(s): Nausea And Vomiting, Other (See Comments), Other (see comments) Other reaction(s): Other (See Comments) Other reaction(s): Nausea And Vomiting, Other (See Comments), Other (see comments) Other reaction(s): Nausea And Vomiting, Other (See Comments), Other (see comments)    Ropinirole Other (See Comments)    Social History:   Social History   Socioeconomic History   Marital status: Unknown    Spouse name: Not on file   Number of  children: Not on file   Years of education: Not on file   Highest education level: Not on file  Occupational History   Not on file  Tobacco Use   Smoking status: Never   Smokeless tobacco: Never  Vaping Use   Vaping Use: Never used  Substance and Sexual Activity   Alcohol use: Never   Drug use: Never   Sexual activity: Not Currently  Other Topics Concern   Not on file  Social History Narrative   Not on file   Social Determinants of Health   Financial Resource Strain: Not on file  Food Insecurity: Not on file  Transportation Needs: Not on file  Physical Activity: Not on file  Stress: Not on file  Social Connections: Not on file  Intimate Partner Violence: Not on file    Family History:    Family History  Problem Relation Age of Onset   Cancer Father    Diabetes Maternal Grandfather  ROS:  Please see the history of present illness.   All other ROS reviewed and negative.     Physical Exam/Data:   Vitals:   07/16/21 0449 07/16/21 0455 07/16/21 0827 07/16/21 1510  BP: 103/71 103/71  113/72  Pulse: 96 98  (!) 104  Resp: 18 20  14   Temp: 99 F (37.2 C)   (!) 97.4 F (36.3 C)  TempSrc: Oral   Oral  SpO2: 100% 100% 97% 98%  Weight:      Height:        Intake/Output Summary (Last 24 hours) at 07/16/2021 1542 Last data filed at 07/16/2021 1338 Gross per 24 hour  Intake 477 ml  Output --  Net 477 ml   Last 3 Weights 07/15/2021 07/14/2021  Weight (lbs) 255 lb 15.3 oz 249 lb  Weight (kg) 116.1 kg 112.946 kg     Body mass index is 40.09 kg/m.  General:  Well nourished, well developed, in no acute distress HEENT: normal Neck: Trach in place Vascular: Radial pulses 2+ bilaterally Cardiac:  normal S1, S2; irregular rate and rhythm; no murmur  Lungs:  coarse breath sounds, rare wheezes  Abd: soft, nontender, no hepatomegaly  Ext: 3+ edema in BLE  Musculoskeletal:  No deformities, sequential compression device on RLE  Skin: warm and dry, multiple scabs on  both hands. LLE wrapped in dressing with dried serous drainage. Multiple bruises on amrs   Neuro:  CNs 2-12 intact, speech mildly dysarthric, able to follow commands.   Psych:  Normal affect   EKG:  The EKG was personally reviewed and demonstrates: atrial fibrillation, HR 101     Laboratory Data:  High Sensitivity Troponin:  No results for input(s): TROPONINIHS in the last 720 hours.   Chemistry Recent Labs  Lab 07/12/21 0310 07/14/21 0345 07/15/21 0618 07/16/21 0612  NA 142 141  --  138  K 3.6 3.2* 4.0 3.9  CL 106 103  --  102  CO2 25 26  --  24  GLUCOSE 76 170*  --  115*  BUN 49* 44*  --  40*  CREATININE 1.20 1.22  --  1.20  CALCIUM 10.1 10.1  --  10.2  MG 2.1 2.0  --   --   GFRNONAA >60 >60  --  >60  ANIONGAP 11 12  --  12    Recent Labs  Lab 07/12/21 0310 07/14/21 0345 07/16/21 0612  PROT  --   --  5.7*  ALBUMIN 2.6* 2.5* 2.6*  AST  --   --  17  ALT  --   --  26  ALKPHOS  --   --  87  BILITOT  --   --  0.3   Lipids No results for input(s): CHOL, TRIG, HDL, LABVLDL, LDLCALC, CHOLHDL in the last 168 hours.  Hematology Recent Labs  Lab 07/12/21 0310 07/14/21 0345 07/16/21 0612  WBC 3.4* 3.9* 2.9*  RBC 3.19* 3.31* 3.19*  HGB 9.0* 9.5* 9.0*  HCT 29.1* 29.9* 28.6*  MCV 91.2 90.3 89.7  MCH 28.2 28.7 28.2  MCHC 30.9 31.8 31.5  RDW 15.9* 15.9* 16.0*  PLT 148* 162 166   Thyroid No results for input(s): TSH, FREET4 in the last 168 hours.  BNPNo results for input(s): BNP, PROBNP in the last 168 hours.  DDimer No results for input(s): DDIMER in the last 168 hours.   Radiology/Studies:  DG Chest Port 1 View  Result Date: 07/15/2021 CLINICAL DATA:  66 year old male with acute on chronic  respiratory failure. EXAM: PORTABLE CHEST 1 VIEW COMPARISON:  Portable chest 07/06/2021 and earlier. FINDINGS: Portable AP semi upright view at 0630 hours. Satisfactory tracheostomy. Stable mild cardiomegaly. Other mediastinal contours are within normal limits. Improved lung  volumes. Allowing for portable technique the lungs are clear. No pneumothorax or pleural effusion. No acute osseous abnormality identified. Paucity of bowel gas in the upper abdomen. IMPRESSION: Satisfactory tracheostomy.  No acute cardiopulmonary abnormality. Electronically Signed   By: Genevie Ann M.D.   On: 07/15/2021 07:21   VAS [place] LOWER EXTREMITY VENOUS (DVT)  Result Date: 07/16/2021  Lower Venous DVT Study Patient Name:  Tony Young [facility]  Date of Exam:   07/16/2021 Medical Rec #: [id number]               Accession #:    [id number] Date of Birth: 1955/12/26               Patient Gender: M Patient Age:   61 years Exam Location:  [facility] Procedure:      VAS [place] LOWER EXTREMITY VENOUS (DVT) Referring Phys: PAMELA LOVE --------------------------------------------------------------------------------  Indications: Immobility.  Limitations: Body habitus, poor ultrasound/tissue interface and left calf bandages. Performing Technologist: Archie Patten RVS  Examination Guidelines: A complete evaluation includes B-mode imaging, spectral Doppler, color Doppler, and power Doppler as needed of all accessible portions of each vessel. Bilateral testing is considered an integral part of a complete examination. Limited examinations for reoccurring indications may be performed as noted. The reflux portion of the exam is performed with the patient in reverse Trendelenburg.  +---------+---------------+---------+-----------+----------+-------------------+  RIGHT     Compressibility Phasicity Spontaneity Properties Thrombus Aging       +---------+---------------+---------+-----------+----------+-------------------+  CFV       None            No        No                     Age Indeterminate    +---------+---------------+---------+-----------+----------+-------------------+  SFJ       None                                             Age Indeterminate     +---------+---------------+---------+-----------+----------+-------------------+  FV Prox   None                                             Age Indeterminate    +---------+---------------+---------+-----------+----------+-------------------+  FV Mid    None                                             Age Indeterminate    +---------+---------------+---------+-----------+----------+-------------------+  FV Distal None                                             Age Indeterminate    +---------+---------------+---------+-----------+----------+-------------------+  PFV       None  Age Indeterminate    +---------+---------------+---------+-----------+----------+-------------------+  POP       None            No        No                                          +---------+---------------+---------+-----------+----------+-------------------+  PTV       None                                                                  +---------+---------------+---------+-----------+----------+-------------------+  PERO                                                       Not well visualized  +---------+---------------+---------+-----------+----------+-------------------+  EIV                                                        patent with color                                                                doppler              +---------+---------------+---------+-----------+----------+-------------------+   +---------+---------------+---------+-----------+----------+-------------------+  LEFT      Compressibility Phasicity Spontaneity Properties Thrombus Aging       +---------+---------------+---------+-----------+----------+-------------------+  CFV       None            Yes       Yes                    Age Indeterminate    +---------+---------------+---------+-----------+----------+-------------------+  SFJ       None                                             Age Indeterminate     +---------+---------------+---------+-----------+----------+-------------------+  FV Prox   Full                                                                  +---------+---------------+---------+-----------+----------+-------------------+  FV Mid    Full            Yes       Yes                                         +---------+---------------+---------+-----------+----------+-------------------+  FV Distal Full            Yes       Yes                                         +---------+---------------+---------+-----------+----------+-------------------+  PFV       Full                                                                  +---------+---------------+---------+-----------+----------+-------------------+  POP       Full            Yes       Yes                                         +---------+---------------+---------+-----------+----------+-------------------+  PTV                                                        not visualized due                                                               to bandages          +---------+---------------+---------+-----------+----------+-------------------+  PERO                                                       not visualized due                                                               to bandages          +---------+---------------+---------+-----------+----------+-------------------+  EIV                                                        patent with color                                                                doppler              +---------+---------------+---------+-----------+----------+-------------------+  Summary: RIGHT: - Findings consistent with age indeterminate deep vein thrombosis involving the right common femoral vein, SF junction, right femoral vein, right proximal profunda vein, right popliteal vein, and right posterior tibial veins. - No cystic structure found in the popliteal fossa.  LEFT: - Findings consistent  with age indeterminate deep vein thrombosis involving the left common femoral vein, and SF junction. - No cystic structure found in the popliteal fossa.  *See table(s) above for measurements and observations. Electronically signed by Jamelle Haring on 07/16/2021 at 2:29:30 PM.    Final      Assessment and Plan:   Paroxysmal Atrial Fibrillation: CHADS-VASc 4 (Age, HTN, DM, CHF)   - Monitor at beside showed HR 100-110. Recorded vital signs show HR 89-104. As patient is asymptomatic, goal could be less than 110 BPM  - On metoprolol 50 mg daily  - Was started on amiodarone this AM by primary team. 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 Could possibly attempt gentle diuresis now that kidney function has improved  - will give one dose of 20 lasix IV today to see how he tolerages  - swelling could also be due to low total protein, low albumin   History of GI bleed  Thrombocytopenia (shocked liver in December 2022)  - Platelets 166k this AM  - Hemoglobin and hematocrit improving, Hemoglobin 9.0 this AM - Continue to monitor with the addition of AC   Otherwise per Primary  - Sacral wound  - Necrotizing fasciitis/ strep pyogenes bacteremia  - COPD, Acute respiratory failure s/p trach - T2DM  - Right shoulder pain   Risk Assessment/Risk Scores:    New York Heart Association (NYHA) Functional Class NYHA Class II  CHA2DS2-VASc Score = 4  This indicates a 4.8% annual risk of stroke. The patient's score is based upon: CHF History: 1 HTN History: 1 Diabetes  History: 1 Stroke History: 0 Vascular Disease History: 0 Age Score: 1 Gender Score: 0         For questions or updates, please contact [place] Please consult [email or web address] for contact info under   Patient seen and examined, note reviewed with the signed Advanced Practice Provider. I personally reviewed laboratory data, imaging studies and relevant notes. I independently examined the patient and formulated the important aspects of the plan. I have personally discussed the plan with the patient and/or family. Comments or changes to the note/plan are indicated below.  Continue amiodarone, as well as low-dose beta-blocker.  He is on Lovenox therapeutic continue this. Clinically he does have some fluid based on my assessment.  Will benefit from cautious diuretics We will continue to follow with you.   Berniece Salines DO, MS [facility] Attending Cardiologist [place]  [address] [place], [place] [address] [phone] Website: [email or web address]    Signed, Margie Billet, PA-C  07/16/2021 3:42 PM

## 2021-07-17 DIAGNOSIS — I82409 Acute embolism and thrombosis of unspecified deep veins of unspecified lower extremity: Secondary | ICD-10-CM | POA: Diagnosis not present

## 2021-07-17 DIAGNOSIS — G7281 Critical illness myopathy: Secondary | ICD-10-CM | POA: Diagnosis not present

## 2021-07-17 DIAGNOSIS — I5032 Chronic diastolic (congestive) heart failure: Secondary | ICD-10-CM | POA: Diagnosis not present

## 2021-07-17 DIAGNOSIS — I480 Paroxysmal atrial fibrillation: Secondary | ICD-10-CM | POA: Diagnosis not present

## 2021-07-17 LAB — GLUCOSE, CAPILLARY
Glucose-Capillary: 365 mg/dL — ABNORMAL HIGH (ref 70–99)
Glucose-Capillary: 251 mg/dL — ABNORMAL HIGH (ref 70–99)
Glucose-Capillary: 247 mg/dL — ABNORMAL HIGH (ref 70–99)
Glucose-Capillary: 298 mg/dL — ABNORMAL HIGH (ref 70–99)

## 2021-07-17 MED ORDER — SILVER NITRATE-POT NITRATE 75-25 % EX MISC
1.0000 "application " | CUTANEOUS | Status: DC | PRN
  Filled 2021-07-17: qty 1

## 2021-07-17 MED ORDER — AMIODARONE HCL 200 MG PO TABS
200.0000 mg | ORAL_TABLET | Freq: Two times a day (BID) | ORAL | Status: DC
  Administered 2021-07-17 – 2021-07-29 (×24): 200 mg via ORAL
  Filled 2021-07-17 (×24): qty 1

## 2021-07-17 MED ORDER — INSULIN GLARGINE-YFGN 100 UNIT/ML ~~LOC~~ SOLN
6.0000 [IU] | Freq: Every day | SUBCUTANEOUS | Status: DC
  Administered 2021-07-17: 6 [IU] via SUBCUTANEOUS
  Filled 2021-07-17 (×2): qty 0.06

## 2021-07-17 MED ORDER — OSMOLITE 1.5 CAL PO LIQD
900.0000 mL | ORAL | Status: DC
  Administered 2021-07-17 – 2021-07-18 (×2): 900 mL
  Filled 2021-07-17 (×2): qty 948

## 2021-07-17 NOTE — Progress Notes (Signed)
PROGRESS NOTE   Subjective/Complaints:  Pt reports LBM overnight- Started Lovenox yesterday for extensive DVT's- no signs of GI bleed as of yet.  Nursing reports LLE has dressings from knee to ankle.    ROS:  Pt denies SOB, abd pain, CP, N/V/C/D, and vision changes    Objective:   CT VENOGRAM ABD/PEL  Result Date: 07/16/2021 CLINICAL DATA:  Deep venous thrombosis (DVT) EXAM: CT VENOGRAM ABDOMEN AND PELVIS WITH CONTRAST TECHNIQUE: Multidetector CT imaging of the abdomen and pelvis was performed using the standard protocol following bolus administration of intravenous contrast. RADIATION DOSE REDUCTION: This exam was performed according to the departmental dose-optimization program which includes automated exposure control, adjustment of the mA and/or kV according to patient size and/or use of iterative reconstruction technique. CONTRAST:  121mL OMNIPAQUE IOHEXOL 350 MG/ML SOLN COMPARISON:  Chest XR, 07/15/2021. FINDINGS: Suboptimal evaluation, secondary to poor contrast opacification such that nonocclusive thrombus could missed. Lower chest: Small volume RIGHT pleural effusion. Cardiomegaly with multivessel coronary atherosclerosis. Hepatobiliary: Mild nodularity of liver contour, suspicious for cirrhosis. No focal liver lesion is seen. No gallstones, gallbladder wall thickening, or biliary dilatation. Pancreas: No pancreatic ductal dilatation or surrounding inflammatory changes. Spleen: Normal in size without focal abnormality. Perihilar accessory spleen. Adrenals/Urinary Tract: Adrenal glands are unremarkable. Kidneys are normal, without renal calculi, focal lesion, or hydronephrosis. Mild distention of the urinary bladder. Anterior non dependent intraluminal gas, within urinary bladder, correlate for recent catheterization. Stomach/Bowel: Gastrostomy, well-positioned at the antral stomach. Stomach is otherwise within normal limits.  Nonobstructed small bowel. Appendix is not definitively visualized. Nondilated colon. Postsurgical changes of LEFT colectomy with intact-appearing anastomosis. No evidence of bowel wall thickening, distention, or inflammatory changes. Vascular/Lymphatic: Aortic atherosclerosis without aneurysmal dilation. No enlarged abdominal or pelvic lymph nodes. *Venous expansion and appearance of filling defect involving the RIGHT SFV, profunda vein and CFV. *No evidence of thrombus extension into the RIGHT EIV. *Suspect additional venous expansion and filling defect involving the LEFT CFV. See key image. *No evidence of filling defect within the IVC or visualized portions of the pelvic veins. Reproductive: Prostate is unremarkable. Other: Gynecomastia. Anterior abdominal wall contusions, likely injection sites. No abdominal wall hernia. No abdominopelvic ascites. Musculoskeletal: No acute osseous findings. IMPRESSION: Suboptimal evaluation, within these constraints; 1. RIGHT lower extremity DVT, involving the RIGHT femoral vein, profunda and CFV. 2. Suspected additional LEFT lower extremity DVT, involving the LEFT CFV. 3. No CTV evidence of thrombus extension into the pelvic veins. These results will be called to the ordering clinician or representative by the Radiologist Assistant, and communication documented in the PACS or [employer]. Michaelle Birks, MD [facility] Radiology Electronically Signed   By: Michaelle Birks M.D.   On: 07/16/2021 16:17   VAS [place] LOWER EXTREMITY VENOUS (DVT)  Result Date: 07/16/2021  Lower Venous DVT Study Patient Name:  Tony Young [facility]  Date of Exam:   07/16/2021 Medical Rec #: [id number]               Accession #:    [id number] Date of Birth: 1955/07/22               Patient Gender: M Patient  Age:   66 years Exam Location:  [facility] Procedure:      VAS [place] LOWER EXTREMITY VENOUS (DVT) Referring Phys: PAMELA LOVE  --------------------------------------------------------------------------------  Indications: Immobility.  Limitations: Body habitus, poor ultrasound/tissue interface and left calf bandages. Performing Technologist: Archie Patten RVS  Examination Guidelines: A complete evaluation includes B-mode imaging, spectral Doppler, color Doppler, and power Doppler as needed of all accessible portions of each vessel. Bilateral testing is considered an integral part of a complete examination. Limited examinations for reoccurring indications may be performed as noted. The reflux portion of the exam is performed with the patient in reverse Trendelenburg.  +---------+---------------+---------+-----------+----------+-------------------+  RIGHT     Compressibility Phasicity Spontaneity Properties Thrombus Aging       +---------+---------------+---------+-----------+----------+-------------------+  CFV       None            No        No                     Age Indeterminate    +---------+---------------+---------+-----------+----------+-------------------+  SFJ       None                                             Age Indeterminate    +---------+---------------+---------+-----------+----------+-------------------+  FV Prox   None                                             Age Indeterminate    +---------+---------------+---------+-----------+----------+-------------------+  FV Mid    None                                             Age Indeterminate    +---------+---------------+---------+-----------+----------+-------------------+  FV Distal None                                             Age Indeterminate    +---------+---------------+---------+-----------+----------+-------------------+  PFV       None                                             Age Indeterminate    +---------+---------------+---------+-----------+----------+-------------------+  POP       None            No        No                                           +---------+---------------+---------+-----------+----------+-------------------+  PTV       None                                                                  +---------+---------------+---------+-----------+----------+-------------------+  PERO                                                       Not well visualized  +---------+---------------+---------+-----------+----------+-------------------+  EIV                                                        patent with color                                                                doppler              +---------+---------------+---------+-----------+----------+-------------------+   +---------+---------------+---------+-----------+----------+-------------------+  LEFT      Compressibility Phasicity Spontaneity Properties Thrombus Aging       +---------+---------------+---------+-----------+----------+-------------------+  CFV       None            Yes       Yes                    Age Indeterminate    +---------+---------------+---------+-----------+----------+-------------------+  SFJ       None                                             Age Indeterminate    +---------+---------------+---------+-----------+----------+-------------------+  FV Prox   Full                                                                  +---------+---------------+---------+-----------+----------+-------------------+  FV Mid    Full            Yes       Yes                                         +---------+---------------+---------+-----------+----------+-------------------+  FV Distal Full            Yes       Yes                                         +---------+---------------+---------+-----------+----------+-------------------+  PFV       Full                                                                  +---------+---------------+---------+-----------+----------+-------------------+  POP       Full            Yes       Yes                                          +---------+---------------+---------+-----------+----------+-------------------+  PTV                                                        not visualized due                                                               to bandages          +---------+---------------+---------+-----------+----------+-------------------+  PERO                                                       not visualized due                                                               to bandages          +---------+---------------+---------+-----------+----------+-------------------+  EIV                                                        patent with color                                                                doppler              +---------+---------------+---------+-----------+----------+-------------------+     Summary: RIGHT: - Findings consistent with age indeterminate deep vein thrombosis involving the right common femoral vein, SF junction, right femoral vein, right proximal profunda vein, right popliteal vein, and right posterior tibial veins. - No cystic structure found in the popliteal fossa.  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 - No cystic structure found in the popliteal fossa.  *See table(s) above for measurements and observations. Electronically signed by Jamelle Haring on 07/16/2021 at 2:29:30 PM.    Final    Recent Labs    07/16/21 0612  WBC 2.9*  HGB 9.0*  HCT 28.6*  PLT 166   Recent Labs  07/15/21 0618 07/16/21 0612  NA  --  138  K 4.0 3.9  CL  --  102  CO2  --  24  GLUCOSE  --  115*  BUN  --  40*  CREATININE  --  1.20  CALCIUM  --  10.2    Intake/Output Summary (Last 24 hours) at 07/17/2021 1445 Last data filed at 07/17/2021 1300 Gross per 24 hour  Intake 1281.25 ml  Output 300 ml  Net 981.25 ml     Pressure Injury 07/15/21 Buttocks Left Unstageable - Full thickness tissue loss in which  the base of the injury is covered by slough (yellow, tan, gray, green or brown) and/or eschar (tan, brown or black) in the wound bed. (Active)  07/15/21 1520  Location: Buttocks  Location Orientation: Left  Staging: Unstageable - Full thickness tissue loss in which the base of the injury is covered by slough (yellow, tan, gray, green or brown) and/or eschar (tan, brown or black) in the wound bed.  Wound Description (Comments):   Present on Admission: Yes    Physical Exam: Vital Signs Blood pressure 119/72, pulse (!) 101, temperature 98.3 F (36.8 C), resp. rate 18, height 5\' 7"  (1.702 m), weight 116.1 kg, SpO2 98 %.     General: awake, alert, appropriate, initially asleep, but woke to stimuli; NAD HENT: conjugate gaze; oropharynx moist- trach in place- helped place PMV- mild sputum from trach;  CV: borderline tachycardic rate but in afib; no JVD Pulmonary: slightly coarse throughout- with a few wheezes, but good air movement GI: soft, NT, ND, (+)BS- protuberant Psychiatric: appropriate Neurological: Ox3-  Musculoskeletal:   R shoulder (+) impingement vs partial RTC tear on R shoulder- Also TTP posteriorly.       General: Swelling present.     Comments: 2-3+ edema LLE. Dependent edema bilateral forearms R>L.   Skin:    General: Skin is warm and dry.     Comments: Multiple scabs on bilateral hands. LLE with bulking dressing and dried serous drainage-  .- a lot of ecchymoses in arms B/L    Neurological:     Mental Status: He is alert and oriented to person, place, and time.     Comments: Speech mildly dysarthric without dysphonia. Able to follow simple motor commands without difficulty. RUE weakness noted. Tends to keep LLE rotated outwards     Assessment/Plan: 1. Functional deficits which require 3+ hours per day of interdisciplinary therapy in a comprehensive inpatient rehab setting. Physiatrist is providing close team supervision and 24 hour management of active medical  problems listed below. Physiatrist and rehab team continue to assess barriers to discharge/monitor patient progress toward functional and medical goals  Care Tool:  Bathing    Body parts bathed by patient: Face   Body parts bathed by helper: Right arm, Left arm, Chest, Abdomen, Front perineal area, Buttocks, Right upper leg, Left upper leg, Right lower leg Body parts n/a: Left lower leg   Bathing assist Assist Level: Total Assistance - Patient < 25%     Upper Body Dressing/Undressing Upper body dressing   What is the patient wearing?: Pull over shirt    Upper body assist Assist Level: Total Assistance - Patient < 25%    Lower Body Dressing/Undressing Lower body dressing      What is the patient wearing?: Pants, Underwear/pull up     Lower body assist Assist for lower body dressing: 2 Helpers     Toileting Toileting    Toileting assist Assist for  toileting: Dependent - Patient 0%     Transfers Chair/bed transfer  Transfers assist  Chair/bed transfer activity did not occur: Safety/medical concerns        Locomotion Ambulation   Ambulation assist   Ambulation activity did not occur: Safety/medical concerns          Walk 10 feet activity   Assist  Walk 10 feet activity did not occur: Safety/medical concerns        Walk 50 feet activity   Assist Walk 50 feet with 2 turns activity did not occur: Safety/medical concerns         Walk 150 feet activity   Assist Walk 150 feet activity did not occur: Safety/medical concerns         Walk 10 feet on uneven surface  activity   Assist Walk 10 feet on uneven surfaces activity did not occur: Safety/medical concerns         Wheelchair     Assist Is the patient using a wheelchair?: Yes Type of Wheelchair: Manual Wheelchair activity did not occur: Safety/medical concerns         Wheelchair 50 feet with 2 turns activity    Assist    Wheelchair 50 feet with 2 turns activity  did not occur: Safety/medical concerns       Wheelchair 150 feet activity     Assist  Wheelchair 150 feet activity did not occur: Safety/medical concerns       Blood pressure 119/72, pulse (!) 101, temperature 98.3 F (36.8 C), resp. rate 18, height 5\' 7"  (1.702 m), weight 116.1 kg, SpO2 98 %.  Medical Problem List and Plan: 1. Functional deficits secondary to critical illness myopathy             -patient may shower             -ELOS/Goals: modA 20 days            pt didn't receive therapy yesterday due to extensive DVTs- con't eval for PT/OT and SLP today  2.  Antithrombotics: -DVT/anticoagulation:  Mechanical: Sequential compression devices, below knee Right lower extremity --high risk for DVTs due to body habitus and immobility. Will check dopplers in am. 2/17- pt has extensive DVTs in RLE and some in LLE- as above- placed on tx dose lovenox- will check CBC tomorrow due to risk of new GI bleed              -antiplatelet therapy: N/A 3. Pain: continue Oxycodone prn. 2/16- main pain is R shoulder- con't regimen prn  4. Mood: LCSW to follow for evaluation and support.              -antipsychotic agents:  Risperdal bid 5. Neuropsych: This patient is capable of making decisions on his own behalf. 6. Skin/Wound Care: Air mattress due to sacral decub and necrotizing fasciitis LLE-              --continue vitamins and supplements to promote wound healing.  7. Fluids/Electrolytes/Nutrition:  Strict I/O as continues to show signs of overload/anasarca.             --low salt diet. Continue Juven to help with protein stores.  2/16- albumin 2.6-  8. Strep pyogenes bacteremia/Necrotizing fascitis LLE s/p I & D: Continue daily dressing changes.              --maintain adequate nutritional status to help promote healing. 9. Acute  reps failure s/p trach- and COPD: Respiratory status stable. Continue  steriod taper  2/16- con't PMV and SLP for swallowing/speech/phonation 10 OSA/OHS?/VDRF:  Continue button plugging during the day and ATC at nights for humidification             --Started discussion that he will likely go home with trach.  11. H/o GIB: H/H improving. Continue PPI BID.              --no signs of bleeding. Will start low dose Lovenox if platelets stable.   2/17- had to start tx dose lovenox- per pharmacy, cannot start DOAC due to medication interactions- will need warfarin 12. Thrombocytopenia: Platelets continue to fluctuate.  --Will recheck CBC in am to decide on initiation of AC  2/16- Plts up to 166k 13.  T2DM:  Check Hgb A1c in am. Will monitor BS ac/hs with SSI for elevated BS             Add 2U Semglee HS             --continue tube feeds at nights (likely contributing to diarrhea)   2/16- need to see more than 1 value to determine what needs Insulin- wise- will monitor trend  2/17- Pt CBGs are running [id number]- will increase Semglee to 6 units nightly- from 2 units and monitor for trend 14. R RTC injury- probably partial tear-   2/16- sustained in [facility]- will write note above HOB to remind staff to protect R shoulder.  15. Extensive B/L DVTs-   2/17- will check CBC this weekend, since recent hx of GI bleed. Don't want pt having PE due to trach and resp failure requiring trach.   I spent a total of   35 minutes on total care today- >50% coordination of care- due to d/w PA and nursing about LLE  LOS: 2 days A FACE TO FACE EVALUATION WAS PERFORMED  Malcolm Quast 07/17/2021, 2:45 PM

## 2021-07-17 NOTE — Progress Notes (Signed)
Speech Language Pathology Daily Session Note  Patient Details  Name: Tony Young MRN: [id number] Date of Birth: November 14, 1955  Today's Date: 07/17/2021 SLP Individual Time: 7591-6384 SLP Individual Time Calculation (min): 45 min  Short Term Goals: Week 1: SLP Short Term Goal 1 (Week 1): Patient will tolerate current diet with min A verbal cues for implementing safe swallowing precautions and strategies (e.g., no straws, single sips, small bites, slow rate). SLP Short Term Goal 2 (Week 1): Patient will consume dys 3 trials with effective mastication, minimal oral residuals, and without overt s/sx of aspiration with min A verbal cues to implement safe swallow precautions and strategies prior to diet advancement SLP Short Term Goal 3 (Week 1): Patient will participate in further cognitive-linguistic assessment  Skilled Therapeutic Interventions: Skilled ST treatment focused on swallowing goals. Patient received supine in bed and requiring frequent adjustments for comfort requiring total A. Pt with poor frustration tolerance today and exhibited some resistance toward encouragement to engage in self feeding w/ liquids using hand over hand assist, as pt had frequent requests for water. Pt has been total A for feeding. Pt made strong requests for using a straw for pt's ease, however he exhibited increased s/sx of aspiration with straw sips yesterday therefore SLP educated on no straw precautions. Pt in agreement following education. Pt exhibited frustration toward anterior spillage from cup and there was increased concern for uncontrolled bolus volume if patient continues to require total A for feeding. Pt participated in trial using 10cc Provale cup with handle to increase grasp for self feeding max hand-over-hand A with L hand, which ultimately better controlled bolus rate/volume. SLP educated on purpose of Provale cup. Patient expressed "I like that a whole lot" and agreeable to use moving forward.  Pt exhibited no overt s/sx of aspiration with consumption of thin liquids via regular cup and from Provale cup. SLP had goal to complete further cognitive-linguistic evaluation, however unable to complete due to time constraints. Patient was left in bed with alarm activated and immediate needs within reach at end of session. Continue per current plan of care.      Pain Pain Assessment Pain Scale: 0-10 Pain Score: 8  Pain Type: Acute pain Pain Location: Buttocks Pain Descriptors / Indicators: Aching Pain Intervention(s): Repositioned;Emotional support  Therapy/Group: Individual Therapy  Patty Sermons 07/17/2021, 12:42 PM

## 2021-07-17 NOTE — Progress Notes (Signed)
Progress Note  Patient Name: Tony Young Date of Encounter: 07/17/2021  Primary Cardiologist: None   Subjective   Patient seen and examined at his bedside.  He was lying bed when I arrived.  No complaints at this time.  Inpatient Medications    Scheduled Meds:  amantadine  50 mg Oral Daily   amiodarone  200 mg Oral Daily   vitamin C  500 mg Oral BID   busPIRone  7.5 mg Oral BID   cholecalciferol  1,000 Units Oral Daily   collagenase   Topical Daily   enoxaparin (LOVENOX) injection  120 mg Subcutaneous BID   feeding supplement  237 mL Oral BID BM   feeding supplement (OSMOLITE 1.5 CAL)  900 mL Per Tube Q24H   free water  100 mL Per Tube Q6H   insulin aspart  0-5 Units Subcutaneous QHS   insulin aspart  0-9 Units Subcutaneous TID WC   insulin glargine-yfgn  2 Units Subcutaneous QHS   iron polysaccharides  150 mg Oral QPC supper   latanoprost  1 drop Both Eyes QHS   levothyroxine  75 mcg Oral Q0600   loratadine  10 mg Oral Daily   melatonin  3 mg Oral QHS   metoprolol tartrate  50 mg Oral BID   multivitamin with minerals  1 tablet Oral Daily   nutrition supplement (JUVEN)  1 packet Per Tube BID BM   pantoprazole  40 mg Oral BID AC   polycarbophil  625 mg Oral BID   predniSONE  10 mg Oral Q breakfast   pregabalin  50 mg Oral Daily   primidone  50 mg Oral Daily   risperiDONE  1 mg Oral BID   sertraline  100 mg Oral QHS   zinc sulfate  220 mg Oral Daily   Continuous Infusions:  PRN Meds: acetaminophen, alum & mag hydroxide-simeth, bisacodyl, diphenhydrAMINE, guaiFENesin-dextromethorphan, ipratropium-albuterol, naphazoline-glycerin, oxyCODONE, polyethylene glycol, prochlorperazine **OR** prochlorperazine **OR** prochlorperazine, sodium phosphate, traZODone   Vital Signs    Vitals:   07/16/21 2324 07/16/21 2325 07/17/21 0621 07/17/21 0837  BP:   119/72   Pulse: (!) 113 99 97 (!) 116  Resp: 18  14 18   Temp:   98.3 F (36.8 C)   TempSrc:      SpO2: 98%  97% 98% 97%  Weight:      Height:        Intake/Output Summary (Last 24 hours) at 07/17/2021 1109 Last data filed at 07/17/2021 0017 Gross per 24 hour  Intake 1298.25 ml  Output 300 ml  Net 998.25 ml   Filed Weights   07/15/21 1700  Weight: 116.1 kg    Telemetry     - Personally Reviewed  ECG    None today  - Personally Reviewed  Physical Exam   General: Comfortable,  Head: Atraumatic, normal size  Eyes: PEERLA, EOMI  Neck: Supple, normal JVD Cardiac: Normal S1, S2; RRR; no murmurs, rubs, or gallops Lungs: Clear to auscultation bilaterally Abd: Soft, nontender, no hepatomegaly  Ext: warm, no edema Musculoskeletal: No deformities, BUE and BLE strength normal and equal Skin: Warm and dry, no rashes   Neuro: Alert and oriented to person, place, time, and situation, CNII-XII grossly intact, no focal deficits  Psych: Normal mood and affect   Labs    Chemistry Recent Labs  Lab 07/12/21 0310 07/14/21 0345 07/15/21 0618 07/16/21 0612  NA 142 141  --  138  K 3.6 3.2* 4.0 3.9  CL 106 103  --  102  CO2 25 26  --  24  GLUCOSE 76 170*  --  115*  BUN 49* 44*  --  40*  CREATININE 1.20 1.22  --  1.20  CALCIUM 10.1 10.1  --  10.2  PROT  --   --   --  5.7*  ALBUMIN 2.6* 2.5*  --  2.6*  AST  --   --   --  17  ALT  --   --   --  26  ALKPHOS  --   --   --  87  BILITOT  --   --   --  0.3  GFRNONAA >60 >60  --  >60  ANIONGAP 11 12  --  12     Hematology Recent Labs  Lab 07/12/21 0310 07/14/21 0345 07/16/21 0612  WBC 3.4* 3.9* 2.9*  RBC 3.19* 3.31* 3.19*  HGB 9.0* 9.5* 9.0*  HCT 29.1* 29.9* 28.6*  MCV 91.2 90.3 89.7  MCH 28.2 28.7 28.2  MCHC 30.9 31.8 31.5  RDW 15.9* 15.9* 16.0*  PLT 148* 162 166    Cardiac EnzymesNo results for input(s): TROPONINI in the last 168 hours. No results for input(s): TROPIPOC in the last 168 hours.   BNPNo results for input(s): BNP, PROBNP in the last 168 hours.   DDimer No results for input(s): DDIMER in the last 168 hours.    Radiology    CT VENOGRAM ABD/PEL  Result Date: 07/16/2021 CLINICAL DATA:  Deep venous thrombosis (DVT) EXAM: CT VENOGRAM ABDOMEN AND PELVIS WITH CONTRAST TECHNIQUE: Multidetector CT imaging of the abdomen and pelvis was performed using the standard protocol following bolus administration of intravenous contrast. RADIATION DOSE REDUCTION: This exam was performed according to the departmental dose-optimization program which includes automated exposure control, adjustment of the mA and/or kV according to patient size and/or use of iterative reconstruction technique. CONTRAST:  157mL OMNIPAQUE IOHEXOL 350 MG/ML SOLN COMPARISON:  Chest XR, 07/15/2021. FINDINGS: Suboptimal evaluation, secondary to poor contrast opacification such that nonocclusive thrombus could missed. Lower chest: Small volume RIGHT pleural effusion. Cardiomegaly with multivessel coronary atherosclerosis. Hepatobiliary: Mild nodularity of liver contour, suspicious for cirrhosis. No focal liver lesion is seen. No gallstones, gallbladder wall thickening, or biliary dilatation. Pancreas: No pancreatic ductal dilatation or surrounding inflammatory changes. Spleen: Normal in size without focal abnormality. Perihilar accessory spleen. Adrenals/Urinary Tract: Adrenal glands are unremarkable. Kidneys are normal, without renal calculi, focal lesion, or hydronephrosis. Mild distention of the urinary bladder. Anterior non dependent intraluminal gas, within urinary bladder, correlate for recent catheterization. Stomach/Bowel: Gastrostomy, well-positioned at the antral stomach. Stomach is otherwise within normal limits. Nonobstructed small bowel. Appendix is not definitively visualized. Nondilated colon. Postsurgical changes of LEFT colectomy with intact-appearing anastomosis. No evidence of bowel wall thickening, distention, or inflammatory changes. Vascular/Lymphatic: Aortic atherosclerosis without aneurysmal dilation. No enlarged abdominal or pelvic lymph  nodes. *Venous expansion and appearance of filling defect involving the RIGHT SFV, profunda vein and CFV. *No evidence of thrombus extension into the RIGHT EIV. *Suspect additional venous expansion and filling defect involving the LEFT CFV. See key image. *No evidence of filling defect within the IVC or visualized portions of the pelvic veins. Reproductive: Prostate is unremarkable. Other: Gynecomastia. Anterior abdominal wall contusions, likely injection sites. No abdominal wall hernia. No abdominopelvic ascites. Musculoskeletal: No acute osseous findings. IMPRESSION: Suboptimal evaluation, within these constraints; 1. RIGHT lower extremity DVT, involving the RIGHT femoral vein, profunda and CFV. 2. Suspected additional LEFT lower extremity DVT, involving the LEFT CFV. 3. No CTV  evidence of thrombus extension into the pelvic veins. These results will be called to the ordering clinician or representative by the Radiologist Assistant, and communication documented in the PACS or [employer]. Michaelle Birks, MD [facility] Radiology Electronically Signed   By: Michaelle Birks M.D.   On: 07/16/2021 16:17   VAS [place] LOWER EXTREMITY VENOUS (DVT)  Result Date: 07/16/2021  Lower Venous DVT Study Patient Name:  HAYATO GUAMAN [facility]  Date of Exam:   07/16/2021 Medical Rec #: [id number]               Accession #:    [id number] Date of Birth: Aug 16, 1955               Patient Gender: M Patient Age:   66 years Exam Location:  [facility] Procedure:      VAS [place] LOWER EXTREMITY VENOUS (DVT) Referring Phys: PAMELA LOVE --------------------------------------------------------------------------------  Indications: Immobility.  Limitations: Body habitus, poor ultrasound/tissue interface and left calf bandages. Performing Technologist: Archie Patten RVS  Examination Guidelines: A complete evaluation includes B-mode imaging, spectral Doppler, color Doppler, and power Doppler as  needed of all accessible portions of each vessel. Bilateral testing is considered an integral part of a complete examination. Limited examinations for reoccurring indications may be performed as noted. The reflux portion of the exam is performed with the patient in reverse Trendelenburg.  +---------+---------------+---------+-----------+----------+-------------------+  RIGHT     Compressibility Phasicity Spontaneity Properties Thrombus Aging       +---------+---------------+---------+-----------+----------+-------------------+  CFV       None            No        No                     Age Indeterminate    +---------+---------------+---------+-----------+----------+-------------------+  SFJ       None                                             Age Indeterminate    +---------+---------------+---------+-----------+----------+-------------------+  FV Prox   None                                             Age Indeterminate    +---------+---------------+---------+-----------+----------+-------------------+  FV Mid    None                                             Age Indeterminate    +---------+---------------+---------+-----------+----------+-------------------+  FV Distal None                                             Age Indeterminate    +---------+---------------+---------+-----------+----------+-------------------+  PFV       None                                             Age Indeterminate    +---------+---------------+---------+-----------+----------+-------------------+  POP       None            No        No                                          +---------+---------------+---------+-----------+----------+-------------------+  PTV       None                                                                  +---------+---------------+---------+-----------+----------+-------------------+  PERO                                                       Not well visualized   +---------+---------------+---------+-----------+----------+-------------------+  EIV                                                        patent with color                                                                doppler              +---------+---------------+---------+-----------+----------+-------------------+   +---------+---------------+---------+-----------+----------+-------------------+  LEFT      Compressibility Phasicity Spontaneity Properties Thrombus Aging       +---------+---------------+---------+-----------+----------+-------------------+  CFV       None            Yes       Yes                    Age Indeterminate    +---------+---------------+---------+-----------+----------+-------------------+  SFJ       None                                             Age Indeterminate    +---------+---------------+---------+-----------+----------+-------------------+  FV Prox   Full                                                                  +---------+---------------+---------+-----------+----------+-------------------+  FV Mid    Full            Yes       Yes                                         +---------+---------------+---------+-----------+----------+-------------------+  FV Distal Full            Yes       Yes                                         +---------+---------------+---------+-----------+----------+-------------------+  PFV       Full                                                                  +---------+---------------+---------+-----------+----------+-------------------+  POP       Full            Yes       Yes                                         +---------+---------------+---------+-----------+----------+-------------------+  PTV                                                        not visualized due                                                               to bandages          +---------+---------------+---------+-----------+----------+-------------------+   PERO                                                       not visualized due                                                               to bandages          +---------+---------------+---------+-----------+----------+-------------------+  EIV                                                        patent with color                                                                doppler              +---------+---------------+---------+-----------+----------+-------------------+  Summary: RIGHT: - Findings consistent with age indeterminate deep vein thrombosis involving the right common femoral vein, SF junction, right femoral vein, right proximal profunda vein, right popliteal vein, and right posterior tibial veins. - No cystic structure found in the popliteal fossa.  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 - No cystic structure found in the popliteal fossa.  *See table(s) above for measurements and observations. Electronically signed by Jamelle Haring on 07/16/2021 at 2:29:30 PM.    Final     Cardiac Studies   None recent   Patient Profile     66 y.o. male history of COPD, OSA, CAD, CHF, type 2 diabetes and atrial fibrillation.  Assessment & Plan    Paroxysmal atrial fibrillation-he is asymptomatic however notes of high heart rates when perform activity.  Okay to increase amiodarone to 200 mg daily.  Continue metoprolol 50 mg twice daily.  On Lovenox continue this.  DVT is being managed by the primary team he is on Lovenox therapeutic  Chronic diastolic heart failure-appears to be euvolemic.  Echo in 2021 showed normal EF.  No need for any repeat echocardiogram at this time.  Cautious diuretics as appropriate.  History of GI bleeding-team discussed with GI cleared patient to start anticoagulation.   [facility] will sign off.   Medication Recommendations: Amiodarone 200 mg twice daily, continue  Metroprolol tartrate 50 mg twice daily, continue anticoagulation he is on low molecular weight heparin which is also covering for his DVT. Other recommendations (labs, testing, etc): None Follow up as an outpatient:  post discharge  For questions or updates, please contact Zena Please consult [email or web address] for contact info under Cardiology/STEMI.      Signed, Jerianne Anselmo, DO  07/17/2021, 11:09 AM

## 2021-07-17 NOTE — IPOC Note (Signed)
Overall Plan of Care [facility]) Patient Details Name: Traxton Kolenda MRN: [id number] DOB: 06/03/1955  Admitting Diagnosis: Critical illness myopathy  Hospital Problems: Principal Problem:   Critical illness myopathy     Functional Problem List: Nursing Bladder, Bowel, Endurance, Nutrition, Pain, Safety, Skin Integrity  PT Balance, Edema, Endurance, Motor, Nutrition, Pain, Safety, Sensory, Skin Integrity  OT Balance, Cognition, Edema, Endurance, Motor, Safety, Skin Integrity  SLP    TR         Basic ADLs: OT Eating, Grooming, Bathing, Dressing, Toileting     Advanced  ADLs: OT       Transfers: PT Bed Mobility, Bed to Chair, Car  OT Toilet     Locomotion: PT Ambulation, Emergency planning/management officer, Stairs     Additional Impairments: OT Fuctional Use of Upper Extremity  SLP Swallowing, Social Cognition   Memory  TR      Anticipated Outcomes Item Anticipated Outcome  Self Feeding Supervision A  Swallowing  sup   Basic self-care  Mod A  Toileting  Mod A   Bathroom Transfers Mod A  Bowel/Bladder  mod assist  Transfers  modA with LRAD  Locomotion  modA with LRAD - anticipate primarily wheelchair level  Communication     Cognition  mod I-to-sup A (further assessment warranted - may change)  Pain  <3  Safety/Judgment  mod assist   Therapy Plan: PT Intensity: Minimum of 1-2 x/day ,45 to 90 minutes PT Frequency: 5 out of 7 days PT Duration Estimated Length of Stay: 3-4 weeks OT Intensity: Minimum of 1-2 x/day, 45 to 90 minutes OT Frequency: 5 out of 7 days OT Duration/Estimated Length of Stay: 3.5 weeks SLP Intensity: Minumum of 1-2 x/day, 30 to 90 minutes SLP Frequency: 3 to 5 out of 7 days SLP Duration/Estimated Length of Stay: 3.5 weeks   Due to the current state of emergency, patients may not be receiving their 3-hours of Medicare-mandated therapy.   Team Interventions: Nursing Interventions Patient/Family Education, Bladder Management, Bowel  Management, Disease Management/Prevention, Pain Management, Skin Care/Wound Management, Discharge Planning  PT interventions Ambulation/gait training, Balance/vestibular training, Cognitive remediation/compensation, Community reintegration, Discharge planning, Disease management/prevention, DME/adaptive equipment instruction, Functional electrical stimulation, Functional mobility training, Neuromuscular re-education, Pain management, Patient/family education, Psychosocial support, Skin care/wound management, Splinting/orthotics, Therapeutic Activities, Therapeutic Exercise, UE/LE Strength taining/ROM, UE/LE Coordination activities, Visual/perceptual remediation/compensation, Wheelchair propulsion/positioning, Stair training  OT Interventions Training and development officer, Cognitive remediation/compensation, Academic librarian, Discharge planning, Disease mangement/prevention, Engineer, drilling, Functional mobility training, Pain management, Patient/family education, Psychosocial support, Self Care/advanced ADL retraining, Skin care/wound managment, Splinting/orthotics, Therapeutic Activities, Therapeutic Exercise, UE/LE Strength taining/ROM, UE/LE Coordination activities, Wheelchair propulsion/positioning  SLP Interventions Functional tasks, Patient/family education, Dysphagia/aspiration precaution training, Therapeutic Activities, Therapeutic Exercise  TR Interventions    SW/CM Interventions Discharge Planning, Psychosocial Support, Patient/Family Education   Barriers to Discharge MD  Medical stability, Home enviroment access/loayout, Trach, New diabetic, Incontinence, Wound care, Lack of/limited family support, Weight, Weight bearing restrictions, and New oxygen  Nursing Decreased caregiver support, Incontinence, Wound Care, Lack of/limited family support, Weight 1 level, ramped entrance. Children can provide mod assist.  PT Decreased caregiver support, Home environment  access/layout, Trach, Incontinence, Wound Care, Lack of/limited family support, Insurance for SNF coverage, Weight, Nutrition means    OT Incontinence, Wound Care    SLP      SW       Team Discharge Planning: Destination: PT-Home ,OT- Home , SLP-Home Projected Follow-up: PT-24 hour supervision/assistance, Home health PT, OT-  Home health OT, SLP-Other (comment) (  TBD) Projected Equipment Needs: PT-To be determined, OT- To be determined, SLP-To be determined Equipment Details: PT- , OT-  Patient/family involved in discharge planning: PT- Patient,  OT-Patient, SLP-Patient  MD ELOS: 3-4 weeks Medical Rehab Prognosis:  Fair Assessment: Pt is a 66 yr old male with necrotizing fasciitis as well as resp failure s/p trach and PEG- with critical illness myopathy- unlikely to get him off trach- now has extensive DVTs and on Lovneox- recent GI bleed; bacteremia-DM; and Tfs;  Goals mod A- pt is extremely weak and don't see him getting better within time frame    See Team Conference Notes for weekly updates to the plan of care

## 2021-07-17 NOTE — Evaluation (Signed)
Physical Therapy Assessment and Plan  Patient Details  Name: Tony Young MRN: [id number] Date of Birth: 1956-01-28  PT Diagnosis: Abnormal posture, Difficulty walking, Muscle weakness, and Pain in buttock Rehab Potential: Fair ELOS: 3-4 weeks   Today's Date: 07/17/2021 PT Individual Time: 1030-1200 PT Individual Time Calculation (min): 90 min    Hospital Problem: Principal Problem:   Critical illness myopathy   Past Medical History:  Past Medical History:  Diagnosis Date   CAD (coronary artery disease)    Cardiomyopathy ([place])    Colon cancer ([place])    COPD (chronic obstructive pulmonary disease) ([place])    OSA (obstructive sleep apnea)    Type 2 diabetes mellitus ([place])    Past Surgical History:  Past Surgical History:  Procedure Laterality Date   COLON RESECTION     IR REMOVAL TUN CV CATH W/O FL  07/01/2021   PEG PLACEMENT  05/26/2021   TRACHEOSTOMY  05/26/2021    Assessment & Plan Clinical Impression: Patient is a 66 year old male with  history of COPD, OSA, CAD/CHF (non-compliance with lasix), T2DM, colon cancer s/p RSXN; who was originally admitted to [facility] on 05/13/21 with fever X few days, progressive SOB,tachypnea and hypoxia. He was treated with IV diuresis for fluid overload, started on steroids due to concerns of AECOPD and required CPAP. He developed A fib with RVR  in ED treated with diltiazem which was d/c due to hypotension with septic shock due to necrotizing fasciitis of LLE with strep pyogenes bacteremia. He was intubated due to respiratory failure, required pressors for BP support but went on to developed acute renal failure due to ATN with oliguria. He required multiple I and D of LLE 12/15, 12/16 and 12/24 with local care.  He required CRRT and transitioned to HD once stable. He was started on IV heparin but developed GIB 12/24 with EGD revealing gastric ulcer and required multiple units PRBCs. Hospital course significant for NSTEMI, fluid overload,  shocked liver with thrombocytopenia --DIC panel negative, encephalopathy,  VDRF requiring PEG/Trach on 12/27 and continued to be vent dependent with somnolence. IV antibiotics transitioned to doxycycline and flagyl thorough 06/20/21 per ID input.  He was discharged to [facility] 06/10/21  for further management.   While in [facility], he did develop SIRS/MDRO PNA and was treated with Zyvox X 7 days in addition to  solumedrol for AECOPD. He has had bouts of rapid A fib treated and was started on BB but resting heart rate remains 115-120 range without cardiac symptoms.  Modafinil and amantadine added to help manage tachycardia. His respiratory status has been improving and he was extubated to ATC by 01/25. He has had issues with overload but unable to tolerate aggressive diuresis due to recurrent AKI/Hypernatremia.  He is tolerating plugging during the day with ATC at nights and continues on steroid taper.  Hypernatremia has resolved and renal status is improving. MBS performed on 02/09 and he was started on D1, thins with full supervision for safety--No straws. Tube feeds changed to nights and lantus being titrated upwards for BS management. Mentation has improved with increase in activity tolerance but he continues to be limited by significant weakness. CIR recommended due to functional decline. Currently somnolent.  Patient transferred to CIR on 07/15/2021 .   Patient currently requires total +2 with mobility secondary to muscle weakness and muscle joint tightness, decreased cardiorespiratoy endurance, decreased problem solving and decreased memory, and decreased sitting balance, decreased standing balance, decreased postural control, and decreased balance strategies.  Prior to hospitalization, patient was independent  with mobility and lived with Family in a House home.  Home access is  Ramped entrance.  Patient will benefit from skilled PT intervention to maximize safe functional mobility, minimize fall risk, and  decrease caregiver burden for planned discharge home with 24 hour assist.  Anticipate patient will benefit from follow up [facility] at discharge.  PT - End of Session Activity Tolerance: Tolerates < 10 min activity with changes in vital signs Endurance Deficit: Yes Endurance Deficit Description: Quick to fatigue, poor activity tolerance. PT Assessment Rehab Potential (ACUTE/IP ONLY): Fair PT Barriers to Discharge: Decreased caregiver support;Home environment access/layout;Trach;Incontinence;Wound Care;Lack of/limited family support;Insurance for SNF coverage;Weight;Nutrition means PT Patient demonstrates impairments in the following area(s): Balance;Edema;Endurance;Motor;Nutrition;Pain;Safety;Sensory;Skin Integrity PT Transfers Functional Problem(s): Bed Mobility;Bed to Chair;Car PT Locomotion Functional Problem(s): Ambulation;Wheelchair Mobility;Stairs PT Plan PT Intensity: Minimum of 1-2 x/day ,45 to 90 minutes PT Frequency: 5 out of 7 days PT Duration Estimated Length of Stay: 3-4 weeks PT Treatment/Interventions: Ambulation/gait training;Balance/vestibular training;Cognitive remediation/compensation;Community reintegration;Discharge planning;Disease management/prevention;DME/adaptive equipment instruction;Functional electrical stimulation;Functional mobility training;Neuromuscular re-education;Pain management;Patient/family education;Psychosocial support;Skin care/wound management;Splinting/orthotics;Therapeutic Activities;Therapeutic Exercise;UE/LE Strength taining/ROM;UE/LE Coordination activities;Visual/perceptual remediation/compensation;Wheelchair propulsion/positioning;Stair training PT Transfers Anticipated Outcome(s): modA with LRAD PT Locomotion Anticipated Outcome(s): modA with LRAD - anticipate primarily wheelchair level PT Recommendation Recommendations for Other Services: Neuropsych consult Follow Up Recommendations: 24 hour supervision/assistance;Home health PT Patient destination:  Home Equipment Recommended: To be determined   PT Evaluation Precautions/Restrictions Precautions Precautions: Fall Precaution Comments: trach, PEG, unable to read, fragile skin Restrictions Weight Bearing Restrictions: No Pain Pain Assessment Pain Scale: 0-10 Pain Score: 8  Pain Type: Acute pain Pain Location: Buttocks Pain Descriptors / Indicators: Aching Pain Intervention(s): Repositioned;Emotional support Pain Interference Pain Interference Pain Effect on Sleep: 4. Almost constantly Pain Interference with Therapy Activities: 2. Occasionally Pain Interference with Day-to-Day Activities: 4. Almost constantly Home Living/Prior Functioning Home Living Available Help at Discharge: Family;Available 24 hours/day Type of Home: House Home Access: Ramped entrance Home Layout: One level Bathroom Shower/Tub: Chiropodist: Standard Bathroom Accessibility: Yes  Lives With: Family Prior Function Level of Independence: Independent with basic ADLs;Independent with transfers;Independent with homemaking with wheelchair Driving: Yes Vocation: Retired Leisure: Hobbies-yes (Comment) (welding, tractors) Vision/Perception  Vision - History Ability to See in Adequate Light: 2 Moderately impaired Perception Perception: Within Functional Limits Praxis Praxis: Intact  Cognition Overall Cognitive Status: Within Functional Limits for tasks assessed Arousal/Alertness: Awake/alert Orientation Level: Oriented X4 Attention: Selective Selective Attention: Appears intact Memory: Impaired Memory Impairment: Decreased recall of new information;Decreased short term memory Decreased Short Term Memory: Verbal basic;Functional basic Awareness: Appears intact Problem Solving: Appears intact Safety/Judgment: Appears intact Sensation Sensation Light Touch: Impaired by gross assessment Hot/Cold: Not tested Proprioception: Appears Intact Stereognosis: Not tested Additional  Comments: Able to discern light touch proximally > distally, inconsistencies from knee to feet. Coordination Gross Motor Movements are Fluid and Coordinated: No Fine Motor Movements are Fluid and Coordinated: No Coordination and Movement Description: Movement patterns impacted by body habitus, global deconditioning, and global weakness. Heel Shin Test: UTA due to hip weakness Motor  Motor Motor: Other (comment);Abnormal postural alignment and control Motor - Skilled Clinical Observations: Generalized weakness/debility   Trunk/Postural Assessment  Cervical Assessment Cervical Assessment: Exceptions to [facility] (forward head posturing with limited ability to maintain neutral posture while sitting EOB. Limited cervical rotation L<>R while sitting EOB) Thoracic Assessment Thoracic Assessment: Exceptions to [facility] (Rounded shoulders, thoracic kyphosis) Lumbar Assessment Lumbar Assessment: Exceptions to [facility] (posterior tilt) Postural Control Postural Control: Deficits  on evaluation (delayed and inadequate. L and posterior lean in sitting)  Balance Balance Balance Assessed: Yes Static Sitting Balance Static Sitting - Balance Support: No upper extremity supported;Feet supported Static Sitting - Level of Assistance: 2: Max assist Static Sitting - Comment/# of Minutes: 3 Dynamic Sitting Balance Dynamic Sitting - Balance Support: No upper extremity supported;Feet supported Dynamic Sitting - Level of Assistance: 2: Max assist Dynamic Sitting - Balance Activities: Reaching across midline;Trunk control activities;Forward lean/weight shifting;Lateral lean/weight shifting Extremity Assessment      RLE Assessment RLE Assessment: Exceptions to [facility] RLE Strength RLE Overall Strength: Deficits Right Hip Flexion: 2/5 Right Hip ABduction: 2+/5 Right Hip ADduction: 2+/5 Right Knee Flexion: 2+/5 Right Knee Extension: 3-/5 Right Ankle Dorsiflexion: 4-/5 Right Ankle Plantar Flexion: 4-/5 LLE Assessment LLE  Assessment: Exceptions to WFL LLE Strength LLE Overall Strength: Deficits Left Hip Flexion: 2/5 Left Hip ABduction: 2+/5 Left Hip ADduction: 2+/5 Left Knee Flexion: 2+/5 Left Knee Extension: 3-/5 Left Ankle Dorsiflexion: 2/5 Left Ankle Plantar Flexion: 2+/5  Care Tool Care Tool Bed Mobility Roll left and right activity   Roll left and right assist level: Total Assistance - Patient < 25%    Sit to lying activity   Sit to lying assist level: 2 Helpers    Lying to sitting on side of bed activity   Lying to sitting on side of bed assist level: the ability to move from lying on the back to sitting on the side of the bed with no back support.: 2 Helpers     Care Tool Transfers Sit to stand transfer Sit to stand activity did not occur: Safety/medical concerns      Chair/bed transfer Chair/bed transfer activity did not occur: Safety/medical concerns       Toilet transfer Toilet transfer activity did not occur: Safety/medical concerns      Scientist, product/process development transfer activity did not occur: Safety/medical concerns        Care Tool Locomotion Ambulation Ambulation activity did not occur: Safety/medical concerns        Walk 10 feet activity Walk 10 feet activity did not occur: Safety/medical concerns       Walk 50 feet with 2 turns activity Walk 50 feet with 2 turns activity did not occur: Safety/medical concerns      Walk 150 feet activity Walk 150 feet activity did not occur: Safety/medical concerns      Walk 10 feet on uneven surfaces activity Walk 10 feet on uneven surfaces activity did not occur: Safety/medical concerns      Stairs Stair activity did not occur: Safety/medical concerns        Walk up/down 1 step activity Walk up/down 1 step or curb (drop down) activity did not occur: Safety/medical concerns      Walk up/down 4 steps activity Walk up/down 4 steps activity did not occur: Safety/medical concerns      Walk up/down 12 steps activity Walk up/down 12  steps activity did not occur: Safety/medical concerns      Pick up small objects from floor Pick up small object from the floor (from standing position) activity did not occur: Safety/medical concerns      Wheelchair Is the patient using a wheelchair?: Yes Type of Wheelchair: Manual Wheelchair activity did not occur: Safety/medical concerns      Wheel 50 feet with 2 turns activity Wheelchair 50 feet with 2 turns activity did not occur: Safety/medical concerns    Wheel 150 feet activity Wheelchair 150 feet activity did not  occur: Safety/medical concerns      Refer to Care Plan for Long Term Goals  SHORT TERM GOAL WEEK 1 PT Short Term Goal 1 (Week 1): Pt will complete bed mobility with maxA of 1 person PT Short Term Goal 2 (Week 1): Pt will complete bed<>chair transfer with maxA +2 PT Short Term Goal 3 (Week 1): Pt will complete sit<>stand transfer with maxA +2 PT Short Term Goal 4 (Week 1): Pt will tolerate sitting in TIS w/c for >2 hrs outside of therapy  Recommendations for other services: Neuropsych  Skilled Therapeutic Intervention Mobility Bed Mobility Bed Mobility: Rolling Right;Rolling Left;Supine to Sit;Sit to Supine Rolling Right: Maximal Assistance - Patient 25-49% Rolling Left: Total Assistance - Patient < 25% Supine to Sit: 2 Helpers Sit to Supine: 2 Helpers Transfers Transfers:  (Deferred - per PA, EOB only due to DVTs) Locomotion  Gait Ambulation: No Gait Gait: No Stairs / Additional Locomotion Stairs: No Wheelchair Mobility Wheelchair Mobility: No  Skilled Intervention: Pt supine in bed at start of PT evaluation. PA and LPN also at bedside requesting PT assistance for positioning in order to assess, photograph, and measure his LLE wound that was encompassing his L calf. Gathered PLOF, social factors, and pertinent medical history during this process. Pt oriented x4, able to follow simple 2-step commands. Pt's trached with PMV, vitals 97% on room air.  Reports his vision is baseline, wear's prescription glasses. Pt confirms that he is unable to read, this is baseline for him. PT evaluation with functional mobility completed as outlined above. Overall, requires totalA for rolling L and maxA for rolling R. Required +2 totalA (pt <25%) for supine<>sit with assist needed for both BLE management and posterior trunk support. At EOB, required totalA for positioning, scooting, and placing feet to support. He required maxA of 1 person for static sitting unsupported but this was able to progress to minA after a few minutes of cues and intervention. At EOB, completed 2x5 LAQ bilaterally (unable to achieve full extension) with 2sec isometric holds. In bed, he completed 2x5 heel slides (AAROM). Once we returned to supine position, LPN entering and requesting assistance for transferring him from hospital bed to [employer] AIR mattress bed. This was completed with +4 assist via dependant lateral slide. Direct handoff of care to LPN at end of session.  Discharge Criteria: Patient will be discharged from PT if patient refuses treatment 3 consecutive times without medical reason, if treatment goals not met, if there is a change in medical status, if patient makes no progress towards goals or if patient is discharged from hospital.  The above assessment, treatment plan, treatment alternatives and goals were discussed and mutually agreed upon: by patient  Alger Simons PT, DPT 07/17/2021, 12:29 PM

## 2021-07-17 NOTE — Progress Notes (Signed)
Physical Therapy Wound Treatment Patient Details  Name: Shey Bartmess MRN: [id number] Date of Birth: 1955/10/11  Today's Date: 07/17/2021 PT Individual Time: 7824-2353 PT Individual Time Calculation (min): 30 min   Patient Active Problem List   Diagnosis Date Noted   Critical illness myopathy 07/15/2021   Acute on chronic respiratory failure with hypoxia ([place]) 06/20/2021   Severe sepsis with septic shock ([place]) 06/20/2021   Tracheostomy status ([place]) 06/20/2021   Chronic heart failure with preserved ejection fraction ([place]) 06/20/2021   AF (paroxysmal atrial fibrillation) ([place]) 06/20/2021   Acute renal failure due to tubular necrosis ([place]) 06/20/2021   Past Medical History:  Diagnosis Date   CAD (coronary artery disease)    Cardiomyopathy ([facility])    Colon cancer ([facility])    COPD (chronic obstructive pulmonary disease) ([facility])    OSA (obstructive sleep apnea)    Type 2 diabetes mellitus ([facility])    Past Surgical History:  Procedure Laterality Date   COLON RESECTION     IR REMOVAL TUN CV CATH W/O FL  07/01/2021   PEG PLACEMENT  05/26/2021   TRACHEOSTOMY  05/26/2021    Subjective  Subjective: Pt agreeable to hydrotherapy Patient and Family Stated Goals: Heal wound Date of Onset:  (Unknown) Prior Treatments: Dressing changes  Pain Score: Pain Score: 8   Objective  Cognition: WFL Communication: WFL Mobility: Impaired:  ROM: Impaired:  Sensation: not tested Protective sensation (10g): not tested Signs of venous insufficiency: not tested Signs of arterial insufficiency:  not tested  Wound Assessment  Pressure Injury 07/15/21 Buttocks Left Unstageable - Full thickness tissue loss in which the base of the injury is covered by slough (yellow, tan, gray, green or brown) and/or eschar (tan, brown or black) in the wound bed. (Active)  Wound Image   07/17/21 1518  Dressing Type Foam - Lift dressing to assess site every shift;Gauze (Comment);Santyl;Barrier Film (skin prep);Moist to  moist 07/17/21 1518  Dressing Clean, Dry, Intact 07/17/21 1518  Dressing Change Frequency Daily 07/17/21 1518  State of Healing Eschar 07/17/21 1518  Site / Wound Assessment Yellow;Black;Pink 07/17/21 1518  % Wound base Red or Granulating 5% 07/17/21 1518  % Wound base Yellow/Fibrinous Exudate 20% 07/17/21 1518  % Wound base Black/Eschar 75% 07/17/21 1518  % Wound base Other/Granulation Tissue (Comment) 0% 07/17/21 1518  Peri-wound Assessment Intact 07/17/21 1518  Wound Length (cm) 7.2 cm 07/17/21 1518  Wound Width (cm) 7 cm 07/17/21 1518  Wound Depth (cm) 0.2 cm 07/17/21 1518  Wound Surface Area (cm^2) 50.4 cm^2 07/17/21 1518  Wound Volume (cm^3) 10.08 cm^3 07/17/21 1518  Tunneling (cm) 0 07/17/21 1518  Undermining (cm) 0 07/17/21 1518  Margins Unattached edges (unapproximated) 07/17/21 1518  Drainage Amount Minimal 07/17/21 1518  Drainage Description Purulent 07/17/21 1518  Treatment Debridement (Selective);Hydrotherapy (Pulse lavage);Packing (Saline gauze) 07/17/21 1518      Hydrotherapy Pulsed lavage therapy - wound location: L buttock Pulsed Lavage with Suction (psi): 12 psi Pulsed Lavage with Suction - Normal Saline Used: 1000 mL Pulsed Lavage Tip: Tip with splash shield Selective Debridement Selective Debridement - Location: L buttock Selective Debridement - Tools Used: Forceps;Scalpel Selective Debridement - Tissue Removed: Eschar   Wound Assessment and Plan  Wound Therapy - Assess/Plan/Recommendations Wound Therapy - Clinical Statement: Pt presents to hydrotherapy with an unstagable pressure injury to the L buttock. Debridement initiated and a significant amount of eschar able to be removed this session. Wound Therapy - Functional Problem List: Immobility Factors Delaying/Impairing Wound Healing: Diabetes Mellitus;Immobility;Multiple medical problems;Polypharmacy  Hydrotherapy Plan: Debridement;Dressing change;Patient/family education;Pulsatile lavage with suction Wound  Therapy - Frequency: 6X / week Wound Therapy - Follow Up Recommendations: dressing changes by RN  Wound Therapy Goals- Improve the function of patient's integumentary system by progressing the wound(s) through the phases of wound healing (inflammation - proliferation - remodeling) by: Decrease Necrotic Tissue to: 20% Decrease Necrotic Tissue - Progress: Goal set today Increase Granulation Tissue to: 80% Increase Granulation Tissue - Progress: Goal set today Goals/treatment plan/discharge plan were made with and agreed upon by patient/family: Yes Time For Goal Achievement: 7 days Wound Therapy - Potential for Goals: Good  Goals will be updated until maximal potential achieved or discharge criteria met.  Discharge criteria: when goals achieved, discharge from hospital, MD decision/surgical intervention, no progress towards goals, refusal/missing three consecutive treatments without notification or medical reason.  [name] 07/17/2021, 3:26 PM  Rolinda Roan, PT, DPT Acute Rehabilitation Services Pager: [phone] Office: [phone]

## 2021-07-17 NOTE — Progress Notes (Signed)
Calorie Count Note  48 hour calorie count ordered.  Diet: Dysphagia 2, thin liquids  Supplements: Ensure BID each supplement provides 350 kcal and 20 grams of protein , 1 packet Juven BID, each packet provides 95 calories, 2.5 grams of protein  Breakfast: 130 kcal, 11 grams of protein  Lunch: 475 kcal, 24 grams of protein Dinner: 478 kcal, 29 grams of protein  Supplements: 445 kcal, 22.5 grams of protein  Day 1 Total intake: 1083 kcal (48% of minimum estimated needs)  86.5 grams of protein (79% of minimum estimated protein needs)  Estimated Nutritional Needs:  Kcal:  2250 - 2450   Protein:  110 - 125 grams   Fluid:  >/= 2.2 L  Pt was mostly asleep during visit. Per pt, pt endorses a good appetite today. Per meal documentation, pt consumed 45% of breakfast and lunch today. Pt's PO intake was 70-80% yesterday (07/16/2021). Pt encouraged to eat meals throughout the day and to drink nutritional supplements to aid in caloric and protein intake. Dietetic intern/RD to follow up Monday (07/20/21) for Day 2 results.   Per chart review, nocturnal tube feeding began at 8:15 pm on 07/16/2021 and per nurse via secure chat, feeding stopped at 07:30 am this morning. RD to adjust tube feeding orders to line up with recommendations for tube feedings to run from 6pm-6am.    Nutrition Dx:    Increased nutrient needs related to other (see comment) (critical illness myopathy) as evidenced by estimated needs; ongoing   Goal:   Patient will meet greater than or equal to 90% of their needs; met, addressing via PO intake, nutritional supplements and nocturnal tube feeds   Intervention:   - Continue Ensure Enlive po BID, each supplement provides 350 kcal and 20 grams of protein.   - Continue MVI daily    - Continue 1 packet Juven BID, each packet provides 95 calories, 2.5 grams of protein (collagen), and 9.8 grams of carbohydrate (3 grams sugar); also contains 7 grams of L-arginine and L-glutamine,  300 mg vitamin C, 15 mg vitamin E, 1.2 mcg vitamin B-12, 9.5 mg zinc, 200 mg calcium, and 1.5 g  Calcium Beta-hydroxy-Beta-methylbutyrate to support wound healing   - Continue nocturnal tube feeding via PEG:  Osmolite 1.5 cal formula at rate of 75 ml/hr x 12 hours (6pm-6am)  - Nocturnal tube feed provides 1350 kcal, 56 gm protein, and 686 mL of free water. This meets 60% of estimated calorie needs and 50% of minimum estimated protein needs.   Maryruth Hancock, Dietetic Intern 07/17/2021 3:23 PM

## 2021-07-18 DIAGNOSIS — G7281 Critical illness myopathy: Secondary | ICD-10-CM | POA: Diagnosis not present

## 2021-07-18 LAB — CBC WITH DIFFERENTIAL/PLATELET
Abs Immature Granulocytes: 0.16 10*3/uL — ABNORMAL HIGH (ref 0.00–0.07)
Basophils Absolute: 0 10*3/uL (ref 0.0–0.1)
Basophils Relative: 0 %
Eosinophils Absolute: 0.1 10*3/uL (ref 0.0–0.5)
Eosinophils Relative: 2 %
HCT: 29 % — ABNORMAL LOW (ref 39.0–52.0)
Hemoglobin: 9.1 g/dL — ABNORMAL LOW (ref 13.0–17.0)
Immature Granulocytes: 5 %
Lymphocytes Relative: 16 %
Lymphs Abs: 0.6 10*3/uL — ABNORMAL LOW (ref 0.7–4.0)
MCH: 28.3 pg (ref 26.0–34.0)
MCHC: 31.4 g/dL (ref 30.0–36.0)
MCV: 90.1 fL (ref 80.0–100.0)
Monocytes Absolute: 0.4 10*3/uL (ref 0.1–1.0)
Monocytes Relative: 12 %
Neutro Abs: 2.2 10*3/uL (ref 1.7–7.7)
Neutrophils Relative %: 65 %
Platelets: UNDETERMINED 10*3/uL (ref 150–400)
RBC: 3.22 MIL/uL — ABNORMAL LOW (ref 4.22–5.81)
RDW: 16.1 % — ABNORMAL HIGH (ref 11.5–15.5)
WBC: 3.4 10*3/uL — ABNORMAL LOW (ref 4.0–10.5)
nRBC: 0 % (ref 0.0–0.2)

## 2021-07-18 LAB — GLUCOSE, CAPILLARY
Glucose-Capillary: 421 mg/dL — ABNORMAL HIGH (ref 70–99)
Glucose-Capillary: 429 mg/dL — ABNORMAL HIGH (ref 70–99)
Glucose-Capillary: 274 mg/dL — ABNORMAL HIGH (ref 70–99)
Glucose-Capillary: 223 mg/dL — ABNORMAL HIGH (ref 70–99)
Glucose-Capillary: 342 mg/dL — ABNORMAL HIGH (ref 70–99)

## 2021-07-18 MED ORDER — INSULIN ASPART 100 UNIT/ML IJ SOLN
0.0000 [IU] | Freq: Three times a day (TID) | INTRAMUSCULAR | Status: DC
  Administered 2021-07-18: 5 [IU] via SUBCUTANEOUS
  Administered 2021-07-19: 15 [IU] via SUBCUTANEOUS
  Administered 2021-07-19: 5 [IU] via SUBCUTANEOUS
  Administered 2021-07-19: 8 [IU] via SUBCUTANEOUS
  Administered 2021-07-20: 5 [IU] via SUBCUTANEOUS
  Administered 2021-07-20 (×2): 8 [IU] via SUBCUTANEOUS
  Administered 2021-07-21: 15 [IU] via SUBCUTANEOUS
  Administered 2021-07-21 (×2): 8 [IU] via SUBCUTANEOUS
  Administered 2021-07-22 (×2): 3 [IU] via SUBCUTANEOUS
  Administered 2021-07-23 – 2021-07-24 (×4): 2 [IU] via SUBCUTANEOUS
  Administered 2021-07-25 (×2): 5 [IU] via SUBCUTANEOUS
  Administered 2021-07-25: 2 [IU] via SUBCUTANEOUS
  Administered 2021-07-26 (×2): 3 [IU] via SUBCUTANEOUS
  Administered 2021-07-26: 2 [IU] via SUBCUTANEOUS
  Administered 2021-07-27: 5 [IU] via SUBCUTANEOUS
  Administered 2021-07-27: 2 [IU] via SUBCUTANEOUS
  Administered 2021-07-28: 3 [IU] via SUBCUTANEOUS

## 2021-07-18 MED ORDER — INSULIN GLARGINE-YFGN 100 UNIT/ML ~~LOC~~ SOLN
10.0000 [IU] | Freq: Every day | SUBCUTANEOUS | Status: DC
  Administered 2021-07-18: 10 [IU] via SUBCUTANEOUS
  Filled 2021-07-18: qty 0.1

## 2021-07-18 NOTE — Progress Notes (Signed)
PROGRESS NOTE   Subjective/Complaints:  Pt alert watching a movie.  States that he wears PMV all night without issues   ROS:  Pt denies SOB, abd pain, CP, N/V/C/D, and vision changes    Objective:   CT VENOGRAM ABD/PEL  Result Date: 07/16/2021 CLINICAL DATA:  Deep venous thrombosis (DVT) EXAM: CT VENOGRAM ABDOMEN AND PELVIS WITH CONTRAST TECHNIQUE: Multidetector CT imaging of the abdomen and pelvis was performed using the standard protocol following bolus administration of intravenous contrast. RADIATION DOSE REDUCTION: This exam was performed according to the departmental dose-optimization program which includes automated exposure control, adjustment of the mA and/or kV according to patient size and/or use of iterative reconstruction technique. CONTRAST:  14mL OMNIPAQUE IOHEXOL 350 MG/ML SOLN COMPARISON:  Chest XR, 07/15/2021. FINDINGS: Suboptimal evaluation, secondary to poor contrast opacification such that nonocclusive thrombus could missed. Lower chest: Small volume RIGHT pleural effusion. Cardiomegaly with multivessel coronary atherosclerosis. Hepatobiliary: Mild nodularity of liver contour, suspicious for cirrhosis. No focal liver lesion is seen. No gallstones, gallbladder wall thickening, or biliary dilatation. Pancreas: No pancreatic ductal dilatation or surrounding inflammatory changes. Spleen: Normal in size without focal abnormality. Perihilar accessory spleen. Adrenals/Urinary Tract: Adrenal glands are unremarkable. Kidneys are normal, without renal calculi, focal lesion, or hydronephrosis. Mild distention of the urinary bladder. Anterior non dependent intraluminal gas, within urinary bladder, correlate for recent catheterization. Stomach/Bowel: Gastrostomy, well-positioned at the antral stomach. Stomach is otherwise within normal limits. Nonobstructed small bowel. Appendix is not definitively visualized. Nondilated colon.  Postsurgical changes of LEFT colectomy with intact-appearing anastomosis. No evidence of bowel wall thickening, distention, or inflammatory changes. Vascular/Lymphatic: Aortic atherosclerosis without aneurysmal dilation. No enlarged abdominal or pelvic lymph nodes. *Venous expansion and appearance of filling defect involving the RIGHT SFV, profunda vein and CFV. *No evidence of thrombus extension into the RIGHT EIV. *Suspect additional venous expansion and filling defect involving the LEFT CFV. See key image. *No evidence of filling defect within the IVC or visualized portions of the pelvic veins. Reproductive: Prostate is unremarkable. Other: Gynecomastia. Anterior abdominal wall contusions, likely injection sites. No abdominal wall hernia. No abdominopelvic ascites. Musculoskeletal: No acute osseous findings. IMPRESSION: Suboptimal evaluation, within these constraints; 1. RIGHT lower extremity DVT, involving the RIGHT femoral vein, profunda and CFV. 2. Suspected additional LEFT lower extremity DVT, involving the LEFT CFV. 3. No CTV evidence of thrombus extension into the pelvic veins. These results will be called to the ordering clinician or representative by the Radiologist Assistant, and communication documented in the PACS or [employer]. Michaelle Birks, MD [facility] Radiology Electronically Signed   By: Michaelle Birks M.D.   On: 07/16/2021 16:17   Recent Labs    07/16/21 0612 07/18/21 0552  WBC 2.9* 3.4*  HGB 9.0* 9.1*  HCT 28.6* 29.0*  PLT 166 PLATELET CLUMPS NOTED ON SMEAR, UNABLE TO ESTIMATE    Recent Labs    07/16/21 0612  NA 138  K 3.9  CL 102  CO2 24  GLUCOSE 115*  BUN 40*  CREATININE 1.20  CALCIUM 10.2     Intake/Output Summary (Last 24 hours) at 07/18/2021 1254 Last data filed at 07/18/2021 0900 Gross per 24 hour  Intake 710 ml  Output --  Net 710 ml      Pressure Injury 07/15/21 Buttocks Left Unstageable - Full  thickness tissue loss in which the base of the injury is covered by slough (yellow, tan, gray, green or brown) and/or eschar (tan, brown or black) in the wound bed. (Active)  07/15/21 1520  Location: Buttocks  Location Orientation: Left  Staging: Unstageable - Full thickness tissue loss in which the base of the injury is covered by slough (yellow, tan, gray, green or brown) and/or eschar (tan, brown or black) in the wound bed.  Wound Description (Comments):   Present on Admission: Yes    Physical Exam: Vital Signs Blood pressure 99/61, pulse (!) 109, temperature 98 F (36.7 C), resp. rate 18, height 5\' 7"  (1.702 m), weight 116.4 kg, SpO2 99 %.     General: awake, alert, appropriate, initially asleep, but woke to stimuli; NAD HENT: conjugate gaze; oropharynx moist- trach in place- helped place PMV- mild sputum from trach;  CV: borderline tachycardic rate but in afib; no JVD Pulmonary: slightly coarse throughout- with a few wheezes, but good air movement GI: soft, NT, ND, (+)BS- protuberant Psychiatric: appropriate Neurological: Ox3-  Musculoskeletal:   R shoulder (+) impingement vs partial RTC tear on R shoulder- Also TTP posteriorly.       General: Swelling present.     Comments: 2-3+ edema LLE. Dependent edema bilateral forearms R>L.   Skin:    General: Skin is warm and dry.     Comments: Multiple scabs on bilateral hands. LLE with bulking dressing and dried serous drainage-  .- a lot of ecchymoses in arms B/L    Neurological:     Mental Status: He is alert and oriented to person, place, and time.     Comments: Speech mildly dysarthric without dysphonia. Able to follow simple motor commands without difficulty. RUE weakness noted. Tends to keep LLE rotated outwards     Assessment/Plan: 1. Functional deficits which require 3+ hours per day of interdisciplinary therapy in a comprehensive inpatient rehab setting. Physiatrist is providing close team supervision and 24 hour  management of active medical problems listed below. Physiatrist and rehab team continue to assess barriers to discharge/monitor patient progress toward functional and medical goals  Care Tool:  Bathing    Body parts bathed by patient: Face   Body parts bathed by helper: Right arm, Left arm, Chest, Abdomen, Front perineal area, Buttocks, Right upper leg, Left upper leg, Right lower leg Body parts n/a: Left lower leg   Bathing assist Assist Level: Total Assistance - Patient < 25%     Upper Body Dressing/Undressing Upper body dressing   What is the patient wearing?: Pull over shirt    Upper body assist Assist Level: Total Assistance - Patient < 25%    Lower Body Dressing/Undressing Lower body dressing      What is the patient wearing?: Pants, Underwear/pull up     Lower body assist Assist for lower body dressing: 2 Helpers     Toileting Toileting    Toileting assist Assist for toileting: Total Assistance - Patient < 25%     Transfers Chair/bed transfer  Transfers assist  Chair/bed transfer activity did not occur: Safety/medical concerns        Locomotion Ambulation   Ambulation assist   Ambulation activity did not occur: Safety/medical concerns          Walk 10 feet activity   Assist  Walk 10 feet activity did not occur:  Safety/medical concerns        Walk 50 feet activity   Assist Walk 50 feet with 2 turns activity did not occur: Safety/medical concerns         Walk 150 feet activity   Assist Walk 150 feet activity did not occur: Safety/medical concerns         Walk 10 feet on uneven surface  activity   Assist Walk 10 feet on uneven surfaces activity did not occur: Safety/medical concerns         Wheelchair     Assist Is the patient using a wheelchair?: Yes Type of Wheelchair: Manual Wheelchair activity did not occur: Safety/medical concerns         Wheelchair 50 feet with 2 turns activity    Assist     Wheelchair 50 feet with 2 turns activity did not occur: Safety/medical concerns       Wheelchair 150 feet activity     Assist  Wheelchair 150 feet activity did not occur: Safety/medical concerns       Blood pressure 99/61, pulse (!) 109, temperature 98 F (36.7 C), resp. rate 18, height 5\' 7"  (1.702 m), weight 116.4 kg, SpO2 99 %.  Medical Problem List and Plan: 1. Functional deficits secondary to critical illness myopathy             -patient may shower             -ELOS/Goals: modA 20 days            pt didn't receive therapy yesterday due to extensive DVTs- con't eval for PT/OT and SLP today  2.  Antithrombotics: -DVT/anticoagulation:  Mechanical: Sequential compression devices, below knee Right lower extremity --high risk for DVTs due to body habitus and immobility. Will check dopplers in am. 2/17- pt has extensive DVTs in RLE and some in LLE- as above- placed on tx dose lovenox- will check CBC tomorrow due to risk of new GI bleed              -antiplatelet therapy: N/A 3. Pain: continue Oxycodone prn. 2/16- main pain is R shoulder- con't regimen prn  4. Mood: LCSW to follow for evaluation and support.              -antipsychotic agents:  Risperdal bid 5. Neuropsych: This patient is capable of making decisions on his own behalf. 6. Skin/Wound Care: Air mattress due to sacral decub and necrotizing fasciitis LLE-              --continue vitamins and supplements to promote wound healing.  7. Fluids/Electrolytes/Nutrition:  Strict I/O as continues to show signs of overload/anasarca.             --low salt diet. Continue Juven to help with protein stores.  2/16- albumin 2.6-  8. Strep pyogenes bacteremia/Necrotizing fascitis LLE s/p I & D: Continue daily dressing changes.              --maintain adequate nutritional status to help promote healing. 9. Acute  reps failure s/p trach- and COPD: Respiratory status stable. Continue steriod taper  2/16- con't PMV and SLP for  swallowing/speech/phonation 10 OSA/OHS?/VDRF: Continue button plugging during the day and ATC at nights for humidification             --Started discussion that he will likely go home with trach.  11. H/o GIB: H/H improving. Continue PPI BID.              --no  signs of bleeding. Will start low dose Lovenox if platelets stable.   Monitor CBC on anticoagulation  CBC Latest Ref Rng & Units 07/18/2021 07/16/2021 07/14/2021  WBC 4.0 - 10.5 K/uL 3.4(L) 2.9(L) 3.9(L)  Hemoglobin 13.0 - 17.0 g/dL 9.1(L) 9.0(L) 9.5(L)  Hematocrit 39.0 - 52.0 % 29.0(L) 28.6(L) 29.9(L)  Platelets 150 - 400 K/uL PLATELET CLUMPS NOTED ON SMEAR, UNABLE TO ESTIMATE 166 162   Stable 2/18 12. Thrombocytopenia: Platelets continue to fluctuate.  --Will recheck CBC in am to decide on initiation of AC  2/16- Plts up to 166k 13.  T2DM:  Check Hgb A1c in am. Will monitor BS ac/hs with SSI for elevated BS             Add 2U Semglee HS             --continue tube feeds at nights (likely contributing to diarrhea)     2/17- Pt CBGs are running 200sto300s- will increase Semglee to 6 units nightly- from 2 units and monitor for trend CBG (last 3)  Recent Labs    07/18/21 0602 07/18/21 0701 07/18/21 1208  GLUCAP 421* 429* 274*  Increase semglee to 10U change SSI to Moderate   14. R RTC injury- probably partial tear-   2/16- sustained in [facility]- will write note above HOB to remind staff to protect R shoulder.  15. Extensive B/L DVTs-   2/17- will check CBC this weekend, since recent hx of GI bleed. Don't want pt having PE due to trach and resp failure requiring trach.     LOS: 3 days A FACE TO FACE EVALUATION WAS PERFORMED  Charlett Blake 07/18/2021, 12:54 PM

## 2021-07-18 NOTE — Progress Notes (Signed)
Physical Therapy Wound Treatment Patient Details  Name: Tony Young MRN: [id number] Date of Birth: [birth year]  Today's Date: 07/18/2021 PT Individual Time: 0924-0957 PT Individual Time Calculation (min): 33 min   Subjective  Subjective: Pt agreeable to hydrotherapy Patient and Family Stated Goals: Heal wound Date of Onset:  (Unknown) Prior Treatments: Dressing changes  Pain Score: Pain Score: 0-No pain  Wound Assessment  Pressure Injury 07/15/21 Buttocks Left Unstageable - Full thickness tissue loss in which the base of the injury is covered by slough (yellow, tan, gray, green or brown) and/or eschar (tan, brown or black) in the wound bed. (Active)  Wound Image  07/17/21 1518  Dressing Type Foam - Lift dressing to assess site every shift;Gauze (Comment);Santyl;Barrier Film (skin prep);Moist to moist 07/18/21 1009  Dressing Clean, Dry, Intact;Changed 07/18/21 1009  Dressing Change Frequency Daily 07/18/21 1009  State of Healing Eschar 07/18/21 1009  Site / Wound Assessment Yellow;Black;Pink 07/18/21 1009  % Wound base Red or Granulating 5% 07/18/21 1009  % Wound base Yellow/Fibrinous Exudate 95% 07/18/21 1009  % Wound base Black/Eschar 0% 07/18/21 1009  % Wound base Other/Granulation Tissue (Comment) 0% 07/18/21 1009  Peri-wound Assessment Intact 07/18/21 1009  Wound Length (cm) 7.2 cm 07/17/21 1518  Wound Width (cm) 7 cm 07/17/21 1518  Wound Depth (cm) 0.2 cm 07/17/21 1518  Wound Surface Area (cm^2) 50.4 cm^2 07/17/21 1518  Wound Volume (cm^3) 10.08 cm^3 07/17/21 1518  Tunneling (cm) 0 07/17/21 1518  Undermining (cm) 0 07/17/21 1518  Margins Unattached edges (unapproximated) 07/18/21 1009  Drainage Amount Minimal 07/18/21 1009  Drainage Description Serosanguineous 07/18/21 1009  Treatment Debridement (Selective);Hydrotherapy (Pulse lavage) 07/18/21 1009      Hydrotherapy Pulsed lavage therapy - wound location: L buttock Pulsed Lavage with Suction (psi): 12  psi Pulsed Lavage with Suction - Normal Saline Used: 1000 mL Pulsed Lavage Tip: Tip with splash shield Selective Debridement Selective Debridement - Location: L buttock Selective Debridement - Tools Used: Forceps;Scalpel Selective Debridement - Tissue Removed: yellow eschar,slough   Wound Assessment and Plan  Wound Therapy - Assess/Plan/Recommendations Wound Therapy - Clinical Statement: Pt presents to hydrotherapy with an unstagable pressure injury to the L buttock. Debridement continued to remove adherent yellow eschar. Somewhat limited by excessive bleeding, stopped by holding pressure Wound Therapy - Functional Problem List: Immobility Factors Delaying/Impairing Wound Healing: Diabetes Mellitus;Immobility;Multiple medical problems;Polypharmacy Hydrotherapy Plan: Debridement;Dressing change;Patient/family education;Pulsatile lavage with suction Wound Therapy - Frequency: 6X / week Wound Therapy - Follow Up Recommendations: dressing changes by RN  Wound Therapy Goals- Improve the function of patient's integumentary system by progressing the wound(s) through the phases of wound healing (inflammation - proliferation - remodeling) by: Decrease Necrotic Tissue to: 20% Decrease Necrotic Tissue - Progress: Progressing toward goal Increase Granulation Tissue to: 80% Increase Granulation Tissue - Progress: Progressing toward goal Goals/treatment plan/discharge plan were made with and agreed upon by patient/family: Yes Time For Goal Achievement: 7 days Wound Therapy - Potential for Goals: Good  Goals will be updated until maximal potential achieved or discharge criteria met.  Discharge criteria: when goals achieved, discharge from hospital, MD decision/surgical intervention, no progress towards goals, refusal/missing three consecutive treatments without notification or medical reason.  GP      Arby Barrette, PT Acute Rehabilitation Services  Pager [phone] Office [phone]  Rexanne Mano 07/18/2021, 10:15 AM

## 2021-07-19 DIAGNOSIS — G7281 Critical illness myopathy: Secondary | ICD-10-CM | POA: Diagnosis not present

## 2021-07-19 LAB — GLUCOSE, CAPILLARY
Glucose-Capillary: 421 mg/dL — ABNORMAL HIGH (ref 70–99)
Glucose-Capillary: 294 mg/dL — ABNORMAL HIGH (ref 70–99)
Glucose-Capillary: 253 mg/dL — ABNORMAL HIGH (ref 70–99)
Glucose-Capillary: 319 mg/dL — ABNORMAL HIGH (ref 70–99)

## 2021-07-19 LAB — GLUCOSE, RANDOM: Glucose, Bld: 431 mg/dL — ABNORMAL HIGH (ref 70–99)

## 2021-07-19 MED ORDER — OSMOLITE 1.5 CAL PO LIQD
900.0000 mL | ORAL | Status: DC
  Administered 2021-07-19 – 2021-07-23 (×5): 900 mL
  Filled 2021-07-19 (×6): qty 948

## 2021-07-19 MED ORDER — INSULIN GLARGINE-YFGN 100 UNIT/ML ~~LOC~~ SOLN
20.0000 [IU] | Freq: Every day | SUBCUTANEOUS | Status: DC
  Administered 2021-07-19 – 2021-07-20 (×2): 20 [IU] via SUBCUTANEOUS
  Filled 2021-07-19 (×3): qty 0.2

## 2021-07-19 NOTE — Progress Notes (Signed)
Speech Language Pathology Daily Session Note  Patient Details  Name: Tony Young MRN: [id number] Date of Birth: [birth year]  Today's Date: 07/19/2021 SLP Individual Time: 0355-9741 SLP Individual Time Calculation (min): 60 min  Short Term Goals: Week 1: SLP Short Term Goal 1 (Week 1): Patient will tolerate current diet with min A verbal cues for implementing safe swallowing precautions and strategies (e.g., no straws, single sips, small bites, slow rate). SLP Short Term Goal 2 (Week 1): Patient will consume dys 3 trials with effective mastication, minimal oral residuals, and without overt s/sx of aspiration with min A verbal cues to implement safe swallow precautions and strategies prior to diet advancement SLP Short Term Goal 3 (Week 1): Patient will participate in further cognitive-linguistic assessment SLP Short Term Goal 3 - Progress (Week 1): Met SLP Short Term Goal 4 (Week 1): Patient will utilize external/internal memory aids to recall important information with minA.  Skilled Therapeutic Interventions:   Pt was seen in room for skilled ST services with focus on cognition. SLP administered [employer] Mental Status (SLUMS) examination to determine presence of cognitive impairment. Pt was unable to complete clock drawing due to UE weakness; therefore, SLP was unable to determine a "total score". Out of all possible sections, pt scored a 18/26 (total score = 30) indicating mild deficits in memory. Pt also demonstrated poor frustration tolerance, though SLP suspects this may be 2/2 feelings of sadness associated with not being home with family. Son-in-law was in room and had several questions about length of stay, anticipated d/c, and physical progress. SLP deferred to doc and case manager to answer further questions. SLP answered questions about typical trach progression and cognition. Pt declined any food/drink at this time; therefore, pt was not observed with current diet.  Son-in-law was able to recall that pt is to use the Provale cup while drinking liquids to lessen risk of aspiration. Pt was left in room with Son-in-law and bed alarm activated.   Pain Pain Assessment Pain Scale: 0-10 Pain Score: 0-No pain  Therapy/Group: Individual Therapy  Verdene Lennert 07/19/2021, 10:55 AM

## 2021-07-19 NOTE — Progress Notes (Addendum)
PROGRESS NOTE   Subjective/Complaints:  Pt states he is eating ~50% meals , still on TF, discussed elevated CBG   ROS:  Pt denies SOB, abd pain, CP, N/V/C/D, and vision changes    Objective:   No results found. Recent Labs    07/18/21 0552  WBC 3.4*  HGB 9.1*  HCT 29.0*  PLT PLATELET CLUMPS NOTED ON SMEAR, UNABLE TO ESTIMATE    Recent Labs    07/19/21 0723  GLUCOSE 431*     Intake/Output Summary (Last 24 hours) at 07/19/2021 1048 Last data filed at 07/19/2021 7001 Gross per 24 hour  Intake 240 ml  Output --  Net 240 ml      Pressure Injury 07/15/21 Buttocks Left Unstageable - Full thickness tissue loss in which the base of the injury is covered by slough (yellow, tan, gray, green or brown) and/or eschar (tan, brown or black) in the wound bed. (Active)  07/15/21 1520  Location: Buttocks  Location Orientation: Left  Staging: Unstageable - Full thickness tissue loss in which the base of the injury is covered by slough (yellow, tan, gray, green or brown) and/or eschar (tan, brown or black) in the wound bed.  Wound Description (Comments):   Present on Admission: Yes    Physical Exam: Vital Signs Blood pressure 112/68, pulse 90, temperature 97.9 F (36.6 C), temperature source Oral, resp. rate 16, height 5\' 7"  (1.702 m), weight 116.4 kg, SpO2 97 %.    General: No acute distress Mood and affect are appropriate Heart: Regular rate and rhythm no rubs murmurs or extra sounds Lungs: Clear to auscultation, breathing unlabored, no rales or wheezes Abdomen: Positive bowel sounds, soft nontender to palpation, nondistended G tube site CDI  Extremities: No clubbing, cyanosis, or edema    Musculoskeletal:   R shoulder (+) impingement vs partial RTC tear on R shoulder- Also TTP posteriorly.       General: Swelling present.     Comments: 2-3+ edema LLE. Dependent edema bilateral forearms R>L.   Skin:     General: Skin is warm and dry.     Comments: Multiple scabs on bilateral hands. LLE with bulking dressing and dried serous drainage-  .- a lot of ecchymoses in arms B/L    Neurological:     Mental Status: He is alert and oriented to person, place, and time.     Comments: Speech mildly dysarthric without dysphonia. Able to follow simple motor commands without difficulty. RUE weakness noted. Tends to keep LLE rotated outwards     Assessment/Plan: 1. Functional deficits which require 3+ hours per day of interdisciplinary therapy in a comprehensive inpatient rehab setting. Physiatrist is providing close team supervision and 24 hour management of active medical problems listed below. Physiatrist and rehab team continue to assess barriers to discharge/monitor patient progress toward functional and medical goals  Care Tool:  Bathing    Body parts bathed by patient: Face   Body parts bathed by helper: Right arm, Left arm, Chest, Abdomen, Front perineal area, Buttocks, Right upper leg, Left upper leg, Right lower leg Body parts n/a: Left lower leg   Bathing assist Assist Level: Total Assistance - Patient < 25%  Upper Body Dressing/Undressing Upper body dressing   What is the patient wearing?: Pull over shirt    Upper body assist Assist Level: Total Assistance - Patient < 25%    Lower Body Dressing/Undressing Lower body dressing      What is the patient wearing?: Pants, Underwear/pull up     Lower body assist Assist for lower body dressing: 2 Helpers     Toileting Toileting    Toileting assist Assist for toileting: Total Assistance - Patient < 25%     Transfers Chair/bed transfer  Transfers assist  Chair/bed transfer activity did not occur: Safety/medical concerns        Locomotion Ambulation   Ambulation assist   Ambulation activity did not occur: Safety/medical concerns          Walk 10 feet activity   Assist  Walk 10 feet activity did not occur:  Safety/medical concerns        Walk 50 feet activity   Assist Walk 50 feet with 2 turns activity did not occur: Safety/medical concerns         Walk 150 feet activity   Assist Walk 150 feet activity did not occur: Safety/medical concerns         Walk 10 feet on uneven surface  activity   Assist Walk 10 feet on uneven surfaces activity did not occur: Safety/medical concerns         Wheelchair     Assist Is the patient using a wheelchair?: Yes Type of Wheelchair: Manual Wheelchair activity did not occur: Safety/medical concerns         Wheelchair 50 feet with 2 turns activity    Assist    Wheelchair 50 feet with 2 turns activity did not occur: Safety/medical concerns       Wheelchair 150 feet activity     Assist  Wheelchair 150 feet activity did not occur: Safety/medical concerns       Blood pressure 112/68, pulse 90, temperature 97.9 F (36.6 C), temperature source Oral, resp. rate 16, height 5\' 7"  (1.702 m), weight 116.4 kg, SpO2 97 %.  Medical Problem List and Plan: 1. Functional deficits secondary to critical illness myopathy             -patient may shower             -ELOS/Goals: modA 20 days            pt didn't receive therapy yesterday due to extensive DVTs- con't eval for PT/OT and SLP today  2.  Antithrombotics: -DVT/anticoagulation:  Mechanical: Sequential compression devices, below knee Right lower extremity --high risk for DVTs due to body habitus and immobility. Will check dopplers in am. 2/17- pt has extensive DVTs in RLE and some in LLE- as above- placed on tx dose lovenox- will check CBC tomorrow due to risk of new GI bleed              -antiplatelet therapy: N/A 3. Pain: continue Oxycodone prn. 2/16- main pain is R shoulder- con't regimen prn  4. Mood: LCSW to follow for evaluation and support.              -antipsychotic agents:  Risperdal bid 5. Neuropsych: This patient is capable of making decisions on his own  behalf. 6. Skin/Wound Care: Air mattress due to sacral decub and necrotizing fasciitis LLE-              --continue vitamins and supplements to promote wound healing.  7. Fluids/Electrolytes/Nutrition:  Strict I/O as continues to show signs of overload/anasarca.             --low salt diet. Continue Juven to help with protein stores. Eating 50-100% meals will be able to reduce TF which should help hyperglycemia also drinking 2 cans ensure per day 8. Strep pyogenes bacteremia/Necrotizing fascitis LLE s/p I & D: Continue daily dressing changes.              --maintain adequate nutritional status to help promote healing. 9. Acute  reps failure s/p trach- and COPD: Respiratory status stable. Continue steriod taper  2/16- con't PMV and SLP for swallowing/speech/phonation 10 OSA/OHS?/VDRF: Continue button plugging during the day and ATC at nights for humidification             --Started discussion that he will likely go home with trach.  11. H/o GIB: H/H improving. Continue PPI BID.              --no signs of bleeding. Will start low dose Lovenox if platelets stable.   Monitor CBC on anticoagulation  CBC Latest Ref Rng & Units 07/18/2021 07/16/2021 07/14/2021  WBC 4.0 - 10.5 K/uL 3.4(L) 2.9(L) 3.9(L)  Hemoglobin 13.0 - 17.0 g/dL 9.1(L) 9.0(L) 9.5(L)  Hematocrit 39.0 - 52.0 % 29.0(L) 28.6(L) 29.9(L)  Platelets 150 - 400 K/uL PLATELET CLUMPS NOTED ON SMEAR, UNABLE TO ESTIMATE 166 162   Stable 2/19 12. Thrombocytopenia: Platelets continue to fluctuate.  --Will recheck CBC in am to decide on initiation of AC  2/16- Plts up to 166k 13.  T2DM:  Check Hgb A1c in am. Will monitor BS ac/hs with SSI for elevated BS             Add 2U Semglee HS             --continue tube feeds at nights (likely contributing to diarrhea)     2/17- Pt CBGs are running 200sto300s- will increase Semglee to 6 units nightly- from 2 units and monitor for trend CBG (last 3)  Recent Labs    07/18/21 1703 07/18/21 2048  07/19/21 0613  GLUCAP 223* 342* 421*   Increase semglee to 20U change SSI to Moderate   14. R RTC injury- probably partial tear-   2/16- sustained in [facility]- will write note above HOB to remind staff to protect R shoulder.  15. Extensive B/L DVTs-   2/17- will check CBC this weekend, since recent hx of GI bleed. Don't want pt having PE due to trach and resp failure requiring trach.     LOS: 4 days A FACE TO FACE EVALUATION WAS PERFORMED  Charlett Blake 07/19/2021, 10:48 AM

## 2021-07-19 NOTE — Progress Notes (Signed)
Occupational Therapy Session Note  Patient Details  Name: Tony Young MRN: [id number] Date of Birth: [birth year]  Today's Date: 07/20/2021 OT Individual Time: 1002-1101 OT Individual Time Calculation (min): 59 min   Short Term Goals: Week 1:  OT Short Term Goal 1 (Week 1): Patient will compelte UB bathing with Max A in supine. OT Short Term Goal 2 (Week 1): Patient will complete UB dressing wtih Max A in supine. OT Short Term Goal 3 (Week 1): Patient will maintain static sitting balance with Mod A seated EOB in prep for ADLs. OT Short Term Goal 4 (Week 1): Patient will complete sit to stand transfer with Max A +2 and LRAD.  Skilled Therapeutic Interventions/Progress Updates:    Pt greeted in bed, receiving medication from RN. C/o pain on buttocks and positioning strategies used during tx to address therapeutically. He wanted to engage in bathing/dressing tasks this AM. OT and RT assisted with setup and then pt reported the need to urgently void bladder. Before OT could set him up with urinal pt reported that he was "already going." Pt then reported that he was beginning to actively have a BM. It took him a considerable amount of time with [facility] raised to finish his BM. At this time, OT and RT assisted with washing lower legs, instructed pt to assist with lifting LEs as needed for strengthening purposes. Pt required Max A of 1 to roll Rt>Lt while 2nd helper assisted with pericare and brief change. +2 for donning pants, pt set up to bridge however unable to clear buttocks enough to fully elevate pants this way, needed to roll once again with the same assistance required as written above. With HOB elevated, +2 for doffing soiled shirt. Pt required hand over hand to assist with washing UB using the Lt UE. Noted sharp break of plastic on pts PEG tube and some bleeding still from IV draw. Informed RN and Teaching laboratory technician. Total A to don hospital gown. Pt left in care of hydrotherapists. Tx focus  placed on functional bed mobility, UE/LE strengthening, and activity tolerance.   Therapy Documentation Precautions:  Precautions Precautions: Fall Precaution Comments: trach, PEG, unable to read, fragile skin Restrictions Weight Bearing Restrictions: No Vital Signs: Oxygen Therapy SpO2: 95 % O2 Device: Room Air Pain: Pain Assessment Pain Scale: 0-10 Pain Score: 5  Pain Type: Acute pain Pain Location: Buttocks Pain Orientation: Medial Pain Descriptors / Indicators: Aching Pain Intervention(s): Medication (See eMAR) ADL: ADL Eating: Maximal assistance Where Assessed-Eating: Bed level Grooming: Maximal assistance Where Assessed-Grooming: Bed level Upper Body Bathing: Maximal assistance Where Assessed-Upper Body Bathing: Bed level Lower Body Bathing: Dependent Where Assessed-Lower Body Bathing: Bed level Upper Body Dressing: Dependent Where Assessed-Upper Body Dressing: Bed level Lower Body Dressing: Dependent Where Assessed-Lower Body Dressing: Bed level Toileting: Dependent Where Assessed-Toileting: Bed level Toilet Transfer Method: Not assessed  Therapy/Group: Individual Therapy  Danissa Rundle A Danie Hannig 07/20/2021, 12:40 PM

## 2021-07-19 NOTE — Progress Notes (Signed)
Occupational Therapy Session Note  Patient Details  Name: Tony Young MRN: [id number] Date of Birth: [birth year]  Today's Date: 07/19/2021 OT Individual Time: 1030-1045 OT Individual Time Calculation (min): 15 min  and Today's Date: 07/19/2021 OT Missed Time: 52 Minutes Missed Time Reason: Patient fatigue   Short Term Goals: Week 1:  OT Short Term Goal 1 (Week 1): Patient will compelte UB bathing with Max A in supine. OT Short Term Goal 2 (Week 1): Patient will complete UB dressing wtih Max A in supine. OT Short Term Goal 3 (Week 1): Patient will maintain static sitting balance with Mod A seated EOB in prep for ADLs. OT Short Term Goal 4 (Week 1): Patient will complete sit to stand transfer with Max A +2 and LRAD.  Skilled Therapeutic Interventions/Progress Updates:    Pt resting in bed upon arrival with son present. Pt states he is tired. Attempted to engaged pt in BUE therex with HOB @30 *. Pt unable to raise BUE past 45* with trace grasp. Pt states he just needs to rest. Pt with elevated CBG earlier but hasn't been rechecked. Pt issued yellow and pink foam blocks for hand strengthening. Pt missed 45 mins skilled OT services.   Therapy Documentation Precautions:  Precautions Precautions: Fall Precaution Comments: trach, PEG, unable to read, fragile skin Restrictions Weight Bearing Restrictions: No General: General OT Amount of Missed Time: 45 Minutes Pain: Pt denies pain this morning   Therapy/Group: Individual Therapy  Leroy Libman 07/19/2021, 11:09 AM

## 2021-07-19 NOTE — Progress Notes (Signed)
Physical Therapy Session Note  Patient Details  Name: Tony Young MRN: [id number] Date of Birth: 1955-07-12  Today's Date: 07/19/2021 PT Individual Time: 1345-1450 PT Individual Time Calculation (min): 65 min   Short Term Goals: Week 1:  PT Short Term Goal 1 (Week 1): Pt will complete bed mobility with maxA of 1 person PT Short Term Goal 2 (Week 1): Pt will complete bed<>chair transfer with maxA +2 PT Short Term Goal 3 (Week 1): Pt will complete sit<>stand transfer with maxA +2 PT Short Term Goal 4 (Week 1): Pt will tolerate sitting in TIS w/c for >2 hrs outside of therapy  Skilled Therapeutic Interventions/Progress Updates:    Pt initially supine, son at bedside, agreeable to session stating "I will do anything to get the hell out of this place!" 02 sats remain >94% throughout session.  Pt requests assist w/urinal.  Total assist, pt unable to manage urinal in supine due to weakness.  Unable to void.   Pt rolled L/R for brief/bedding change w/max assist of 2and cues for AAROM of Les for hooklying. Poor ability to use RUE w/R shoulder dysfunction, very weak LUE. Noted LLE dressing saturated and heavily weaping thru acewrap.  Nursing notified of need for dressing change at end of session.  Supine to sit on edge of bed w/max assist of 2, cues for sequencing, able to move Les toward edge w/min assist during transition.   Pt initially max assist due to heavy post lean but this fades to mod assist and at times min assist and use of L bedrail. Pt engaged in bathing and dresssing w/total assist to doff shirt, bathe back, chest, and face, and don clean shirt while maintaining sitting balance w/max assist.  Able to perform RLE LAQ, LLE partial knee extension w/mod assist for balance.  Worked on static sitting balance/tolerance w/feet on floor w/mod to min assist at times w/L bedrail.  Therapist obtained stedy and pt max assist for positioning feet /hands on [place].  Pt educated on use of  device for standing/transfers.  However, pt becoming quite fatigued and having difficulty holding head up or grasping Stedy.  States "I have to lay down now!" And attempts to lie down in unsafe/hurried manner.total assist of 2 to safely return supine.  Scoots in bed w/max assist of 2, cues and active assist to position Les and LUE to assist. Pt again required brief change, performed as above.   Pt left supine w/rails up x 3, alarm set, bed in lowest position, and needs in reach. Pt missed 10 min scheduled time due to fatigue.  If pressure wounds permit, would attempt transfers w/maximove due to global weakness.     Therapy Documentation Precautions:  Precautions Precautions: Fall Precaution Comments: trach, PEG, unable to read, fragile skin Restrictions Weight Bearing Restrictions: No   Therapy/Group: Individual Therapy Callie Fielding, Emily 07/19/2021, 3:09 PM

## 2021-07-19 NOTE — Progress Notes (Signed)
Pt 6am CBG result 421. MD made aware. New orders placed for stat serum glucose.   Tony Young

## 2021-07-19 NOTE — Progress Notes (Signed)
ANTICOAGULATION CONSULT NOTE - Follow-up Consult  Pharmacy Consult for Lovenox Indication: DVT  Allergies  Allergen Reactions   Statins Other (See Comments)    Made hands swell   Penicillins Nausea And Vomiting and Other (See Comments)    Other reaction(s): GI intolerance Other reaction(s): Nausea And Vomiting, Other (See Comments), Other (see comments) Other reaction(s): Other (See Comments) Other reaction(s): Nausea And Vomiting, Other (See Comments), Other (see comments) Other reaction(s): Nausea And Vomiting, Other (See Comments), Other (see comments)    Ropinirole Other (See Comments)    Patient Measurements: Height: 5\' 7"  (170.2 cm) Weight: 116.4 kg (256 lb 9.9 oz) IBW/kg (Calculated) : 66.1 Heparin Dosing Weight:   Vital Signs: Temp: 97.9 F (36.6 C) (02/19 0746) Temp Source: Oral (02/19 0746) BP: 112/68 (02/19 0746) Pulse Rate: 90 (02/19 0746)  Labs: Recent Labs    07/18/21 0552  HGB 9.1*  HCT 29.0*  PLT PLATELET CLUMPS NOTED ON SMEAR, UNABLE TO ESTIMATE     Estimated Creatinine Clearance: 74.8 mL/min (by C-G formula based on SCr of 1.2 mg/dL).   Medical History: Past Medical History:  Diagnosis Date   CAD (coronary artery disease)    Cardiomyopathy ([place])    Colon cancer (H[facility])    COPD (chronic obstructive pulmonary disease) (H[facility])    OSA (obstructive sleep apnea)    Type 2 diabetes mellitus ([place])     Assessment: 38 YOM with complicated medical hx who was recently a [facility] before [facility]. He was then admitted to CIR. He had some recent hx of GIB. Dopplers showed DVT today. Lovenox was started for anticoagulation.   Hgb 9 and stable on last CBC (2/16), platelets within normal limits. Renal function still appears to remain at baseline with Scr 1.2.  Goal of Therapy:  Anti-Xa level 0.6-1 units/ml 4hrs after LMWH dose given Monitor platelets by anticoagulation protocol: Yes   Plan:  Continue Lovenox 120mg  SQ  BID Weekly CBC (next due 2/20) Monitor signs and symptoms of bleeding F/u oral AC plan  Thank you for involving pharmacy in this patient's care.  Elita Quick, PharmD PGY1 Ambulatory Care Pharmacy Resident 07/19/2021 9:20 AM  **Pharmacist phone directory can be found on [email or web address] listed under [place]**

## 2021-07-20 DIAGNOSIS — G7281 Critical illness myopathy: Secondary | ICD-10-CM | POA: Diagnosis not present

## 2021-07-20 LAB — BASIC METABOLIC PANEL
Anion gap: 11 (ref 5–15)
BUN: 19 mg/dL (ref 8–23)
CO2: 25 mmol/L (ref 22–32)
Calcium: 8.3 mg/dL — ABNORMAL LOW (ref 8.9–10.3)
Chloride: 96 mmol/L — ABNORMAL LOW (ref 98–111)
Creatinine, Ser: 1.16 mg/dL (ref 0.61–1.24)
GFR, Estimated: >60 mL/min (ref >60)
Glucose, Bld: 336 mg/dL — ABNORMAL HIGH (ref 70–99)
Potassium: 3.9 mmol/L (ref 3.5–5.1)
Sodium: 132 mmol/L — ABNORMAL LOW (ref 135–145)

## 2021-07-20 LAB — CBC WITH DIFFERENTIAL/PLATELET
Abs Immature Granulocytes: 0.08 10*3/uL — ABNORMAL HIGH (ref 0.00–0.07)
Basophils Absolute: 0 10*3/uL (ref 0.0–0.1)
Basophils Relative: 0 %
Eosinophils Absolute: 0.1 10*3/uL (ref 0.0–0.5)
Eosinophils Relative: 1 %
HCT: 27.7 % — ABNORMAL LOW (ref 39.0–52.0)
Hemoglobin: 8.9 g/dL — ABNORMAL LOW (ref 13.0–17.0)
Immature Granulocytes: 2 %
Lymphocytes Relative: 16 %
Lymphs Abs: 0.6 10*3/uL — ABNORMAL LOW (ref 0.7–4.0)
MCH: 28.5 pg (ref 26.0–34.0)
MCHC: 32.1 g/dL (ref 30.0–36.0)
MCV: 88.8 fL (ref 80.0–100.0)
Monocytes Absolute: 0.4 10*3/uL (ref 0.1–1.0)
Monocytes Relative: 11 %
Neutro Abs: 2.4 10*3/uL (ref 1.7–7.7)
Neutrophils Relative %: 70 %
Platelets: 111 10*3/uL — ABNORMAL LOW (ref 150–400)
RBC: 3.12 MIL/uL — ABNORMAL LOW (ref 4.22–5.81)
RDW: 16.2 % — ABNORMAL HIGH (ref 11.5–15.5)
WBC: 3.5 10*3/uL — ABNORMAL LOW (ref 4.0–10.5)
nRBC: 0 % (ref 0.0–0.2)

## 2021-07-20 LAB — GLUCOSE, CAPILLARY
Glucose-Capillary: 313 mg/dL — ABNORMAL HIGH (ref 70–99)
Glucose-Capillary: 285 mg/dL — ABNORMAL HIGH (ref 70–99)
Glucose-Capillary: 241 mg/dL — ABNORMAL HIGH (ref 70–99)
Glucose-Capillary: 255 mg/dL — ABNORMAL HIGH (ref 70–99)
Glucose-Capillary: 332 mg/dL — ABNORMAL HIGH (ref 70–99)

## 2021-07-20 NOTE — Progress Notes (Signed)
Speech Language Pathology Daily Session Note ° °Patient Details  °Name: Tony Young °MRN: [id number] °Date of Birth: 01/25/1956 ° °Today's Date: 07/20/2021 °SLP Individual Time: 1430-1500 and 840-910 °SLP Individual Time Calculation (min): 30 min and 30 min ° °Short Term Goals: °Week 1: SLP Short Term Goal 1 (Week 1): Patient will tolerate current diet with min A verbal cues for implementing safe swallowing precautions and strategies (e.g., no straws, single sips, small bites, slow rate). °SLP Short Term Goal 2 (Week 1): Patient will consume dys 3 trials with effective mastication, minimal oral residuals, and without overt s/sx of aspiration with min A verbal cues to implement safe swallow precautions and strategies prior to diet advancement °SLP Short Term Goal 3 (Week 1): Patient will participate in further cognitive-linguistic assessment °SLP Short Term Goal 3 - Progress (Week 1): Met °SLP Short Term Goal 4 (Week 1): Patient will utilize external/internal memory aids to recall important information with minA. ° °Skilled Therapeutic Interventions: °#1 Skilled ST services focused on swallow skills. SLP attempted to set up breakfast tray, however refused to utilize provale cup and requested to use a straw. Pt became increasingly upset after SLP provided education pertaining to signs posted in room ( no straw and use of provale cup.)  SLP conducted chart review via Epic and was unable to read SLP reports but review MBS images from most recent study. Pt demonstrated no penetration nor aspiration on thin liquids, SLP only noted mild pharyngeal residue with most substances, which appeared to clear with consecutive or dry swallows. SLP provided PO consumption of thin liquids via straw (with PMSV donned) pt demonstrated small but consecutive sips with no overt s/s aspiration. RR and HR remained within normal limites. SLP educated pt to use small sips with a brief pause between sips, along with the use of a  intermittent dry swallow. SLP updated order/signs to allow straws when consuming thin liquids. Recommend to continue ST services.  ° °#2 Skilled ST services focused on swallow skills. SLP provided dys 1 textures and thin liquid via straw snack per pt request,. He politely decline dys 2 and dys 3 texture snacks. SLP positioned near 90 degrees in bed and provided total A for PO consumption. Pt demonstrated x2 delayed coughs when consuming 4oz of thin liquids via straw. SLP instructed in small sips and intermittent dry swallows, which appeared to reduce s/s aspiration. Pt was positioned to rest and recommend to continue ST services. °   ° °Pain °Pain Assessment °Pain Score: 0-No pain ° °Therapy/Group: Individual Therapy ° °   °07/20/2021, 3:51 PM °

## 2021-07-20 NOTE — Progress Notes (Signed)
PROGRESS NOTE   Subjective/Complaints:  Pt reports he's cold- per nursing, no secretions from trach- tolerating well-  Also nursing concerned that LLE has more odor/and dressing saturated this AM.   It's not clear if trach was changed out based on note- d/w resp therapy- waiting to hear back so can go forward  on plan.    ROS:  Pt denies SOB, abd pain, CP, N/V/C/D, and vision changes    Objective:   No results found. Recent Labs    07/18/21 0552 07/20/21 0555  WBC 3.4* 3.5*  HGB 9.1* 8.9*  HCT 29.0* 27.7*  PLT PLATELET CLUMPS NOTED ON SMEAR, UNABLE TO ESTIMATE 111*   Recent Labs    07/19/21 0723 07/20/21 0555  NA  --  132*  K  --  3.9  CL  --  96*  CO2  --  25  GLUCOSE 431* 336*  BUN  --  19  CREATININE  --  1.16  CALCIUM  --  8.3*    Intake/Output Summary (Last 24 hours) at 07/20/2021 3419 Last data filed at 07/20/2021 0715 Gross per 24 hour  Intake 480 ml  Output 550 ml  Net -70 ml     Pressure Injury 07/15/21 Buttocks Left Unstageable - Full thickness tissue loss in which the base of the injury is covered by slough (yellow, tan, gray, green or brown) and/or eschar (tan, brown or black) in the wound bed. (Active)  07/15/21 1520  Location: Buttocks  Location Orientation: Left  Staging: Unstageable - Full thickness tissue loss in which the base of the injury is covered by slough (yellow, tan, gray, green or brown) and/or eschar (tan, brown or black) in the wound bed.  Wound Description (Comments):   Present on Admission: Yes    Physical Exam: Vital Signs Blood pressure 101/79, pulse (!) 106, temperature 98.4 F (36.9 C), resp. rate 14, height 5\' 7"  (1.702 m), weight 116.4 kg, SpO2 98 %.     General: awake, alert, appropriate, supine in bed- nursing changing his LLE dressing; NAD HENT: conjugate gaze; oropharynx moist- trahc in place with PMV in place- no O2- sats 97% CV: tachycardic rate; in  100s; no JVD Pulmonary: CTA B/L; no W/R/R- good air movement GI: soft, NT, ND, (+)BS Psychiatric: appropriate Neurological: Ox3 Musculoskeletal:   R shoulder (+) impingement vs partial RTC tear on R shoulder- Also TTP posteriorly.       General: Swelling present.     Comments: 2-3+ edema LLE. Dependent edema bilateral forearms R>L.   Skin:    General: Skin is warm and dry.     Comments: Multiple scabs on bilateral hands. LLE with bulking dressing and dried serous drainage-  .- a lot of ecchymoses in arms B/L    Neurological:     Mental Status: He is alert and oriented to person, place, and time.     Comments: Speech mildly dysarthric without dysphonia. Able to follow simple motor commands without difficulty. RUE weakness noted. Tends to keep LLE rotated outwards      Assessment/Plan: 1. Functional deficits which require 3+ hours per day of interdisciplinary therapy in a comprehensive inpatient rehab setting. Physiatrist is providing close  team supervision and 24 hour management of active medical problems listed below. Physiatrist and rehab team continue to assess barriers to discharge/monitor patient progress toward functional and medical goals  Care Tool:  Bathing    Body parts bathed by patient: Face   Body parts bathed by helper: Right arm, Left arm, Chest, Abdomen, Front perineal area, Buttocks, Right upper leg, Left upper leg, Right lower leg Body parts n/a: Left lower leg   Bathing assist Assist Level: Total Assistance - Patient < 25%     Upper Body Dressing/Undressing Upper body dressing   What is the patient wearing?: Pull over shirt    Upper body assist Assist Level: Total Assistance - Patient < 25%    Lower Body Dressing/Undressing Lower body dressing      What is the patient wearing?: Pants, Underwear/pull up     Lower body assist Assist for lower body dressing: 2 Helpers     Toileting Toileting    Toileting assist Assist for toileting: Total  Assistance - Patient < 25%     Transfers Chair/bed transfer  Transfers assist  Chair/bed transfer activity did not occur: Safety/medical concerns        Locomotion Ambulation   Ambulation assist   Ambulation activity did not occur: Safety/medical concerns          Walk 10 feet activity   Assist  Walk 10 feet activity did not occur: Safety/medical concerns        Walk 50 feet activity   Assist Walk 50 feet with 2 turns activity did not occur: Safety/medical concerns         Walk 150 feet activity   Assist Walk 150 feet activity did not occur: Safety/medical concerns         Walk 10 feet on uneven surface  activity   Assist Walk 10 feet on uneven surfaces activity did not occur: Safety/medical concerns         Wheelchair     Assist Is the patient using a wheelchair?: Yes Type of Wheelchair: Manual Wheelchair activity did not occur: Safety/medical concerns         Wheelchair 50 feet with 2 turns activity    Assist    Wheelchair 50 feet with 2 turns activity did not occur: Safety/medical concerns       Wheelchair 150 feet activity     Assist  Wheelchair 150 feet activity did not occur: Safety/medical concerns       Blood pressure 101/79, pulse (!) 106, temperature 98.4 F (36.9 C), resp. rate 14, height 5\' 7"  (1.702 m), weight 116.4 kg, SpO2 98 %.  Medical Problem List and Plan: 1. Functional deficits secondary to critical illness myopathy             -patient may shower             -ELOS/Goals: modA 20 days            con't PT and OT and SLP for trach- CIR 2.  Antithrombotics: -DVT/anticoagulation:  Mechanical: Sequential compression devices, below knee Right lower extremity --high risk for DVTs due to body habitus and immobility. Will check dopplers in am. 2/17- pt has extensive DVTs in RLE and some in LLE- as above- placed on tx dose lovenox- will check CBC tomorrow due to risk of new GI bleed   2/20- Plts low  100s, however Hb stable- con't regimen- cannot place on DOAC_ will need Warfarin when change over per pharmacy             -  antiplatelet therapy: N/A 3. Pain: continue Oxycodone prn. 2/16- main pain is R shoulder- con't regimen prn  4. Mood: LCSW to follow for evaluation and support.              -antipsychotic agents:  Risperdal bid 5. Neuropsych: This patient is capable of making decisions on his own behalf. 6. Skin/Wound Care: Air mattress due to sacral decub and necrotizing fasciitis LLE-              --continue vitamins and supplements to promote wound healing.  7. Fluids/Electrolytes/Nutrition:  Strict I/O as continues to show signs of overload/anasarca.             --low salt diet. Continue Juven to help with protein stores. Eating 50-100% meals will be able to reduce TF which should help hyperglycemia also drinking 2 cans ensure per day 8. Strep pyogenes bacteremia/Necrotizing fascitis LLE s/p I & D: Continue daily dressing changes.              --maintain adequate nutritional status to help promote healing. 9. Acute  reps failure s/p trach- and COPD: Respiratory status stable. Continue steriod taper  2/16- con't PMV and SLP for swallowing/speech/phonation  2/20- talking with Resp therapy if can downsize trach- I think we likely can to #4, however will need to discuss directly with Resp therapy.  10 OSA/OHS?/VDRF: Continue button plugging during the day and ATC at nights for humidification             --Started discussion that he will likely go home with trach.  11. H/o GIB: H/H improving. Continue PPI BID.              --no signs of bleeding. Will start low dose Lovenox if platelets stable.   Monitor CBC on anticoagulation  CBC Latest Ref Rng & Units 07/20/2021 07/18/2021 07/16/2021  WBC 4.0 - 10.5 K/uL 3.5(L) 3.4(L) 2.9(L)  Hemoglobin 13.0 - 17.0 g/dL 8.9(L) 9.1(L) 9.0(L)  Hematocrit 39.0 - 52.0 % 27.7(L) 29.0(L) 28.6(L)  Platelets 150 - 400 K/uL 111(L) PLATELET CLUMPS NOTED ON  SMEAR, UNABLE TO ESTIMATE 166    2/20- Hb stable- con't to monitor 2x/week 12. Thrombocytopenia: Platelets continue to fluctuate.  --Will recheck CBC in am to decide on initiation of [facility]  2/16- Plts up to 166k  2/20- Plts down to 111k- will recheck Thursday 13.  T2DM:  Check Hgb A1c in am. Will monitor BS ac/hs with SSI for elevated BS             Add 2U Semglee HS             --continue tube feeds at nights (likely contributing to diarrhea)     2/17- Pt CBGs are running 200sto300s- will increase Semglee to 6 units nightly- from 2 units and monitor for trend CBG (last 3)  Recent Labs    07/19/21 2135 07/20/21 0618 07/20/21 0754  GLUCAP 319* 313* 285*  Increase semglee to 20U change SSI to Moderate   2/20- just got increased yesterday- wait to change meds for 24 hours- in midst of calorie count to see if can stop TF's.  14. R RTC injury- probably partial tear-   2/16- sustained in [facility]- will write note above HOB to remind staff to protect R shoulder.  15. Extensive B/L DVTs-   2/17- will check CBC this weekend, since recent hx of GI bleed. Don't want pt having PE due to trach and resp failure requiring trach.    I  spent a total of 51    minutes on total care today- >50% coordination of care- due to prolonged time in room assessing LLE- d/w 2 nurses and resp therapy.      LOS: 5 days A FACE TO FACE EVALUATION WAS PERFORMED  Layliana Devins 07/20/2021, 9:04 AM

## 2021-07-20 NOTE — Progress Notes (Signed)
Physical Therapy Wound Treatment Patient Details  Name: Tony Young MRN: [id number] Date of Birth: [birth year]  Today's Date: 07/20/2021 PT Individual Time: 1100-1132 PT Individual Time Calculation (min): 32 min   Subjective  Subjective: Pt agreeable to hydrotherapy Patient and Family Stated Goals: Heal wound Date of Onset:  (Unknown) Prior Treatments: Dressing changes  Pain Score:  Pt reports mild pain but overall tolerated treatment well.   Wound Assessment  Pressure Injury 07/15/21 Buttocks Left Unstageable - Full thickness tissue loss in which the base of the injury is covered by slough (yellow, tan, gray, green or brown) and/or eschar (tan, brown or black) in the wound bed. (Active)  Dressing Type Foam - Lift dressing to assess site every shift;Gauze (Comment);Santyl;Moist to moist 07/20/21 1400  Dressing Changed;Clean, Dry, Intact 07/20/21 1400  Dressing Change Frequency Daily 07/20/21 1400  State of Healing Eschar 07/20/21 1400  Site / Wound Assessment Pink;Yellow;Red;Bleeding;Painful 07/20/21 1400  % Wound base Red or Granulating 5% 07/20/21 1400  % Wound base Yellow/Fibrinous Exudate 85% 07/20/21 1400  % Wound base Black/Eschar 10% 07/20/21 1400  % Wound base Other/Granulation Tissue (Comment) 0% 07/20/21 1400  Peri-wound Assessment Intact 07/20/21 1400  Wound Length (cm) 7.2 cm 07/17/21 1518  Wound Width (cm) 7 cm 07/17/21 1518  Wound Depth (cm) 0.2 cm 07/17/21 1518  Wound Surface Area (cm^2) 50.4 cm^2 07/17/21 1518  Wound Volume (cm^3) 10.08 cm^3 07/17/21 1518  Tunneling (cm) 0 07/17/21 1518  Undermining (cm) 0 07/17/21 1518  Margins Unattached edges (unapproximated) 07/20/21 1400  Drainage Amount Minimal 07/20/21 1400  Drainage Description Serosanguineous 07/20/21 1400  Treatment Debridement (Selective);Hydrotherapy (Pulse lavage);Packing (Saline gauze) 07/20/21 1400      Hydrotherapy Pulsed lavage therapy - wound location: L buttock Pulsed Lavage with  Suction (psi): 12 psi Pulsed Lavage with Suction - Normal Saline Used: 1000 mL Pulsed Lavage Tip: Tip with splash shield Selective Debridement Selective Debridement - Location: L buttock Selective Debridement - Tools Used: Forceps;Scalpel Selective Debridement - Tissue Removed: yellow eschar,slough   Wound Assessment and Plan  Wound Therapy - Assess/Plan/Recommendations Wound Therapy - Clinical Statement: Progressing with debridement of adherent non-viable tissue. This patient will benefit from contiuned hydrotherapy for selective removal of necrotic tissue, to decrease bioburden, and promote wound healing. Wound Therapy - Functional Problem List: Immobility Factors Delaying/Impairing Wound Healing: Diabetes Mellitus;Immobility;Multiple medical problems;Polypharmacy Hydrotherapy Plan: Debridement;Dressing change;Patient/family education;Pulsatile lavage with suction Wound Therapy - Frequency: 6X / week Wound Therapy - Follow Up Recommendations: dressing changes by RN  Wound Therapy Goals- Improve the function of patient's integumentary system by progressing the wound(s) through the phases of wound healing (inflammation - proliferation - remodeling) by: Decrease Necrotic Tissue to: 20% Decrease Necrotic Tissue - Progress: Progressing toward goal Increase Granulation Tissue to: 80% Increase Granulation Tissue - Progress: Progressing toward goal Goals/treatment plan/discharge plan were made with and agreed upon by patient/family: Yes Time For Goal Achievement: 7 days Wound Therapy - Potential for Goals: Good  Goals will be updated until maximal potential achieved or discharge criteria met.  Discharge criteria: when goals achieved, discharge from hospital, MD decision/surgical intervention, no progress towards goals, refusal/missing three consecutive treatments without notification or medical reason.  GP     [name] 07/20/2021, 2:35 PM  Rolinda Roan, PT, DPT Acute  Rehabilitation Services Pager: [phone] Office: [phone]

## 2021-07-20 NOTE — Progress Notes (Signed)
Physical Therapy Session Note  Patient Details  Name: Nyeem Stoke MRN: [id number] Date of Birth: 08/30/1955  Today's Date: 07/20/2021 PT Individual Time: 1330-1410 PT Individual Time Calculation (min): 40 min   Short Term Goals: Week 1:  PT Short Term Goal 1 (Week 1): Pt will complete bed mobility with maxA of 1 person PT Short Term Goal 2 (Week 1): Pt will complete bed<>chair transfer with maxA +2 PT Short Term Goal 3 (Week 1): Pt will complete sit<>stand transfer with maxA +2 PT Short Term Goal 4 (Week 1): Pt will tolerate sitting in TIS w/c for >2 hrs outside of therapy  Skilled Therapeutic Interventions/Progress Updates:    pt received in bed and agreeable to therapy. Pt not wanting to get out of bed but agreeable to sitting EOB. Bed mobility mod x 2 with max VC and pt repeatedly saying "I can't" about tasks throughout session. Pt sat EOB with max A, frequently falling toward L side. Pt able to shift anterior to slight posterior lean for modified sit up, but not to self correct sitting balance. Pt then returned to bed with max A for trunk and LE management. Tot A x 2 for positioning in bed. Noted pt's LLE dressing soaked through, nsg in/out to reinforce dressing before positioning in L sidelying.Pt remained in sidelying and was left with all needs in reach and alarm active. 4 rails up per air bed protocol.   Therapy Documentation Precautions:  Precautions Precautions: Fall Precaution Comments: trach, PEG, unable to read, fragile skin Restrictions Weight Bearing Restrictions: No General:       Therapy/Group: Individual Therapy  Mickel Fuchs 07/20/2021, 1:57 PM

## 2021-07-20 NOTE — Progress Notes (Signed)
Inpatient Diabetes Program Recommendations  AACE/ADA: New Consensus Statement on Inpatient Glycemic Control (2015)  Target Ranges:  Prepandial:   less than 140 mg/dL      Peak postprandial:   less than 180 mg/dL (1-2 hours)      Critically ill patients:  140 - 180 mg/dL   Lab Results  Component Value Date   GLUCAP 241 (H) 07/20/2021   HGBA1C 5.9 (H) 07/16/2021    Review of Glycemic Control  Latest Reference Range & Units 07/19/21 16:33 07/19/21 21:35 07/20/21 06:18 07/20/21 07:54 07/20/21 11:49  Glucose-Capillary 70 - 99 mg/dL 253 (H) 319 (H) 313 (H) 285 (H) 241 (H)   Diabetes history: DM 2 Outpatient Diabetes medications:  Lantus 28 units bid, Humalog  Current orders for Inpatient glycemic control:  Novolog moderate tid with meals and HS Osmolite 50 ml/hr Semglee 20 units q HS  Inpatient Diabetes Program Recommendations:    Please consider changing Semglee to 15 units bid and increase Novolog correction to q 4 hours since patient is on tube feeds.   Thanks,  Adah Perl, RN, BC-ADM Inpatient Diabetes Coordinator Pager [phone]  (8a-5p)

## 2021-07-20 NOTE — Discharge Instructions (Signed)
Inpatient Rehab Discharge Instructions  Tony Young Discharge date and time:    Activities/Precautions/ Functional Status: Activity: no lifting, driving, or strenuous exercise for till cleared by Md Diet:  dysphagia 2 Wound Care: keep wound clean and dry   Functional status:  ___ No restrictions     ___ Walk up steps independently ___ 24/7 supervision/assistance   ___ Walk up steps with assistance ___ Intermittent supervision/assistance  ___ Bathe/dress independently ___ Walk with walker     ___ Bathe/dress with assistance ___ Walk Independently    ___ Shower independently ___ Walk with assistance    ___ Shower with assistance ___ No alcohol     ___ Return to work/school ________   Special Instructions:    My questions have been answered and I understand these instructions. I will adhere to these goals and the provided educational materials after my discharge from the hospital.  Patient/Caregiver Signature _______________________________ Date __________  Clinician Signature _______________________________________ Date __________  Please bring this form and your medication list with you to all your follow-up doctor's appointments.

## 2021-07-20 NOTE — Progress Notes (Addendum)
Calorie Count Note  48 hour calorie count ordered.  Diet: Dysphagia 2, thin liquids  Supplements:  Ensure BID each supplement provides 350 kcal and 20 grams of protein  Juven BID, each packet provides 95 calories, 2.5 grams of protein  Breakfast: 653 kcal, 26 grams of protein  Lunch: 435 kcal, 31 grams of protein Dinner: 581 kcal, 15 grams of protein  Supplements: 700 kcal, 40 grams of protein  Day 2 Total intake: 2369 kcal (100% of kcal needs)  112 grams of protein (100% of protein needs)  Estimated Nutritional Needs:  Kcal:  2250 - 2450 Protein:  110 - 125 grams Fluid:  >/= 2.2 L  Meal completion has been 70-100%. Pt currently has Ensure ordered and has been consuming them. Pt is meeting nutritional needs via PO, may discontinue nocturnal tube feeds.   Nutrition Dx:   Increased nutrient needs related to other (see comment) (critical illness myopathy) as evidenced by estimated needs; ongoing   Goal:  Patient will meet greater than or equal to 90% of their needs; met; met  Intervention:   - Continue Ensure Enlive po BID, each supplement provides 350 kcal and 20 grams of protein.   - Continue MVI daily    - Continue Juven BID, each packet provides 95 calories, 2.5 grams of protein.   - Pt is meeting nutritional needs via PO, may discontinue tube feeds.  Corrin Parker, MS, RD, LDN RD pager number/after hours weekend pager number on Amion.

## 2021-07-21 ENCOUNTER — Other Ambulatory Visit (HOSPITAL_COMMUNITY): Payer: Medicare Other

## 2021-07-21 ENCOUNTER — Inpatient Hospital Stay (HOSPITAL_COMMUNITY): Payer: Medicare Other

## 2021-07-21 DIAGNOSIS — S81802A Unspecified open wound, left lower leg, initial encounter: Secondary | ICD-10-CM

## 2021-07-21 DIAGNOSIS — G7281 Critical illness myopathy: Secondary | ICD-10-CM | POA: Diagnosis not present

## 2021-07-21 LAB — GLUCOSE, CAPILLARY
Glucose-Capillary: 359 mg/dL — ABNORMAL HIGH (ref 70–99)
Glucose-Capillary: 292 mg/dL — ABNORMAL HIGH (ref 70–99)
Glucose-Capillary: 269 mg/dL — ABNORMAL HIGH (ref 70–99)
Glucose-Capillary: 289 mg/dL — ABNORMAL HIGH (ref 70–99)

## 2021-07-21 MED ORDER — INSULIN GLARGINE-YFGN 100 UNIT/ML ~~LOC~~ SOLN
24.0000 [IU] | Freq: Every day | SUBCUTANEOUS | Status: DC
  Administered 2021-07-21 – 2021-07-28 (×8): 24 [IU] via SUBCUTANEOUS
  Filled 2021-07-21 (×11): qty 0.24

## 2021-07-21 MED ORDER — TRAMADOL HCL 50 MG PO TABS
50.0000 mg | ORAL_TABLET | Freq: Four times a day (QID) | ORAL | Status: DC
  Administered 2021-07-21 – 2021-07-29 (×26): 50 mg via ORAL
  Filled 2021-07-21 (×28): qty 1

## 2021-07-21 NOTE — Progress Notes (Signed)
PROGRESS NOTE   Subjective/Complaints: Pt upset- people waking him up to give meds and do things without turning on light or announcing themselves.  Also wants elbow and ankle pads- they are hurting- Per nurse didn't give pain meds overnight.   Pt willing to try some scheduled pain meds to help with pain control.      ROS:  Pt denies SOB, abd pain, CP, N/V/C/D, and vision changes     Objective:   No results found. Recent Labs    07/20/21 0555  WBC 3.5*  HGB 8.9*  HCT 27.7*  PLT 111*   Recent Labs    07/19/21 0723 07/20/21 0555  NA  --  132*  K  --  3.9  CL  --  96*  CO2  --  25  GLUCOSE 431* 336*  BUN  --  19  CREATININE  --  1.16  CALCIUM  --  8.3*    Intake/Output Summary (Last 24 hours) at 07/21/2021 5027 Last data filed at 07/21/2021 0045 Gross per 24 hour  Intake --  Output 300 ml  Net -300 ml     Pressure Injury 07/15/21 Buttocks Left Unstageable - Full thickness tissue loss in which the base of the injury is covered by slough (yellow, tan, gray, green or brown) and/or eschar (tan, brown or black) in the wound bed. (Active)  07/15/21 1520  Location: Buttocks  Location Orientation: Left  Staging: Unstageable - Full thickness tissue loss in which the base of the injury is covered by slough (yellow, tan, gray, green or brown) and/or eschar (tan, brown or black) in the wound bed.  Wound Description (Comments):   Present on Admission: Yes    Physical Exam: Vital Signs Blood pressure 108/69, pulse 98, temperature 98 F (36.7 C), temperature source Oral, resp. rate 18, height 5\' 7"  (1.702 m), weight (S) 116.2 kg, SpO2 99 %.      General: awake, alert, appropriate, laying in bed supine; NAD HENT: conjugate gaze; oropharynx moist- trach and PMV in place- talking well CV: regular rate; no JVD Pulmonary: CTA B/L; no W/R/R- good air movement- off O2- sounds great GI: soft, NT, ND,  (+)BS Psychiatric: appropriate- interactive Neurological: Ox3  Musculoskeletal:   R shoulder (+) impingement vs partial RTC tear on R shoulder- Also TTP posteriorly.       General: Swelling present.     Comments: 2-3+ edema LLE. Dependent edema bilateral forearms R>L.   Skin:    General: Skin is warm and dry.     Comments: Multiple scabs on bilateral hands. LLE with bulking dressing and dried serous drainage-  .- a lot of ecchymoses in arms B/L    LLE- looks less beefy and deeper red with clots that appear to be ON top of the tissue from bleeding as compared to in the tissue- from dressings? More slough as well Neurological:     Mental Status: He is alert and oriented to person, place, and time.     Comments: Speech mildly dysarthric without dysphonia. Able to follow simple motor commands without difficulty. RUE weakness noted. Tends to keep LLE rotated outwards    07/20/21  Assessment/Plan: 1. Functional deficits  which require 3+ hours per day of interdisciplinary therapy in a comprehensive inpatient rehab setting. Physiatrist is providing close team supervision and 24 hour management of active medical problems listed below. Physiatrist and rehab team continue to assess barriers to discharge/monitor patient progress toward functional and medical goals  Care Tool:  Bathing    Body parts bathed by patient: Face   Body parts bathed by helper: Right arm, Left arm, Chest, Abdomen, Front perineal area, Buttocks, Right upper leg, Left upper leg, Right lower leg, Left lower leg, Face Body parts n/a: Left lower leg   Bathing assist Assist Level: 2 Helpers     Upper Body Dressing/Undressing Upper body dressing   What is the patient wearing?: Hospital gown only    Upper body assist Assist Level: Total Assistance - Patient < 25%    Lower Body Dressing/Undressing Lower body dressing      What is the patient wearing?: Pants, Underwear/pull up     Lower body assist Assist for lower  body dressing: 2 Helpers     Toileting Toileting    Toileting assist Assist for toileting: 2 Helpers     Transfers Chair/bed transfer  Transfers assist  Chair/bed transfer activity did not occur: Safety/medical concerns        Locomotion Ambulation   Ambulation assist   Ambulation activity did not occur: Safety/medical concerns          Walk 10 feet activity   Assist  Walk 10 feet activity did not occur: Safety/medical concerns        Walk 50 feet activity   Assist Walk 50 feet with 2 turns activity did not occur: Safety/medical concerns         Walk 150 feet activity   Assist Walk 150 feet activity did not occur: Safety/medical concerns         Walk 10 feet on uneven surface  activity   Assist Walk 10 feet on uneven surfaces activity did not occur: Safety/medical concerns         Wheelchair     Assist Is the patient using a wheelchair?: Yes Type of Wheelchair: Manual Wheelchair activity did not occur: Safety/medical concerns         Wheelchair 50 feet with 2 turns activity    Assist    Wheelchair 50 feet with 2 turns activity did not occur: Safety/medical concerns       Wheelchair 150 feet activity     Assist  Wheelchair 150 feet activity did not occur: Safety/medical concerns       Blood pressure 108/69, pulse 98, temperature 98 F (36.7 C), temperature source Oral, resp. rate 18, height 5\' 7"  (1.702 m), weight (S) 116.2 kg, SpO2 99 %.  Medical Problem List and Plan: 1. 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  Antithrombotics: -DVT/anticoagulation:  Mechanical: Sequential compression devices, below knee Right lower extremity --high risk  for DVTs due to body habitus and immobility. Will check dopplers in am. 2/17- pt has extensive DVTs in RLE and some in LLE- as above- placed on tx dose lovenox- will check  CBC tomorrow due to risk of new GI bleed   2/20- Plts low 100s, however Hb stable- con't regimen- cannot place on DOAC_ will need Warfarin when change over per pharmacy             -antiplatelet therapy: N/A 3. Pain: continue Oxycodone prn. 2/16- main pain is R shoulder- con't regimen prn  4. Mood: LCSW to follow for evaluation and support.              -antipsychotic agents:  Risperdal bid 5. Neuropsych: This patient is capable of making decisions on his own behalf. 6. Skin/Wound Care: Air mattress due to sacral decub and necrotizing fasciitis LLE-              --continue vitamins and supplements to promote wound healing.  7. Fluids/Electrolytes/Nutrition:  Strict I/O as continues to show signs of overload/anasarca.             --low salt diet. Continue Juven to help with protein stores. Eating 50-100% meals will be able to reduce TF which should help hyperglycemia also drinking 2 cans ensure per day 8. Strep pyogenes bacteremia/Necrotizing fascitis LLE s/p I & D: Continue daily dressing changes.              --maintain adequate nutritional status to help promote healing. 9. Acute  reps failure s/p trach- and COPD: Respiratory status stable. Continue steriod taper  2/16- con't PMV and SLP for swallowing/speech/phonation  2/20- talking with Resp therapy if can downsize trach- I think we likely can to #4, however will need to discuss directly with Resp therapy.  10 OSA/OHS?/VDRF: Continue button plugging during the day and ATC at nights for humidification             --Started discussion that he will likely go home with trach.  11. H/o GIB: H/H improving. Continue PPI BID.              --no signs of bleeding. Will start low dose Lovenox if platelets stable.   Monitor CBC on anticoagulation  CBC Latest Ref Rng & Units  07/20/2021 07/18/2021 07/16/2021  WBC 4.0 - 10.5 K/uL 3.5(L) 3.4(L) 2.9(L)  Hemoglobin 13.0 - 17.0 g/dL 8.9(L) 9.1(L) 9.0(L)  Hematocrit 39.0 - 52.0 % 27.7(L) 29.0(L) 28.6(L)  Platelets 150 - 400 K/uL 111(L) PLATELET CLUMPS NOTED ON SMEAR, UNABLE TO ESTIMATE 166    2/20- Hb stable- con't to monitor 2x/week 12. Thrombocytopenia: Platelets continue to fluctuate.  --Will recheck CBC in am to decide on initiation of [facility]  2/16- Plts up to 166k  2/20- Plts down to 111k- will recheck Thursday 13.  T2DM:  Check Hgb A1c in am. Will monitor BS ac/hs with SSI for elevated BS             Add 2U Semglee HS             --continue tube feeds at nights (likely contributing to diarrhea)     2/17- Pt CBGs are running 200sto300s- will increase Semglee to 6 units nightly- from 2 units and monitor for trend CBG (last 3)  Recent Labs    07/20/21 1723 07/20/21 2140 07/21/21 0710  GLUCAP 255* 332* 359*  Increase semglee to 20U change SSI to Moderate   2/20- just got increased yesterday- wait to change meds for 24 hours- in midst of calorie count to see if can stop TF's.   2/21- will increase semglee to 24 units and maintain SSI- since CBGs are in 200s-300s-  14. R RTC injury- probably partial tear-   2/16- sustained in [facility]- will write note above HOB to remind staff to protect R shoulder.  15. Extensive B/L DVTs-   2/17- will check CBC this weekend, since recent hx of GI bleed. Don't want pt having PE due to trach and resp failure requiring trach.  16. Necrotizing fasciitis of LLE  2/21- looks less beefy and deeper red- concerning to me with more slough- called Gen surg, then ortho- they will look at pt, however might need Plastics--   I spent a total of  56  minutes on total care today- >50% coordination of care- due to d/w nursing, prolonged time assessing wound while nurse undressing wounds; team conference and multiple calls to specialists.       LOS: 6 days A FACE TO FACE EVALUATION WAS  PERFORMED  Ashaunti Treptow 07/21/2021, 8:34 AM

## 2021-07-21 NOTE — Consult Note (Signed)
Reason for Consult/CC:left lower extremity open wounds after necrotizing infection.  Tony Young is an 66 y.o. male.  HPI: 66 year old with left lower extremity open wounds after necrotizing infection.  He has had multiple surgeries to debride this area.  His medical history is very complicated but also notable for atrial fibrillation requiring anticoagulation as well as bilateral lower extremity deep vein thrombosis.  He is on therapeutic Lovenox at this time.  Allergies:  Allergies  Allergen Reactions   Statins Other (See Comments)    Made hands swell   Penicillins Nausea And Vomiting and Other (See Comments)    Other reaction(s): GI intolerance Other reaction(s): Nausea And Vomiting, Other (See Comments), Other (see comments) Other reaction(s): Other (See Comments) Other reaction(s): Nausea And Vomiting, Other (See Comments), Other (see comments) Other reaction(s): Nausea And Vomiting, Other (See Comments), Other (see comments)    Ropinirole Other (See Comments)    Medications:  Current Facility-Administered Medications:    acetaminophen (TYLENOL) tablet 325-650 mg, 325-650 mg, Oral, Q4H PRN, Love, Pamela S, PA-C, 650 mg at 07/21/21 1311   alum & mag hydroxide-simeth (MAALOX/MYLANTA) 200-200-20 MG/5ML suspension 30 mL, 30 mL, Oral, Q4H PRN, Love, Pamela S, PA-C   amantadine (SYMMETREL) 50 MG/5ML solution 50 mg, 50 mg, Oral, Daily, Love, Pamela S, PA-C, 50 mg at 07/21/21 0930   amiodarone (PACERONE) tablet 200 mg, 200 mg, Oral, BID, Tobb, Kardie, DO, 200 mg at 07/21/21 3212   ascorbic acid (VITAMIN C) tablet 500 mg, 500 mg, Oral, BID, Love, Pamela S, PA-C, 500 mg at 07/21/21 2482   bisacodyl (DULCOLAX) suppository 10 mg, 10 mg, Rectal, Daily PRN, Love, Pamela S, PA-C   busPIRone (BUSPAR) tablet 7.5 mg, 7.5 mg, Oral, BID, Love, Pamela S, PA-C, 7.5 mg at 07/21/21 5003   cholecalciferol (VITAMIN D3) tablet 1,000 Units, 1,000 Units, Oral, Daily, Love, Pamela S, PA-C, 1,000 Units  at 07/21/21 7048   collagenase (SANTYL) ointment, , Topical, Daily, Lovorn, Megan, MD, Given at 07/21/21 0932   diphenhydrAMINE (BENADRYL) 12.5 MG/5ML elixir 12.5-25 mg, 12.5-25 mg, Oral, Q6H PRN, Love, Pamela S, PA-C   enoxaparin (LOVENOX) injection 120 mg, 120 mg, Subcutaneous, BID, Pham, Minh Q, RPH-CPP, 120 mg at 07/21/21 0932   feeding supplement (ENSURE ENLIVE / ENSURE PLUS) liquid 237 mL, 237 mL, Oral, BID BM, Lovorn, Megan, MD, 237 mL at 07/21/21 1257   feeding supplement (OSMOLITE 1.5 CAL) liquid 900 mL, 900 mL, Per Tube, Q24H, Kirsteins, Luanna Salk, MD, Last Rate: 50 mL/hr at 07/21/21 1814, 900 mL at 07/21/21 1814   free water 100 mL, 100 mL, Per Tube, Q6H, Love, Pamela S, PA-C, 100 mL at 07/21/21 1749   guaiFENesin-dextromethorphan (ROBITUSSIN DM) 100-10 MG/5ML syrup 5-10 mL, 5-10 mL, Oral, Q6H PRN, Love, Pamela S, PA-C   insulin aspart (novoLOG) injection 0-15 Units, 0-15 Units, Subcutaneous, TID WC, Kirsteins, Luanna Salk, MD, 8 Units at 07/21/21 1750   insulin aspart (novoLOG) injection 0-5 Units, 0-5 Units, Subcutaneous, QHS, Love, Pamela S, PA-C, 4 Units at 07/20/21 2155   insulin glargine-yfgn (SEMGLEE) injection 24 Units, 24 Units, Subcutaneous, QHS, Lovorn, Megan, MD   ipratropium-albuterol (DUONEB) 0.5-2.5 (3) MG/3ML nebulizer solution 3 mL, 3 mL, Nebulization, Q4H PRN, Lovorn, Megan, MD   iron polysaccharides (NIFEREX) capsule 150 mg, 150 mg, Oral, QPC supper, Love, Pamela S, PA-C, 150 mg at 07/21/21 1749   latanoprost (XALATAN) 0.005 % ophthalmic solution 1 drop, 1 drop, Both Eyes, QHS, Love, Pamela S, PA-C, 1 drop at 07/20/21 2158  levothyroxine (SYNTHROID) tablet 75 mcg, 75 mcg, Oral, Q0600, Love, Pamela S, PA-C, 75 mcg at 07/21/21 0554   loratadine (CLARITIN) tablet 10 mg, 10 mg, Oral, Daily, Love, Pamela S, PA-C, 10 mg at 07/21/21 0930   melatonin tablet 3 mg, 3 mg, Oral, QHS, Love, Pamela S, PA-C, 3 mg at 07/20/21 2138   metoprolol tartrate (LOPRESSOR) tablet 50 mg, 50 mg,  Oral, BID, Love, Pamela S, PA-C, 50 mg at 07/21/21 1610   multivitamin with minerals tablet 1 tablet, 1 tablet, Oral, Daily, Lovorn, Megan, MD, 1 tablet at 07/21/21 0930   naphazoline-glycerin (CLEAR EYES REDNESS) ophth solution 1-2 drop, 1-2 drop, Both Eyes, QID PRN, Love, Pamela S, PA-C   nutrition supplement (JUVEN) (JUVEN) powder packet 1 packet, 1 packet, Per Tube, BID BM, Lovorn, Megan, MD, 1 packet at 07/21/21 1257   oxyCODONE (Oxy IR/ROXICODONE) immediate release tablet 5 mg, 5 mg, Oral, Q4H PRN, Love, Pamela S, PA-C, 5 mg at 07/21/21 1748   pantoprazole (PROTONIX) EC tablet 40 mg, 40 mg, Oral, BID AC, Love, Pamela S, PA-C, 40 mg at 07/21/21 1749   polycarbophil (FIBERCON) tablet 625 mg, 625 mg, Oral, BID, Love, Pamela S, PA-C, 625 mg at 07/21/21 9604   polyethylene glycol (MIRALAX / GLYCOLAX) packet 17 g, 17 g, Oral, Daily PRN, Love, Pamela S, PA-C   predniSONE (DELTASONE) tablet 10 mg, 10 mg, Oral, Q breakfast, Love, Pamela S, PA-C, 10 mg at 07/21/21 0931   pregabalin (LYRICA) capsule 50 mg, 50 mg, Oral, Daily, Love, Pamela S, PA-C, 50 mg at 07/21/21 0930   primidone (MYSOLINE) tablet 50 mg, 50 mg, Oral, Daily, Love, Pamela S, PA-C, 50 mg at 07/21/21 0930   prochlorperazine (COMPAZINE) tablet 5-10 mg, 5-10 mg, Oral, Q6H PRN **OR** prochlorperazine (COMPAZINE) injection 5-10 mg, 5-10 mg, Intramuscular, Q6H PRN **OR** prochlorperazine (COMPAZINE) suppository 12.5 mg, 12.5 mg, Rectal, Q6H PRN, Love, Pamela S, PA-C   risperiDONE (RISPERDAL) tablet 1 mg, 1 mg, Oral, BID, Love, Pamela S, PA-C, 1 mg at 07/21/21 0930   sertraline (ZOLOFT) tablet 100 mg, 100 mg, Oral, QHS, Love, Pamela S, PA-C, 100 mg at 07/20/21 2139   silver nitrate applicators applicator 1 application, 1 application, Topical, PRN, Lovorn, Megan, MD   sodium phosphate (FLEET) 7-19 GM/118ML enema 1 enema, 1 enema, Rectal, Once PRN, Love, Pamela S, PA-C   traMADol (ULTRAM) tablet 50 mg, 50 mg, Oral, Q6H, Lovorn, Megan, MD    traZODone (DESYREL) tablet 25-50 mg, 25-50 mg, Oral, QHS PRN, Love, Pamela S, PA-C   zinc sulfate capsule 220 mg, 220 mg, Oral, Daily, Love, Pamela S, PA-C, 220 mg at 07/21/21 5409  Past Medical History:  Diagnosis Date   CAD (coronary artery disease)    Cardiomyopathy ([place])    Colon cancer ([place])    COPD (chronic obstructive pulmonary disease) ([place])    OSA (obstructive sleep apnea)    Type 2 diabetes mellitus ([place])     Past Surgical History:  Procedure Laterality Date   COLON RESECTION     IR REMOVAL TUN CV CATH W/O FL  07/01/2021   PEG PLACEMENT  05/26/2021   TRACHEOSTOMY  05/26/2021    Family History  Problem Relation Age of Onset   Cancer Father    Diabetes Maternal Grandfather     Social History:  reports that he has never smoked. He has never used smokeless tobacco. He reports that he does not drink alcohol and does not use drugs.  Physical Exam Blood pressure 107/65, pulse Marland Kitchen)  104, temperature 97.8 F (36.6 C), temperature source Oral, resp. rate 16, height 5\' 7"  (1.702 m), weight (S) 116.2 kg, SpO2 100 %. General: Alert,, conversational. Extremity: Large left lower extremity wound with healthy granulation tissue.  Results for orders placed or performed during the hospital encounter of 07/15/21 (from the past 48 hour(s))  Glucose, capillary     Status: Abnormal   Collection Time: 07/19/21  9:35 PM  Result Value Ref Range   Glucose-Capillary 319 (H) 70 - 99 mg/dL    Comment: Glucose reference range applies only to samples taken after fasting for at least 8 hours.  Basic metabolic panel     Status: Abnormal   Collection Time: 07/20/21  5:55 AM  Result Value Ref Range   Sodium 132 (L) 135 - 145 mmol/L   Potassium 3.9 3.5 - 5.1 mmol/L   Chloride 96 (L) 98 - 111 mmol/L   CO2 25 22 - 32 mmol/L   Glucose, Bld 336 (H) 70 - 99 mg/dL    Comment: Glucose reference range applies only to samples taken after fasting for at least 8 hours.   BUN 19 8 - 23 mg/dL   Creatinine,  Ser 1.16 0.61 - 1.24 mg/dL   Calcium 8.3 (L) 8.9 - 10.3 mg/dL   GFR, Estimated >60 >60 mL/min    Comment: (NOTE) Calculated using the CKD-EPI Creatinine Equation (2021)    Anion gap 11 5 - 15    Comment: Performed at [facility] [address] [place], [place] [address]  CBC with Differential/Platelet     Status: Abnormal   Collection Time: 07/20/21  5:55 AM  Result Value Ref Range   WBC 3.5 (L) 4.0 - 10.5 K/uL   RBC 3.12 (L) 4.22 - 5.81 MIL/uL   Hemoglobin 8.9 (L) 13.0 - 17.0 g/dL   HCT 27.7 (L) 39.0 - 52.0 %   MCV 88.8 80.0 - 100.0 fL   MCH 28.5 26.0 - 34.0 pg   MCHC 32.1 30.0 - 36.0 g/dL   RDW 16.2 (H) 11.5 - 15.5 %   Platelets 111 (L) 150 - 400 K/uL    Comment: Immature Platelet Fraction may be clinically indicated, consider ordering this additional test [id number] REPEATED TO VERIFY PLATELET COUNT CONFIRMED BY SMEAR    nRBC 0.0 0.0 - 0.2 %   Neutrophils Relative % 70 %   Neutro Abs 2.4 1.7 - 7.7 K/uL   Lymphocytes Relative 16 %   Lymphs Abs 0.6 (L) 0.7 - 4.0 K/uL   Monocytes Relative 11 %   Monocytes Absolute 0.4 0.1 - 1.0 K/uL   Eosinophils Relative 1 %   Eosinophils Absolute 0.1 0.0 - 0.5 K/uL   Basophils Relative 0 %   Basophils Absolute 0.0 0.0 - 0.1 K/uL   Immature Granulocytes 2 %   Abs Immature Granulocytes 0.08 (H) 0.00 - 0.07 K/uL    Comment: Performed at [facility] [address] [place], [place] [address]  Glucose, capillary     Status: Abnormal   Collection Time: 07/20/21  6:18 AM  Result Value Ref Range   Glucose-Capillary 313 (H) 70 - 99 mg/dL    Comment: Glucose reference range applies only to samples taken after fasting for at least 8 hours.  Glucose, capillary     Status: Abnormal   Collection Time: 07/20/21  7:54 AM  Result Value Ref Range   Glucose-Capillary 285 (H) 70 - 99 mg/dL    Comment: Glucose reference range applies only to  samples taken after fasting for at least 8 hours.  Glucose, capillary     Status: Abnormal    Collection Time: 07/20/21 11:49 AM  Result Value Ref Range   Glucose-Capillary 241 (H) 70 - 99 mg/dL    Comment: Glucose reference range applies only to samples taken after fasting for at least 8 hours.  Glucose, capillary     Status: Abnormal   Collection Time: 07/20/21  5:23 PM  Result Value Ref Range   Glucose-Capillary 255 (H) 70 - 99 mg/dL    Comment: Glucose reference range applies only to samples taken after fasting for at least 8 hours.  Glucose, capillary     Status: Abnormal   Collection Time: 07/20/21  9:40 PM  Result Value Ref Range   Glucose-Capillary 332 (H) 70 - 99 mg/dL    Comment: Glucose reference range applies only to samples taken after fasting for at least 8 hours.  Glucose, capillary     Status: Abnormal   Collection Time: 07/21/21  7:10 AM  Result Value Ref Range   Glucose-Capillary 359 (H) 70 - 99 mg/dL    Comment: Glucose reference range applies only to samples taken after fasting for at least 8 hours.  Glucose, capillary     Status: Abnormal   Collection Time: 07/21/21 12:06 PM  Result Value Ref Range   Glucose-Capillary 292 (H) 70 - 99 mg/dL    Comment: Glucose reference range applies only to samples taken after fasting for at least 8 hours.  Glucose, capillary     Status: Abnormal   Collection Time: 07/21/21  4:29 PM  Result Value Ref Range   Glucose-Capillary 269 (H) 70 - 99 mg/dL    Comment: Glucose reference range applies only to samples taken after fasting for at least 8 hours.    DG Knee 1-2 Views Left  Result Date: 07/21/2021 CLINICAL DATA:  Left knee pain after attempting to walk in rehab. History of fall. EXAM: LEFT KNEE - 1-2 VIEW COMPARISON:  None. FINDINGS: Small knee joint effusion. No fracture or dislocation. No evidence of lipohemarthrosis. 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 No  evidence of chondrocalcinosis. Scattered adjacent vascular calcifications. No radiopaque foreign body. IMPRESSION: 1. Small knee joint effusion without associated fracture or lipohemarthrosis. 2. 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 Electronically Signed   By: Sandi Mariscal M.D.   On: 07/21/2021 16:56    Assessment/Plan: Patient has a large left lower extremity wound after necrotizing infection.  Operative reconstruction of this wound would a skin graft is indicated even in a patient with very significant comorbidities such as this patient.  Cardiology has seen him to assess his risk and has made some recommendations.  Following this we just need to plan for holding his anticoagulants for surgery which could be holding Lovenox or converting him to heparin drip and holding heparin for skin grafting of this rather large area.  Ward time:  52 minutes of ward time was spent face to face with this patient and reviewing his clinical records. Lennice Sites 07/21/2021, 8:22 PM

## 2021-07-21 NOTE — Consult Note (Addendum)
NAME:  Tony Young, MRN:  [id number], DOB:  Jan 08, 1956, LOS: 6 ADMISSION DATE:  07/15/2021, CONSULTATION DATE:  2/21 REFERRING MD:  Dagoberto Ligas, CHIEF COMPLAINT:  trach management    History of Present Illness:  This is a 66 year old male patient with history as mentioned below who was admitted to [facility] rehab center on 2/15 after a prolonged hospital stay due to necrotizing fasciitis involving the left lower extremity with associated strep pyogenes bacteremia.  His hospital stay was long and complicated and complicated by septic shock, requiring multiple incision and drainage of the left lower extremity on 12/15, 12/16, and 12/24 hours respiratory failure, renal failure, requiring CRRT, GI bleed from gastric ulcer requiring several blood transfusions, non-STEMI, volume overload, encephalopathy, shock liver, and ultimately tracheostomy to facilitate ventilator weaning and PEG placement both placed December 27.  He was eventually transferred to [facility] for ongoing ventilator weaning that stay was also complicated by ventilator associated pneumonia, COPD exacerbation, and atrial fibrillation with RVR.  He ultimately however improved to the point where he was able to tolerate aerosol trach collar by 1/25 2023 but required occasional diuresis for volume overload.  Over the course of his hospitalization he had extensive deconditioning from his prolonged critical illness, and was ultimately transferred to inpatient rehab after being successfully weaned off the ventilator. From a rehab standpoint he is making slow progress.  Continues to have dependent edema, left lower extremity open wound, in need of eventual skin graft, and fairly significant functional deficits requiring over 3 hours a day of physical therapy.  Since his admission to rehab his course has been complicated by bilateral lower extremity deep vein thrombosis for which she is now on therapeutic low molecular weight heparin.   Pulmonary has been asked to see in regards to recommendations for tracheostomy Pertinent  Medical History  OSA, COPD, CAD, CHF, colon cancer, T2DM, atrial fib, prior obstructive sleep apnea on CPAP    Significant Hospital Events: Including procedures, antibiotic start and stop dates in addition to other pertinent events   2/15 admitted to rehab 2/16 ultrasound lower extremities: Positive DVT right lower extremity, positive DVT left lower extremity.  Therapeutic low molecular weight heparin started.  Seen by cardiology for consultation to assist with treatment of atrial fibrillation 2/21 Pulmonary consulted for trach management  Interim History / Subjective:  Resting comfortably in bed, he does have lower extremity leg discomfort he is profoundly weak  Objective   Blood pressure 108/69, pulse 98, temperature 98 F (36.7 C), temperature source Oral, resp. rate 16, height 5\' 7"  (1.702 m), weight (Significant) 116.2 kg, SpO2 100 %.    FiO2 (%):  [21 %] 21 %   Intake/Output Summary (Last 24 hours) at 07/21/2021 1317 Last data filed at 07/21/2021 1230 Gross per 24 hour  Intake 600 ml  Output 300 ml  Net 300 ml   Filed Weights   07/15/21 1700 07/18/21 0350 07/21/21 0500  Weight: 116.1 kg 116.4 kg (Significant) 116.2 kg    Examination: General: Obese and debilitated 66 year old male who appears much older than stated age he is currently in no acute distress and on room air HENT: Normocephalic atraumatic neck is large and difficult to assess for JVD he has a size 6 tracheostomy in place PMV is also in place his phonation quality is strong Lungs: Diminished bilaterally no accessory use currently room air Cardiovascular: Regular irregular Abdomen: Large soft no organomegaly Extremities: Dependent edema scattered areas of ecchymosis and bruising left lower extremities wrapped  Neuro: Awake oriented no focal deficits  Resolved Hospital Problem list    Assessment & Plan:  Tracheostomy  dependence Chronic respiratory failure Obstructive sleep apnea Obesity hypoventilation syndrome History of tobacco abuse and COPD Severe physical deconditioning Obesity Atrial fibrillation Bilateral lower extremity DVT Diabetes type 2, insulin-dependent, with hyperglycemia Anemia of critical illness Recent necrotizing fasciitis involving the left lower extremity status post multiple I&D.  Essentially has open Circumferential tissue loss, also unstageable pressure ulcer on the sacrum and bilateral upper gluteal muscle Protein calorie malnutrition   Pulmonary problem list: Tracheostomy dependence following prolonged critical illness Obstructive sleep apnea/obesity hypoventilation syndrome COPD Tobacco abuse Severe physical deconditioning  Discussion Tony Young is status post a very prolonged critical illness, superimposed on fairly extensive underlying comorbidities.  He has a history of obstructive sleep apnea, and had been on CPAP in the past however due to more recent weight gain even found this intolerable, and not meeting his needs therefore he had not been using it.  He has had at least 2 exacerbations of his underlying lung disease in addition to hospital-acquired pneumonias during his last 2 acute stays including his initial hospitalization [facility]  From a pulmonary standpoint however he has at least been weaned off the ventilator, and seems to be making progress.  That being said I do not think he is a candidate for immediate decannulation, and as he and I discussed his long as he has tracheostomy in place his sleep apnea is treated.  His response to this was he would like to "just keep the trach", which I think is a wise decision given his intolerance of CPAP in the past.  I do not see a role for downsizing the trach as he currently phonates well, and is tolerating his current diet so there would be no benefit to a smaller airway, in fact should he  have mucous plugging in the future I would consider a smaller airway more of a risk than benefit.  In regards to possible pulmonary clearance for skin graft in the future.  There are no absolute pulmonary contraindications for procedures in the future.  From a general medical standpoint he should be on at least 3 weeks of anticoagulation given his recent diagnosis of DVT increasing risk of PE, and would even consider repeating lower extremity ultrasounds prior to surgery and holding anticoagulation.  His overall pulmonary risk is mediated by the fact that he has tracheostomy in place already repaired by stabilizing his airway sounds.  His primary risk from a pulmonary standpoint would be prolonged ventilator dependence postoperatively, however this would not be a absolute contraindication to surgery.  Plan/rec Continue routine trach care Encourage daytime Passy-Muir valve as much as tolerated No role for downsizing tracheostomy in this setting risk would outweigh benefit Continue bronchodilators for his COPD continue to work on rehabilitation  We will continue to follow weekly while here in rehab unit, But I have no immediate recommendations for decannulation.  Best Practice (right click and "Reselect all SmartList Selections" daily)   Per primary   Labs   CBC: Recent Labs  Lab 07/16/21 0612 07/18/21 0552 07/20/21 0555  WBC 2.9* 3.4* 3.5*  NEUTROABS 2.1 2.2 2.4  HGB 9.0* 9.1* 8.9*  HCT 28.6* 29.0* 27.7*  MCV 89.7 90.1 88.8  PLT 166 PLATELET CLUMPS NOTED ON SMEAR, UNABLE TO ESTIMATE 111*    Basic Metabolic Panel: Recent Labs  Lab 07/15/21 0618 07/16/21 0612 07/19/21 0723 07/20/21 0555  NA  --  138  --  132*  K 4.0 3.9  --  3.9  CL  --  102  --  96*  CO2  --  24  --  25  GLUCOSE  --  115* 431* 336*  BUN  --  40*  --  19  CREATININE  --  1.20  --  1.16  CALCIUM  --  10.2  --  8.3*   GFR: Estimated Creatinine Clearance: 77.3 mL/min (by C-G formula based on SCr of 1.16  mg/dL). Recent Labs  Lab 07/16/21 0612 07/18/21 0552 07/20/21 0555  WBC 2.9* 3.4* 3.5*    Liver Function Tests: Recent Labs  Lab 07/16/21 0612  AST 17  ALT 26  ALKPHOS 87  BILITOT 0.3  PROT 5.7*  ALBUMIN 2.6*   No results for input(s): LIPASE, AMYLASE in the last 168 hours. No results for input(s): AMMONIA in the last 168 hours.  ABG    Component Value Date/Time   PHART 7.482 (H) 07/03/2021 0950   PCO2ART 33.6 07/03/2021 0950   PO2ART 56.3 (L) 07/03/2021 0950   HCO3 25.0 07/03/2021 0950   ACIDBASEDEF 1.3 06/25/2021 1252   O2SAT 91.2 07/03/2021 0950     Coagulation Profile: No results for input(s): INR, PROTIME in the last 168 hours.  Cardiac Enzymes: No results for input(s): CKTOTAL, CKMB, CKMBINDEX, TROPONINI in the last 168 hours.  HbA1C: Hgb A1c MFr Bld  Date/Time Value Ref Range Status  07/16/2021 06:12 AM 5.9 (H) 4.8 - 5.6 % Final    Comment:    (NOTE) Pre diabetes:          5.7%-6.4%  Diabetes:              >6.4%  Glycemic control for   <7.0% adults with diabetes     CBG: Recent Labs  Lab 07/20/21 1149 07/20/21 1723 07/20/21 2140 07/21/21 0710 07/21/21 1206  GLUCAP 241* 255* 332* 359* 292*    Review of Systems:   Review of Systems  Constitutional:  Negative for fever and malaise/fatigue.  HENT: Negative.    Eyes: Negative.   Respiratory:  Positive for shortness of breath.   Cardiovascular:  Positive for leg swelling.  Gastrointestinal: Negative.   Genitourinary: Negative.   Musculoskeletal: Negative.   Skin:  Positive for rash.  Endo/Heme/Allergies: Negative.     Past Medical History:  He,  has a past medical history of CAD (coronary artery disease), Cardiomyopathy ([place]), Colon cancer ([place]), COPD (chronic obstructive pulmonary disease) ([place]), OSA (obstructive sleep apnea), and Type 2 diabetes mellitus ([place]).   Surgical History:   Past Surgical History:  Procedure Laterality Date   COLON RESECTION     IR REMOVAL TUN CV CATH  W/O FL  07/01/2021   PEG PLACEMENT  05/26/2021   TRACHEOSTOMY  05/26/2021     Social History:   reports that he has never smoked. He has never used smokeless tobacco. He reports that he does not drink alcohol and does not use drugs.   Family History:  His family history includes Cancer in his father; Diabetes in his maternal grandfather.   Allergies Allergies  Allergen Reactions   Statins Other (See Comments)    Made hands swell   Penicillins Nausea And Vomiting and Other (See Comments)    Other reaction(s): GI intolerance Other reaction(s): Nausea And Vomiting, Other (See Comments), Other (see comments) Other reaction(s): Other (See Comments) Other reaction(s): Nausea And Vomiting, Other (See Comments), Other (see comments) Other reaction(s): Nausea And Vomiting, Other (See  Comments), Other (see comments)    Ropinirole Other (See Comments)     Home Medications  Prior to Admission medications   Medication Sig Start Date End Date Taking? Authorizing Provider  amantadine (SYMMETREL) 100 MG capsule Take 50 mg by mouth daily.   Yes [provider]  amiodarone (PACERONE) 200 MG tablet Take 200 mg by mouth daily.   Yes [provider]  b complex-vitamin c-folic acid (NEPHRO-VITE) 0.8 MG TABS tablet Take 1 tablet by mouth daily.   Yes [provider]  busPIRone (BUSPAR) 5 MG tablet Take 7.5 mg by mouth 2 (two) times daily.   Yes [provider]  Carboxymethylcellulose Sod PF 0.5 % SOLN Place 1 drop into both eyes in the morning, at noon, and at bedtime.   Yes [provider]  Cholecalciferol (VITAMIN D3) 25 MCG (1000 UT) CAPS Take 1,000 Units by mouth daily.   Yes [provider]  ferrous sulfate 300 (60 Fe) MG/5ML syrup Take 300 mg by mouth daily.   Yes [provider]  fiber (NUTRISOURCE FIBER) PACK packet Take 1 packet by mouth in the morning, at noon, and at bedtime.   Yes [provider]  insulin glargine  (LANTUS) 100 UNIT/ML injection Inject 28 Units into the skin 2 (two) times daily.   Yes [provider]  insulin lispro (HUMALOG) 100 UNIT/ML injection Inject 1 Units into the skin every 6 (six) hours. Per sliding scale   Yes [provider]  ipratropium-albuterol (DUONEB) 0.5-2.5 (3) MG/3ML SOLN Take 3 mLs by nebulization in the morning, at noon, and at bedtime.   Yes [provider]  latanoprost (XALATAN) 0.005 % ophthalmic solution Place 1 drop into both eyes at bedtime.   Yes [provider]  levothyroxine (SYNTHROID) 75 MCG tablet Take 75 mcg by mouth daily before breakfast.   Yes [provider]  loratadine (CLARITIN) 10 MG tablet Take 10 mg by mouth daily.   Yes [provider]  Melatonin 3 MG CAPS Take 3 mg by mouth at bedtime.   Yes [provider]  metoprolol tartrate (LOPRESSOR) 50 MG tablet Take 50 mg by mouth 2 (two) times daily.   Yes [provider]  Metoprolol Tartrate (METOPROLOL TARTARATE 1 MG/ML SYRINGE, 5ML,) Inject 2.5 mg into the vein every 6 (six) hours as needed (high blood pressure).   Yes [provider]  modafinil (PROVIGIL) 100 MG tablet Take 100 mg by mouth daily.   Yes [provider]  nutrition supplement, JUVEN, (JUVEN) PACK Take 1 packet by mouth in the morning, at noon, and at bedtime.   Yes [provider]  Omega-3 Fatty Acids (FISH OIL) 1000 MG CAPS Take 4,000 mg by mouth daily.   Yes [provider]  pantoprazole sodium (PROTONIX) 40 mg/20 mL SUSP Take 40 mg by mouth daily.   Yes [provider]  potassium chloride (KLOR-CON) 20 MEQ packet Take 40 mEq by mouth every 4 (four) hours as needed (low potassium).   Yes [provider]  predniSONE (DELTASONE) 20 MG tablet Take 20 mg by mouth daily with breakfast.   Yes [provider]  pregabalin (LYRICA) 50 MG capsule Take 50 mg by mouth daily.   Yes [provider]  primidone  (MYSOLINE) 50 MG tablet Take 50 mg by mouth daily.   Yes [provider]  risperiDONE (RISPERDAL) 1 MG tablet Take 1 mg by mouth 2 (two) times daily.   Yes [provider]  sertraline (  ZOLOFT) 50 MG tablet Take 100 mg by mouth at bedtime.   Yes [provider]  vitamin C (ASCORBIC ACID) 500 MG tablet Take 500 mg by mouth daily.   Yes [provider]     25 minutes    Erick Colace ACNP-BC [facility] Pager # [phone] OR # [phone] if no answer

## 2021-07-21 NOTE — H&P (View-Only) (Signed)
Reason for Consult/CC:left lower extremity open wounds after necrotizing infection.  Tony Young is an 66 y.o. male.  HPI: 66 year old with left lower extremity open wounds after necrotizing infection.  He has had multiple surgeries to debride this area.  His medical history is very complicated but also notable for atrial fibrillation requiring anticoagulation as well as bilateral lower extremity deep vein thrombosis.  He is on therapeutic Lovenox at this time.  Allergies:  Allergies  Allergen Reactions   Statins Other (See Comments)    Made hands swell   Penicillins Nausea And Vomiting and Other (See Comments)    Other reaction(s): GI intolerance Other reaction(s): Nausea And Vomiting, Other (See Comments), Other (see comments) Other reaction(s): Other (See Comments) Other reaction(s): Nausea And Vomiting, Other (See Comments), Other (see comments) Other reaction(s): Nausea And Vomiting, Other (See Comments), Other (see comments)    Ropinirole Other (See Comments)    Medications:  Current Facility-Administered Medications:    acetaminophen (TYLENOL) tablet 325-650 mg, 325-650 mg, Oral, Q4H PRN, Love, Pamela S, PA-C, 650 mg at 07/21/21 1311   alum & mag hydroxide-simeth (MAALOX/MYLANTA) 200-200-20 MG/5ML suspension 30 mL, 30 mL, Oral, Q4H PRN, Love, Pamela S, PA-C   amantadine (SYMMETREL) 50 MG/5ML solution 50 mg, 50 mg, Oral, Daily, Love, Pamela S, PA-C, 50 mg at 07/21/21 0930   amiodarone (PACERONE) tablet 200 mg, 200 mg, Oral, BID, Tobb, Kardie, DO, 200 mg at 07/21/21 9323   ascorbic acid (VITAMIN C) tablet 500 mg, 500 mg, Oral, BID, Love, Pamela S, PA-C, 500 mg at 07/21/21 5573   bisacodyl (DULCOLAX) suppository 10 mg, 10 mg, Rectal, Daily PRN, Love, Pamela S, PA-C   busPIRone (BUSPAR) tablet 7.5 mg, 7.5 mg, Oral, BID, Love, Pamela S, PA-C, 7.5 mg at 07/21/21 2202   cholecalciferol (VITAMIN D3) tablet 1,000 Units, 1,000 Units, Oral, Daily, Love, Pamela S, PA-C, 1,000 Units  at 07/21/21 5427   collagenase (SANTYL) ointment, , Topical, Daily, Lovorn, Megan, MD, Given at 07/21/21 0932   diphenhydrAMINE (BENADRYL) 12.5 MG/5ML elixir 12.5-25 mg, 12.5-25 mg, Oral, Q6H PRN, Love, Pamela S, PA-C   enoxaparin (LOVENOX) injection 120 mg, 120 mg, Subcutaneous, BID, Pham, Minh Q, RPH-CPP, 120 mg at 07/21/21 0932   feeding supplement (ENSURE ENLIVE / ENSURE PLUS) liquid 237 mL, 237 mL, Oral, BID BM, Lovorn, Megan, MD, 237 mL at 07/21/21 1257   feeding supplement (OSMOLITE 1.5 CAL) liquid 900 mL, 900 mL, Per Tube, Q24H, Kirsteins, Luanna Salk, MD, Last Rate: 50 mL/hr at 07/21/21 1814, 900 mL at 07/21/21 1814   free water 100 mL, 100 mL, Per Tube, Q6H, Love, Pamela S, PA-C, 100 mL at 07/21/21 1749   guaiFENesin-dextromethorphan (ROBITUSSIN DM) 100-10 MG/5ML syrup 5-10 mL, 5-10 mL, Oral, Q6H PRN, Love, Pamela S, PA-C   insulin aspart (novoLOG) injection 0-15 Units, 0-15 Units, Subcutaneous, TID WC, Kirsteins, Luanna Salk, MD, 8 Units at 07/21/21 1750   insulin aspart (novoLOG) injection 0-5 Units, 0-5 Units, Subcutaneous, QHS, Love, Pamela S, PA-C, 4 Units at 07/20/21 2155   insulin glargine-yfgn (SEMGLEE) injection 24 Units, 24 Units, Subcutaneous, QHS, Lovorn, Megan, MD   ipratropium-albuterol (DUONEB) 0.5-2.5 (3) MG/3ML nebulizer solution 3 mL, 3 mL, Nebulization, Q4H PRN, Lovorn, Megan, MD   iron polysaccharides (NIFEREX) capsule 150 mg, 150 mg, Oral, QPC supper, Love, Pamela S, PA-C, 150 mg at 07/21/21 1749   latanoprost (XALATAN) 0.005 % ophthalmic solution 1 drop, 1 drop, Both Eyes, QHS, Love, Pamela S, PA-C, 1 drop at 07/20/21 2158  levothyroxine (SYNTHROID) tablet 75 mcg, 75 mcg, Oral, Q0600, Love, Pamela S, PA-C, 75 mcg at 07/21/21 0554   loratadine (CLARITIN) tablet 10 mg, 10 mg, Oral, Daily, Love, Pamela S, PA-C, 10 mg at 07/21/21 0930   melatonin tablet 3 mg, 3 mg, Oral, QHS, Love, Pamela S, PA-C, 3 mg at 07/20/21 2138   metoprolol tartrate (LOPRESSOR) tablet 50 mg, 50 mg,  Oral, BID, Love, Pamela S, PA-C, 50 mg at 07/21/21 0272   multivitamin with minerals tablet 1 tablet, 1 tablet, Oral, Daily, Lovorn, Megan, MD, 1 tablet at 07/21/21 0930   naphazoline-glycerin (CLEAR EYES REDNESS) ophth solution 1-2 drop, 1-2 drop, Both Eyes, QID PRN, Love, Pamela S, PA-C   nutrition supplement (JUVEN) (JUVEN) powder packet 1 packet, 1 packet, Per Tube, BID BM, Lovorn, Megan, MD, 1 packet at 07/21/21 1257   oxyCODONE (Oxy IR/ROXICODONE) immediate release tablet 5 mg, 5 mg, Oral, Q4H PRN, Love, Pamela S, PA-C, 5 mg at 07/21/21 1748   pantoprazole (PROTONIX) EC tablet 40 mg, 40 mg, Oral, BID AC, Love, Pamela S, PA-C, 40 mg at 07/21/21 1749   polycarbophil (FIBERCON) tablet 625 mg, 625 mg, Oral, BID, Love, Pamela S, PA-C, 625 mg at 07/21/21 5366   polyethylene glycol (MIRALAX / GLYCOLAX) packet 17 g, 17 g, Oral, Daily PRN, Love, Pamela S, PA-C   predniSONE (DELTASONE) tablet 10 mg, 10 mg, Oral, Q breakfast, Love, Pamela S, PA-C, 10 mg at 07/21/21 0931   pregabalin (LYRICA) capsule 50 mg, 50 mg, Oral, Daily, Love, Pamela S, PA-C, 50 mg at 07/21/21 0930   primidone (MYSOLINE) tablet 50 mg, 50 mg, Oral, Daily, Love, Pamela S, PA-C, 50 mg at 07/21/21 0930   prochlorperazine (COMPAZINE) tablet 5-10 mg, 5-10 mg, Oral, Q6H PRN **OR** prochlorperazine (COMPAZINE) injection 5-10 mg, 5-10 mg, Intramuscular, Q6H PRN **OR** prochlorperazine (COMPAZINE) suppository 12.5 mg, 12.5 mg, Rectal, Q6H PRN, Love, Pamela S, PA-C   risperiDONE (RISPERDAL) tablet 1 mg, 1 mg, Oral, BID, Love, Pamela S, PA-C, 1 mg at 07/21/21 0930   sertraline (ZOLOFT) tablet 100 mg, 100 mg, Oral, QHS, Love, Pamela S, PA-C, 100 mg at 07/20/21 2139   silver nitrate applicators applicator 1 application, 1 application, Topical, PRN, Lovorn, Megan, MD   sodium phosphate (FLEET) 7-19 GM/118ML enema 1 enema, 1 enema, Rectal, Once PRN, Love, Pamela S, PA-C   traMADol (ULTRAM) tablet 50 mg, 50 mg, Oral, Q6H, Lovorn, Megan, MD    traZODone (DESYREL) tablet 25-50 mg, 25-50 mg, Oral, QHS PRN, Love, Pamela S, PA-C   zinc sulfate capsule 220 mg, 220 mg, Oral, Daily, Love, Pamela S, PA-C, 220 mg at 07/21/21 4403  Past Medical History:  Diagnosis Date   CAD (coronary artery disease)    Cardiomyopathy ([place] [place])    Colon cancer ([place])    COPD (chronic obstructive pulmonary disease) ([place])    OSA (obstructive sleep apnea)    Type 2 diabetes mellitus ([place])     Past Surgical History:  Procedure Laterality Date   COLON RESECTION     IR REMOVAL TUN CV CATH W/O FL  07/01/2021   PEG PLACEMENT  05/26/2021   TRACHEOSTOMY  05/26/2021    Family History  Problem Relation Age of Onset   Cancer Father    Diabetes Maternal Grandfather     Social History:  reports that he has never smoked. He has never used smokeless tobacco. He reports that he does not drink alcohol and does not use drugs.  Physical Exam Blood pressure 107/65, pulse Marland Kitchen)  104, temperature 97.8 F (36.6 C), temperature source Oral, resp. rate 16, height 5\' 7"  (1.702 m), weight (S) 116.2 kg, SpO2 100 %. General: Alert,, conversational. Extremity: Large left lower extremity wound with healthy granulation tissue.  Results for orders placed or performed during the hospital encounter of 07/15/21 (from the past 48 hour(s))  Glucose, capillary     Status: Abnormal   Collection Time: 07/19/21  9:35 PM  Result Value Ref Range   Glucose-Capillary 319 (H) 70 - 99 mg/dL    Comment: Glucose reference range applies only to samples taken after fasting for at least 8 hours.  Basic metabolic panel     Status: Abnormal   Collection Time: 07/20/21  5:55 AM  Result Value Ref Range   Sodium 132 (L) 135 - 145 mmol/L   Potassium 3.9 3.5 - 5.1 mmol/L   Chloride 96 (L) 98 - 111 mmol/L   CO2 25 22 - 32 mmol/L   Glucose, Bld 336 (H) 70 - 99 mg/dL    Comment: Glucose reference range applies only to samples taken after fasting for at least 8 hours.   BUN 19 8 - 23 mg/dL   Creatinine,  Ser 1.16 0.61 - 1.24 mg/dL   Calcium 8.3 (L) 8.9 - 10.3 mg/dL   GFR, Estimated >60 >60 mL/min    Comment: (NOTE) Calculated using the CKD-EPI Creatinine Equation (2021)    Anion gap 11 5 - 15    Comment: Performed at [facility] [address], [place], [place] [address]  CBC with Differential/Platelet     Status: Abnormal   Collection Time: 07/20/21  5:55 AM  Result Value Ref Range   WBC 3.5 (L) 4.0 - 10.5 K/uL   RBC 3.12 (L) 4.22 - 5.81 MIL/uL   Hemoglobin 8.9 (L) 13.0 - 17.0 g/dL   HCT 27.7 (L) 39.0 - 52.0 %   MCV 88.8 80.0 - 100.0 fL   MCH 28.5 26.0 - 34.0 pg   MCHC 32.1 30.0 - 36.0 g/dL   RDW 16.2 (H) 11.5 - 15.5 %   Platelets 111 (L) 150 - 400 K/uL    Comment: Immature Platelet Fraction may be clinically indicated, consider ordering this additional test [id number] REPEATED TO VERIFY PLATELET COUNT CONFIRMED BY SMEAR    nRBC 0.0 0.0 - 0.2 %   Neutrophils Relative % 70 %   Neutro Abs 2.4 1.7 - 7.7 K/uL   Lymphocytes Relative 16 %   Lymphs Abs 0.6 (L) 0.7 - 4.0 K/uL   Monocytes Relative 11 %   Monocytes Absolute 0.4 0.1 - 1.0 K/uL   Eosinophils Relative 1 %   Eosinophils Absolute 0.1 0.0 - 0.5 K/uL   Basophils Relative 0 %   Basophils Absolute 0.0 0.0 - 0.1 K/uL   Immature Granulocytes 2 %   Abs Immature Granulocytes 0.08 (H) 0.00 - 0.07 K/uL    Comment: Performed at [facility] [address], [place], [place] [address]  Glucose, capillary     Status: Abnormal   Collection Time: 07/20/21  6:18 AM  Result Value Ref Range   Glucose-Capillary 313 (H) 70 - 99 mg/dL    Comment: Glucose reference range applies only to samples taken after fasting for at least 8 hours.  Glucose, capillary     Status: Abnormal   Collection Time: 07/20/21  7:54 AM  Result Value Ref Range   Glucose-Capillary 285 (H) 70 - 99 mg/dL    Comment: Glucose reference range applies only to  samples taken after fasting for at least 8 hours.  Glucose, capillary     Status: Abnormal    Collection Time: 07/20/21 11:49 AM  Result Value Ref Range   Glucose-Capillary 241 (H) 70 - 99 mg/dL    Comment: Glucose reference range applies only to samples taken after fasting for at least 8 hours.  Glucose, capillary     Status: Abnormal   Collection Time: 07/20/21  5:23 PM  Result Value Ref Range   Glucose-Capillary 255 (H) 70 - 99 mg/dL    Comment: Glucose reference range applies only to samples taken after fasting for at least 8 hours.  Glucose, capillary     Status: Abnormal   Collection Time: 07/20/21  9:40 PM  Result Value Ref Range   Glucose-Capillary 332 (H) 70 - 99 mg/dL    Comment: Glucose reference range applies only to samples taken after fasting for at least 8 hours.  Glucose, capillary     Status: Abnormal   Collection Time: 07/21/21  7:10 AM  Result Value Ref Range   Glucose-Capillary 359 (H) 70 - 99 mg/dL    Comment: Glucose reference range applies only to samples taken after fasting for at least 8 hours.  Glucose, capillary     Status: Abnormal   Collection Time: 07/21/21 12:06 PM  Result Value Ref Range   Glucose-Capillary 292 (H) 70 - 99 mg/dL    Comment: Glucose reference range applies only to samples taken after fasting for at least 8 hours.  Glucose, capillary     Status: Abnormal   Collection Time: 07/21/21  4:29 PM  Result Value Ref Range   Glucose-Capillary 269 (H) 70 - 99 mg/dL    Comment: Glucose reference range applies only to samples taken after fasting for at least 8 hours.    DG Knee 1-2 Views Left  Result Date: 07/21/2021 CLINICAL DATA:  Left knee pain after attempting to walk in rehab. History of fall. EXAM: LEFT KNEE - 1-2 VIEW COMPARISON:  None. FINDINGS: Small knee joint effusion. No fracture or dislocation. No evidence of lipohemarthrosis. 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 No  evidence of chondrocalcinosis. Scattered adjacent vascular calcifications. No radiopaque foreign body. IMPRESSION: 1. Small knee joint effusion without associated fracture or lipohemarthrosis. 2. 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 Electronically Signed   By: Sandi Mariscal M.D.   On: 07/21/2021 16:56    Assessment/Plan: Patient has a large left lower extremity wound after necrotizing infection.  Operative reconstruction of this wound would a skin graft is indicated even in a patient with very significant comorbidities such as this patient.  Cardiology has seen him to assess his risk and has made some recommendations.  Following this we just need to plan for holding his anticoagulants for surgery which could be holding Lovenox or converting him to heparin drip and holding heparin for skin grafting of this rather large area.  Ward time:  52 minutes of ward time was spent face to face with this patient and reviewing his clinical records. Lennice Sites 07/21/2021, 8:22 PM

## 2021-07-21 NOTE — Progress Notes (Signed)
Patient ID: Tony Young, male   DOB: February 24, 1956, 66 y.o.   MRN: [id number]  Met with pt and spoke with his daughter-Phoebe via telephone to discuss team conference goals of mod assist and ELOS 4 weeks. She was aware he will need 24/7 physical care and will get this in place. She reports they are committed to him and will make sure he has the care he needs at home. Will continue to follow and work with on discharge needs and provide support.

## 2021-07-21 NOTE — Progress Notes (Signed)
Speech Language Pathology Daily Session Note  Patient Details  Name: Tony Young MRN: [id number] Date of Birth: Mar 18, 1956  Today's Date: 07/21/2021 SLP Individual Time: 9233-0076 SLP Individual Time Calculation (min): 69 min  Short Term Goals: Week 1: SLP Short Term Goal 1 (Week 1): Patient will tolerate current diet with min A verbal cues for implementing safe swallowing precautions and strategies (e.g., no straws, single sips, small bites, slow rate). SLP Short Term Goal 2 (Week 1): Patient will consume dys 3 trials with effective mastication, minimal oral residuals, and without overt s/sx of aspiration with min A verbal cues to implement safe swallow precautions and strategies prior to diet advancement SLP Short Term Goal 3 (Week 1): Patient will participate in further cognitive-linguistic assessment SLP Short Term Goal 3 - Progress (Week 1): Met SLP Short Term Goal 4 (Week 1): Patient will utilize external/internal memory aids to recall important information with minA.  Skilled Therapeutic Interventions:Skilled ST services focused on education, swallow and cognitive skills. SLP continue to gather baseline information from pt and pt's family to assess acute deficits. Pt expressed limited ability to read and write, stopping education at 9th grade. SLP and Pt called daughter whom provided further baseline information. [name] managed all IADLs and noted acute changes in cognition in the areas of short term recall and orientation at this time. SLP recommends continued assessment of problem solving skills as increase ability to utilize UE, however at this time awareness and problem solving appear intact. Pt had no baseline deficits with swallow and was consuming regular textures and thin liquids. Pt then took a second phone call from a family member and demonstrated appropriate recall during conversation. Pt continues to demonstrate [facility] vital signs with PMSV in place. Pt consumed thin  liquid via straw with consecutive sips despite verbal cue for small sips, pt demonstrated delayed throat clear. SLP provided education pertaining to residue noted on most recent MBS and need to allow swallow function to "reset" in between sips. Pt agreed. Pt was left in room with call bell within reach and bed alarm set. SLP recommends to continue skilled services.     Pain Pain Assessment Pain Score: 2  Pain Type: Acute pain Pain Location: Buttocks  Therapy/Group: Individual Therapy  Kyndall Chaplin  [facility] 07/21/2021, 4:57 PM

## 2021-07-21 NOTE — H&P (View-Only) (Signed)
Reason for Consult/CC:left lower extremity open wounds after necrotizing infection.  Tony Young is an 66 y.o. male.  HPI: 66 year old with left lower extremity open wounds after necrotizing infection.  He has had multiple surgeries to debride this area.  His medical history is very complicated but also notable for atrial fibrillation requiring anticoagulation as well as bilateral lower extremity deep vein thrombosis.  He is on therapeutic Lovenox at this time.  Allergies:  Allergies  Allergen Reactions   Statins Other (See Comments)    Made hands swell   Penicillins Nausea And Vomiting and Other (See Comments)    Other reaction(s): GI intolerance Other reaction(s): Nausea And Vomiting, Other (See Comments), Other (see comments) Other reaction(s): Other (See Comments) Other reaction(s): Nausea And Vomiting, Other (See Comments), Other (see comments) Other reaction(s): Nausea And Vomiting, Other (See Comments), Other (see comments)    Ropinirole Other (See Comments)    Medications:  Current Facility-Administered Medications:    acetaminophen (TYLENOL) tablet 325-650 mg, 325-650 mg, Oral, Q4H PRN, Love, Pamela S, PA-C, 650 mg at 07/21/21 1311   alum & mag hydroxide-simeth (MAALOX/MYLANTA) 200-200-20 MG/5ML suspension 30 mL, 30 mL, Oral, Q4H PRN, Love, Pamela S, PA-C   amantadine (SYMMETREL) 50 MG/5ML solution 50 mg, 50 mg, Oral, Daily, Love, Pamela S, PA-C, 50 mg at 07/21/21 0930   amiodarone (PACERONE) tablet 200 mg, 200 mg, Oral, BID, Tobb, Kardie, DO, 200 mg at 07/21/21 9528   ascorbic acid (VITAMIN C) tablet 500 mg, 500 mg, Oral, BID, Love, Pamela S, PA-C, 500 mg at 07/21/21 4132   bisacodyl (DULCOLAX) suppository 10 mg, 10 mg, Rectal, Daily PRN, Love, Pamela S, PA-C   busPIRone (BUSPAR) tablet 7.5 mg, 7.5 mg, Oral, BID, Love, Pamela S, PA-C, 7.5 mg at 07/21/21 4401   cholecalciferol (VITAMIN D3) tablet 1,000 Units, 1,000 Units, Oral, Daily, Love, Pamela S, PA-C, 1,000 Units  at 07/21/21 0272   collagenase (SANTYL) ointment, , Topical, Daily, Lovorn, Megan, MD, Given at 07/21/21 0932   diphenhydrAMINE (BENADRYL) 12.5 MG/5ML elixir 12.5-25 mg, 12.5-25 mg, Oral, Q6H PRN, Love, Pamela S, PA-C   enoxaparin (LOVENOX) injection 120 mg, 120 mg, Subcutaneous, BID, Pham, Minh Q, RPH-CPP, 120 mg at 07/21/21 0932   feeding supplement (ENSURE ENLIVE / ENSURE PLUS) liquid 237 mL, 237 mL, Oral, BID BM, Lovorn, Megan, MD, 237 mL at 07/21/21 1257   feeding supplement (OSMOLITE 1.5 CAL) liquid 900 mL, 900 mL, Per Tube, Q24H, Kirsteins, Luanna Salk, MD, Last Rate: 50 mL/hr at 07/21/21 1814, 900 mL at 07/21/21 1814   free water 100 mL, 100 mL, Per Tube, Q6H, Love, Pamela S, PA-C, 100 mL at 07/21/21 1749   guaiFENesin-dextromethorphan (ROBITUSSIN DM) 100-10 MG/5ML syrup 5-10 mL, 5-10 mL, Oral, Q6H PRN, Love, Pamela S, PA-C   insulin aspart (novoLOG) injection 0-15 Units, 0-15 Units, Subcutaneous, TID WC, Kirsteins, Luanna Salk, MD, 8 Units at 07/21/21 1750   insulin aspart (novoLOG) injection 0-5 Units, 0-5 Units, Subcutaneous, QHS, Love, Pamela S, PA-C, 4 Units at 07/20/21 2155   insulin glargine-yfgn (SEMGLEE) injection 24 Units, 24 Units, Subcutaneous, QHS, Lovorn, Megan, MD   ipratropium-albuterol (DUONEB) 0.5-2.5 (3) MG/3ML nebulizer solution 3 mL, 3 mL, Nebulization, Q4H PRN, Lovorn, Megan, MD   iron polysaccharides (NIFEREX) capsule 150 mg, 150 mg, Oral, QPC supper, Love, Pamela S, PA-C, 150 mg at 07/21/21 1749   latanoprost (XALATAN) 0.005 % ophthalmic solution 1 drop, 1 drop, Both Eyes, QHS, Love, Pamela S, PA-C, 1 drop at 07/20/21 2158  levothyroxine (SYNTHROID) tablet 75 mcg, 75 mcg, Oral, Q0600, Love, Pamela S, PA-C, 75 mcg at 07/21/21 0554   loratadine (CLARITIN) tablet 10 mg, 10 mg, Oral, Daily, Love, Pamela S, PA-C, 10 mg at 07/21/21 0930   melatonin tablet 3 mg, 3 mg, Oral, QHS, Love, Pamela S, PA-C, 3 mg at 07/20/21 2138   metoprolol tartrate (LOPRESSOR) tablet 50 mg, 50 mg,  Oral, BID, Love, Pamela S, PA-C, 50 mg at 07/21/21 0092   multivitamin with minerals tablet 1 tablet, 1 tablet, Oral, Daily, Lovorn, Megan, MD, 1 tablet at 07/21/21 0930   naphazoline-glycerin (CLEAR EYES REDNESS) ophth solution 1-2 drop, 1-2 drop, Both Eyes, QID PRN, Love, Pamela S, PA-C   nutrition supplement (JUVEN) (JUVEN) powder packet 1 packet, 1 packet, Per Tube, BID BM, Lovorn, Megan, MD, 1 packet at 07/21/21 1257   oxyCODONE (Oxy IR/ROXICODONE) immediate release tablet 5 mg, 5 mg, Oral, Q4H PRN, Love, Pamela S, PA-C, 5 mg at 07/21/21 1748   pantoprazole (PROTONIX) EC tablet 40 mg, 40 mg, Oral, BID AC, Love, Pamela S, PA-C, 40 mg at 07/21/21 1749   polycarbophil (FIBERCON) tablet 625 mg, 625 mg, Oral, BID, Love, Pamela S, PA-C, 625 mg at 07/21/21 3300   polyethylene glycol (MIRALAX / GLYCOLAX) packet 17 g, 17 g, Oral, Daily PRN, Love, Pamela S, PA-C   predniSONE (DELTASONE) tablet 10 mg, 10 mg, Oral, Q breakfast, Love, Pamela S, PA-C, 10 mg at 07/21/21 0931   pregabalin (LYRICA) capsule 50 mg, 50 mg, Oral, Daily, Love, Pamela S, PA-C, 50 mg at 07/21/21 0930   primidone (MYSOLINE) tablet 50 mg, 50 mg, Oral, Daily, Love, Pamela S, PA-C, 50 mg at 07/21/21 0930   prochlorperazine (COMPAZINE) tablet 5-10 mg, 5-10 mg, Oral, Q6H PRN **OR** prochlorperazine (COMPAZINE) injection 5-10 mg, 5-10 mg, Intramuscular, Q6H PRN **OR** prochlorperazine (COMPAZINE) suppository 12.5 mg, 12.5 mg, Rectal, Q6H PRN, Love, Pamela S, PA-C   risperiDONE (RISPERDAL) tablet 1 mg, 1 mg, Oral, BID, Love, Pamela S, PA-C, 1 mg at 07/21/21 0930   sertraline (ZOLOFT) tablet 100 mg, 100 mg, Oral, QHS, Love, Pamela S, PA-C, 100 mg at 07/20/21 2139   silver nitrate applicators applicator 1 application, 1 application, Topical, PRN, Lovorn, Megan, MD   sodium phosphate (FLEET) 7-19 GM/118ML enema 1 enema, 1 enema, Rectal, Once PRN, Love, Pamela S, PA-C   traMADol (ULTRAM) tablet 50 mg, 50 mg, Oral, Q6H, Lovorn, Megan, MD    traZODone (DESYREL) tablet 25-50 mg, 25-50 mg, Oral, QHS PRN, Love, Pamela S, PA-C   zinc sulfate capsule 220 mg, 220 mg, Oral, Daily, Love, Pamela S, PA-C, 220 mg at 07/21/21 7622  Past Medical History:  Diagnosis Date   CAD (coronary artery disease)    Cardiomyopathy ([place])    Colon cancer ([place])    COPD (chronic obstructive pulmonary disease) ([place])    OSA (obstructive sleep apnea)    Type 2 diabetes mellitus ([place])     Past Surgical History:  Procedure Laterality Date   COLON RESECTION     IR REMOVAL TUN CV CATH W/O FL  07/01/2021   PEG PLACEMENT  05/26/2021   TRACHEOSTOMY  05/26/2021    Family History  Problem Relation Age of Onset   Cancer Father    Diabetes Maternal Grandfather     Social History:  reports that he has never smoked. He has never used smokeless tobacco. He reports that he does not drink alcohol and does not use drugs.  Physical Exam Blood pressure 107/65, pulse Marland Kitchen)  104, temperature 97.8 F (36.6 C), temperature source Oral, resp. rate 16, height 5\' 7"  (1.702 m), weight (S) 116.2 kg, SpO2 100 %. General: Alert,, conversational. Extremity: Large left lower extremity wound with healthy granulation tissue.  Results for orders placed or performed during the hospital encounter of 07/15/21 (from the past 48 hour(s))  Glucose, capillary     Status: Abnormal   Collection Time: 07/19/21  9:35 PM  Result Value Ref Range   Glucose-Capillary 319 (H) 70 - 99 mg/dL    Comment: Glucose reference range applies only to samples taken after fasting for at least 8 hours.  Basic metabolic panel     Status: Abnormal   Collection Time: 07/20/21  5:55 AM  Result Value Ref Range   Sodium 132 (L) 135 - 145 mmol/L   Potassium 3.9 3.5 - 5.1 mmol/L   Chloride 96 (L) 98 - 111 mmol/L   CO2 25 22 - 32 mmol/L   Glucose, Bld 336 (H) 70 - 99 mg/dL    Comment: Glucose reference range applies only to samples taken after fasting for at least 8 hours.   BUN 19 8 - 23 mg/dL   Creatinine,  Ser 1.16 0.61 - 1.24 mg/dL   Calcium 8.3 (L) 8.9 - 10.3 mg/dL   GFR, Estimated >60 >60 mL/min    Comment: (NOTE) Calculated using the CKD-EPI Creatinine Equation (2021)    Anion gap 11 5 - 15    Comment: Performed at [facility] [address] [place], [place] [address]  CBC with Differential/Platelet     Status: Abnormal   Collection Time: 07/20/21  5:55 AM  Result Value Ref Range   WBC 3.5 (L) 4.0 - 10.5 K/uL   RBC 3.12 (L) 4.22 - 5.81 MIL/uL   Hemoglobin 8.9 (L) 13.0 - 17.0 g/dL   HCT 27.7 (L) 39.0 - 52.0 %   MCV 88.8 80.0 - 100.0 fL   MCH 28.5 26.0 - 34.0 pg   MCHC 32.1 30.0 - 36.0 g/dL   RDW 16.2 (H) 11.5 - 15.5 %   Platelets 111 (L) 150 - 400 K/uL    Comment: Immature Platelet Fraction may be clinically indicated, consider ordering this additional test [id number] REPEATED TO VERIFY PLATELET COUNT CONFIRMED BY SMEAR    nRBC 0.0 0.0 - 0.2 %   Neutrophils Relative % 70 %   Neutro Abs 2.4 1.7 - 7.7 K/uL   Lymphocytes Relative 16 %   Lymphs Abs 0.6 (L) 0.7 - 4.0 K/uL   Monocytes Relative 11 %   Monocytes Absolute 0.4 0.1 - 1.0 K/uL   Eosinophils Relative 1 %   Eosinophils Absolute 0.1 0.0 - 0.5 K/uL   Basophils Relative 0 %   Basophils Absolute 0.0 0.0 - 0.1 K/uL   Immature Granulocytes 2 %   Abs Immature Granulocytes 0.08 (H) 0.00 - 0.07 K/uL    Comment: Performed at [facility] [address] [place], [place] [address]  Glucose, capillary     Status: Abnormal   Collection Time: 07/20/21  6:18 AM  Result Value Ref Range   Glucose-Capillary 313 (H) 70 - 99 mg/dL    Comment: Glucose reference range applies only to samples taken after fasting for at least 8 hours.  Glucose, capillary     Status: Abnormal   Collection Time: 07/20/21  7:54 AM  Result Value Ref Range   Glucose-Capillary 285 (H) 70 - 99 mg/dL    Comment: Glucose reference range applies only to  samples taken after fasting for at least 8 hours.  Glucose, capillary     Status: Abnormal    Collection Time: 07/20/21 11:49 AM  Result Value Ref Range   Glucose-Capillary 241 (H) 70 - 99 mg/dL    Comment: Glucose reference range applies only to samples taken after fasting for at least 8 hours.  Glucose, capillary     Status: Abnormal   Collection Time: 07/20/21  5:23 PM  Result Value Ref Range   Glucose-Capillary 255 (H) 70 - 99 mg/dL    Comment: Glucose reference range applies only to samples taken after fasting for at least 8 hours.  Glucose, capillary     Status: Abnormal   Collection Time: 07/20/21  9:40 PM  Result Value Ref Range   Glucose-Capillary 332 (H) 70 - 99 mg/dL    Comment: Glucose reference range applies only to samples taken after fasting for at least 8 hours.  Glucose, capillary     Status: Abnormal   Collection Time: 07/21/21  7:10 AM  Result Value Ref Range   Glucose-Capillary 359 (H) 70 - 99 mg/dL    Comment: Glucose reference range applies only to samples taken after fasting for at least 8 hours.  Glucose, capillary     Status: Abnormal   Collection Time: 07/21/21 12:06 PM  Result Value Ref Range   Glucose-Capillary 292 (H) 70 - 99 mg/dL    Comment: Glucose reference range applies only to samples taken after fasting for at least 8 hours.  Glucose, capillary     Status: Abnormal   Collection Time: 07/21/21  4:29 PM  Result Value Ref Range   Glucose-Capillary 269 (H) 70 - 99 mg/dL    Comment: Glucose reference range applies only to samples taken after fasting for at least 8 hours.    DG Knee 1-2 Views Left  Result Date: 07/21/2021 CLINICAL DATA:  Left knee pain after attempting to walk in rehab. History of fall. EXAM: LEFT KNEE - 1-2 VIEW COMPARISON:  None. FINDINGS: Small knee joint effusion. No fracture or dislocation. No evidence of lipohemarthrosis. 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 No  evidence of chondrocalcinosis. Scattered adjacent vascular calcifications. No radiopaque foreign body. IMPRESSION: 1. Small knee joint effusion without associated fracture or lipohemarthrosis. 2. 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 Electronically Signed   By: Sandi Mariscal M.D.   On: 07/21/2021 16:56    Assessment/Plan: Patient has a large left lower extremity wound after necrotizing infection.  Operative reconstruction of this wound would a skin graft is indicated even in a patient with very significant comorbidities such as this patient.  Cardiology has seen him to assess his risk and has made some recommendations.  Following this we just need to plan for holding his anticoagulants for surgery which could be holding Lovenox or converting him to heparin drip and holding heparin for skin grafting of this rather large area.  Ward time:  52 minutes of ward time was spent face to face with this patient and reviewing his clinical records. Lennice Sites 07/21/2021, 8:22 PM

## 2021-07-21 NOTE — Progress Notes (Signed)
Occupational Therapy Session Note  Patient Details  Name: Tony Young MRN: [id number] Date of Birth: 03-Apr-1956  Today's Date: 07/21/2021 OT Individual Time: 9390-3009 OT Individual Time Calculation (min): 59 min    Short Term Goals: Week 1:  OT Short Term Goal 1 (Week 1): Patient will compelte UB bathing with Max A in supine. OT Short Term Goal 2 (Week 1): Patient will complete UB dressing wtih Max A in supine. OT Short Term Goal 3 (Week 1): Patient will maintain static sitting balance with Mod A seated EOB in prep for ADLs. OT Short Term Goal 4 (Week 1): Patient will complete sit to stand transfer with Max A +2 and LRAD.  Skilled Therapeutic Interventions/Progress Updates:  Patient met seated in TIS wc in agreement with OT treatment session. Moderate pain reported in L knee with movement. RN made aware. Patient doffed gown and donned overhead shirt with Total A (<25%). Sit to stand with stedy and Total A +2. In perched position in stedy patient reported increased pain in L knee. Total A t+2 o maintain upright position in stedy 2/2 generalized weakness/debility and anxiety. Time spent on reducing anxiety in prep for sit to stand for return to TIS wc. Patient panicking repeating "I can't, I can't" with eyes closed. After extended period of time, patient calm enough to attempt sit to stand from perched position in stedy. Total A +2 for return to TIS wc. Maximove utilized for transfer back to bed. Rolled R<>L in supine to remove lift pad with Mod A to R and +2 assist to roll to L. Patient expressed displeasure at current level of function but continues to demonstrate decreased insight and decreased motivation. Time spent re-educating patient on purpose of rehab, goals and ELOS. Session concluded with patient lying supine in bed with call bell within reach, bed alarm activated and all needs met.   Therapy Documentation Precautions:  Precautions Precautions: Fall Precaution Comments:  trach, PEG, unable to read, fragile skin Restrictions Weight Bearing Restrictions: No General:    Therapy/Group: Individual Therapy  Kanyon Bunn R Howerton-Davis 07/21/2021, 6:58 AM

## 2021-07-21 NOTE — Patient Care Conference (Signed)
Inpatient RehabilitationTeam Conference and Plan of Care Update Date: 07/21/2021   Time: 11:54 AM    Patient Name: Tony Young      Medical Record Number: [id number]  Date of Birth: 1955-10-07 Sex: Male         Room/Bed: 5C07C/5C07C-01 Payor Info: Payor: MEDICARE / Plan: MEDICARE PART A AND B / Product Type: *No Product type* /    Admit Date/Time:  07/15/2021  4:27 PM  Primary Diagnosis:  Critical illness myopathy  Hospital Problems: Principal Problem:   Critical illness myopathy    Expected Discharge Date: Expected Discharge Date:  (4 weeks)  Team Members Present: Physician leading conference: Dr. Courtney Heys Social Worker Present: Ovidio Kin, LCSW Nurse Present: Dorthula Nettles, RN PT Present: Ailene Rud, PT OT Present: Willeen Cass, OT SLP Present: Charolett Bumpers, SLP PPS Coordinator present : Gunnar Fusi, SLP     Current Status/Progress Goal Weekly Team Focus  Bowel/Bladder   incontinent bladder. continent bowel  regain continence  toilet q 2 hr and as needed   Swallow/Nutrition/ Hydration   Dys 2 textures and thin liquids, full supervision A  Supervision A  swallow strategies and trials of dys 3 textures   ADL's   Total A of 2 BADLs bedlevel  Min-Mod A overall  UE/LE strengthening, ADL retraining, core strengthening, functional transfers, functional cognition, activity tolerance   Mobility   mod A rolling, maxi move transfer,  mod A  transfers, OOB tolerance   Communication             Safety/Cognition/ Behavioral Observations  Mod A memory  Min A  functional memory strategies and possible memory notebook   Pain   5/10 pain  < 3  assess pain q 4hr and prn   Skin   Pressure injury to buttocks, incision LLE  no new breakdown/infection  assess q shift and prn     Discharge Planning:  HOme with daughter and son in-law along with adult son. Between all of them plan to provide 24/7 care. Pt very deconditioned from long hospital stay   Team  Discussion: Trach #6. Did not receive pain medication last evening or before dressing change this morning. Increased insulin. May need to consult plastics. Incontinent bladder. Reports 5/10 pain. Elevated CBG's. Unstageable to left buttock, treating as ordered. LLE wound, treating as ordered. No respiratory notes in chart since 07/16/2021. However, flow sheet is charted daily. Discharging home with children.   Patient on target to meet rehab goals: yes, min/mod assist goals. Currently total assist with ADL's. Maxi-move to get OOB today. Still have to work on memory, will begin when patient is at a higher level of function.  *See Care Plan and progress notes for long and short-term goals.   Revisions to Treatment Plan:  Adjusting medications.   Teaching Needs: Family education, medication/pain management, skin/wound care, transfer/gait training, etc.   Current Barriers to Discharge: Decreased caregiver support, Home enviroment access/layout, Trach, Wound care, Weight, and Weight bearing restrictions  Possible Resolutions to Barriers: Family education Follow-up OT/PT/SLP Follow-up wound care     Medical Summary Current Status: trach #6- necrotizing fasciitis- incontinent bowel; cotinent of bladder- has pressure ulcer on L trochanter; acute resp failure- PMV  Barriers to Discharge: Weight bearing restrictions;Weight;Decreased family/caregiver support;Home enviroment access/layout;Incontinence;Medical stability;Wound care;Other (comments);Trach  Barriers to Discharge Comments: going home wiht multiple family - is total assist at bedlevel- goals min-mod A- possible surgical intervention for LLE Possible Resolutions to [employer] Focus: severe ICU myopathy- limitaitons severe qweakness-  wounds; SLUMS 18/30- - 4 weeks   Continued Need for Acute Rehabilitation Level of Care: The patient requires daily medical management by a physician with specialized training in physical medicine and  rehabilitation for the following reasons: Direction of a multidisciplinary physical rehabilitation program to maximize functional independence : Yes Medical management of patient stability for increased activity during participation in an intensive rehabilitation regime.: Yes Analysis of laboratory values and/or radiology reports with any subsequent need for medication adjustment and/or medical intervention. : Yes   I attest that I was present, lead the team conference, and concur with the assessment and plan of the team.   Cristi Loron 07/21/2021, 4:06 PM

## 2021-07-21 NOTE — Progress Notes (Signed)
Pt states he is not requiring suctioning at this time.  PMV in place until patient is ready for sleep.

## 2021-07-21 NOTE — Plan of Care (Signed)
°  Problem: RH SKIN INTEGRITY Goal: RH STG MAINTAIN SKIN INTEGRITY WITH ASSISTANCE Description: STG Maintain Skin Integrity With Mod Assistance. Outcome: Not Progressing; hydrotherapy - back; every other day dressing change left leg

## 2021-07-21 NOTE — Progress Notes (Signed)
Physical Therapy Wound Treatment Patient Details  Name: Tony Young MRN: [id number] Date of Birth: June 06, 1955  Today's Date: 07/21/2021 PT Individual Time: 1100-1140 PT Individual Time Calculation (min): 40 min   Subjective  Subjective: Pt pleasant and agreeable to hydrotherapy Patient and Family Stated Goals: Heal wound Date of Onset:  (Unknown) Prior Treatments: Dressing changes  Pain Score:  Pt tolerated treatment well without complaints of pain.  Wound Assessment  Pressure Injury 07/15/21 Buttocks Left Unstageable - Full thickness tissue loss in which the base of the injury is covered by slough (yellow, tan, gray, green or brown) and/or eschar (tan, brown or black) in the wound bed. (Active)  Dressing Type Foam - Lift dressing to assess site every shift;Gauze (Comment);Santyl;Barrier Film (skin prep);Moist to moist 07/21/21 1505  Dressing Changed;Clean, Dry, Intact 07/21/21 1505  Dressing Change Frequency Daily 07/21/21 1505  State of Healing Eschar 07/21/21 1505  Site / Wound Assessment Pink;Yellow;Red;Bleeding;Painful 07/21/21 1505  % Wound base Red or Granulating 5% 07/21/21 1505  % Wound base Yellow/Fibrinous Exudate 90% 07/21/21 1505  % Wound base Black/Eschar 5% 07/21/21 1505  % Wound base Other/Granulation Tissue (Comment) 0% 07/21/21 1505  Peri-wound Assessment Intact 07/21/21 1505  Wound Length (cm) 7.2 cm 07/17/21 1518  Wound Width (cm) 7 cm 07/17/21 1518  Wound Depth (cm) 0.2 cm 07/17/21 1518  Wound Surface Area (cm^2) 50.4 cm^2 07/17/21 1518  Wound Volume (cm^3) 10.08 cm^3 07/17/21 1518  Tunneling (cm) 0 07/17/21 1518  Undermining (cm) 0 07/17/21 1518  Margins Unattached edges (unapproximated) 07/21/21 1505  Drainage Amount Minimal 07/21/21 1505  Drainage Description Serosanguineous 07/21/21 1505  Treatment Debridement (Selective);Hydrotherapy (Pulse lavage);Packing (Saline gauze) 07/21/21 1505      Hydrotherapy Pulsed lavage therapy - wound  location: L buttock Pulsed Lavage with Suction (psi): 12 psi Pulsed Lavage with Suction - Normal Saline Used: 1000 mL Pulsed Lavage Tip: Tip with splash shield Selective Debridement Selective Debridement - Location: L buttock Selective Debridement - Tools Used: Forceps;Scalpel Selective Debridement - Tissue Removed: yellow eschar,slough   Wound Assessment and Plan  Wound Therapy - Assess/Plan/Recommendations Wound Therapy - Clinical Statement: Progressing with debridement of adherent non-viable tissue. This patient will benefit from contiuned hydrotherapy for selective removal of necrotic tissue, to decrease bioburden, and promote wound healing. Wound Therapy - Functional Problem List: Immobility Factors Delaying/Impairing Wound Healing: Diabetes Mellitus;Immobility;Multiple medical problems;Polypharmacy Hydrotherapy Plan: Debridement;Dressing change;Patient/family education;Pulsatile lavage with suction Wound Therapy - Frequency: 6X / week Wound Therapy - Follow Up Recommendations: dressing changes by RN  Wound Therapy Goals- Improve the function of patient's integumentary system by progressing the wound(s) through the phases of wound healing (inflammation - proliferation - remodeling) by: Decrease Necrotic Tissue to: 20% Decrease Necrotic Tissue - Progress: Progressing toward goal Increase Granulation Tissue to: 80% Increase Granulation Tissue - Progress: Progressing toward goal Goals/treatment plan/discharge plan were made with and agreed upon by patient/family: Yes Time For Goal Achievement: 7 days Wound Therapy - Potential for Goals: Good  Goals will be updated until maximal potential achieved or discharge criteria met.  Discharge criteria: when goals achieved, discharge from hospital, MD decision/surgical intervention, no progress towards goals, refusal/missing three consecutive treatments without notification or medical reason.  GP     [name] 07/21/2021, 3:09  PM  Rolinda Roan, PT, DPT Acute Rehabilitation Services Pager: [phone] Office: [phone]

## 2021-07-21 NOTE — Progress Notes (Signed)
Physical Therapy Session Note  Patient Details  Name: Tony Young MRN: [id number] Date of Birth: [birth year]  Today's Date: 07/21/2021 PT Individual Time: 0806-0850 PT Individual Time Calculation (min): 44 min   Short Term Goals: Week 1:  PT Short Term Goal 1 (Week 1): Pt will complete bed mobility with maxA of 1 person PT Short Term Goal 2 (Week 1): Pt will complete bed<>chair transfer with maxA +2 PT Short Term Goal 3 (Week 1): Pt will complete sit<>stand transfer with maxA +2 PT Short Term Goal 4 (Week 1): Pt will tolerate sitting in TIS w/c for >2 hrs outside of therapy  Skilled Therapeutic Interventions/Progress Updates:    pt received in bed and agreeable to therapy. No complaint of pain. Nsg present to dress LLE wound. Pt with no signs/symptoms of pain this AM. Portion of session spent coordinating care to get feeding tube disconnected and retrieving equipment (slideboard and maxi sling). When preparing to transfer pt reports wet brief. Dependent brief change with tot for hygiene and mod A x 2 for rolling. Ultimately, pt required maxi move transfer to [place] chair with +2 for safety and extended time. Handed off to NT for full supervision breakfast.   Therapy Documentation Precautions:  Precautions Precautions: Fall Precaution Comments: trach, PEG, unable to read, fragile skin Restrictions Weight Bearing Restrictions: No General:        Therapy/Group: Individual Therapy  Mickel Fuchs 07/21/2021, 10:27 AM

## 2021-07-21 NOTE — Progress Notes (Addendum)
Progress Note  Patient Name: Tony Young Date of Encounter: 07/21/2021  Primary Cardiologist: None   Subjective   Breathing is at baseline, has not had chest pain when working with rehab.  However, he is very weak and not able to stand.  He has not made much progress since being transferred to rehab.  Inpatient Medications    Scheduled Meds:  amantadine  50 mg Oral Daily   amiodarone  200 mg Oral BID   vitamin C  500 mg Oral BID   busPIRone  7.5 mg Oral BID   cholecalciferol  1,000 Units Oral Daily   collagenase   Topical Daily   enoxaparin (LOVENOX) injection  120 mg Subcutaneous BID   feeding supplement  237 mL Oral BID BM   feeding supplement (OSMOLITE 1.5 CAL)  900 mL Per Tube Q24H   free water  100 mL Per Tube Q6H   insulin aspart  0-15 Units Subcutaneous TID WC   insulin aspart  0-5 Units Subcutaneous QHS   insulin glargine-yfgn  24 Units Subcutaneous QHS   iron polysaccharides  150 mg Oral QPC supper   latanoprost  1 drop Both Eyes QHS   levothyroxine  75 mcg Oral Q0600   loratadine  10 mg Oral Daily   melatonin  3 mg Oral QHS   metoprolol tartrate  50 mg Oral BID   multivitamin with minerals  1 tablet Oral Daily   nutrition supplement (JUVEN)  1 packet Per Tube BID BM   pantoprazole  40 mg Oral BID AC   polycarbophil  625 mg Oral BID   predniSONE  10 mg Oral Q breakfast   pregabalin  50 mg Oral Daily   primidone  50 mg Oral Daily   risperiDONE  1 mg Oral BID   sertraline  100 mg Oral QHS   zinc sulfate  220 mg Oral Daily   Continuous Infusions:  PRN Meds: acetaminophen, alum & mag hydroxide-simeth, bisacodyl, diphenhydrAMINE, guaiFENesin-dextromethorphan, ipratropium-albuterol, naphazoline-glycerin, oxyCODONE, polyethylene glycol, prochlorperazine **OR** prochlorperazine **OR** prochlorperazine, silver nitrate applicators, sodium phosphate, traZODone   Vital Signs    Vitals:   07/21/21 0855 07/21/21 1204 07/21/21 1215 07/21/21 1317  BP:     108/63  Pulse: (!) 101 (!) 111 98 (!) 102  Resp: 17 16  20   Temp:    98.1 F (36.7 C)  TempSrc:    Oral  SpO2: 100% 100%  100%  Weight:      Height:        Intake/Output Summary (Last 24 hours) at 07/21/2021 1400 Last data filed at 07/21/2021 1230 Gross per 24 hour  Intake 600 ml  Output 300 ml  Net 300 ml   Filed Weights   07/15/21 1700 07/18/21 0350 07/21/21 0500  Weight: 116.1 kg 116.4 kg (S) 116.2 kg    Telemetry    Not on - Personally Reviewed  ECG    None today  - Personally Reviewed  Physical Exam   GEN: No acute distress, trach in place.   Neck: No JVD seen, difficult to assess 2nd body habitus; trach in place Cardiac: Irreg R&R, no murmur, no rubs, or gallops.  Respiratory: diminished to auscultation bilaterally with rales in the bases. GI: Soft, nontender, non-distended  MS: +pedal edema; lower legs bandaged and dressed, not disturbed, legs to weak to hold him 2nd pain, arms are weak Neuro:  Nonfocal  Psych: Normal affect    Labs    Chemistry Recent Labs  Lab 07/15/21 [phone]  07/16/21 0612 07/19/21 0723 07/20/21 0555  NA  --  138  --  132*  K 4.0 3.9  --  3.9  CL  --  102  --  96*  CO2  --  24  --  25  GLUCOSE  --  115* 431* 336*  BUN  --  40*  --  19  CREATININE  --  1.20  --  1.16  CALCIUM  --  10.2  --  8.3*  PROT  --  5.7*  --   --   ALBUMIN  --  2.6*  --   --   AST  --  17  --   --   ALT  --  26  --   --   ALKPHOS  --  87  --   --   BILITOT  --  0.3  --   --   GFRNONAA  --  >60  --  >60  ANIONGAP  --  12  --  11     Hematology Recent Labs  Lab 07/16/21 0612 07/18/21 0552 07/20/21 0555  WBC 2.9* 3.4* 3.5*  RBC 3.19* 3.22* 3.12*  HGB 9.0* 9.1* 8.9*  HCT 28.6* 29.0* 27.7*  MCV 89.7 90.1 88.8  MCH 28.2 28.3 28.5  MCHC 31.5 31.4 32.1  RDW 16.0* 16.1* 16.2*  PLT 166 PLATELET CLUMPS NOTED ON SMEAR, UNABLE TO ESTIMATE 111*    Cardiac EnzymesNo results for input(s): TROPONINI in the last 168 hours. No results for input(s):  TROPIPOC in the last 168 hours.   BNPNo results for input(s): BNP, PROBNP in the last 168 hours.   DDimer No results for input(s): DDIMER in the last 168 hours.   Radiology    No results found.  Cardiac Studies   LE Dopplers: 07/16/2021 Summary:  RIGHT:  - Findings consistent with age indeterminate deep vein thrombosis  involving the right common femoral vein, SF junction, right femoral vein,  right proximal profunda vein, right popliteal vein, and right posterior  tibial veins.  - No cystic structure found in the popliteal fossa.     LEFT:  - Findings consistent with age indeterminate deep vein thrombosis  involving the left common femoral vein, and SF junction.  - No cystic structure found in the popliteal fossa.      Patient Profile     66 y.o. male history of COPD, OSA, CAD, CHF, type 2 diabetes and atrial fibrillation.  He went to the hospital in [place] on 05/13/2022 for shortness of breath, fever and necrotizing fasciitis of the left lower extremity.  The course was complicated by AKI requiring HD, respiratory failure requiring a trach on 12/27, sepsis/shock, GIB, bacteremia.   He was transferred to [facility] on 06/10/2021.  He was capped trach during the day, a few nights.  Completed antibiotics.  He had required GIP.  His renal function was stable.  Thrombocytopenia improved.  His EF was 30-35% with moderate MR. He developed pulmonary edema/pulmonary effusion and was treated with Lasix and improved.  On 2/15, he was transferred from [facility], to [facility] inpatient rehab.  He still has significant lower extremity wound with circumferential skin loss on his lower leg.  He was diagnosed with a DVT on 2/16 and is on treatment dose Lovenox.  Plastic surgeons have discussed doing a skin graft (type unclear), cardiac clearance is requested.  Of note, Pulmonary has seen today for recommendations for his tracheostomy.   Assessment & Plan    Paroxysmal atrial fibrillation- -  HR irregular on exam and HR is 90s-100s - amio now at 200 mg qd, was not on this when transferred and on 1/11, but it was on his medication list starting 1/12 - Tolerating metoprolol 50 mg twice daily - We will recheck ECG - He is on full-strength anticoagulation with Lovenox at this time  DVT: -- Continue therapeutic Lovenox - would prefer not to hold the Lovenox until the DVT is confirmed to be resolved  Chronic diastolic heart failure - weight has not changed significantly since admit to rehab - however, he has pedal edema and some rales in his bases - he got a dose of Lasix 20 mg IV on 02/16, but is not currently on a diuretic - Cr has been stable since admit - discuss w/ MD restarting Lasix in some form - EF was 30-35% in [place] in December, will recheck  Hx GIB: - was one of the complications in [place] - he is anemic, but H&H have not changed much since admit  Preop Cardiac evaluation - f/u echo - no ischemic sx - discussed w/ pt that his risk is increased, but we cannot ameliorate that, as long as his meds are optimized.  - MD to review and advise if the risk is prohibitive - pt says cardiologist in [place] told him he could not be put to sleep, he would not come off the vent.   For questions or updates, please contact [place] Please consult [email or web address] for contact info under Cardiology/STEMI.      Signed, Rosaria Ferries, PA-C  07/21/2021, 2:00 PM     ATTENDING ATTESTATION:  After conducting a review of all available clinical information with the care team, interviewing the patient, and performing a physical exam, I agree with the findings and plan described in this note.   GEN: No acute distress.   Cardiac: RRR, no murmurs, rubs, or gallops.  Respiratory: Ant lung fields coarse GI: Soft, obese, nontender, non-distended  MS: R LE wrapped Neuro: Diffusely weak Vasc:  +1 radial pulses  The patient is a 66 year old male who has had a long complicated  medical course over the last several months.  He was seen by the cardiology practice last week due to atrial fibrillation.  He has a history of coronary artery disease on cardiac catheterization in 2017 demonstrating a 30% distal left main lesion, 30% proximal LAD lesion, along proximal left circumflex 90% lesion, and OM1 55% lesion, proximal right coronary artery lesion of 40% and mid right coronary artery lesion of 70%, PLV lesion of 80% and a ramus lesion of 90%.  His echocardiogram done elsewhere in 2021 demonstrated normal ejection fraction with an EF of 55 to 60%.  In December he was admitted due to necrotizing fasciitis of his left lower extremity and developed acute respiratory failure, multiorgan dysfunction, GI bleed, shock liver, and ultimately underwent tracheostomy placement.  He was discharged to a [facility].  He was then admitted to the [facility] service on February 15.  He tells me prior to his presentation a few months ago he was doing most everything that he needs to do on a day.  [place] and was [place] to it without any difficulties.  He will occasionally get short of breath with wheezing but denied any exertional angina.  Given these clinical factors will refer for a Lexiscan stress test and echocardiogram to assess for high risk findings.  I discussed this with the patient.  We will follow along with  you.  Lenna Sciara, MD Pager [phone]

## 2021-07-22 ENCOUNTER — Inpatient Hospital Stay (HOSPITAL_COMMUNITY): Payer: Medicare Other

## 2021-07-22 DIAGNOSIS — I5021 Acute systolic (congestive) heart failure: Secondary | ICD-10-CM | POA: Diagnosis not present

## 2021-07-22 DIAGNOSIS — Z01810 Encounter for preprocedural cardiovascular examination: Secondary | ICD-10-CM | POA: Diagnosis not present

## 2021-07-22 DIAGNOSIS — I480 Paroxysmal atrial fibrillation: Secondary | ICD-10-CM

## 2021-07-22 DIAGNOSIS — G7281 Critical illness myopathy: Secondary | ICD-10-CM | POA: Diagnosis not present

## 2021-07-22 DIAGNOSIS — I5032 Chronic diastolic (congestive) heart failure: Secondary | ICD-10-CM

## 2021-07-22 LAB — NM MYOCAR MULTI W/SPECT W/WALL MOTION / EF
Estimated workload: 1
Exercise duration (min): 0 min
Exercise duration (sec): 0 s
MPHR: 155 {beats}/min
Peak HR: 129 {beats}/min
Percent HR: 83 %
Rest HR: 110 {beats}/min

## 2021-07-22 LAB — ECHOCARDIOGRAM COMPLETE
AR max vel: 2.61 cm2
AV Area VTI: 1.96 cm2
AV Area mean vel: 2.36 cm2
AV Mean grad: 3 mmHg
AV Peak grad: 5.3 mmHg
Ao pk vel: 1.15 m/s
Calc EF: 48.3 %
Height: 67 in
S' Lateral: 4.3 cm
Single Plane A2C EF: 51.3 %
Single Plane A4C EF: 44.7 %
Weight: 3968 oz

## 2021-07-22 LAB — GLUCOSE, CAPILLARY
Glucose-Capillary: 159 mg/dL — ABNORMAL HIGH (ref 70–99)
Glucose-Capillary: 161 mg/dL — ABNORMAL HIGH (ref 70–99)
Glucose-Capillary: 244 mg/dL — ABNORMAL HIGH (ref 70–99)

## 2021-07-22 MED ORDER — TECHNETIUM TC 99M TETROFOSMIN IV KIT
10.0000 | PACK | Freq: Once | INTRAVENOUS | Status: AC | PRN
  Administered 2021-07-22: 10 via INTRAVENOUS

## 2021-07-22 MED ORDER — EMPAGLIFLOZIN 10 MG PO TABS
10.0000 mg | ORAL_TABLET | Freq: Every day | ORAL | Status: DC
  Administered 2021-07-22 – 2021-07-28 (×5): 10 mg via ORAL
  Filled 2021-07-22 (×7): qty 1

## 2021-07-22 MED ORDER — REGADENOSON 0.4 MG/5ML IV SOLN
0.4000 mg | Freq: Once | INTRAVENOUS | Status: AC
  Administered 2021-07-22: 0.4 mg via INTRAVENOUS
  Filled 2021-07-22: qty 5

## 2021-07-22 MED ORDER — TECHNETIUM TC 99M TETROFOSMIN IV KIT
30.8000 | PACK | Freq: Once | INTRAVENOUS | Status: AC | PRN
  Administered 2021-07-22: 30.8 via INTRAVENOUS

## 2021-07-22 MED ORDER — REGADENOSON 0.4 MG/5ML IV SOLN
INTRAVENOUS | Status: AC
  Filled 2021-07-22: qty 5

## 2021-07-22 MED ORDER — FUROSEMIDE 20 MG PO TABS
20.0000 mg | ORAL_TABLET | Freq: Two times a day (BID) | ORAL | Status: DC
  Administered 2021-07-22 – 2021-07-29 (×14): 20 mg via ORAL
  Filled 2021-07-22 (×14): qty 1

## 2021-07-22 MED ORDER — FERROUS SULFATE 300 (60 FE) MG/5ML PO SYRP
300.0000 mg | ORAL_SOLUTION | Freq: Two times a day (BID) | ORAL | Status: DC
  Administered 2021-07-22 – 2021-07-28 (×12): 300 mg
  Filled 2021-07-22 (×16): qty 5

## 2021-07-22 MED ORDER — PANTOPRAZOLE 2 MG/ML SUSPENSION
40.0000 mg | Freq: Two times a day (BID) | ORAL | Status: DC
Start: 2021-07-22 — End: 2021-07-29
  Administered 2021-07-22 – 2021-07-28 (×13): 40 mg
  Filled 2021-07-22 (×15): qty 20

## 2021-07-22 NOTE — Progress Notes (Signed)
ANTICOAGULATION CONSULT NOTE - Follow-up Consult  Pharmacy Consult for Lovenox Indication: DVT  Allergies  Allergen Reactions   Statins Other (See Comments)    Made hands swell   Penicillins Nausea And Vomiting and Other (See Comments)    Other reaction(s): GI intolerance Other reaction(s): Nausea And Vomiting, Other (See Comments), Other (see comments) Other reaction(s): Other (See Comments) Other reaction(s): Nausea And Vomiting, Other (See Comments), Other (see comments) Other reaction(s): Nausea And Vomiting, Other (See Comments), Other (see comments)    Ropinirole Other (See Comments)    Patient Measurements: Height: 5\' 7"  (170.2 cm) Weight: 112.5 kg (248 lb) IBW/kg (Calculated) : 66.1 Heparin Dosing Weight:   Vital Signs: Temp: 98.5 F (36.9 C) (02/22 0345) Temp Source: Oral (02/22 0345) BP: 116/60 (02/22 0345) Pulse Rate: 97 (02/22 0820)  Labs: Recent Labs    07/20/21 0555  HGB 8.9*  HCT 27.7*  PLT 111*  CREATININE 1.16     Estimated Creatinine Clearance: 76.1 mL/min (by C-G formula based on SCr of 1.16 mg/dL).   Medical History: Past Medical History:  Diagnosis Date   CAD (coronary artery disease)    Cardiomyopathy ([place])    Colon cancer (HCC)    COPD (chronic obstructive pulmonary disease) (HCC)    OSA (obstructive sleep apnea)    Type 2 diabetes mellitus ([place])     Assessment: 33 YOM with complicated medical hx who was recently a [facility] before transferring to [facility]. He was then admitted to CIR. He had some recent hx of GIB. Dopplers showed DVT. Lovenox continued for anticoagulation.   Hgb 8.9 and stable on last CBC (2/20), platelets within normal limits. Renal function still appears to remain at baseline with Scr 1.16  Goal of Therapy:  Monitor platelets by anticoagulation protocol: Yes   Plan:  Continue Lovenox 120mg  SQ BID Weekly CBC (next due 2/27) Monitor signs and symptoms of bleeding F/u oral AC plan  Thank you  for involving pharmacy in this patient's care.  Vaughan Basta BS, PharmD, BCPS Clinical Pharmacist 07/22/2021 8:39 AM  **Pharmacist phone directory can be found on [email or web address] listed under [place]**

## 2021-07-22 NOTE — Plan of Care (Signed)
°  Problem: RH BOWEL ELIMINATION Goal: RH STG MANAGE BOWEL WITH ASSISTANCE Description: STG Manage Bowel with Mod Assistance. Outcome: Not ProgressingLBM 2/19;    Problem: RH BLADDER ELIMINATION Goal: RH STG MANAGE BLADDER WITH ASSISTANCE Description: STG Manage Bladder With Mod Assistance Outcome: Not Progressing; incontinence at times   Problem: RH SKIN INTEGRITY Goal: RH STG MAINTAIN SKIN INTEGRITY WITH ASSISTANCE Description: STG Maintain Skin Integrity With Mod Assistance. Outcome: Not Progressing; ongoing hydrotherapy and BID dressing changes on leg

## 2021-07-22 NOTE — Progress Notes (Signed)
Progress Note  Patient Name: Tony Young Date of Encounter: 07/22/2021  [facility] Cardiologist: None   Subjective   No acute acute events.  The patient is without significant complaints today.  Inpatient Medications    Scheduled Meds:  amantadine  50 mg Oral Daily   amiodarone  200 mg Oral BID   vitamin C  500 mg Oral BID   busPIRone  7.5 mg Oral BID   cholecalciferol  1,000 Units Oral Daily   collagenase   Topical Daily   enoxaparin (LOVENOX) injection  120 mg Subcutaneous BID   feeding supplement  237 mL Oral BID BM   feeding supplement (OSMOLITE 1.5 CAL)  900 mL Per Tube Q24H   ferrous sulfate  300 mg Per Tube BID WC   free water  100 mL Per Tube Q6H   insulin aspart  0-15 Units Subcutaneous TID WC   insulin aspart  0-5 Units Subcutaneous QHS   insulin glargine-yfgn  24 Units Subcutaneous QHS   latanoprost  1 drop Both Eyes QHS   levothyroxine  75 mcg Oral Q0600   loratadine  10 mg Oral Daily   melatonin  3 mg Oral QHS   metoprolol tartrate  50 mg Oral BID   multivitamin with minerals  1 tablet Oral Daily   nutrition supplement (JUVEN)  1 packet Per Tube BID BM   pantoprazole sodium  40 mg Per Tube BID   polycarbophil  625 mg Oral BID   predniSONE  10 mg Oral Q breakfast   pregabalin  50 mg Oral Daily   primidone  50 mg Oral Daily   risperiDONE  1 mg Oral BID   sertraline  100 mg Oral QHS   traMADol  50 mg Oral Q6H   zinc sulfate  220 mg Oral Daily   Continuous Infusions:  PRN Meds: acetaminophen, alum & mag hydroxide-simeth, bisacodyl, diphenhydrAMINE, guaiFENesin-dextromethorphan, ipratropium-albuterol, naphazoline-glycerin, oxyCODONE, polyethylene glycol, prochlorperazine **OR** prochlorperazine **OR** prochlorperazine, silver nitrate applicators, sodium phosphate, traZODone   Vital Signs    Vitals:   07/22/21 1346 07/22/21 1349 07/22/21 1351 07/22/21 1353  BP: (!) 112/49 (!) 119/58 109/60 109/63  Pulse: (!) 102 (!) 104 (!) 102 (!) 104   Resp:      Temp:      TempSrc:      SpO2:      Weight:      Height:        Intake/Output Summary (Last 24 hours) at 07/22/2021 1502 Last data filed at 07/22/2021 0900 Gross per 24 hour  Intake 360 ml  Output 775 ml  Net -415 ml   Last 3 Weights 07/22/2021 07/21/2021 07/18/2021  Weight (lbs) 248 lb 256 lb 2.8 oz 256 lb 9.9 oz  Weight (kg) 112.492 kg 116.2 kg 116.4 kg      Telemetry    No telemetry   ECG    AF RVR- Personally Reviewed  Physical Exam   GEN: No acute distress.  Trach in place Neck: No JVD Cardiac: Irregular RR, no murmurs, rubs, or gallops.  Respiratory: Coarse bilaterally GI: Soft, nontender, non-distended  MS: +2 edema with lower legs bandaged Neuro: Diffusely weak Psych: Normal affect    Labs    High Sensitivity Troponin:  No results for input(s): TROPONINIHS in the last 720 hours.   Chemistry Recent Labs  Lab 07/16/21 0612 07/19/21 0723 07/20/21 0555  NA 138  --  132*  K 3.9  --  3.9  CL 102  --  96*  CO2 24  --  25  GLUCOSE 115* 431* 336*  BUN 40*  --  19  CREATININE 1.20  --  1.16  CALCIUM 10.2  --  8.3*  PROT 5.7*  --   --   ALBUMIN 2.6*  --   --   AST 17  --   --   ALT 26  --   --   ALKPHOS 87  --   --   BILITOT 0.3  --   --   GFRNONAA >60  --  >60  ANIONGAP 12  --  11    Lipids No results for input(s): CHOL, TRIG, HDL, LABVLDL, LDLCALC, CHOLHDL in the last 168 hours.  Hematology Recent Labs  Lab 07/16/21 0612 07/18/21 0552 07/20/21 0555  WBC 2.9* 3.4* 3.5*  RBC 3.19* 3.22* 3.12*  HGB 9.0* 9.1* 8.9*  HCT 28.6* 29.0* 27.7*  MCV 89.7 90.1 88.8  MCH 28.2 28.3 28.5  MCHC 31.5 31.4 32.1  RDW 16.0* 16.1* 16.2*  PLT 166 PLATELET CLUMPS NOTED ON SMEAR, UNABLE TO ESTIMATE 111*   Thyroid No results for input(s): TSH, FREET4 in the last 168 hours.  BNPNo results for input(s): BNP, PROBNP in the last 168 hours.  DDimer No results for input(s): DDIMER in the last 168 hours.   Radiology      Cardiac Studies    ECHOCARDIOGRAM COMPLETE Result Date: 07/22/2021    1. Left ventricular ejection fraction, by estimation, is 40 to 45%. The  left ventricle has mildly decreased function. Left ventricular endocardial  border not optimally defined to evaluate regional wall motion. There is  mild left ventricular hypertrophy.   Left ventricular diastolic parameters are indeterminate.   2. Right ventricular systolic function is normal. The right ventricular  size is normal.   3. The mitral valve is normal in structure. No evidence of mitral valve  regurgitation. No evidence of mitral stenosis.   4. The aortic valve is calcified. There is mild calcification of the  aortic valve. There is mild thickening of the aortic valve. Aortic valve  regurgitation is not visualized. Aortic valve sclerosis is present, with  no evidence of aortic valve stenosis.   5. The inferior vena cava is normal in size with greater than 50%  respiratory variability, suggesting right atrial pressure of 3 mmHg.   Lexiscan uninterpretable  Patient Profile     66 y.o. male history of COPD, OSA, CAD, CHF, type 2 diabetes and atrial fibrillation here after complicated hospitalization including necrotizing fasciitis of his left lower extremity and developed acute respiratory failure, multiorgan dysfunction, GI bleed, shock liver, and ultimately underwent tracheostomy placement.  The patient is now being considered for skin grafting and Cardiology is consulted for preoperative CV risk assessment.  Assessment & Plan    Paroxysmal atrial fibrillation:  Cont metoprolol and LMWH   Chronic diastolic and systolic heart failure.  EF mildly diminished.  Start Jardiance 10mg  qday, cont metoprolol.  Start lasix 20 PO BID to keep net even.   Preop Cardiac evaluation:  Echo with only mildly diminished function.   Given lack of adequate imaging with nuclear stress test we will proceed to coronary CTA to evaluate further.  Only if he has high risk  anatomy would any further cardiac evaluation or intervention be required.     For questions or updates, please contact [place] Please consult [email or web address] for contact info under        Signed, Early Osmond, MD  07/22/2021, 3:02  PM

## 2021-07-22 NOTE — Progress Notes (Signed)
Per Nuclear med ok to give lovenox, novolog and meds with sips of water.

## 2021-07-22 NOTE — Progress Notes (Signed)
° °  Echocardiogram 2D Echocardiogram has been performed.  Tony Young 07/22/2021, 9:43 AM

## 2021-07-22 NOTE — Progress Notes (Signed)
Physical Therapy Wound Treatment Patient Details  Name: Tony Young MRN: [id number] Date of Birth: 05/07/1956  Today's Date: 07/22/2021 PT Individual Time: 1053-1130 PT Individual Time Calculation (min): 37 min   Subjective  Subjective: Pt pleasant and agreeable to hydrotherapy Patient and Family Stated Goals: Heal wound Date of Onset:  (Unknown) Prior Treatments: Dressing changes  Pain Score:  Pt reports mild pain occasionally but overall tolerated treatment well.   Wound Assessment  Pressure Injury 07/15/21 Buttocks Left Unstageable - Full thickness tissue loss in which the base of the injury is covered by slough (yellow, tan, gray, green or brown) and/or eschar (tan, brown or black) in the wound bed. (Active)  Dressing Type Foam - Lift dressing to assess site every shift;Gauze (Comment);Santyl;Barrier Film (skin prep);Moist to moist 07/22/21 1415  Dressing Clean, Dry, Intact;Changed 07/22/21 1415  Dressing Change Frequency Daily 07/22/21 1415  State of Healing Eschar 07/22/21 1415  Site / Wound Assessment Pink;Yellow;Red;Bleeding;Painful;Black 07/22/21 1415  % Wound base Red or Granulating 15% 07/22/21 1415  % Wound base Yellow/Fibrinous Exudate 80% 07/22/21 1415  % Wound base Black/Eschar 5% 07/22/21 1415  % Wound base Other/Granulation Tissue (Comment) 0% 07/22/21 1415  Peri-wound Assessment Intact 07/22/21 1415  Wound Length (cm) 7.2 cm 07/17/21 1518  Wound Width (cm) 7 cm 07/17/21 1518  Wound Depth (cm) 0.2 cm 07/17/21 1518  Wound Surface Area (cm^2) 50.4 cm^2 07/17/21 1518  Wound Volume (cm^3) 10.08 cm^3 07/17/21 1518  Tunneling (cm) 0 07/17/21 1518  Undermining (cm) 0 07/17/21 1518  Margins Unattached edges (unapproximated) 07/22/21 1415  Drainage Amount Minimal 07/22/21 1415  Drainage Description Serosanguineous 07/22/21 1415  Treatment Debridement (Selective);Hydrotherapy (Pulse lavage);Packing (Saline gauze) 07/22/21 1415      Hydrotherapy Pulsed lavage  therapy - wound location: L buttock Pulsed Lavage with Suction (psi): 12 psi Pulsed Lavage with Suction - Normal Saline Used: 1000 mL Pulsed Lavage Tip: Tip with splash shield Selective Debridement Selective Debridement - Location: L buttock Selective Debridement - Tools Used: Forceps;Scalpel Selective Debridement - Tissue Removed: yellow eschar, slough   Wound Assessment and Plan  Wound Therapy - Assess/Plan/Recommendations Wound Therapy - Clinical Statement: Progressing with debridement of adherent non-viable tissue. Increased bleeding noted with debridement this session, and silver nitrate utilized to control. This patient will benefit from contiuned hydrotherapy for selective removal of necrotic tissue, to decrease bioburden, and promote wound healing. Wound Therapy - Functional Problem List: Immobility Factors Delaying/Impairing Wound Healing: Diabetes Mellitus;Immobility;Multiple medical problems;Polypharmacy Hydrotherapy Plan: Debridement;Dressing change;Patient/family education;Pulsatile lavage with suction Wound Therapy - Frequency: 6X / week Wound Therapy - Follow Up Recommendations: dressing changes by RN  Wound Therapy Goals- Improve the function of patient's integumentary system by progressing the wound(s) through the phases of wound healing (inflammation - proliferation - remodeling) by: Decrease Necrotic Tissue to: 20% Decrease Necrotic Tissue - Progress: Progressing toward goal Increase Granulation Tissue to: 80% Increase Granulation Tissue - Progress: Progressing toward goal Goals/treatment plan/discharge plan were made with and agreed upon by patient/family: Yes Time For Goal Achievement: 7 days Wound Therapy - Potential for Goals: Good  Goals will be updated until maximal potential achieved or discharge criteria met.  Discharge criteria: when goals achieved, discharge from hospital, MD decision/surgical intervention, no progress towards goals, refusal/missing three  consecutive treatments without notification or medical reason.  GP     [name] 07/22/2021, 2:19 PM  Rolinda Roan, PT, DPT Acute Rehabilitation Services Pager: [phone] Office: [phone]

## 2021-07-22 NOTE — Progress Notes (Signed)
° °  Patient presented for a nuclear stress test today. No immediate complications. Stress imaging is pending at this time.   Preliminary EKG findings may be listed in the chart, but the stress test result will not be finalized until perfusion imaging is complete.  Darreld Mclean, PA-C 07/22/2021 2:02 PM

## 2021-07-22 NOTE — Progress Notes (Signed)
PROGRESS NOTE   Subjective/Complaints:  Pt reports pain is an issue this AM- hasn't had due to NPO- will change to meds with sips.  Got elbow pads.  LBM overnight.  L knee pain awful- wondering if can assess it- looked over xray and results- mainly chronic changes and some swelling c/w a strain.     ROS:  Pt denies SOB, abd pain, CP, N/V/C/D, and vision changes      Objective:   DG Knee 1-2 Views Left  Result Date: 07/21/2021 CLINICAL DATA:  Left knee pain after attempting to walk in rehab. History of fall. EXAM: LEFT KNEE - 1-2 VIEW COMPARISON:  None. FINDINGS: Small knee joint effusion. No fracture or dislocation. No evidence of lipohemarthrosis. 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 No evidence of chondrocalcinosis. Scattered adjacent vascular calcifications. No radiopaque foreign body. IMPRESSION: 1. Small knee joint effusion without associated fracture or lipohemarthrosis. 2. 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 Electronically Signed   By: Sandi Mariscal M.D.   On: 07/21/2021 16:56   NM Myocar Multi W/Spect W/Wall Motion / EF  Result Date: 07/22/2021   Findings are equivocal.   There is infiltration of the IV with extravasation of the myoview into the patients arm during the stress images.   The study in uninterpretable .   ECHOCARDIOGRAM COMPLETE  Result Date: 07/22/2021    ECHOCARDIOGRAM REPORT   Patient Name:   Tony Young [facility] Date of Exam: 07/22/2021 Medical Rec #:  [id number]              Height:       67.0 in Accession #:    [id number]             Weight:       248.0 lb Date of Birth:  [birth year]              BSA:          2.216 m Patient Age:    66 years               BP:           116/60 mmHg Patient Gender: M                       HR:           112 bpm. Exam Location:  Inpatient Procedure: 2D Echo, Cardiac Doppler and Color Doppler Indications:    I50.21 CHF  History:        Patient has no prior history of Echocardiogram examinations.                 Cardiomyopathy, CAD, COPD; Risk Factors:Diabetes.  Sonographer:    Beryle Beams Referring Phys: 44 RHONDA G BARRETT  Sonographer Comments: Patient is morbidly obese. IMPRESSIONS  1. Left ventricular ejection fraction, by estimation, is 40 to 45%. The left ventricle has mildly decreased function. Left ventricular endocardial border not optimally defined to evaluate regional wall motion. There is mild left ventricular hypertrophy.  Left ventricular diastolic  parameters are indeterminate.  2. Right ventricular systolic function is normal. The right ventricular size is normal.  3. The mitral valve is normal in structure. No evidence of mitral valve regurgitation. No evidence of mitral stenosis.  4. The aortic valve is calcified. There is mild calcification of the aortic valve. There is mild thickening of the aortic valve. Aortic valve regurgitation is not visualized. Aortic valve sclerosis is present, with no evidence of aortic valve stenosis.  5. The inferior vena cava is normal in size with greater than 50% respiratory variability, suggesting right atrial pressure of 3 mmHg. FINDINGS  Left Ventricle: Left ventricular ejection fraction, by estimation, is 40 to 45%. The left ventricle has mildly decreased function. Left ventricular endocardial border not optimally defined to evaluate regional wall motion. The left ventricular internal cavity size was normal in size. There is mild left ventricular hypertrophy. Left ventricular diastolic parameters are indeterminate. Right Ventricle: The right ventricular size is normal. No increase in right ventricular wall thickness. Right ventricular systolic function is normal. Left Atrium: Left atrial size was normal in size. Right Atrium: Right atrial size  was normal in size. Pericardium: There is no evidence of pericardial effusion. Mitral Valve: The mitral valve is normal in structure. No evidence of mitral valve regurgitation. No evidence of mitral valve stenosis. Tricuspid Valve: The tricuspid valve is normal in structure. Tricuspid valve regurgitation is not demonstrated. No evidence of tricuspid stenosis. Aortic Valve: The aortic valve is calcified. There is mild calcification of the aortic valve. There is mild thickening of the aortic valve. Aortic valve regurgitation is not visualized. Aortic valve sclerosis is present, with no evidence of aortic valve stenosis. Aortic valve mean gradient measures 3.0 mmHg. Aortic valve peak gradient measures 5.3 mmHg. Aortic valve area, by VTI measures 1.96 cm. Pulmonic Valve: The pulmonic valve was normal in structure. Pulmonic valve regurgitation is not visualized. No evidence of pulmonic stenosis. Aorta: The aortic root is normal in size and structure. Venous: The inferior vena cava is normal in size with greater than 50% respiratory variability, suggesting right atrial pressure of 3 mmHg. IAS/Shunts: No atrial level shunt detected by color flow Doppler.  LEFT VENTRICLE PLAX 2D LVIDd:         5.00 cm LVIDs:         4.30 cm LV PW:         1.40 cm LV IVS:        1.00 cm LVOT diam:     2.20 cm LV SV:         41 LV SV Index:   18 LVOT Area:     3.80 cm  LV Volumes (MOD) LV vol d, MOD A2C: 88.5 ml LV vol d, MOD A4C: 102.0 ml LV vol s, MOD A2C: 43.1 ml LV vol s, MOD A4C: 56.4 ml LV SV MOD A2C:     45.4 ml LV SV MOD A4C:     102.0 ml LV SV MOD BP:      45.9 ml RIGHT VENTRICLE             IVC RV S prime:     10.30 cm/s  IVC diam: 2.00 cm TAPSE (M-mode): 1.8 cm LEFT ATRIUM             Index        RIGHT ATRIUM           Index LA diam:        4.00 cm 1.81 cm/m   RA  Area:     17.20 cm LA Vol (A2C):   48.0 ml 21.66 ml/m  RA Volume:   51.90 ml  23.42 ml/m LA Vol (A4C):   62.9 ml 28.39 ml/m LA Biplane Vol: 55.9 ml 25.23 ml/m   AORTIC VALVE                    PULMONIC VALVE AV Area (Vmax):    2.61 cm     PV Vmax:       0.70 m/s AV Area (Vmean):   2.36 cm     PV Vmean:      47.000 cm/s AV Area (VTI):     1.96 cm     PV VTI:        0.131 m AV Vmax:           115.00 cm/s  PV Peak grad:  1.9 mmHg AV Vmean:          82.900 cm/s  PV Mean grad:  1.0 mmHg AV VTI:            0.208 m AV Peak Grad:      5.3 mmHg AV Mean Grad:      3.0 mmHg LVOT Vmax:         79.10 cm/s LVOT Vmean:        51.400 cm/s LVOT VTI:          0.107 m LVOT/AV VTI ratio: 0.52  AORTA Ao Root diam: 2.60 cm Ao Asc diam:  2.10 cm  SHUNTS Systemic VTI:  0.11 m Systemic Diam: 2.20 cm Candee Furbish MD Electronically signed by Candee Furbish MD Signature Date/Time: 07/22/2021/11:05:23 AM    Final    Recent Labs    07/20/21 0555  WBC 3.5*  HGB 8.9*  HCT 27.7*  PLT 111*   Recent Labs    07/20/21 0555  NA 132*  K 3.9  CL 96*  CO2 25  GLUCOSE 336*  BUN 19  CREATININE 1.16  CALCIUM 8.3*    Intake/Output Summary (Last 24 hours) at 07/22/2021 1848 Last data filed at 07/22/2021 1830 Gross per 24 hour  Intake --  Output 975 ml  Net -975 ml     Pressure Injury 07/15/21 Buttocks Left Unstageable - Full thickness tissue loss in which the base of the injury is covered by slough (yellow, tan, gray, green or brown) and/or eschar (tan, brown or black) in the wound bed. (Active)  07/15/21 1520  Location: Buttocks  Location Orientation: Left  Staging: Unstageable - Full thickness tissue loss in which the base of the injury is covered by slough (yellow, tan, gray, green or brown) and/or eschar (tan, brown or black) in the wound bed.  Wound Description (Comments):   Present on Admission: Yes    Physical Exam: Vital Signs Blood pressure 117/61, pulse 97, temperature 98.8 F (37.1 C), resp. rate 16, height 5\' 7"  (1.702 m), weight 112.5 kg, SpO2 99 %.        General: awake, alert, appropriate, c/o L knee pain; supine; NAD HENT: conjugate gaze; oropharynx moist-  PMV on trach CV: regular rate in 90s; no JVD Pulmonary: CTA B/L; no W/R/R- good air movement- sounds great GI: soft, NT, ND, (+)BS Psychiatric: appropriate Neurological: Ox3  Musculoskeletal:   R shoulder (+) impingement vs partial RTC tear on R shoulder- Also TTP posteriorly.       General: Swelling present.     Comments: 2-3+ edema LLE. Dependent edema bilateral forearms R>L.  Skin:    General: Skin is warm and dry.     Comments: Multiple scabs on bilateral hands. LLE with bulking dressing and dried serous drainage-  .- a lot of ecchymoses in arms B/L    LLE- looks less beefy and deeper red with clots that appear to be ON top of the tissue from bleeding as compared to in the tissue- from dressings? More slough as well Neurological:     Mental Status: He is alert and oriented to person, place, and time.     Comments: Speech mildly dysarthric without dysphonia. Able to follow simple motor commands without difficulty. RUE weakness noted. Tends to keep LLE rotated outwards    07/20/21  Assessment/Plan: 1. Functional deficits which require 3+ hours per day of interdisciplinary therapy in a comprehensive inpatient rehab setting. Physiatrist is providing close team supervision and 24 hour management of active medical problems listed below. Physiatrist and rehab team continue to assess barriers to discharge/monitor patient progress toward functional and medical goals  Care Tool:  Bathing    Body parts bathed by patient: Face   Body parts bathed by helper: Right arm, Left arm, Chest, Abdomen, Front perineal area, Buttocks, Right upper leg, Left upper leg, Right lower leg, Left lower leg, Face Body parts n/a: Left lower leg   Bathing assist Assist Level: 2 Helpers     Upper Body Dressing/Undressing Upper body dressing   What is the patient wearing?: Hospital gown only    Upper body assist Assist Level: Total Assistance - Patient < 25%    Lower Body Dressing/Undressing Lower body  dressing      What is the patient wearing?: Pants, Underwear/pull up     Lower body assist Assist for lower body dressing: 2 Helpers     Toileting Toileting    Toileting assist Assist for toileting: 2 Helpers     Transfers Chair/bed transfer  Transfers assist  Chair/bed transfer activity did not occur: Safety/medical concerns        Locomotion Ambulation   Ambulation assist   Ambulation activity did not occur: Safety/medical concerns          Walk 10 feet activity   Assist  Walk 10 feet activity did not occur: Safety/medical concerns        Walk 50 feet activity   Assist Walk 50 feet with 2 turns activity did not occur: Safety/medical concerns         Walk 150 feet activity   Assist Walk 150 feet activity did not occur: Safety/medical concerns         Walk 10 feet on uneven surface  activity   Assist Walk 10 feet on uneven surfaces activity did not occur: Safety/medical concerns         Wheelchair     Assist Is the patient using a wheelchair?: Yes Type of Wheelchair: Manual Wheelchair activity did not occur: Safety/medical concerns         Wheelchair 50 feet with 2 turns activity    Assist    Wheelchair 50 feet with 2 turns activity did not occur: Safety/medical concerns       Wheelchair 150 feet activity     Assist  Wheelchair 150 feet activity did not occur: Safety/medical concerns       Blood pressure 117/61, pulse 97, temperature 98.8 F (37.1 C), resp. rate 16, height 5\' 7"  (1.702 m), weight 112.5 kg, SpO2 99 %.  Medical Problem List and Plan: 1. 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  2.  Antithrombotics: -DVT/anticoagulation:  Mechanical: Sequential compression devices, below  knee Right lower extremity --high risk for DVTs due to body habitus and immobility. Will check dopplers in am. 2/17- pt has extensive DVTs in RLE and some in LLE- as above- placed on tx dose lovenox- will check CBC tomorrow due to risk of new GI bleed   2/20- Plts low 100s, however Hb stable- con't regimen- cannot place on DOAC_ will need Warfarin when change over per pharmacy 2/22- wait to change due to surgical intervention planned.              -antiplatelet therapy: N/A 3. Pain: continue Oxycodone prn. 2/16- main pain is R shoulder- con't regimen prn 2/22- L knee looks like swelling/effusion- however likely a strain- con't oxy prnand added tramadol TID  4. Mood: LCSW to follow for evaluation and support.              -antipsychotic agents:  Risperdal bid 5. Neuropsych: This patient is capable of making decisions on his own behalf. 6. Skin/Wound Care: Air mattress due to sacral decub and necrotizing fasciitis LLE-              --continue vitamins and supplements to promote wound healing.  7. Fluids/Electrolytes/Nutrition:  Strict I/O as continues to show signs of overload/anasarca.             --low salt diet. Continue Juven to help with protein stores. Eating 50-100% meals will be able to reduce TF which should help hyperglycemia also drinking 2 cans ensure per day  2/22- will d/w nutrition if needs Tfs anymore- eating meals?- calorie count being finished today.  8. Strep pyogenes bacteremia/Necrotizing fascitis LLE s/p I & D: Continue daily dressing changes.              --maintain adequate nutritional status to help promote healing. 9. Acute  reps failure s/p trach- and COPD: Respiratory status stable. Continue steriod taper  2/16- con't PMV and SLP for swallowing/speech/phonation  2/20- talking with Resp therapy if can downsize trach- I think we likely can to #4, however will need to discuss directly with Resp therapy.  10 OSA/OHS?/VDRF: Continue button plugging during the day and ATC  at nights for humidification             --Started discussion that he will likely go home with trach.  11. H/o GIB: H/H improving. Continue PPI BID.              --no signs of bleeding. Will start low dose Lovenox if platelets stable.   Monitor CBC on anticoagulation  CBC Latest Ref Rng & Units 07/20/2021 07/18/2021 07/16/2021  WBC 4.0 - 10.5 K/uL 3.5(L) 3.4(L) 2.9(L)  Hemoglobin 13.0 - 17.0 g/dL 8.9(L) 9.1(L) 9.0(L)  Hematocrit 39.0 - 52.0 % 27.7(L) 29.0(L) 28.6(L)  Platelets 150 - 400 K/uL 111(L) PLATELET CLUMPS NOTED ON SMEAR, UNABLE TO ESTIMATE 166    2/20- Hb stable- con't to monitor 2x/week 12. Thrombocytopenia: Platelets continue to fluctuate.  --Will recheck CBC in am to decide on initiation of [facility]  2/16- Plts up to 166k  2/20- Plts down to 111k- will recheck Thursday 13.  T2DM:  Check Hgb A1c in am. Will monitor BS ac/hs with SSI for elevated BS             Add 2U Semglee HS             --continue tube feeds at nights (likely contributing to diarrhea)     2/17- Pt CBGs are running 200sto300s- will increase Semglee to 6 units nightly- from 2 units and monitor for trend CBG (last 3)  Recent Labs    07/21/21 2043 07/22/21 0610 07/22/21 1542  GLUCAP 289* 159* 161*  Increase semglee to 20U change SSI to Moderate   2/20- just got increased yesterday- wait to change meds for 24 hours- in midst of calorie count to see if can stop TF's.   2/21- will increase semglee to 24 units and maintain SSI- since CBGs are in 200s-300s-   2/22- pt NPO earlier today so CBGs looked good- however back up to 289 this evening- if still up in AM, will increase Semglee 14. R RTC injury- probably partial tear-   2/16- sustained in [facility]- will write note above HOB to remind staff to protect R shoulder.  15. Extensive B/L DVTs-   2/17- will check CBC this weekend, since recent hx of GI bleed. Don't want pt having PE due to trach and resp failure requiring trach.  16. Necrotizing fasciitis of LLE  2/21-  looks less beefy and deeper red- concerning to me with more slough- called Gen surg, then ortho- they will look at pt, however might need Plastics--   2/22- plastics says pt ready for skin grafts- could do 3/1- but I am concerned pt will need to leave rehab to get surgical intervention- still d/w plastics  I spent a total of 41   minutes on total care today- >50% coordination of care- due to d/w plastics about surgical skin graft; and nursing about meds with sips and medical issues overall.       LOS: 7 days A FACE TO FACE EVALUATION WAS PERFORMED  Kendric Sindelar 07/22/2021, 6:48 PM

## 2021-07-22 NOTE — Progress Notes (Signed)
Occupational Therapy Session Note  Patient Details  Name: Tony Young MRN: [id number] Date of Birth: July 07, 1955  Today's Date: 07/22/2021 OT Individual Time: 3361-2244 OT Individual Time Calculation (min): 17 min  43 mins missed for IV placement    Short Term Goals: Week 1:  OT Short Term Goal 1 (Week 1): Patient will compelte UB bathing with Max A in supine. OT Short Term Goal 2 (Week 1): Patient will complete UB dressing wtih Max A in supine. OT Short Term Goal 3 (Week 1): Patient will maintain static sitting balance with Mod A seated EOB in prep for ADLs. OT Short Term Goal 4 (Week 1): Patient will complete sit to stand transfer with Max A +2 and LRAD.  Skilled Therapeutic Interventions/Progress Updates:  Pt greeted supine in bed agreeable to OT intervention. Session focus on BADL reeducation. RN reports waiting on IV team therefore session conducted from bed level. Pt requested to work on grooming with pt needed total A for cleanliness to wash face from bed level, pt with limited AROM in BUEs and decreased grasp to hold wash cloth, applied lotion to pts face d/t dry skin. IV team then entered for IV placement, pt left supine in bed with all needs within reach.                Therapy Documentation Precautions:  Precautions Precautions: Fall Precaution Comments: trach, PEG, unable to read, fragile skin Restrictions Weight Bearing Restrictions: No   Pain: no c/o pain during session     Therapy/Group: Individual Therapy  Corinne Ports [facility] 07/22/2021, 11:51 AM

## 2021-07-22 NOTE — Progress Notes (Signed)
Pt slept good through the night. NPO since midnight. Pt's dressing to LLE saturated this AM.

## 2021-07-22 NOTE — Progress Notes (Signed)
Speech Language Pathology Weekly Progress and Session Note  Patient Details  Name: Tony Young MRN: [id number] Date of Birth: 12/11/1955  Beginning of progress report period: July 16, 2021 End of progress report period: July 22, 2021  Today's Date: 07/22/2021 SLP Missed Time: 49 Minutes Missed Time Reason: Unavailable (Comment) (out unit for procedure)  Short Term Goals: Week 1: SLP Short Term Goal 1 (Week 1): Patient will tolerate current diet with min A verbal cues for implementing safe swallowing precautions and strategies (e.g., no straws, single sips, small bites, slow rate). SLP Short Term Goal 1 - Progress (Week 1): Met SLP Short Term Goal 2 (Week 1): Patient will consume dys 3 trials with effective mastication, minimal oral residuals, and without overt s/sx of aspiration with min A verbal cues to implement safe swallow precautions and strategies prior to diet advancement SLP Short Term Goal 2 - Progress (Week 1): Not met SLP Short Term Goal 3 (Week 1): Patient will participate in further cognitive-linguistic assessment SLP Short Term Goal 3 - Progress (Week 1): Met SLP Short Term Goal 4 (Week 1): Patient will utilize external/internal memory aids to recall important information with minA. SLP Short Term Goal 4 - Progress (Week 1): Not met    New Short Term Goals: Week 2: SLP Short Term Goal 1 (Week 2): Pt will consumed thin liquids (cup or straw) with minimal overt s/s aspiration and supervision a verbal cues for use of small sips. SLP Short Term Goal 2 (Week 2): Patient will consume dys 3 trials with effective mastication, minimal oral residuals, and without overt s/sx of aspiration with min A verbal cues to implement safe swallow precautions and strategies prior to diet advancement SLP Short Term Goal 3 (Week 2): Patient will utilize external/internal memory aids to recall important information with minA.  Weekly Progress Updates:Pt made moderate progress  meeting 2 out 4 goals. Pt is tolerating PMSV during all waking hours with vitals signs WFL. Respiratory mentioned to MD downsizing Shiley trach from #6 to #4, but this has not been completed during this reporting period. Pt is 100% intelligible in conversation. SLP is addressing dysphagia and cognition goals. SLP reduced liquid restrictions from cup to allow straws, after reviewing most recent MBS report (07/09/21) and images, no penetration nor aspiration was noted with thin liquids only mild pharyngeal residue on all assessed substances. Pt is consuming and tolerating thin liquids and dys 2 textures with full supervision A for use of small sips via straw, intermittent dry swallow and total A for feeding. Pt and pt's family support changes in short term recall and occasional orientation, SLP is addressing these goals only at this time, but further assessment may be warranted as patient gains UE mobility. Pt scored 18/27 on SLUMS (adjusted due to education level.) Pt has limited reading/witting abilities and family completed all IADLs. Pt's barriers are trach/PMSV, dysphagia and short term recall. Pt would continue to benefit from skilled ST services in order to maximize functional independence and reduce burden of care requiring 24 hour supervision A and continued ST services.      Intensity: Minumum of 1-2 x/day, 30 to 90 minutes Frequency: 3 to 5 out of 7 days Duration/Length of Stay: 4 weeks Treatment/Interventions: Functional tasks;Patient/family education;Dysphagia/aspiration precaution training;Therapeutic Activities;Therapeutic Exercise   Daily Session  Skilled Therapeutic Interventions: Pt was off unit for procedure. Pt missed 60 minutes of skilled ST services.   General    Pain    Therapy/Group: Individual Therapy  Tony Young  07/22/2021, 2:48 PM

## 2021-07-23 ENCOUNTER — Other Ambulatory Visit (HOSPITAL_COMMUNITY): Payer: Self-pay

## 2021-07-23 DIAGNOSIS — G7281 Critical illness myopathy: Secondary | ICD-10-CM | POA: Diagnosis not present

## 2021-07-23 LAB — GLUCOSE, CAPILLARY
Glucose-Capillary: 132 mg/dL — ABNORMAL HIGH (ref 70–99)
Glucose-Capillary: 142 mg/dL — ABNORMAL HIGH (ref 70–99)
Glucose-Capillary: 107 mg/dL — ABNORMAL HIGH (ref 70–99)
Glucose-Capillary: 245 mg/dL — ABNORMAL HIGH (ref 70–99)

## 2021-07-23 MED ORDER — IVABRADINE HCL 7.5 MG PO TABS
15.0000 mg | ORAL_TABLET | Freq: Once | ORAL | Status: AC
  Administered 2021-07-23: 15 mg via ORAL
  Filled 2021-07-23: qty 2

## 2021-07-23 NOTE — Progress Notes (Signed)
Speech Language Pathology Daily Session Note  Patient Details  Name: Tony Young MRN: [id number] Date of Birth: [birth year]  Today's Date: 07/23/2021 SLP Individual Time: 0900-0915 SLP Individual Time Calculation (min): 15 min and Today's Date: 07/23/2021 SLP Missed Time: 80 Minutes Missed Time Reason: Patient unwilling to participate  Short Term Goals: Week 2: SLP Short Term Goal 1 (Week 2): Pt will consumed thin liquids (cup or straw) with minimal overt s/s aspiration and supervision a verbal cues for use of small sips. SLP Short Term Goal 2 (Week 2): Patient will consume dys 3 trials with effective mastication, minimal oral residuals, and without overt s/sx of aspiration with min A verbal cues to implement safe swallow precautions and strategies prior to diet advancement SLP Short Term Goal 3 (Week 2): Patient will utilize external/internal memory aids to recall important information with minA.  Skilled Therapeutic Interventions: Skilled ST treatment focused on cognitive goals. Upon arrival patient stated he was "not doing well" and upset that he needs to be NPO at this time as he awaits "a bunch of procedures" but unable to recall the purpose. PA arrived and discussed stress test and provided explanation for NPO status. After PA left, patient asked SLP to summarize what was discussed. At this time patient expressed "I give out" and declined consideration of therapy until he is able to eat/drink and feels as though we are "starving" him. SLP reinforced clinical reasoning and provided encouragement. Patient missed 45 minutes of skilled ST intervention due to pt being unwilling to participate. Patient was left in bed with alarm activated and immediate needs within reach at end of session.    Pain  None/denied  Therapy/Group: Individual Therapy  Patty Sermons 07/23/2021, 9:13 AM

## 2021-07-23 NOTE — Progress Notes (Signed)
Pt has been changed to NPO due to procedure in AM.   Tony Young

## 2021-07-23 NOTE — Progress Notes (Signed)
Occupational Therapy Session Note  Patient Details  Name: Tony Young MRN: [id number] Date of Birth: 03-Oct-1955  Today's Date: 07/23/2021 OT Individual Time: 0730-0825 OT Individual Time Calculation (min): 55 min    Short Term Goals: Week 1:  OT Short Term Goal 1 (Week 1): Patient will compelte UB bathing with Max A in supine. OT Short Term Goal 2 (Week 1): Patient will complete UB dressing wtih Max A in supine. OT Short Term Goal 3 (Week 1): Patient will maintain static sitting balance with Mod A seated EOB in prep for ADLs. OT Short Term Goal 4 (Week 1): Patient will complete sit to stand transfer with Max A +2 and LRAD.   Skilled Therapeutic Interventions/Progress Updates:    Pt greeted at time of session semireclined bed level resting with NT present, pt having questions about procedure today planned and why he has NPO status, attempted to locate nurse but unable. Returned to room to relay this to pt and MD entered as well for morning rounds, pt is to have another procedure today and remain NPO at this time and pt aware. MD discussing skin graft in the future, OT continuing this conversation with pt as he had questions regarding procedure. Rehab tech present for 2+ assist as well. Discussed with pt that he will need to stay in bed at end of session as pt has hydro this AM and no further PT/OT this AM. Discussion with pt regarding bathing/ADL session this AM and initially agreeable. When presented with wash cloth and instructed to wash face, but became VERY irritated and cursing stating he would "poke his eye out of he tried."  With MAX encouragement and time to calm down, pt agreeable to wash torso and trunk, adamantly declined sitting up at EOB to do so claiming it would "kill his knee." Pt resistant to all techniques and education. Total A to wash face at this time and Mod/Max A to wash stomach area, declined all other ADL. Attempted to discuss DC planning with the pt if he would be  open to SNF if family was unable to provide 24/7 physical care and pt becoming very angry, needing extended time to calm down and explanation of short term SNF stays to increase independence. Pt frequently saying "I cant I cant" throughout session and refusing to attempt physical movement but also stating "Ill try anything" but refusing to do tasks. Ended session with 2x10 sponge squeezes in each hand for grip strengthening and decreasing tremors. Left bed level resting with soft call in reach all needs met.     Therapy Documentation Precautions:  Precautions Precautions: Fall Precaution Comments: trach, PEG, unable to read, fragile skin Restrictions Weight Bearing Restrictions: No     Therapy/Group: Individual Therapy  Viona Gilmore 07/23/2021, 7:17 AM

## 2021-07-23 NOTE — Progress Notes (Signed)
PROGRESS NOTE   Subjective/Complaints:  Pt reports no real change with L knee.  Has to go back for procedure today- was NPO, but found out can make NPO at 11am, so will do so and give meds and food/drink.   Asking for something for pain and is thirsty  ROS:  Pt denies SOB, abd pain, CP, N/V/C/D, and vision changes      Objective:   DG Knee 1-2 Views Left  Result Date: 07/21/2021 CLINICAL DATA:  Left knee pain after attempting to walk in rehab. History of fall. EXAM: LEFT KNEE - 1-2 VIEW COMPARISON:  None. FINDINGS: Small knee joint effusion. No fracture or dislocation. No evidence of lipohemarthrosis. 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 No evidence of chondrocalcinosis. Scattered adjacent vascular calcifications. No radiopaque foreign body. IMPRESSION: 1. Small knee joint effusion without associated fracture or lipohemarthrosis. 2. 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 Electronically Signed   By: Sandi Mariscal M.D.   On: 07/21/2021 16:56   NM Myocar Multi W/Spect W/Wall Motion / EF  Result Date: 07/22/2021   Findings are equivocal.   There is infiltration of the IV with extravasation of the myoview into the patients arm during the stress images.   The study in uninterpretable .   ECHOCARDIOGRAM COMPLETE  Result Date: 07/22/2021    ECHOCARDIOGRAM REPORT   Patient Name:   Tony Young [facility] Date of Exam: 07/22/2021 Medical Rec #:  [id number]              Height:       67.0 in Accession #:    [id number]             Weight:       248.0 lb Date of Birth:  1955/09/29              BSA:          2.216 m Patient Age:    66 years               BP:           116/60 mmHg Patient Gender: M                      HR:           112 bpm. Exam Location:   Inpatient Procedure: 2D Echo, Cardiac Doppler and Color Doppler Indications:    I50.21 CHF  History:        Patient has no prior history of Echocardiogram examinations.                 Cardiomyopathy, CAD, COPD; Risk Factors:Diabetes.  Sonographer:    Beryle Beams Referring Phys: 23 RHONDA G BARRETT  Sonographer Comments: Patient is morbidly obese. IMPRESSIONS  1. Left ventricular ejection fraction, by estimation, is 40 to 45%. The left ventricle has mildly decreased function. Left ventricular endocardial border not optimally defined to evaluate regional wall motion. There is mild left ventricular hypertrophy.  Left ventricular diastolic parameters are indeterminate.  2. Right ventricular systolic  function is normal. The right ventricular size is normal.  3. The mitral valve is normal in structure. No evidence of mitral valve regurgitation. No evidence of mitral stenosis.  4. The aortic valve is calcified. There is mild calcification of the aortic valve. There is mild thickening of the aortic valve. Aortic valve regurgitation is not visualized. Aortic valve sclerosis is present, with no evidence of aortic valve stenosis.  5. The inferior vena cava is normal in size with greater than 50% respiratory variability, suggesting right atrial pressure of 3 mmHg. FINDINGS  Left Ventricle: Left ventricular ejection fraction, by estimation, is 40 to 45%. The left ventricle has mildly decreased function. Left ventricular endocardial border not optimally defined to evaluate regional wall motion. The left ventricular internal cavity size was normal in size. There is mild left ventricular hypertrophy. Left ventricular diastolic parameters are indeterminate. Right Ventricle: The right ventricular size is normal. No increase in right ventricular wall thickness. Right ventricular systolic function is normal. Left Atrium: Left atrial size was normal in size. Right Atrium: Right atrial size was normal in size. Pericardium: There is  no evidence of pericardial effusion. Mitral Valve: The mitral valve is normal in structure. No evidence of mitral valve regurgitation. No evidence of mitral valve stenosis. Tricuspid Valve: The tricuspid valve is normal in structure. Tricuspid valve regurgitation is not demonstrated. No evidence of tricuspid stenosis. Aortic Valve: The aortic valve is calcified. There is mild calcification of the aortic valve. There is mild thickening of the aortic valve. Aortic valve regurgitation is not visualized. Aortic valve sclerosis is present, with no evidence of aortic valve stenosis. Aortic valve mean gradient measures 3.0 mmHg. Aortic valve peak gradient measures 5.3 mmHg. Aortic valve area, by VTI measures 1.96 cm. Pulmonic Valve: The pulmonic valve was normal in structure. Pulmonic valve regurgitation is not visualized. No evidence of pulmonic stenosis. Aorta: The aortic root is normal in size and structure. Venous: The inferior vena cava is normal in size with greater than 50% respiratory variability, suggesting right atrial pressure of 3 mmHg. IAS/Shunts: No atrial level shunt detected by color flow Doppler.  LEFT VENTRICLE PLAX 2D LVIDd:         5.00 cm LVIDs:         4.30 cm LV PW:         1.40 cm LV IVS:        1.00 cm LVOT diam:     2.20 cm LV SV:         41 LV SV Index:   18 LVOT Area:     3.80 cm  LV Volumes (MOD) LV vol d, MOD A2C: 88.5 ml LV vol d, MOD A4C: 102.0 ml LV vol s, MOD A2C: 43.1 ml LV vol s, MOD A4C: 56.4 ml LV SV MOD A2C:     45.4 ml LV SV MOD A4C:     102.0 ml LV SV MOD BP:      45.9 ml RIGHT VENTRICLE             IVC RV S prime:     10.30 cm/s  IVC diam: 2.00 cm TAPSE (M-mode): 1.8 cm LEFT ATRIUM             Index        RIGHT ATRIUM           Index LA diam:        4.00 cm 1.81 cm/m   RA Area:     17.20 cm LA  Vol [facility]):   48.0 ml 21.66 ml/m  RA Volume:   51.90 ml  23.42 ml/m LA Vol (A4C):   62.9 ml 28.39 ml/m LA Biplane Vol: 55.9 ml 25.23 ml/m  AORTIC VALVE                    PULMONIC  VALVE AV Area (Vmax):    2.61 cm     PV Vmax:       0.70 m/s AV Area (Vmean):   2.36 cm     PV Vmean:      47.000 cm/s AV Area (VTI):     1.96 cm     PV VTI:        0.131 m AV Vmax:           115.00 cm/s  PV Peak grad:  1.9 mmHg AV Vmean:          82.900 cm/s  PV Mean grad:  1.0 mmHg AV VTI:            0.208 m AV Peak Grad:      5.3 mmHg AV Mean Grad:      3.0 mmHg LVOT Vmax:         79.10 cm/s LVOT Vmean:        51.400 cm/s LVOT VTI:          0.107 m LVOT/AV VTI ratio: 0.52  AORTA Ao Root diam: 2.60 cm Ao Asc diam:  2.10 cm  SHUNTS Systemic VTI:  0.11 m Systemic Diam: 2.20 cm Candee Furbish MD Electronically signed by Candee Furbish MD Signature Date/Time: 07/22/2021/11:05:23 AM    Final    No results for input(s): WBC, HGB, HCT, PLT in the last 72 hours.  No results for input(s): NA, K, CL, CO2, GLUCOSE, BUN, CREATININE, CALCIUM in the last 72 hours.   Intake/Output Summary (Last 24 hours) at 07/23/2021 1037 Last data filed at 07/23/2021 1032 Gross per 24 hour  Intake 598 ml  Output 850 ml  Net -252 ml     Pressure Injury 07/15/21 Buttocks Left Unstageable - Full thickness tissue loss in which the base of the injury is covered by slough (yellow, tan, gray, green or brown) and/or eschar (tan, brown or black) in the wound bed. (Active)  07/15/21 1520  Location: Buttocks  Location Orientation: Left  Staging: Unstageable - Full thickness tissue loss in which the base of the injury is covered by slough (yellow, tan, gray, green or brown) and/or eschar (tan, brown or black) in the wound bed.  Wound Description (Comments):   Present on Admission: Yes    Physical Exam: Vital Signs Blood pressure 101/64, pulse 88, temperature 98.1 F (36.7 C), resp. rate 18, height 5\' 7"  (1.702 m), weight 112.9 kg, SpO2 97 %.         General: awake, alert, appropriate, laying supine in bed; joking some; elbow pad sin place NAD HENT: conjugate gaze; oropharynx moist- trach with PMV CV: regular rate; no  JVD Pulmonary: CTA B/L; no W/R/R- decreased at bases GI: soft, NT, ND, (+)BS Psychiatric: appropriate- joking, but irritated over NPO Neurological: Ox3  Musculoskeletal:   R shoulder (+) impingement vs partial RTC tear on R shoulder- Also TTP posteriorly.       General: Swelling present.     Comments: 2-3+ edema LLE. Dependent edema bilateral forearms R>L.   Skin:    General: Skin is warm and dry.     Comments: Multiple scabs on bilateral hands. LLE with  bulking dressing and dried serous drainage-  .- a lot of ecchymoses in arms B/L    LLE- looks less beefy and deeper red with clots that appear to be ON top of the tissue from bleeding as compared to in the tissue- from dressings? More slough as well Neurological:     Mental Status: He is alert and oriented to person, place, and time.     Comments: Speech mildly dysarthric without dysphonia. Able to follow simple motor commands without difficulty. RUE weakness noted. Tends to keep LLE rotated outwards    07/20/21  Assessment/Plan: 1. Functional deficits which require 3+ hours per day of interdisciplinary therapy in a comprehensive inpatient rehab setting. Physiatrist is providing close team supervision and 24 hour management of active medical problems listed below. Physiatrist and rehab team continue to assess barriers to discharge/monitor patient progress toward functional and medical goals  Care Tool:  Bathing    Body parts bathed by patient: Face   Body parts bathed by helper: Right arm, Left arm, Chest, Abdomen, Front perineal area, Buttocks, Right upper leg, Left upper leg, Right lower leg, Left lower leg, Face Body parts n/a: Left lower leg   Bathing assist Assist Level: 2 Helpers     Upper Body Dressing/Undressing Upper body dressing   What is the patient wearing?: Hospital gown only    Upper body assist Assist Level: Total Assistance - Patient < 25%    Lower Body Dressing/Undressing Lower body dressing      What  is the patient wearing?: Pants, Underwear/pull up     Lower body assist Assist for lower body dressing: 2 Helpers     Toileting Toileting    Toileting assist Assist for toileting: 2 Helpers     Transfers Chair/bed transfer  Transfers assist  Chair/bed transfer activity did not occur: Safety/medical concerns        Locomotion Ambulation   Ambulation assist   Ambulation activity did not occur: Safety/medical concerns          Walk 10 feet activity   Assist  Walk 10 feet activity did not occur: Safety/medical concerns        Walk 50 feet activity   Assist Walk 50 feet with 2 turns activity did not occur: Safety/medical concerns         Walk 150 feet activity   Assist Walk 150 feet activity did not occur: Safety/medical concerns         Walk 10 feet on uneven surface  activity   Assist Walk 10 feet on uneven surfaces activity did not occur: Safety/medical concerns         Wheelchair     Assist Is the patient using a wheelchair?: Yes Type of Wheelchair: Manual Wheelchair activity did not occur: Safety/medical concerns         Wheelchair 50 feet with 2 turns activity    Assist    Wheelchair 50 feet with 2 turns activity did not occur: Safety/medical concerns       Wheelchair 150 feet activity     Assist  Wheelchair 150 feet activity did not occur: Safety/medical concerns       Blood pressure 101/64, pulse 88, temperature 98.1 F (36.7 C), resp. rate 18, height 5\' 7"  (1.702 m), weight 112.9 kg, SpO2 97 %.  Medical Problem List and Plan: 1. 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  Antithrombotics: -DVT/anticoagulation:  Mechanical: Sequential compression devices, below knee Right lower extremity --high  risk for DVTs due to body habitus and immobility. Will check dopplers in am. 2/17- pt has extensive DVTs in RLE and some in LLE- as above- placed on tx dose lovenox- will check CBC tomorrow due to risk of new GI bleed   2/20- Plts low 100s, however Hb stable- con't regimen- cannot place on DOAC_ will need Warfarin when change over per pharmacy 2/22- wait to change due to surgical intervention planned.  2/23- d/w Plastic-s will cont lovenox until 1-2 days before surgery then change to heparin drip- will need to go back to acute for skin graft on 3/1             -antiplatelet therapy: N/A 3. Pain: continue Oxycodone prn. 2/16- main pain is R shoulder- con't regimen prn 2/22- L knee looks like swelling/effusion- however likely a strain- con't oxy prnand added tramadol TID  4. Mood: LCSW to follow for evaluation and support.              -antipsychotic agents:  Risperdal bid 5. Neuropsych: This patient is capable of making decisions on his own behalf. 6. Skin/Wound Care: Air mattress due to sacral decub and necrotizing fasciitis LLE-              --continue vitamins and supplements to promote wound healing.  7. Fluids/Electrolytes/Nutrition:  Strict I/O as continues to show signs of overload/anasarca.             --low salt diet. Continue Juven to help with protein stores. Eating 50-100% meals will be able to reduce TF which should help hyperglycemia also drinking 2 cans ensure per day  2/22- will d/w nutrition if needs Tfs anymore- eating meals?- calorie count being finished today.   2/23- will check with nursing about nutrition? 8. Strep pyogenes bacteremia/Necrotizing fascitis LLE s/p I & D: Continue daily dressing changes.              --maintain adequate nutritional status to help promote healing. 9. Acute  reps failure s/p trach- and COPD: Respiratory status stable. Continue steriod taper  2/16- con't PMV and SLP for swallowing/speech/phonation  2/20- talking with Resp therapy if can  downsize trach- I think we likely can to #4, however will need to discuss directly with Resp therapy.  10 OSA/OHS?/VDRF: Continue button plugging during the day and ATC at nights for humidification             --Started discussion that he will likely go home with trach.  11. H/o GIB: H/H improving. Continue PPI BID.              --no signs of bleeding. Will start low dose Lovenox if platelets stable.   Monitor CBC on anticoagulation  CBC Latest Ref Rng & Units 07/20/2021 07/18/2021 07/16/2021  WBC 4.0 - 10.5 K/uL 3.5(L) 3.4(L) 2.9(L)  Hemoglobin 13.0 - 17.0 g/dL 8.9(L) 9.1(L) 9.0(L)  Hematocrit 39.0 - 52.0 % 27.7(L) 29.0(L) 28.6(L)  Platelets 150 - 400 K/uL 111(L) PLATELET CLUMPS NOTED ON SMEAR, UNABLE TO ESTIMATE 166    2/20- Hb stable- con't to monitor 2x/week 12. Thrombocytopenia: Platelets continue to fluctuate.  --Will recheck CBC in am to decide on initiation of AC  2/16- Plts up to 166k  2/20- Plts down to 111k- will recheck Thursday 13.  T2DM:  Check Hgb A1c in am. Will monitor BS ac/hs with SSI for elevated BS             Add 2U Semglee HS             --continue tube feeds at nights (likely contributing to diarrhea)     2/17- Pt CBGs are running 200sto300s- will increase Semglee to 6 units nightly- from 2 units and monitor for trend CBG (last 3)  Recent Labs    07/22/21 1542 07/22/21 2059 07/23/21 0602  GLUCAP 161* 244* 132*  Increase semglee to 20U change SSI to Moderate   2/20- just got increased yesterday- wait to change meds for 24 hours- in midst of calorie count to see if can stop TF's.   2/21- will increase semglee to 24 units and maintain SSI- since CBGs are in 200s-300s-   2/22- pt NPO earlier today so CBGs looked good- however back up to 289 this evening- if still up in AM, will increase Semglee  2/23- wait until not NPO anymore 14. R RTC injury- probably partial tear-   2/16- sustained in [facility]- will write note above HOB to remind staff to protect R shoulder.  15.  Extensive B/L DVTs-   2/17- will check CBC this weekend, since recent hx of GI bleed. Don't want pt having PE due to trach and resp failure requiring trach.   2/23- Hb stable- however will con't lovenox until have ot switch to warfarin after surgery 16. Necrotizing fasciitis of LLE  2/21- looks less beefy and deeper red- concerning to me with more slough- called Gen surg, then ortho- they will look at pt, however might need Plastics--   2/22- plastics says pt ready for skin grafts- could do 3/1- but I am concerned pt will need to leave rehab to get surgical intervention- still d/w plastics  2/23- will arrange to go back to acute on 3/1 after surgery   I spent a total of 38    minutes on total care today- >50% coordination of care- due to d/w plastics about care and nursing about NPO status     LOS: 8 days A FACE TO FACE EVALUATION WAS PERFORMED  Arsalan Brisbin 07/23/2021, 10:37 AM

## 2021-07-23 NOTE — TOC Benefit Eligibility Note (Signed)
Patient Teacher, English as a foreign language completed.    The patient is currently admitted and upon discharge could be taking Jardiance 10 mg.  The current 30 day co-pay is, $47.00.   The patient is insured through [place] Patient Advocate Specialist [place] Patient Advocate Team Direct Number: [phone]  Fax: [phone]

## 2021-07-23 NOTE — Progress Notes (Signed)
Physical Therapy Session Note  Patient Details  Name: Tony Young MRN: [id number] Date of Birth: [birth year]  Today's Date: 07/23/2021 PT Individual Time: 7353-2992 PT Individual Time Calculation (min): 53 min   Short Term Goals: Week 1:  PT Short Term Goal 1 (Week 1): Pt will complete bed mobility with maxA of 1 person PT Short Term Goal 2 (Week 1): Pt will complete bed<>chair transfer with maxA +2 PT Short Term Goal 3 (Week 1): Pt will complete sit<>stand transfer with maxA +2 PT Short Term Goal 4 (Week 1): Pt will tolerate sitting in TIS w/c for >2 hrs outside of therapy  Skilled Therapeutic Interventions/Progress Updates:    Pt received supine in bed resting and talking on the phone with family. Pt initially not agreeable to therapy session stating "I'm so tired, I can't move" elaborating that therapy "just wore me out." With gentle max encouragement, pt agreeable to participate knowing that +2 assist would be arriving shortly to progress to EOB. Pt remained on continuous HR/SpO2 monitor - HR 107bpm and SpO2 100% on therapist arrival.  Performed supine B LE active assisted hip/knee flexion (heel slides but maintaining LE elevated off bed to avoid shearing forces on skin) x12 reps per LE - pt demos weakness in B LE hip flexors as well as decreased  BLE knee flexion ROM due to pain (L more painful than R).  Supine>L sidelying>sitting EOB via logroll technique with +2 max assist for B LE management and trunk upright - therapist providing step-by-step cuing for sequencing to increase pt participation, comfort with the task, and increased independence - heavier assist for bringing trunk upright.  Pt tolerated sitting EOB ~30 minutes during session focusing on trunk control with pt requiring varying from CGA to mod assist to maintain upright. Therapist left air mattress inflated due to L buttocks wound, which provided an unstable surface challenge to maintain balance. Pt tended to have L  lateral trunk lean/LOB.   Therapist gently placed ACE wrap around pt's L knee per pt's request for pain management due to joint pain. Sitting EOB, performed B LE long arc quads x15 reps each LE with cuing for increased ROM (pt lacks terminal knee extension bilaterally) and sustained hold at top.   Pt continues to have tremors in B UEs when attempting to reach for or grasp an item.   Sit>supine via reverse logroll technique with +2 max assist for trunk descent and B LE management onto bed. At end of session, pt left supine in bed with needs in reach (soft call button in L hand), HOB elevated, and bed alarm on.   Therapy Documentation Precautions:  Precautions Precautions: Fall Precaution Comments: trach, PEG, unable to read, fragile skin Restrictions Weight Bearing Restrictions: No   Pain:  Reports L knee pain with movement - ACE wrapped for pain management. Reports L buttocks wound pain after prolonged sitting on EOB.     Therapy/Group: Individual Therapy  Tawana Scale , PT, DPT, NCS, CSRS  07/23/2021, 3:05 PM

## 2021-07-23 NOTE — Progress Notes (Signed)
Physical Therapy Wound Treatment Patient Details  Name: Tony Young MRN: [id number] Date of Birth: 1956-05-05  Today's Date: 07/23/2021 PT Individual Time: 1125-1200 PT Individual Time Calculation (min): 35 min   Subjective  Subjective: Pt pleasant and agreeable to hydrotherapy Patient and Family Stated Goals: Heal wound Date of Onset:  (Unknown) Prior Treatments: Dressing changes  Pain Score:  Pt tolerated treatment well without complaints of pain 2 the wound.  Wound Assessment  Pressure Injury 07/15/21 Buttocks Left Unstageable - Full thickness tissue loss in which the base of the injury is covered by slough (yellow, tan, gray, green or brown) and/or eschar (tan, brown or black) in the wound bed. (Active)  Wound Image   07/23/21 1551  Dressing Type Foam - Lift dressing to assess site every shift;Gauze (Comment);Barrier Film (skin prep);Moist to moist;Santyl 07/23/21 1551  Dressing Clean, Dry, Intact;Changed 07/23/21 1551  Dressing Change Frequency Daily 07/23/21 1551  State of Healing Eschar 07/23/21 1551  Site / Wound Assessment Pink;Yellow;Red;Bleeding;Painful;Black 07/23/21 1551  % Wound base Red or Granulating 10% 07/23/21 1551  % Wound base Yellow/Fibrinous Exudate 80% 07/23/21 1551  % Wound base Black/Eschar 10% 07/23/21 1551  % Wound base Other/Granulation Tissue (Comment) 0% 07/23/21 1551  Peri-wound Assessment Intact 07/23/21 1551  Wound Length (cm) 7.2 cm 07/17/21 1518  Wound Width (cm) 7 cm 07/17/21 1518  Wound Depth (cm) 0.2 cm 07/17/21 1518  Wound Surface Area (cm^2) 50.4 cm^2 07/17/21 1518  Wound Volume (cm^3) 10.08 cm^3 07/17/21 1518  Tunneling (cm) 0 07/17/21 1518  Undermining (cm) 0 07/17/21 1518  Margins Unattached edges (unapproximated) 07/23/21 1551  Drainage Amount Minimal 07/23/21 1551  Drainage Description Serosanguineous 07/23/21 1551  Treatment Debridement (Selective);Hydrotherapy (Pulse lavage);Packing (Saline gauze) 07/23/21 1551       Hydrotherapy Pulsed lavage therapy - wound location: L buttock Pulsed Lavage with Suction (psi): 12 psi Pulsed Lavage with Suction - Normal Saline Used: 1000 mL Pulsed Lavage Tip: Tip with splash shield Selective Debridement Selective Debridement - Location: L buttock Selective Debridement - Tools Used: Forceps;Scalpel Selective Debridement - Tissue Removed: yellow eschar, slough   Wound Assessment and Plan  Wound Therapy - Assess/Plan/Recommendations Wound Therapy - Clinical Statement: Debridement limited as pt bleeding easily. Feel this pt would benefit from the Cleanse Choice Vera Flow Wound VAC to progress softening of unviable tissue to prepare for debridement. Discussed with MD who is agreeable. Will plan to see this patient tomorrow for regular hydrotherapy, with plans to apply the wound vac on Monday 2/27. Will continue to follow. Wound Therapy - Functional Problem List: Immobility Factors Delaying/Impairing Wound Healing: Diabetes Mellitus;Immobility;Multiple medical problems;Polypharmacy Hydrotherapy Plan: Debridement;Dressing change;Patient/family education;Pulsatile lavage with suction Wound Therapy - Frequency: 6X / week Wound Therapy - Follow Up Recommendations: dressing changes by RN  Wound Therapy Goals- Improve the function of patient's integumentary system by progressing the wound(s) through the phases of wound healing (inflammation - proliferation - remodeling) by: Decrease Necrotic Tissue to: 20% Decrease Necrotic Tissue - Progress: Progressing toward goal Increase Granulation Tissue to: 80% Increase Granulation Tissue - Progress: Progressing toward goal Goals/treatment plan/discharge plan were made with and agreed upon by patient/family: Yes Time For Goal Achievement: 7 days Wound Therapy - Potential for Goals: Good  Goals will be updated until maximal potential achieved or discharge criteria met.  Discharge criteria: when goals achieved, discharge from hospital,  MD decision/surgical intervention, no progress towards goals, refusal/missing three consecutive treatments without notification or medical reason.  GP     [name]  07/23/2021, 3:58 PM  Rolinda Roan, PT, DPT Acute Rehabilitation Services Pager: [phone] Office: [phone]

## 2021-07-23 NOTE — Progress Notes (Signed)
Pt stated he doesn't wear ATC at home for humidification, pt doesn't want to wear ATC. Pt is on RA, PMV in place at this time. No distress noted, Sp02 99% on RA

## 2021-07-23 NOTE — Progress Notes (Addendum)
Progress Note  Patient Name: Tony Young Date of Encounter: 07/23/2021  [facility] Cardiologist: Godfrey Pick Tobb, DO   Subjective   HR in 90-100s. No chest pain. Wants to drink fluids.   Inpatient Medications    Scheduled Meds:  amantadine  50 mg Oral Daily   amiodarone  200 mg Oral BID   vitamin C  500 mg Oral BID   busPIRone  7.5 mg Oral BID   cholecalciferol  1,000 Units Oral Daily   collagenase   Topical Daily   empagliflozin  10 mg Oral Daily   enoxaparin (LOVENOX) injection  120 mg Subcutaneous BID   feeding supplement  237 mL Oral BID BM   feeding supplement (OSMOLITE 1.5 CAL)  900 mL Per Tube Q24H   ferrous sulfate  300 mg Per Tube BID WC   free water  100 mL Per Tube Q6H   furosemide  20 mg Oral BID   insulin aspart  0-15 Units Subcutaneous TID WC   insulin aspart  0-5 Units Subcutaneous QHS   insulin glargine-yfgn  24 Units Subcutaneous QHS   latanoprost  1 drop Both Eyes QHS   levothyroxine  75 mcg Oral Q0600   loratadine  10 mg Oral Daily   melatonin  3 mg Oral QHS   metoprolol tartrate  50 mg Oral BID   multivitamin with minerals  1 tablet Oral Daily   nutrition supplement (JUVEN)  1 packet Per Tube BID BM   pantoprazole sodium  40 mg Per Tube BID   polycarbophil  625 mg Oral BID   predniSONE  10 mg Oral Q breakfast   pregabalin  50 mg Oral Daily   primidone  50 mg Oral Daily   risperiDONE  1 mg Oral BID   sertraline  100 mg Oral QHS   traMADol  50 mg Oral Q6H   zinc sulfate  220 mg Oral Daily   Continuous Infusions:  PRN Meds: acetaminophen, alum & mag hydroxide-simeth, bisacodyl, diphenhydrAMINE, guaiFENesin-dextromethorphan, ipratropium-albuterol, naphazoline-glycerin, oxyCODONE, polyethylene glycol, prochlorperazine **OR** prochlorperazine **OR** prochlorperazine, silver nitrate applicators, sodium phosphate, traZODone   Vital Signs    Vitals:   07/22/21 1539 07/22/21 1931 07/23/21 0401 07/23/21 0758  BP: 117/61 105/60 101/64    Pulse: 97 100 (!) 108 (!) 107  Resp: 16 20 17 18   Temp: 98.8 F (37.1 C) 98.5 F (36.9 C) 98.1 F (36.7 C)   TempSrc:      SpO2: 99% 98% 97% 97%  Weight:   112.9 kg   Height:        Intake/Output Summary (Last 24 hours) at 07/23/2021 0921 Last data filed at 07/23/2021 9485 Gross per 24 hour  Intake 480 ml  Output 650 ml  Net -170 ml   Last 3 Weights 07/23/2021 07/22/2021 07/21/2021  Weight (lbs) 249 lb 248 lb 256 lb 2.8 oz  Weight (kg) 112.946 kg 112.492 kg 116.2 kg      Telemetry    Not on tele   ECG    N/A  Physical Exam   GEN: Ill appearing male in no acute distress.   Neck: Trach in place  Cardiac:  IR IR,  no murmurs, rubs, or gallops.  Respiratory: Clear to auscultation bilaterally. GI: Soft, nontender, non-distended  MS: ACE Wrap in LLE. 2+ RLE edema No deformity. Neuro:  Nonfocal  Psych: Normal affect   Labs   Chemistry Recent Labs  Lab 07/19/21 0723 07/20/21 0555  NA  --  132*  K  --  3.9  CL  --  96*  CO2  --  25  GLUCOSE 431* 336*  BUN  --  19  CREATININE  --  1.16  CALCIUM  --  8.3*  GFRNONAA  --  >60  ANIONGAP  --  11     Hematology Recent Labs  Lab 07/18/21 0552 07/20/21 0555  WBC 3.4* 3.5*  RBC 3.22* 3.12*  HGB 9.1* 8.9*  HCT 29.0* 27.7*  MCV 90.1 88.8  MCH 28.3 28.5  MCHC 31.4 32.1  RDW 16.1* 16.2*  PLT PLATELET CLUMPS NOTED ON SMEAR, UNABLE TO ESTIMATE 111*    Radiology    DG Knee 1-2 Views Left  Result Date: 07/21/2021 CLINICAL DATA:  Left knee pain after attempting to walk in rehab. History of fall. EXAM: LEFT KNEE - 1-2 VIEW COMPARISON:  None. FINDINGS: Small knee joint effusion. No fracture or dislocation. No evidence of lipohemarthrosis. 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 No evidence of chondrocalcinosis. Scattered adjacent vascular calcifications. No  radiopaque foreign body. IMPRESSION: 1. Small knee joint effusion without associated fracture or lipohemarthrosis. 2. 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 Electronically Signed   By: Sandi Mariscal M.D.   On: 07/21/2021 16:56   NM Myocar Multi W/Spect W/Wall Motion / EF  Result Date: 07/22/2021   Findings are equivocal.   There is infiltration of the IV with extravasation of the myoview into the patients arm during the stress images.   The study in uninterpretable .   ECHOCARDIOGRAM COMPLETE  Result Date: 07/22/2021    ECHOCARDIOGRAM REPORT   Patient Name:   Tony Young [facility] Date of Exam: 07/22/2021 Medical Rec #:  [id number]              Height:       67.0 in Accession #:    [id number]             Weight:       248.0 lb Date of Birth:  [birth year]              BSA:          2.216 m Patient Age:    66 years               BP:           116/60 mmHg Patient Gender: M                      HR:           112 bpm. Exam Location:  Inpatient Procedure: 2D Echo, Cardiac Doppler and Color Doppler Indications:    I50.21 CHF  History:        Patient has no prior history of Echocardiogram examinations.                 Cardiomyopathy, CAD, COPD; Risk Factors:Diabetes.  Sonographer:    Beryle Beams Referring Phys: 22 RHONDA G BARRETT  Sonographer Comments: Patient is morbidly obese. IMPRESSIONS  1. Left ventricular ejection fraction, by estimation, is 40 to 45%. The left ventricle has mildly decreased function. Left ventricular endocardial border not optimally defined to evaluate regional wall motion. There is mild left ventricular hypertrophy.  Left ventricular diastolic parameters are indeterminate.  2. Right ventricular systolic function is normal. The right ventricular size is normal.  3. The mitral valve is normal in  structure. No evidence of mitral valve regurgitation. No evidence of mitral stenosis.  4. The aortic valve is calcified. There is mild calcification of the aortic valve.  There is mild thickening of the aortic valve. Aortic valve regurgitation is not visualized. Aortic valve sclerosis is present, with no evidence of aortic valve stenosis.  5. The inferior vena cava is normal in size with greater than 50% respiratory variability, suggesting right atrial pressure of 3 mmHg. FINDINGS  Left Ventricle: Left ventricular ejection fraction, by estimation, is 40 to 45%. The left ventricle has mildly decreased function. Left ventricular endocardial border not optimally defined to evaluate regional wall motion. The left ventricular internal cavity size was normal in size. There is mild left ventricular hypertrophy. Left ventricular diastolic parameters are indeterminate. Right Ventricle: The right ventricular size is normal. No increase in right ventricular wall thickness. Right ventricular systolic function is normal. Left Atrium: Left atrial size was normal in size. Right Atrium: Right atrial size was normal in size. Pericardium: There is no evidence of pericardial effusion. Mitral Valve: The mitral valve is normal in structure. No evidence of mitral valve regurgitation. No evidence of mitral valve stenosis. Tricuspid Valve: The tricuspid valve is normal in structure. Tricuspid valve regurgitation is not demonstrated. No evidence of tricuspid stenosis. Aortic Valve: The aortic valve is calcified. There is mild calcification of the aortic valve. There is mild thickening of the aortic valve. Aortic valve regurgitation is not visualized. Aortic valve sclerosis is present, with no evidence of aortic valve stenosis. Aortic valve mean gradient measures 3.0 mmHg. Aortic valve peak gradient measures 5.3 mmHg. Aortic valve area, by VTI measures 1.96 cm. Pulmonic Valve: The pulmonic valve was normal in structure. Pulmonic valve regurgitation is not visualized. No evidence of pulmonic stenosis. Aorta: The aortic root is normal in size and structure. Venous: The inferior vena cava is normal in size with  greater than 50% respiratory variability, suggesting right atrial pressure of 3 mmHg. IAS/Shunts: No atrial level shunt detected by color flow Doppler.  LEFT VENTRICLE PLAX 2D LVIDd:         5.00 cm LVIDs:         4.30 cm LV PW:         1.40 cm LV IVS:        1.00 cm LVOT diam:     2.20 cm LV SV:         41 LV SV Index:   18 LVOT Area:     3.80 cm  LV Volumes (MOD) LV vol d, MOD A2C: 88.5 ml LV vol d, MOD A4C: 102.0 ml LV vol s, MOD A2C: 43.1 ml LV vol s, MOD A4C: 56.4 ml LV SV MOD A2C:     45.4 ml LV SV MOD A4C:     102.0 ml LV SV MOD BP:      45.9 ml RIGHT VENTRICLE             IVC RV S prime:     10.30 cm/s  IVC diam: 2.00 cm TAPSE (M-mode): 1.8 cm LEFT ATRIUM             Index        RIGHT ATRIUM           Index LA diam:        4.00 cm 1.81 cm/m   RA Area:     17.20 cm LA Vol (A2C):   48.0 ml 21.66 ml/m  RA Volume:   51.90 ml  23.42 ml/m LA Vol (A4C):   62.9 ml 28.39 ml/m LA Biplane Vol: 55.9 ml 25.23 ml/m  AORTIC VALVE                    PULMONIC VALVE AV Area (Vmax):    2.61 cm     PV Vmax:       0.70 m/s AV Area (Vmean):   2.36 cm     PV Vmean:      47.000 cm/s AV Area (VTI):     1.96 cm     PV VTI:        0.131 m AV Vmax:           115.00 cm/s  PV Peak grad:  1.9 mmHg AV Vmean:          82.900 cm/s  PV Mean grad:  1.0 mmHg AV VTI:            0.208 m AV Peak Grad:      5.3 mmHg AV Mean Grad:      3.0 mmHg LVOT Vmax:         79.10 cm/s LVOT Vmean:        51.400 cm/s LVOT VTI:          0.107 m LVOT/AV VTI ratio: 0.52  AORTA Ao Root diam: 2.60 cm Ao Asc diam:  2.10 cm  SHUNTS Systemic VTI:  0.11 m Systemic Diam: 2.20 cm Candee Furbish MD Electronically signed by Candee Furbish MD Signature Date/Time: 07/22/2021/11:05:23 AM    Final     Cardiac Studies   Echo 07/22/21 1. Left ventricular ejection fraction, by estimation, is 40 to 45%. The  left ventricle has mildly decreased function. Left ventricular endocardial  border not optimally defined to evaluate regional wall motion. There is  mild left  ventricular hypertrophy.   Left ventricular diastolic parameters are indeterminate.   2. Right ventricular systolic function is normal. The right ventricular  size is normal.   3. The mitral valve is normal in structure. No evidence of mitral valve  regurgitation. No evidence of mitral stenosis.   4. The aortic valve is calcified. There is mild calcification of the  aortic valve. There is mild thickening of the aortic valve. Aortic valve  regurgitation is not visualized. Aortic valve sclerosis is present, with  no evidence of aortic valve stenosis.   5. The inferior vena cava is normal in size with greater than 50%  respiratory variability, suggesting right atrial pressure of 3 mmHg.   Stress test 07/22/21   Findings are equivocal.   There is infiltration of the IV with extravasation of the myoview into the patients arm during the stress images.   The study in uninterpretable   Patient Profile     66 y.o. male history of COPD, OSA, CAD, CHF, type 2 diabetes and atrial fibrillation here after complicated hospitalization including necrotizing fasciitis of his left lower extremity and developed acute respiratory failure, multiorgan dysfunction, GI bleed, shock liver, and ultimately underwent tracheostomy placement.  The patient is now being considered for skin grafting and Cardiology is consulted for preoperative CV risk assessment.  Assessment & Plan    PAF - not on tele. Continue amio and BB. On Lovenox.   2. 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  - Started Jardiance - Continue metoprolol  - Continue lasix 20mg  BID   3. Pre-op clearance - Echo with mildly reduce LVEF. IV infiltrated during stress test. For coronary CT today. HR in  90-100s on metoprolol 50mg  BID. Will review with [facility] staff.    For questions or updates, please contact [facility] Please consult [email or web address] for contact info under        Signed, Leanor Kail, PA  07/23/2021, 9:21 AM     ATTENDING  ATTESTATION:  After conducting a review of all available clinical information with the care team, interviewing the patient, and performing a physical exam, I agree with the findings and plan described in this note.   GEN: No acute distress.  Trach in place Neck: No JVD Cardiac: Irregular RR, no murmurs, rubs, or gallops.  Respiratory: Coarse bilaterally GI: Soft, nontender, non-distended  MS: +2 edema with lower legs bandaged Neuro: Diffusely weak Psych: Normal affect   Unable to get lexiscan or CTA to evaluate for high risk ischemia.  Will refer for DSE.  Echo with only mild LV dysfunction (not high risk findings).  Lenna Sciara, MD Pager [phone]

## 2021-07-23 NOTE — Progress Notes (Signed)
CT canceled today due to pt BP and HR being out of goal range of 55-65 BPM. Pt HR is currently in mid 80's to upper 90's. Echocardiogram stress test is to be done tomorrow. Pt is to be NPO at midnight.

## 2021-07-24 ENCOUNTER — Inpatient Hospital Stay (HOSPITAL_COMMUNITY): Payer: Medicare Other

## 2021-07-24 ENCOUNTER — Ambulatory Visit (HOSPITAL_COMMUNITY): Payer: Medicare Other

## 2021-07-24 DIAGNOSIS — Z01810 Encounter for preprocedural cardiovascular examination: Secondary | ICD-10-CM

## 2021-07-24 DIAGNOSIS — G7281 Critical illness myopathy: Secondary | ICD-10-CM | POA: Diagnosis not present

## 2021-07-24 LAB — CBC WITH DIFFERENTIAL/PLATELET
Abs Immature Granulocytes: 0 10*3/uL (ref 0.00–0.07)
Basophils Absolute: 0 10*3/uL (ref 0.0–0.1)
Basophils Relative: 0 %
Eosinophils Absolute: 0 10*3/uL (ref 0.0–0.5)
Eosinophils Relative: 0 %
HCT: 27 % — ABNORMAL LOW (ref 39.0–52.0)
Hemoglobin: 8.6 g/dL — ABNORMAL LOW (ref 13.0–17.0)
Lymphocytes Relative: 30 %
Lymphs Abs: 1.3 10*3/uL (ref 0.7–4.0)
MCH: 28.3 pg (ref 26.0–34.0)
MCHC: 31.9 g/dL (ref 30.0–36.0)
MCV: 88.8 fL (ref 80.0–100.0)
Monocytes Absolute: 0.5 10*3/uL (ref 0.1–1.0)
Monocytes Relative: 12 %
Neutro Abs: 2.4 10*3/uL (ref 1.7–7.7)
Neutrophils Relative %: 58 %
Platelets: 122 10*3/uL — ABNORMAL LOW (ref 150–400)
RBC: 3.04 MIL/uL — ABNORMAL LOW (ref 4.22–5.81)
RDW: 17.2 % — ABNORMAL HIGH (ref 11.5–15.5)
WBC: 4.2 10*3/uL (ref 4.0–10.5)
nRBC: 1 % — ABNORMAL HIGH (ref 0.0–0.2)
nRBC: 2 /100 WBC — ABNORMAL HIGH

## 2021-07-24 LAB — BASIC METABOLIC PANEL
Anion gap: 12 (ref 5–15)
BUN: 18 mg/dL (ref 8–23)
CO2: 21 mmol/L — ABNORMAL LOW (ref 22–32)
Calcium: 8 mg/dL — ABNORMAL LOW (ref 8.9–10.3)
Chloride: 100 mmol/L (ref 98–111)
Creatinine, Ser: 1.47 mg/dL — ABNORMAL HIGH (ref 0.61–1.24)
GFR, Estimated: 53 mL/min — ABNORMAL LOW (ref >60)
Glucose, Bld: 173 mg/dL — ABNORMAL HIGH (ref 70–99)
Potassium: 4.6 mmol/L (ref 3.5–5.1)
Sodium: 133 mmol/L — ABNORMAL LOW (ref 135–145)

## 2021-07-24 LAB — GLUCOSE, CAPILLARY
Glucose-Capillary: 139 mg/dL — ABNORMAL HIGH (ref 70–99)
Glucose-Capillary: 142 mg/dL — ABNORMAL HIGH (ref 70–99)
Glucose-Capillary: 102 mg/dL — ABNORMAL HIGH (ref 70–99)
Glucose-Capillary: 163 mg/dL — ABNORMAL HIGH (ref 70–99)

## 2021-07-24 MED ORDER — PERFLUTREN LIPID MICROSPHERE
1.0000 mL | INTRAVENOUS | Status: AC | PRN
  Administered 2021-07-24: 5 mL via INTRAVENOUS
  Filled 2021-07-24: qty 10

## 2021-07-24 MED ORDER — METOPROLOL TARTRATE 25 MG PO TABS
25.0000 mg | ORAL_TABLET | Freq: Two times a day (BID) | ORAL | Status: DC
  Administered 2021-07-24 – 2021-07-28 (×9): 25 mg via ORAL
  Filled 2021-07-24 (×9): qty 1

## 2021-07-24 MED ORDER — METOPROLOL TARTRATE 5 MG/5ML IV SOLN
2.5000 mg | INTRAVENOUS | Status: DC | PRN
Start: 2021-07-24 — End: 2021-07-24

## 2021-07-24 MED ORDER — DAKINS (1/4 STRENGTH) 0.125 % EX SOLN
Freq: Two times a day (BID) | CUTANEOUS | Status: AC
Start: 2021-07-24 — End: 2021-07-27
  Filled 2021-07-24 (×2): qty 473

## 2021-07-24 MED ORDER — FREE WATER
50.0000 mL | Freq: Two times a day (BID) | Status: DC
  Administered 2021-07-24 – 2021-07-29 (×10): 50 mL

## 2021-07-24 MED ORDER — SORBITOL 70 % SOLN
30.0000 mL | Freq: Once | Status: AC
Start: 2021-07-24 — End: 2021-07-24
  Administered 2021-07-24: 30 mL via ORAL
  Filled 2021-07-24: qty 30

## 2021-07-24 NOTE — Progress Notes (Signed)
Occupational Therapy Session Note  Patient Details  Name: Tony Young MRN: [id number] Date of Birth: 07-05-1955  Today's Date: 07/24/2021 OT Individual Time: 1032-1130 OT Individual Time Calculation (min): 58 min    Short Term Goals: Week 1:  OT Short Term Goal 1 (Week 1): Patient will compelte UB bathing with Max A in supine. OT Short Term Goal 2 (Week 1): Patient will complete UB dressing wtih Max A in supine. OT Short Term Goal 3 (Week 1): Patient will maintain static sitting balance with Mod A seated EOB in prep for ADLs. OT Short Term Goal 4 (Week 1): Patient will complete sit to stand transfer with Max A +2 and LRAD.  Skilled Therapeutic Interventions/Progress Updates:    Pt in bed during session, just coming back from procedure.  He voiced concern with left digit flexion and being able to hold his phone with the left hand.  Therapist completed retrograde massage to the left digits and hand secondary to increased edema with AAROM.  Also had pt work on AROM and strengthening to the left hand with use of the pink foam block.  Pt voices concerns as well of not being able to manage his cell phone or the room phone.  Discussed options for possible using a pop socket on his phone with therapist showing him an example, in addition to continuing to work on self AROM and strengthening.  Also discussed possible options of adapting the room phone as well to make it easier to hang onto.  Will continue to tiral things in therapy to assist with him being able to use the phone.  He was left with soft touch call button in reach and with foam grip in hand working on strengthening and ROM of the digits.    Therapy Documentation Precautions:  Precautions Precautions: Fall Precaution Comments: trach, PEG, unable to read, fragile skin Restrictions Weight Bearing Restrictions: No       Therapy/Group: Individual Therapy  Giulio Bertino OTR/L 07/24/2021, 4:23 PM

## 2021-07-24 NOTE — Progress Notes (Signed)
Occupational Therapy Weekly Progress Note  Patient Details  Name: Tony Young MRN: [id number] Date of Birth: [birth year]  Beginning of progress report period: July 16, 2021 End of progress report period: July 24, 2021    Patient has met 0 of 4 short term goals.  Pt has made limited progress toward OT goals, limited by recent NPO status for procedure and weakness. Pt is Max-Total A of 2 for all bed mobility, maximove for transfers, and total A for self feeding and grooming tasks. Pt is also resistant to most education and easily frustrated when asked to complete all tasks.  Patient continues to demonstrate the following deficits: muscle weakness, decreased cardiorespiratoy endurance, impaired timing and sequencing, abnormal tone, unbalanced muscle activation, ataxia, decreased coordination, and decreased motor planning, decreased motor planning, and decreased sitting balance, decreased standing balance, decreased postural control, and decreased balance strategies and therefore will continue to benefit from skilled OT intervention to enhance overall performance with BADL and Reduce care partner burden.  Patient progressing toward long term goals..  Continue plan of care.  OT Short Term Goals Week 1:  OT Short Term Goal 1 (Week 1): Patient will compelte UB bathing with Max A in supine. OT Short Term Goal 1 - Progress (Week 1): Progressing toward goal OT Short Term Goal 2 (Week 1): Patient will complete UB dressing wtih Max A in supine. OT Short Term Goal 2 - Progress (Week 1): Progressing toward goal OT Short Term Goal 3 (Week 1): Patient will maintain static sitting balance with Mod A seated EOB in prep for ADLs. OT Short Term Goal 3 - Progress (Week 1): Progressing toward goal OT Short Term Goal 4 (Week 1): Patient will complete sit to stand transfer with Max A +2 and LRAD. OT Short Term Goal 4 - Progress (Week 1): Not met Week 2:  OT Short Term Goal 1 (Week 2): Patient will  maintain static sitting balance with Mod A seated EOB in prep for ADLs. OT Short Term Goal 2 (Week 2): Pt will perform UB bathing in supine/HOB elevated Max A OT Short Term Goal 3 (Week 2): Pt will improve functional use of hands to grasp/release 3/3 items OT Short Term Goal 4 (Week 2): Pt will perform stedy transfer with 1 helper     Therapy Documentation Precautions:  Precautions Precautions: Fall Precaution Comments: trach, PEG, unable to read, fragile skin Restrictions Weight Bearing Restrictions: No     Therapy/Group: Individual Therapy  Viona Gilmore 07/24/2021, 4:23 PM

## 2021-07-24 NOTE — Progress Notes (Signed)
Physical Therapy Session Note  Patient Details  Name: Tony Young MRN: [id number] Date of Birth: 1955-08-18  Today's Date: 07/24/2021 PT Missed Time: 60 Minutes Missed Time Reason: Unavailable (Comment) (off unit for stress test)  Short Term Goals: Week 1:  PT Short Term Goal 1 (Week 1): Pt will complete bed mobility with maxA of 1 person PT Short Term Goal 2 (Week 1): Pt will complete bed<>chair transfer with maxA +2 PT Short Term Goal 3 (Week 1): Pt will complete sit<>stand transfer with maxA +2 PT Short Term Goal 4 (Week 1): Pt will tolerate sitting in TIS w/c for >2 hrs outside of therapy  Skilled Therapeutic Interventions/Progress Updates:    Pt on his way out for testing on therapist's arrival.  Missed 60 min of scheduled PT, will make up as able.   Therapy Documentation Precautions:  Precautions Precautions: Fall Precaution Comments: trach, PEG, unable to read, fragile skin Restrictions Weight Bearing Restrictions: No General: PT Amount of Missed Time (min): 60 Minutes PT Missed Treatment Reason: Unavailable (Comment) (off unit for stress test)    Therapy/Group: Individual Therapy  Mickel Fuchs 07/24/2021, 3:19 PM

## 2021-07-24 NOTE — Consult Note (Signed)
[facility] Nurse Consult Note: Patient receiving care in [place]. Consult completed remotely after review of record and phone conversation with P. Love, PA-C. Reason for Consult: LLE with odor and increased drainage Wound type: full thickness, extensive LLE wounds Pressure Injury POA: Yes/No/NA Measurement: na Wound bed: not viewed today Drainage (amount, consistency, odor) PA-C P. Love reports increased drainage with a blue/green tint color. Periwound: Dressing procedure/placement/frequency: In collaboration with Ms. Erling Cruz, I have ordered twice daily use of 1/4% Dakin's solution moistened gauze, then to resume Aquacel Advantage next week. Orders updated to reflect this change in POC.  Thank you for the consult.  [facility] nurse will not follow at this time.  Please re-consult the [place] team if needed.  Val Riles, RN, MSN, CWOCN, CNS-BC, pager [phone]

## 2021-07-24 NOTE — Progress Notes (Signed)
Occupational Therapy Session Note  Patient Details  Name: Tony Young MRN: [id number] Date of Birth: Sep 04, 1955  Today's Date: 07/25/2021 OT Individual Time: 1224-8250 OT Individual Time Calculation (min): 33 min  27 minutes missed  Skilled Therapeutic Interventions/Progress Updates:    Pt greeted in bed, asleep, easily to rouse though a bit irritated that he was wakened. He reported not sleeping well last night. Per nurse, pt had low 02 sats last night. Pt satting at 94-95% at rest in bed via continuous pulse ox. He declined sitting EOB due to fatigue. Agreeable to bedlevel exercises, agreeable to participate and assist OT. AAROM B UEs performed to increase pts functional independence during self care tasks. Started with ROM needed for self feeding, including shoulder flexion/extension and elbow flexion/extension. Pt limited with elbow extension, pt with +pain. All ROM performed in ranges where pain was manageable, note pt with low tolerance and also easily agitated. Worked on forearm rotations, pt once again with limitations (this time in supination). Wrist extension/flexion, digit flexion/extension also completed with AAROM. Concern for contracture development Rt hand. Educated pt on ways that he can help "stretch his fingers" using modified techniques. Emphasized importance of self ROM for edema control, preservation of functional ranges, and contracture prevention. Pt at this point in session required cues for alertness and attention to task, appeared to be drowsing off. He remained in bed at close of session, soft touch call bell on lap. Tx time missed due to pt refusal/fatigue.   Therapy Documentation Precautions:  Precautions Precautions: Fall Precaution Comments: trach, PEG, unable to read, fragile skin Restrictions Weight Bearing Restrictions: No General: General OT Amount of Missed Time: 27 Minutes Vital Signs: Therapy Vitals Temp: 98.8 F (37.1 C) Pulse Rate:  89 Resp: 19 BP: 108/87 Patient Position (if appropriate): Lying Oxygen Therapy SpO2: 97 % O2 Device: Room Air Pain: AAROM modified to prevent pain   ADL: ADL Eating: Maximal assistance Where Assessed-Eating: Bed level Grooming: Maximal assistance Where Assessed-Grooming: Bed level Upper Body Bathing: Maximal assistance Where Assessed-Upper Body Bathing: Bed level Lower Body Bathing: Dependent Where Assessed-Lower Body Bathing: Bed level Upper Body Dressing: Dependent Where Assessed-Upper Body Dressing: Bed level Lower Body Dressing: Dependent Where Assessed-Lower Body Dressing: Bed level Toileting: Dependent Where Assessed-Toileting: Bed level Toilet Transfer Method: Not assessed   Therapy/Group: Individual Therapy  Enas Winchel A Dimetrius Montfort 07/25/2021, 3:45 PM

## 2021-07-24 NOTE — Progress Notes (Signed)
Physical Therapy Wound Treatment Patient Details  Name: Tony Young MRN: [id number] Date of Birth: 07/15/1955  Today's Date: 07/24/2021 PT Individual Time: 1102-1145 PT Individual Time Calculation (min): 43 min   Subjective  Subjective: Pt pleasant and agreeable to hydrotherapy Patient and Family Stated Goals: Heal wound Date of Onset:  (Unknown) Prior Treatments: Dressing changes  Pain Score:  Pt tolerated treatment well with no complaints of pain.   Wound Assessment  Pressure Injury 07/15/21 Buttocks Left Unstageable - Full thickness tissue loss in which the base of the injury is covered by slough (yellow, tan, gray, green or brown) and/or eschar (tan, brown or black) in the wound bed. (Active)  Dressing Type Foam - Lift dressing to assess site every shift;Gauze (Comment);Santyl;Barrier Film (skin prep);Moist to moist 07/24/21 1540  Dressing Clean, Dry, Intact;Changed 07/24/21 1540  Dressing Change Frequency Daily 07/24/21 1540  State of Healing Eschar 07/24/21 1540  Site / Wound Assessment Pink;Yellow;Red;Bleeding;Painful;Black 07/24/21 1540  % Wound base Red or Granulating 5% 07/24/21 1540  % Wound base Yellow/Fibrinous Exudate 75% 07/24/21 1540  % Wound base Black/Eschar 20% 07/24/21 1540  % Wound base Other/Granulation Tissue (Comment) 0% 07/24/21 1540  Peri-wound Assessment Intact 07/24/21 1540  Wound Length (cm) 7.2 cm 07/17/21 1518  Wound Width (cm) 7 cm 07/17/21 1518  Wound Depth (cm) 0.2 cm 07/17/21 1518  Wound Surface Area (cm^2) 50.4 cm^2 07/17/21 1518  Wound Volume (cm^3) 10.08 cm^3 07/17/21 1518  Tunneling (cm) 0 07/17/21 1518  Undermining (cm) 0 07/17/21 1518  Margins Unattached edges (unapproximated) 07/24/21 1540  Drainage Amount Minimal 07/24/21 1540  Drainage Description Serosanguineous;Odor - foul 07/24/21 1540  Treatment Debridement (Selective);Hydrotherapy (Pulse lavage);Packing (Saline gauze) 07/24/21 1540      Hydrotherapy Pulsed lavage  therapy - wound location: L buttock Pulsed Lavage with Suction (psi): 12 psi Pulsed Lavage with Suction - Normal Saline Used: 1000 mL Pulsed Lavage Tip: Tip with splash shield Selective Debridement Selective Debridement - Location: L buttock Selective Debridement - Tools Used: Forceps;Scalpel Selective Debridement - Tissue Removed: yellow eschar, slough   Wound Assessment and Plan  Wound Therapy - Assess/Plan/Recommendations Wound Therapy - Clinical Statement: Overall, wound with a foul odor today and noted increased black necrotic tissue present in the wound. Discussed with Dr. Dagoberto Ligas, and feel this wound could benefit from 3 days of Dakin's. If improved enough by Monday, the VAC may still be appropriate to place, otherwise will continue with traditional hydrotherapy and revisit the possibility of the VAC at a later date. Wound Therapy - Functional Problem List: Immobility Factors Delaying/Impairing Wound Healing: Diabetes Mellitus;Immobility;Multiple medical problems;Polypharmacy Hydrotherapy Plan: Debridement;Dressing change;Patient/family education;Pulsatile lavage with suction Wound Therapy - Frequency: 6X / week Wound Therapy - Follow Up Recommendations: dressing changes by RN  Wound Therapy Goals- Improve the function of patient's integumentary system by progressing the wound(s) through the phases of wound healing (inflammation - proliferation - remodeling) by: Decrease Necrotic Tissue to: 20% Decrease Necrotic Tissue - Progress: Not progressing Increase Granulation Tissue to: 80% Increase Granulation Tissue - Progress: Mot progressing Goals/treatment plan/discharge plan were made with and agreed upon by patient/family: Yes Time For Goal Achievement: 7 days Wound Therapy - Potential for Goals: Good  Goals will be updated until maximal potential achieved or discharge criteria met.  Discharge criteria: when goals achieved, discharge from hospital, MD decision/surgical intervention,  no progress towards goals, refusal/missing three consecutive treatments without notification or medical reason.  GP     [name] 07/24/2021, 3:48 PM  [name]  Rich Reining, PT, DPT Acute Rehabilitation Services Pager: [phone] Office: [phone]

## 2021-07-24 NOTE — Progress Notes (Addendum)
Nutrition Follow-up  DOCUMENTATION CODES:   Obesity unspecified  INTERVENTION:  Once diet advances,   Continue Ensure Enlive po BID, each supplement provides 350 kcal and 20 grams of protein.  Continue Juven BID, each packet provides 95 calories, 2.5 grams of protein.  Encourage PO intake.   NUTRITION DIAGNOSIS:   Increased nutrient needs related to other (see comment) (critical illness myopathy) as evidenced by estimated needs; ongoing  GOAL:   Patient will meet greater than or equal to 90% of their needs; previously met on dysphagia 2 diet.   MONITOR:   PO intake, Supplement acceptance, Diet advancement, Labs, Weight trends, Skin, I & O's  REASON FOR ASSESSMENT:   Consult Assessment of nutrition requirement/status, Calorie Count  ASSESSMENT:   Pt is a 66 y.o. male with medical history significant of COPD, OSA, CAD/CHF (non-compliance with lasix), colon cancer s/p RSXN, T2DM, who was initially admitted to [facility] on 05/13/2021 with complaints of worsening SOB, fever, tachypnea and hypoxia and found to have necrotizing fasciitis of the left lower extremity and underwent multiple debridements. Hospital course was complicated by AKI needing dialysis, respiratory failure, sepsis with shock, GI bleed, bacteremia. He had respiratory failure due to sepsis and shock and was placed on mechanical ventilation and tracheostomy. Pt was eventually stabilized and transferred to [facility] and presented with fever after admission and developed SIRS/MDRO PNA. Once respiratory status improved and pt extubated, pt was admitted to CIR due to functional decline due to critical illness myopathy  Pt is currently on a clear liquid diet due to stress test today. Pt additionally was NPO yesterday due to coronary CTA. Pt has been reporting hunger. Meal completion has been varied from 25-80%. Pt currently has Ensure ordered and had been consuming them. Juven ordered per tube for wound  healing. Calorie count done 2/20. Pt meeting nutrition needs. Tube feeds have been discontinued. Once diet advances, recommend continuation of nutritional supplementation to aid in increased caloric and protein needs. Free water flushes modified to 50 ml BID via PEG for tube maintenance.   Labs and medications reviewed.   Diet Order:   Diet Order             Diet clear liquid Room service appropriate? Yes; Fluid consistency: Thin  Diet effective now                   EDUCATION NEEDS:   Education needs have been addressed  Skin:  Skin Assessment: Skin Integrity Issues: Skin Integrity Issues:: Unstageable Unstageable: buttocks Incisions: dehisced; non pressure wound pretibial left wound to LLE  Last BM:  2/22  Height:   Ht Readings from Last 1 Encounters:  07/15/21 _0  (1.702 m)    Weight:   Wt Readings from Last 1 Encounters:  07/24/21 112.9 kg    Ideal Body Weight:  67.2 kg  BMI:  Body mass index is 38.98 kg/m.  Estimated Nutritional Needs:   Kcal:  2250 - 2450  Protein:  110 - 125 grams  Fluid:  >/= 2.2 L  Corrin Parker, MS, RD, LDN RD pager number/after hours weekend pager number on Amion.

## 2021-07-24 NOTE — Progress Notes (Signed)
Echocardiogram stress test canceled due to pt receiving metoprolol this morning. Will attempt again tomorrow. Diet resumed  Gerald Stabs, RN

## 2021-07-24 NOTE — Progress Notes (Signed)
Occupational Therapy Session Note  Patient Details  Name: Tony Young MRN: [id number] Date of Birth: 03/31/1956  Today's Date: 07/24/2021 OT Individual Time: 6468-0321 and 2248-2500 OT Individual Time Calculation (min): 24 min and 24 min   Short Term Goals: Week 1:  OT Short Term Goal 1 (Week 1): Patient will compelte UB bathing with Max A in supine. OT Short Term Goal 2 (Week 1): Patient will complete UB dressing wtih Max A in supine. OT Short Term Goal 3 (Week 1): Patient will maintain static sitting balance with Mod A seated EOB in prep for ADLs. OT Short Term Goal 4 (Week 1): Patient will complete sit to stand transfer with Max A +2 and LRAD.   Skilled Therapeutic Interventions/Progress Updates:    Session 1: Pt in bed at time of session resting, aware that he is still NPO and does not know what procedure is being done, but confirmed with nursing prior that pt is NPO except for small sips of water and relayed this to pt. Pt declining ADL tasks, initially unmotivated to participate. No 2+ this session. Focused session on B hand ROM primarily at [facility] on both hands as he has limited ROM and potential for contracture development 2/2 nonuse. OT performing retrograde massage to reduce edema, improve blood flow, while passively stretching all digits on B hands. Also performing hygiene with hand washing as pt hands are malodorous and having skin build up. Brief contracture prevention education as pt only mildly receptive. NT present at this time and pt stating he needed to be changed, bed mobility with MOD of 2, dependent for brief change and hygiene. Left bed level soft call in reach all needs met.      Session 2: Pt greeted at time of session bed level resting with nursing finishing up med pass. Pt back from procedure and wanting to "wash his hair." Communicated with pt he is not ready for shower level bathing, and 2/2 time cannot do at the sink but pt agreeable to shower cap at bed  level. HOB raised and shower cap heated for comfort, assisted pt with donning shower cap and massaging into scalp, wetting/rinsing with wash cloths, and combing out hair. Note encouraged the pt to complete all of this for grooming tasks and to improve functional use of B hands, however pt stating "I cannot reach that high!" And refusing to attempt so OT assisting. Pt also wanting small sip of water per protocol and OT providing, pt becoming agitated with the way cup was being held at this time, directing his care. Pt left bed level with call bell in reach all needs met.   Therapy Documentation Precautions:  Precautions Precautions: Fall Precaution Comments: trach, PEG, unable to read, fragile skin Restrictions Weight Bearing Restrictions: No    Therapy/Group: Individual Therapy  Viona Gilmore 07/24/2021, 7:15 AM

## 2021-07-24 NOTE — Progress Notes (Signed)
PROGRESS NOTE   Subjective/Complaints:  Pt upset he's NPO again for stress test.  HR was in 140s this AM- down to 100s after meds via PEG.  Had Tm 1001. This AM- feels "fine"- no additional temps.  WBC 4.2.  L knee sore stil and wants pain meds.  No BM lately- feels full and distended.   Got EOB with therapy yesterday.  ACE wrap on L knee.    ROS:  Pt denies SOB, abd pain, CP, N/V/(+) C/D, and vision changes      Objective:   NM Myocar Multi W/Spect W/Wall Motion / EF  Result Date: 07/22/2021   Findings are equivocal.   There is infiltration of the IV with extravasation of the myoview into the patients arm during the stress images.   The study in uninterpretable .   Recent Labs    07/24/21 0248  WBC 4.2  HGB 8.6*  HCT 27.0*  PLT 122*    Recent Labs    07/24/21 0248  NA 133*  K 4.6  CL 100  CO2 21*  GLUCOSE 173*  BUN 18  CREATININE 1.47*  CALCIUM 8.0*     Intake/Output Summary (Last 24 hours) at 07/24/2021 1032 Last data filed at 07/24/2021 1008 Gross per 24 hour  Intake 478 ml  Output 1075 ml  Net -597 ml     Pressure Injury 07/15/21 Buttocks Left Unstageable - Full thickness tissue loss in which the base of the injury is covered by slough (yellow, tan, gray, green or brown) and/or eschar (tan, brown or black) in the wound bed. (Active)  07/15/21 1520  Location: Buttocks  Location Orientation: Left  Staging: Unstageable - Full thickness tissue loss in which the base of the injury is covered by slough (yellow, tan, gray, green or brown) and/or eschar (tan, brown or black) in the wound bed.  Wound Description (Comments):   Present on Admission: Yes    Physical Exam: Vital Signs Blood pressure 108/68, pulse (!) 105, temperature 98.8 F (37.1 C), resp. rate 18, height 5\' 7"  (1.702 m), weight 112.9 kg, SpO2 94 %.          General: awake, alert, appropriate, laying supine in air mattress;  Prevalon boot on RLENAD HENT: conjugate gaze; oropharynx dry- Trach with PMV_ off O2 CV: regular rhythm but borderline tachycardic  rate; no JVD Pulmonary: CTA B/L; no W/R/R- good air movement GI: firm- not hard- distended; NT; hypoactive BS Psychiatric: appropriate Neurological: Ox3  Musculoskeletal:   R shoulder (+) impingement vs partial RTC tear on R shoulder- Also TTP posteriorly.       General: Swelling present.     Comments: 2-3+ edema LLE. Dependent edema bilateral forearms R>L.   Skin:    General: Skin is warm and dry.     Comments: Multiple scabs on bilateral hands. LLE with bulking dressing and dried serous drainage-  .- a lot of ecchymoses in arms B/L    LLE- looks less beefy and deeper red with clots that appear to be ON top of the tissue from bleeding as compared to in the tissue- from dressings? More slough as well Neurological:     Mental Status: He  is alert and oriented to person, place, and time.     Comments: Speech mildly dysarthric without dysphonia. Able to follow simple motor commands without difficulty. RUE weakness noted. Tends to keep LLE rotated outwards    07/20/21  Assessment/Plan: 1. Functional deficits which require 3+ hours per day of interdisciplinary therapy in a comprehensive inpatient rehab setting. Physiatrist is providing close team supervision and 24 hour management of active medical problems listed below. Physiatrist and rehab team continue to assess barriers to discharge/monitor patient progress toward functional and medical goals  Care Tool:  Bathing    Body parts bathed by patient: Face   Body parts bathed by helper: Right arm, Left arm, Chest, Abdomen, Front perineal area, Buttocks, Right upper leg, Left upper leg, Right lower leg, Left lower leg, Face Body parts n/a: Left lower leg   Bathing assist Assist Level: 2 Helpers     Upper Body Dressing/Undressing Upper body dressing   What is the patient wearing?: Hospital gown only     Upper body assist Assist Level: Total Assistance - Patient < 25%    Lower Body Dressing/Undressing Lower body dressing      What is the patient wearing?: Pants, Underwear/pull up     Lower body assist Assist for lower body dressing: 2 Helpers     Toileting Toileting    Toileting assist Assist for toileting: 2 Helpers     Transfers Chair/bed transfer  Transfers assist  Chair/bed transfer activity did not occur: Safety/medical concerns        Locomotion Ambulation   Ambulation assist   Ambulation activity did not occur: Safety/medical concerns          Walk 10 feet activity   Assist  Walk 10 feet activity did not occur: Safety/medical concerns        Walk 50 feet activity   Assist Walk 50 feet with 2 turns activity did not occur: Safety/medical concerns         Walk 150 feet activity   Assist Walk 150 feet activity did not occur: Safety/medical concerns         Walk 10 feet on uneven surface  activity   Assist Walk 10 feet on uneven surfaces activity did not occur: Safety/medical concerns         Wheelchair     Assist Is the patient using a wheelchair?: Yes Type of Wheelchair: Manual Wheelchair activity did not occur: Safety/medical concerns         Wheelchair 50 feet with 2 turns activity    Assist    Wheelchair 50 feet with 2 turns activity did not occur: Safety/medical concerns       Wheelchair 150 feet activity     Assist  Wheelchair 150 feet activity did not occur: Safety/medical concerns       Blood pressure 108/68, pulse (!) 105, temperature 98.8 F (37.1 C), resp. rate 18, height 5\' 7"  (1.702 m), weight 112.9 kg, SpO2 94 %.  Medical Problem List and Plan: 1. Functional deficits secondary to critical illness myopathy due to prolonged hospitalization and necrotizing fasciitis and resp failure             -patient may shower             -ELOS/Goals: modA 20 days  D/c ~ 4 weeks  Stress test  today/this AM- cont CIR- PT and OT and SLP- couldn't do stress tes tinitially due to B blocker being given- which was due to HR of 140s 2.  Antithrombotics: -DVT/anticoagulation:  Mechanical: Sequential compression devices, below knee Right lower extremity --high risk for DVTs due to body habitus and immobility. Will check dopplers in am. 2/17- pt has extensive DVTs in RLE and some in LLE- as above- placed on tx dose lovenox- will check CBC tomorrow due to risk of new GI bleed   2/20- Plts low 100s, however Hb stable- con't regimen- cannot place on DOAC_ will need Warfarin when change over per pharmacy 2/22- wait to change due to surgical intervention planned.  2/23- d/w Plastic-s will cont lovenox until 1-2 days before surgery then change to heparin drip- will need to go back to acute for skin graft on 3/1             -antiplatelet therapy: N/A 3. Pain: continue Oxycodone prn. 2/16- main pain is R shoulder- con't regimen prn 2/22- L knee looks like swelling/effusion- however likely a strain- con't oxy prnand added tramadol TID  4. Mood: LCSW to follow for evaluation and support.              -antipsychotic agents:  Risperdal bid 5. Neuropsych: This patient is capable of making decisions on his own behalf. 6. Skin/Wound Care: Air mattress due to sacral decub and necrotizing fasciitis LLE-              --continue vitamins and supplements to promote wound healing.  7. Fluids/Electrolytes/Nutrition:  Strict I/O as continues to show signs of overload/anasarca.             --low salt diet. Continue Juven to help with protein stores. Eating 50-100% meals will be able to reduce TF which should help hyperglycemia also drinking 2 cans ensure per day  2/22- will d/w nutrition if needs Tfs anymore- eating meals?- calorie count being finished today.   2/24- hard ot figure out nutrition since been NPO for 3 days now 8. Strep pyogenes bacteremia/Necrotizing fascitis LLE s/p I & D: Continue daily dressing  changes.              --maintain adequate nutritional status to help promote healing. 9. Acute  reps failure s/p trach- and COPD: Respiratory status stable. Continue steriod taper  2/16- con't PMV and SLP for swallowing/speech/phonation  2/20- talking with Resp therapy if can downsize trach- I think we likely can to #4, however will need to discuss directly with Resp therapy.  10 OSA/OHS?/VDRF: Continue button plugging during the day and ATC at nights for humidification             --Started discussion that he will likely go home with trach.  11. H/o GIB: H/H improving. Continue PPI BID.              --no signs of bleeding. Will start low dose Lovenox if platelets stable.   Monitor CBC on anticoagulation  CBC Latest Ref Rng & Units 07/24/2021 07/20/2021 07/18/2021  WBC 4.0 - 10.5 K/uL 4.2 3.5(L) 3.4(L)  Hemoglobin 13.0 - 17.0 g/dL 8.6(L) 8.9(L) 9.1(L)  Hematocrit 39.0 - 52.0 % 27.0(L) 27.7(L) 29.0(L)  Platelets 150 - 400 K/uL 122(L) 111(L) PLATELET CLUMPS NOTED ON SMEAR, UNABLE TO ESTIMATE    2/24- Hb stable- con't regimen 12. Thrombocytopenia: Platelets continue to fluctuate.  --Will recheck CBC in am to decide on initiation of [facility]  2/16- Plts up to 166k  2/20- Plts down to 111k- will recheck Thursday 13.  T2DM:  Check Hgb A1c in am. Will monitor BS ac/hs with SSI for elevated BS  Add 2U Semglee HS             --continue tube feeds at nights (likely contributing to diarrhea)     2/17- Pt CBGs are running 200sto300s- will increase Semglee to 6 units nightly- from 2 units and monitor for trend CBG (last 3)  Recent Labs    07/23/21 1618 07/23/21 2059 07/24/21 0603  GLUCAP 107* 245* 139*  Increase semglee to 20U change SSI to Moderate   2/20- just got increased yesterday- wait to change meds for 24 hours- in midst of calorie count to see if can stop TF's.   2/21- will increase semglee to 24 units and maintain SSI- since CBGs are in 200s-300s-   2/22- pt NPO earlier today so  CBGs looked good- however back up to 289 this evening- if still up in AM, will increase Semglee  2/24- wait to titrate until not NPO anymore.  14. R RTC injury- probably partial tear-   2/16- sustained in [facility]- will write note above HOB to remind staff to protect R shoulder.  15. Extensive B/L DVTs-   2/17- will check CBC this weekend, since recent hx of GI bleed. Don't want pt having PE due to trach and resp failure requiring trach.   2/23- Hb stable- however will con't lovenox until have ot switch to warfarin after surgery 16. Necrotizing fasciitis of LLE  2/21- looks less beefy and deeper red- concerning to me with more slough- called Gen surg, then ortho- they will look at pt, however might need Plastics--   2/22- plastics says pt ready for skin grafts- could do 3/1- but I am concerned pt will need to leave rehab to get surgical intervention- still d/w plastics  2/23- will arrange to go back to acute on 3/1 after surgery   I spent a total of  35  minutes on total care today- >50% coordination of care- due to d/w pt about diet, giving meds via PEG with nursing and d/w PA about NPO status    LOS: 9 days A FACE TO FACE EVALUATION WAS PERFORMED  Windie Marasco 07/24/2021, 10:32 AM

## 2021-07-24 NOTE — Progress Notes (Addendum)
° °  Unable to do DBA stress this am due to BB being given. Will try this pm, scheduled for 3 pm.  Ok to have clear liquids for now, NPO again after noon.  Rosaria Ferries, PA-C 07/24/2021 10:38 AM  Addendum: Performed DBA stress test after reviewing the risks and benefits. DBA up-titrated to 25 mcg/kg/min >> target HR of 131 bpm achieved.  No ST/T wave changes noted.  Occ PVCs seen, no pairs or longer runs.  Pt tolerated well. No chest pain or SOB at max dose.  Pt monitored till HR returned to previous values.  Review of images and final report pending.   Rosaria Ferries, PA-C 07/24/2021 3:21 PM

## 2021-07-24 NOTE — Progress Notes (Signed)
Progress Note  Patient Name: Tony Young Date of Encounter: 07/24/2021  [facility] Cardiologist: Godfrey Pick Tobb, DO   Subjective   No acute events overnight.    DSE today with low risk findings.  Inpatient Medications    Scheduled Meds:  amantadine  50 mg Oral Daily   amiodarone  200 mg Oral BID   vitamin C  500 mg Oral BID   busPIRone  7.5 mg Oral BID   cholecalciferol  1,000 Units Oral Daily   collagenase   Topical Daily   empagliflozin  10 mg Oral Daily   enoxaparin (LOVENOX) injection  120 mg Subcutaneous BID   feeding supplement  237 mL Oral BID BM   ferrous sulfate  300 mg Per Tube BID WC   free water  50 mL Per Tube BID   furosemide  20 mg Oral BID   insulin aspart  0-15 Units Subcutaneous TID WC   insulin aspart  0-5 Units Subcutaneous QHS   insulin glargine-yfgn  24 Units Subcutaneous QHS   latanoprost  1 drop Both Eyes QHS   levothyroxine  75 mcg Oral Q0600   loratadine  10 mg Oral Daily   melatonin  3 mg Oral QHS   multivitamin with minerals  1 tablet Oral Daily   nutrition supplement (JUVEN)  1 packet Per Tube BID BM   pantoprazole sodium  40 mg Per Tube BID   polycarbophil  625 mg Oral BID   predniSONE  10 mg Oral Q breakfast   pregabalin  50 mg Oral Daily   primidone  50 mg Oral Daily   risperiDONE  1 mg Oral BID   sertraline  100 mg Oral QHS   sodium hypochlorite   Topical BID   sorbitol  30 mL Oral Once   traMADol  50 mg Oral Q6H   zinc sulfate  220 mg Oral Daily   Continuous Infusions:  PRN Meds: acetaminophen, alum & mag hydroxide-simeth, bisacodyl, diphenhydrAMINE, guaiFENesin-dextromethorphan, ipratropium-albuterol, naphazoline-glycerin, oxyCODONE, polyethylene glycol, prochlorperazine **OR** prochlorperazine **OR** prochlorperazine, silver nitrate applicators, sodium phosphate, traZODone   Vital Signs    Vitals:   07/24/21 1139 07/24/21 1225 07/24/21 1230 07/24/21 1536  BP:  (!) 123/104  104/76  Pulse: (!) 108 64 100 72   Resp: 18 17  17   Temp:  98 F (36.7 C)  97.9 F (36.6 C)  TempSrc:      SpO2: 99% (!) 82% 95% 100%  Weight:      Height:        Intake/Output Summary (Last 24 hours) at 07/24/2021 1543 Last data filed at 07/24/2021 1412 Gross per 24 hour  Intake 518 ml  Output 950 ml  Net -432 ml    Last 3 Weights 07/24/2021 07/23/2021 07/22/2021  Weight (lbs) 248 lb 14.4 oz 249 lb 248 lb  Weight (kg) 112.9 kg 112.946 kg 112.492 kg      Telemetry    Not on tele   ECG    N/A  Physical Exam   GEN: Ill appearing male in no acute distress.   Neck: Trach in place  Cardiac:  IR IR,  no murmurs, rubs, or gallops.  Respiratory: Clear to auscultation bilaterally. GI: Soft, nontender, non-distended  MS: ACE Wrap in LLE. 2+ RLE edema No deformity. Neuro:  Nonfocal  Psych: Normal affect   Labs   Chemistry Recent Labs  Lab 07/19/21 0723 07/20/21 0555 07/24/21 0248  NA  --  132* 133*  K  --  3.9  4.6  CL  --  96* 100  CO2  --  25 21*  GLUCOSE 431* 336* 173*  BUN  --  19 18  CREATININE  --  1.16 1.47*  CALCIUM  --  8.3* 8.0*  GFRNONAA  --  >60 53*  ANIONGAP  --  11 12      Hematology Recent Labs  Lab 07/18/21 0552 07/20/21 0555 07/24/21 0248  WBC 3.4* 3.5* 4.2  RBC 3.22* 3.12* 3.04*  HGB 9.1* 8.9* 8.6*  HCT 29.0* 27.7* 27.0*  MCV 90.1 88.8 88.8  MCH 28.3 28.5 28.3  MCHC 31.4 32.1 31.9  RDW 16.1* 16.2* 17.2*  PLT PLATELET CLUMPS NOTED ON SMEAR, UNABLE TO ESTIMATE 111* 122*     Radiology    NM Myocar Multi W/Spect W/Wall Motion / EF  Result Date: 07/22/2021   Findings are equivocal.   There is infiltration of the IV with extravasation of the myoview into the patients arm during the stress images.   The study in uninterpretable .    Cardiac Studies   Echo 07/22/21 1. Left ventricular ejection fraction, by estimation, is 40 to 45%. The  left ventricle has mildly decreased function. Left ventricular endocardial  border not optimally defined to evaluate regional wall  motion. There is  mild left ventricular hypertrophy.   Left ventricular diastolic parameters are indeterminate.   2. Right ventricular systolic function is normal. The right ventricular  size is normal.   3. The mitral valve is normal in structure. No evidence of mitral valve  regurgitation. No evidence of mitral stenosis.   4. The aortic valve is calcified. There is mild calcification of the  aortic valve. There is mild thickening of the aortic valve. Aortic valve  regurgitation is not visualized. Aortic valve sclerosis is present, with  no evidence of aortic valve stenosis.   5. The inferior vena cava is normal in size with greater than 50%  respiratory variability, suggesting right atrial pressure of 3 mmHg.     DSE 07/24/21:  1. This is a negative stress echocardiogram for ischemia.   2. This is a low risk study.   Patient Profile     66 y.o. male history of COPD, OSA, CAD, CHF, type 2 diabetes and atrial fibrillation here after complicated hospitalization including necrotizing fasciitis of his left lower extremity and developed acute respiratory failure, multiorgan dysfunction, GI bleed, shock liver, and ultimately underwent tracheostomy placement.  The patient is now being considered for skin grafting and Cardiology is consulted for preoperative CV risk assessment.  Assessment & Plan    PAF - not on tele. Continue amio and BB. On Lovenox.   2. 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  - Jardiance - Continue metoprolol  - Continue lasix 20mg  BID   3. Pre-op clearance - Echo with mildly reduce LVEF and Dobutamine stress echo without high risk findings.  Patient is at low risk for perioperative cardiovascular complications.  Will sign off, call with ?s.  For questions or updates, please contact [facility] Please consult [email or web address] for contact info under

## 2021-07-24 NOTE — Progress Notes (Signed)
2025: While getting routine vitals it was noted that pt HR was ranging between 120-150s. Pt had not received nightly medication at this time. Informed charge nurse to come assess pt for the fluctuation in high HR. Patient is noted to have Afib.  Pt was given nightly medication, and vital signs rechecked manual apical pulse was obtained by Charge nurse noted at 102.    2345: patient was discontinued from tube feed, and made NPO for procedure in the morning. Pt stated he hadn't been feeling well, with slight confusion A/O to self, disoriented to time, situation, and place, but easily reoriented. Vital signs were obtained. Pt noted to have slight low grade temp. Charge nurse george made aware, and vitals rechecked(noted in chart @0006 ). PA Reesa Chew made aware, new orders placed for CBC, and BMP @0500 .   Dayna Ramus

## 2021-07-25 DIAGNOSIS — D696 Thrombocytopenia, unspecified: Secondary | ICD-10-CM | POA: Diagnosis not present

## 2021-07-25 DIAGNOSIS — E1165 Type 2 diabetes mellitus with hyperglycemia: Secondary | ICD-10-CM

## 2021-07-25 DIAGNOSIS — G7281 Critical illness myopathy: Secondary | ICD-10-CM | POA: Diagnosis not present

## 2021-07-25 DIAGNOSIS — Z794 Long term (current) use of insulin: Secondary | ICD-10-CM

## 2021-07-25 DIAGNOSIS — I5042 Chronic combined systolic (congestive) and diastolic (congestive) heart failure: Secondary | ICD-10-CM | POA: Diagnosis not present

## 2021-07-25 LAB — GLUCOSE, CAPILLARY
Glucose-Capillary: 123 mg/dL — ABNORMAL HIGH (ref 70–99)
Glucose-Capillary: 224 mg/dL — ABNORMAL HIGH (ref 70–99)
Glucose-Capillary: 217 mg/dL — ABNORMAL HIGH (ref 70–99)
Glucose-Capillary: 234 mg/dL — ABNORMAL HIGH (ref 70–99)

## 2021-07-25 NOTE — Progress Notes (Signed)
PROGRESS NOTE   Subjective/Complaints: Patient seen sitting up in bed this morning.  He states he did not sleep well overnight because he cannot sleep in the hospital.  He denies complaints.  He was seen by cardiology yesterday, notes reviewed-unable to do stress test due to patient being on beta-blocker.  Seen by cardiology again-continue medications.  ROS:  Pt denies SOB, abd pain, CP, N/V/C/D, and vision changes      Objective:   ECHOCARDIOGRAM STRESS TEST  Result Date: 07/24/2021     DOBUTAMINE STRESS ECHO REPORT    -------------------------------------------------------------------------------- Patient Name:   Tony Young [facility] Date of Exam: 07/24/2021 Medical Rec #:  [id number]              Height:       67.0 in Accession #:    [id number]             Weight:       248.9 lb Date of Birth:  27-Dec-1955              BSA:          2.219 m Patient Age:    66 years               BP:           103/63 mmHg Patient Gender: M                      HR:           109 bpm. Exam Location:  Inpatient Procedure: Dobutamine Stress Indications:    pre-operative evaluation  History:        Patient has prior history of Echocardiogram examinations, most                 recent 07/22/2021.  Sonographer:    Johny Chess RDCS Referring Phys: [id number] [place]  1. This is a negative stress echocardiogram for ischemia.  2. This is a low risk study. FINDINGS Exam Protocol: Definity contrast agent was given IV to delineate the left ventricular endocardial borders.  Patient Performance: The baseline heart rate was 93 bpm. The heart rate at peak stress was 131 bpm. The target heart rate was calculated to be 131 bpm. The percentage of maximum predicted heart rate achieved was 84.9 %. The baseline blood pressure was 103/63 mmHg. The blood pressure at peak stress was 103/63 mmHg. The patient developed no symptoms during the stress exam.  EKG:  Resting EKG showed atrial fibrillation. The patient developed no abnormal EKG findings during exercise.  2D Echo Findings: The baseline ejection fraction was 60%. The peak ejection fraction at stress was 80%. Baseline regional wall motion abnormalities were not present. There were no stress-induced wall motion abnormalities. This is a negative stress echocardiogram for ischemia.  Candee Furbish MD Electronically signed on 07/24/2021 at 3:44:31 PM    Final    Recent Labs    07/24/21 0248  WBC 4.2  HGB 8.6*  HCT 27.0*  PLT 122*     Recent Labs    07/24/21 0248  NA 133*  K 4.6  CL 100  CO2 21*  GLUCOSE  173*  BUN 18  CREATININE 1.47*  CALCIUM 8.0*      Intake/Output Summary (Last 24 hours) at 07/25/2021 1123 Last data filed at 07/25/2021 3716 Gross per 24 hour  Intake 518 ml  Output 901 ml  Net -383 ml      Pressure Injury 07/15/21 Buttocks Left Unstageable - Full thickness tissue loss in which the base of the injury is covered by slough (yellow, tan, gray, green or brown) and/or eschar (tan, brown or black) in the wound bed. (Active)  07/15/21 1520  Location: Buttocks  Location Orientation: Left  Staging: Unstageable - Full thickness tissue loss in which the base of the injury is covered by slough (yellow, tan, gray, green or brown) and/or eschar (tan, brown or black) in the wound bed.  Wound Description (Comments):   Present on Admission: Yes    Physical Exam: Vital Signs Blood pressure 116/70, pulse (!) 102, temperature 98.8 F (37.1 C), temperature source Oral, resp. rate 18, height 5\' 7"  (1.702 m), weight 112.9 kg, SpO2 97 %. General: awake, alert, appropriate, obese HENT: conjugate gaze; oropharynx dry-+ trach with PMV_ off O2 CV: regular rhythm.  Borderline tachycardia, no JVD Pulmonary: CTA B/L; no W/R/R- good air movement GI: firm- not hard- distended; NT; hypoactive BS Psychiatric: appropriate Neurological: Alert Mild dysarthria RUE weakness    Assessment/Plan: 1. Functional deficits which require 3+ hours per day of interdisciplinary therapy in a comprehensive inpatient rehab setting. Physiatrist is providing close team supervision and 24 hour management of active medical problems listed below. Physiatrist and rehab team continue to assess barriers to discharge/monitor patient progress toward functional and medical goals  Care Tool:  Bathing    Body parts bathed by patient: Face   Body parts bathed by helper: Right arm, Left arm, Chest, Abdomen, Front perineal area, Buttocks, Right upper leg, Left upper leg, Right lower leg, Left lower leg, Face Body parts n/a: Left lower leg   Bathing assist Assist Level: 2 Helpers     Upper Body Dressing/Undressing Upper body dressing   What is the patient wearing?: Hospital gown only    Upper body assist Assist Level: Total Assistance - Patient < 25%    Lower Body Dressing/Undressing Lower body dressing      What is the patient wearing?: Pants, Underwear/pull up     Lower body assist Assist for lower body dressing: 2 Helpers     Toileting Toileting    Toileting assist Assist for toileting: 2 Helpers     Transfers Chair/bed transfer  Transfers assist  Chair/bed transfer activity did not occur: Safety/medical concerns        Locomotion Ambulation   Ambulation assist   Ambulation activity did not occur: Safety/medical concerns          Walk 10 feet activity   Assist  Walk 10 feet activity did not occur: Safety/medical concerns        Walk 50 feet activity   Assist Walk 50 feet with 2 turns activity did not occur: Safety/medical concerns         Walk 150 feet activity   Assist Walk 150 feet activity did not occur: Safety/medical concerns         Walk 10 feet on uneven surface  activity   Assist Walk 10 feet on uneven surfaces activity did not occur: Safety/medical concerns         Wheelchair     Assist Is the patient  using a wheelchair?: Yes Type of Wheelchair: Manual Wheelchair  activity did not occur: Safety/medical concerns         Wheelchair 50 feet with 2 turns activity    Assist    Wheelchair 50 feet with 2 turns activity did not occur: Safety/medical concerns       Wheelchair 150 feet activity     Assist  Wheelchair 150 feet activity did not occur: Safety/medical concerns       Blood pressure 116/70, pulse (!) 102, temperature 98.8 F (37.1 C), temperature source Oral, resp. rate 18, height 5\' 7"  (1.702 m), weight 112.9 kg, SpO2 97 %.  Medical Problem List and Plan: 1. 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   2.  Antithrombotics: -DVT/anticoagulation:  Mechanical: Sequential compression devices, below knee Right lower extremity --high risk for DVTs due to body habitus and immobility. Will check dopplers in am. 2/17- pt has extensive DVTs in RLE and some in LLE- as above- placed on tx dose lovenox   2/20- Plts low 100s, however Hb stable- con't regimen- cannot place on DOAC_ will need Warfarin when change over per pharmacy 2/22- wait to change due to surgical intervention planned.  2/23- d/w Plastic-s will cont lovenox until 1-2 days before surgery then change to heparin drip- will need to go back to acute for skin graft on 3/1             -antiplatelet therapy: N/A 3. Pain: continue oxycodone prn. 2/16- main pain is R shoulder- con't regimen prn 2/22- L knee looks like swelling/effusion- however likely a strain- con't oxy prnand added tramadol TID  Controlled on with meds 2/25 4. Mood: LCSW to follow for evaluation and support.              -antipsychotic agents:  Risperdal bid 5. Neuropsych: This patient is capable of making decisions on his own behalf. 6. Skin/Wound Care: Air mattress  due to sacral decub and necrotizing fasciitis LLE-              --continue vitamins and supplements to promote wound healing.  7. Fluids/Electrolytes/Nutrition:  Strict I/O as continues to show signs of overload/anasarca.             --low salt diet. Continue Juven to help with protein stores.  2/22- will d/w nutrition if needs Tfs anymore- eating meals?- calorie count being finished today.   2/24- hard ot figure out nutrition since been NPO for 3 days now 8. Strep pyogenes bacteremia/Necrotizing fascitis LLE s/p I & D: Continue daily dressing changes.              --maintain adequate nutritional status to help promote healing. 9. Acute  reps failure s/p trach- and COPD: Respiratory status stable. Continue steriod taper  2/16- con't PMV and SLP for swallowing/speech/phonation  2/20- talking with Resp therapy if can downsize trach- I think we likely can to #4, however will need to discuss directly with Resp therapy.  10 OSA/OHS?/VDRF: Continue button plugging during the day and ATC at nights for humidification             --Started discussion that he will likely go home with trach.  11. H/o GIB: H/H improving. Continue PPI BID.              --no signs of bleeding. Will start low  dose Lovenox if platelets stable.   Monitor CBC on anticoagulation  CBC Latest Ref Rng & Units 07/24/2021 07/20/2021 07/18/2021  WBC 4.0 - 10.5 K/uL 4.2 3.5(L) 3.4(L)  Hemoglobin 13.0 - 17.0 g/dL 8.6(L) 8.9(L) 9.1(L)  Hematocrit 39.0 - 52.0 % 27.0(L) 27.7(L) 29.0(L)  Platelets 150 - 400 K/uL 122(L) 111(L) PLATELET CLUMPS NOTED ON SMEAR, UNABLE TO ESTIMATE    2/24- Hb stable- con't regimen 12. Thrombocytopenia: Platelets continue to fluctuate.   2/16- Plts up to 166k  2/20- Plts down to 111k- will recheck Thursday 13.  T2DM with hyperglycemia:  Check Hgb A1c in am. Will monitor BS ac/hs with SSI for elevated BS             Add 2U Semglee HS             --continue tube feeds at nights (likely contributing to diarrhea)      2/17- Pt CBGs are running 200sto300s- will increase Semglee to 6 units nightly- from 2 units and monitor for trend CBG (last 3)  Recent Labs    07/24/21 1620 07/24/21 2124 07/25/21 0542  GLUCAP 102* 163* 123*   Increased semglee to 20U change SSI to Moderate   2/20- just got increased yesterday- wait to change meds for 24 hours- in midst of calorie count to see if can stop TF's.   2/21- will increase semglee to 24 units and maintain SSI- since CBGs are in 200s-300s-   2/22- pt NPO earlier today so CBGs looked good- however back up to 289 this evening- if still up in AM, will increase Semglee  2/24- wait to titrate until not NPO anymore.   2/25-relatively controlled 14. R RTC injury- probably partial tear-   2/16- sustained in [facility]- will write note above HOB to remind staff to protect R shoulder.  15. Extensive B/L DVTs-   2/17- will check CBC this weekend, since recent hx of GI bleed. Don't want pt having PE due to trach and resp failure requiring trach.   2/23- Hb stable- however will con't lovenox until have ot switch to warfarin after surgery 16. Necrotizing fasciitis of LLE  2/21- looks less beefy and deeper red- concerning to me with more slough- called Gen surg, then ortho- they will look at pt, however might need Plastics--   2/22- plastics says pt ready for skin grafts- could do 3/1- but I am concerned pt will need to leave rehab to get surgical intervention- still d/w plastics  2/23- will arrange to go back to acute on 3/1 after surgery 17.  Thrombocytopenia  Platelets 122 on 2/24 18.  Chronic combined CHF  EF of 40-45%, continue meds  Appreciate cards recs  LOS: 10 days A FACE TO FACE EVALUATION WAS PERFORMED  Terisha Losasso Lorie Phenix 07/25/2021, 11:23 AM

## 2021-07-25 NOTE — Progress Notes (Signed)
Physical Therapy Wound Treatment Patient Details  Name: Tony Young MRN: [id number] Date of Birth: 1956/05/04  Today's Date: 07/25/2021 PT Individual Time: 1040-1125 PT Individual Time Calculation (min): 45 min   Subjective  Subjective: Pt agreeable to hydrotherapy Patient and Family Stated Goals: Heal wound Date of Onset:  (Unknown) Prior Treatments: Dressing changes  Pain Score:  2/10  Wound Assessment  Pressure Injury 07/15/21 Buttocks Left Unstageable - Full thickness tissue loss in which the base of the injury is covered by slough (yellow, tan, gray, green or brown) and/or eschar (tan, brown or black) in the wound bed. (Active)  Dressing Type Foam - Lift dressing to assess site every shift;Gauze (Comment);Barrier Film (skin prep);Moist to moist;Dakin's-soaked gauze 07/25/21 1633  Dressing Clean, Dry, Intact;Changed 07/25/21 1633  Dressing Change Frequency Daily 07/25/21 1633  State of Healing Eschar 07/25/21 1633  Site / Wound Assessment Pink;Yellow;Red;Bleeding;Painful;Black 07/25/21 1633  % Wound base Red or Granulating 5% 07/25/21 1633  % Wound base Yellow/Fibrinous Exudate 75% 07/25/21 1633  % Wound base Black/Eschar 20% 07/25/21 1633  % Wound base Other/Granulation Tissue (Comment) 0% 07/25/21 1633  Peri-wound Assessment Intact 07/25/21 1633  Wound Length (cm) 7.2 cm 07/17/21 1518  Wound Width (cm) 7 cm 07/17/21 1518  Wound Depth (cm) 0.2 cm 07/17/21 1518  Wound Surface Area (cm^2) 50.4 cm^2 07/17/21 1518  Wound Volume (cm^3) 10.08 cm^3 07/17/21 1518  Tunneling (cm) 0 07/17/21 1518  Undermining (cm) 0 07/17/21 1518  Margins Unattached edges (unapproximated) 07/25/21 1633  Drainage Amount Moderate 07/25/21 1633  Drainage Description Serosanguineous 07/25/21 1633  Treatment Debridement (Selective);Hydrotherapy (Pulse lavage);Other (Comment) 07/25/21 1633      Hydrotherapy Pulsed lavage therapy - wound location: L buttock Pulsed Lavage with Suction (psi): 12  psi Pulsed Lavage with Suction - Normal Saline Used: 1000 mL Pulsed Lavage Tip: Tip with splash shield Selective Debridement Selective Debridement - Location: L buttock Selective Debridement - Tools Used: Forceps;Scalpel Selective Debridement - Tissue Removed: yellow eschar, slough   Wound Assessment and Plan  Wound Therapy - Assess/Plan/Recommendations Wound Therapy - Clinical Statement: Wound continues with foul odor and increased black necrotic tissue. Day #1 of Dakin's initiated today. Wound Therapy - Functional Problem List: Immobility Factors Delaying/Impairing Wound Healing: Diabetes Mellitus;Immobility;Multiple medical problems;Polypharmacy Hydrotherapy Plan: Debridement;Dressing change;Patient/family education;Pulsatile lavage with suction Wound Therapy - Frequency: 6X / week Wound Therapy - Follow Up Recommendations: dressing changes by RN  Wound Therapy Goals- Improve the function of patient's integumentary system by progressing the wound(s) through the phases of wound healing (inflammation - proliferation - remodeling) by: Decrease Necrotic Tissue to: 20% Decrease Necrotic Tissue - Progress: Progressing toward goal Increase Granulation Tissue to: 80% Increase Granulation Tissue - Progress: Progressing toward goal Goals/treatment plan/discharge plan were made with and agreed upon by patient/family: Yes Time For Goal Achievement: 7 days Wound Therapy - Potential for Goals: Good  Goals will be updated until maximal potential achieved or discharge criteria met.  Discharge criteria: when goals achieved, discharge from hospital, MD decision/surgical intervention, no progress towards goals, refusal/missing three consecutive treatments without notification or medical reason.  GP    Wyona Almas, PT, DPT Acute Rehabilitation Services Pager [phone] Office [phone]   Deno Etienne 07/25/2021, 4:37 PM

## 2021-07-25 NOTE — Progress Notes (Signed)
Speech Language Pathology Daily Session Note  Patient Details  Name: Severus Brodzinski MRN: [id number] Date of Birth: 02/13/1956  Today's Date: 07/25/2021 SLP Missed Time: 66 Minutes Missed Time Reason: Patient unwilling to participate  Short Term Goals: Week 2: SLP Short Term Goal 1 (Week 2): Pt will consumed thin liquids (cup or straw) with minimal overt s/s aspiration and supervision a verbal cues for use of small sips. SLP Short Term Goal 2 (Week 2): Patient will consume dys 3 trials with effective mastication, minimal oral residuals, and without overt s/sx of aspiration with min A verbal cues to implement safe swallow precautions and strategies prior to diet advancement SLP Short Term Goal 3 (Week 2): Patient will utilize external/internal memory aids to recall important information with minA.  Skilled Therapeutic Interventions: SLP attempted to provide treatement on two different occassions, but pt refused ST services due to reporting " not feeling too well. I need to sleep." SLP checked vital on monitor all WFL. SLP provided education pertaining to repeat refusal can result in d/cing services, pt stated he understood. Pt missed 45 minutes of skilled ST services.     Pain Pain Assessment Pain Scale: 0-10 Pain Score: 0-No pain  Therapy/Group: Individual Therapy  Artrice Kraker  [facility] 07/25/2021, 9:56 AM

## 2021-07-25 NOTE — Progress Notes (Signed)
ANTICOAGULATION CONSULT NOTE - Follow-up Consult  Pharmacy Consult for Lovenox Indication: DVT  Allergies  Allergen Reactions   Statins Other (See Comments)    Made hands swell   Penicillins Nausea And Vomiting and Other (See Comments)    Other reaction(s): GI intolerance Other reaction(s): Nausea And Vomiting, Other (See Comments), Other (see comments) Other reaction(s): Other (See Comments) Other reaction(s): Nausea And Vomiting, Other (See Comments), Other (see comments) Other reaction(s): Nausea And Vomiting, Other (See Comments), Other (see comments)    Ropinirole Other (See Comments)    Patient Measurements: Height: 5\' 7"  (170.2 cm) Weight: 112.9 kg (248 lb 14.4 oz) IBW/kg (Calculated) : 66.1  Vital Signs: Temp: 98.8 F (37.1 C) (02/25 0504) Temp Source: Oral (02/25 0504) BP: 116/70 (02/25 0504) Pulse Rate: 102 (02/25 0504)  Labs: Recent Labs    07/24/21 0248  HGB 8.6*  HCT 27.0*  PLT 122*  CREATININE 1.47*     Estimated Creatinine Clearance: 60.1 mL/min (A) (by C-G formula based on SCr of 1.47 mg/dL (H)).   Medical History: Past Medical History:  Diagnosis Date   CAD (coronary artery disease)    Cardiomyopathy ([place])    Colon cancer (HCC)    COPD (chronic obstructive pulmonary disease) (HCC)    OSA (obstructive sleep apnea)    Type 2 diabetes mellitus ([place])     Assessment: Tony Young with complicated medical hx who was recently a [facility] before [facility]. He was then admitted to CIR. He had some recent hx of GIB. Dopplers showed DVT. Lovenox continued for anticoagulation.   Hgb 8.6 and PLT 122, stable on last CBC (2/24). SCr has increased from 1.16 to 1.47. However, dosing remains appropriate.   Goal of Therapy:  Monitor platelets by anticoagulation protocol: Yes   Plan:  Continue Lovenox 120mg  SQ BID Weekly CBC (next due 2/27) Monitor signs and symptoms of bleeding, renal function F/u oral AC plan  Thank you for  involving pharmacy in this patient's care.  Joseph Art, Pharm.D. PGY-1 Pharmacy Resident [phone] 07/25/2021 7:24 AM

## 2021-07-26 LAB — GLUCOSE, CAPILLARY
Glucose-Capillary: 133 mg/dL — ABNORMAL HIGH (ref 70–99)
Glucose-Capillary: 155 mg/dL — ABNORMAL HIGH (ref 70–99)
Glucose-Capillary: 178 mg/dL — ABNORMAL HIGH (ref 70–99)
Glucose-Capillary: 158 mg/dL — ABNORMAL HIGH (ref 70–99)

## 2021-07-26 NOTE — Progress Notes (Signed)
Physical Therapy Session Note  Patient Details  Name: Tony Young MRN: [id number] Date of Birth: 01/03/1956  Today's Date: 07/26/2021 PT Individual Time: 1106-1159 PT Individual Time Calculation (min): 53 min   Short Term Goals: Week 1:  PT Short Term Goal 1 (Week 1): Pt will complete bed mobility with maxA of 1 person PT Short Term Goal 2 (Week 1): Pt will complete bed<>chair transfer with maxA +2 PT Short Term Goal 3 (Week 1): Pt will complete sit<>stand transfer with maxA +2 PT Short Term Goal 4 (Week 1): Pt will tolerate sitting in TIS w/c for >2 hrs outside of therapy  Skilled Therapeutic Interventions/Progress Updates:    Pt received supine in bed and with min encouragement agreeable to therapy session. Pt with continuous HR and SpO2 monitor - SpO2 WNL during session and HR noted to be consistently elevated >90bpms throughout session. Pt already with ACE wrap around L knee for pain management. Performed B LE active assisted hip/knee flexion (aka modified heel slides to avoid shearing forces on heels) x10 reps each - pt continues to have limited knee flexion bilaterally (L>R) due to pain. Supine>L sidelying>sitting EOB with +2 mod assist for B LE management and bringing trunk upright with cuing for sequencing - slight improvement in this compared to 3 days prior. Pt tolerated sitting EOB ~10-51minutes working on trunk control and midline orientation - requires constant CGA with up to light mod assist to maintain upright - pt tends to have R trunk lean with poor midline orientation as pt tends to resist when therapist facilitates L lean to achieve midline, provided visual feedback and educated pt on this. Seated EOB, attempted to perform B UE reaching task; however, pt with minimal participation in this, reporting he is too weak in his UEs. Transitioned to seated B LE long arch quads x15 reps each LE with active assist on L side - cuing for slower movements and isometric hold at the top  for increased quad activation as pt tends ot move quickly with momentum. Pt requesting to return to bed as he is tired. Sit>supine via reverse logroll technique with +2 mod assist for trunk descent and BLE management onto bed. Pt agreeable to being repositioned in R sidelying for L buttock wound pressure relief - +2 mod assist for rolling using bedrails. Pt therapeutically positioned with pillows for pressure relief and support of extremities. Therapist provided pt with new elbow pads and notified nurse so she could don them after performing pt's skin assessment. Pt left R sidelying in bed with soft call button in hand, lines intact, and quickly falling asleep.  Therapy Documentation Precautions:  Precautions Precautions: Fall Precaution Comments: trach, PEG, unable to read, fragile skin Restrictions Weight Bearing Restrictions: No   Pain: Continues to report L buttock pain - pt agreeable to reposition into R sidelying at end of session for pain management.   Therapy/Group: Individual Therapy  Tawana Scale , PT, DPT, NCS, CSRS  07/26/2021, 7:57 AM

## 2021-07-27 DIAGNOSIS — G7281 Critical illness myopathy: Secondary | ICD-10-CM | POA: Diagnosis not present

## 2021-07-27 LAB — BASIC METABOLIC PANEL
Anion gap: 10 (ref 5–15)
BUN: 17 mg/dL (ref 8–23)
CO2: 26 mmol/L (ref 22–32)
Calcium: 8.2 mg/dL — ABNORMAL LOW (ref 8.9–10.3)
Chloride: 99 mmol/L (ref 98–111)
Creatinine, Ser: 1.31 mg/dL — ABNORMAL HIGH (ref 0.61–1.24)
GFR, Estimated: >60 mL/min (ref >60)
Glucose, Bld: 95 mg/dL (ref 70–99)
Potassium: 3.1 mmol/L — ABNORMAL LOW (ref 3.5–5.1)
Sodium: 135 mmol/L (ref 135–145)

## 2021-07-27 LAB — CBC WITH DIFFERENTIAL/PLATELET
Abs Immature Granulocytes: 0.1 10*3/uL — ABNORMAL HIGH (ref 0.00–0.07)
Basophils Absolute: 0.1 10*3/uL (ref 0.0–0.1)
Basophils Relative: 3 %
Eosinophils Absolute: 0.1 10*3/uL (ref 0.0–0.5)
Eosinophils Relative: 2 %
HCT: 28.7 % — ABNORMAL LOW (ref 39.0–52.0)
Hemoglobin: 8.8 g/dL — ABNORMAL LOW (ref 13.0–17.0)
Lymphocytes Relative: 20 %
Lymphs Abs: 0.6 10*3/uL — ABNORMAL LOW (ref 0.7–4.0)
MCH: 27.5 pg (ref 26.0–34.0)
MCHC: 30.7 g/dL (ref 30.0–36.0)
MCV: 89.7 fL (ref 80.0–100.0)
Metamyelocytes Relative: 2 %
Monocytes Absolute: 0.3 10*3/uL (ref 0.1–1.0)
Monocytes Relative: 10 %
Neutro Abs: 1.8 10*3/uL (ref 1.7–7.7)
Neutrophils Relative %: 63 %
Platelets: 136 10*3/uL — ABNORMAL LOW (ref 150–400)
RBC: 3.2 MIL/uL — ABNORMAL LOW (ref 4.22–5.81)
RDW: 17 % — ABNORMAL HIGH (ref 11.5–15.5)
WBC: 2.9 10*3/uL — ABNORMAL LOW (ref 4.0–10.5)
nRBC: 1 /100 WBC — ABNORMAL HIGH
nRBC: 1.4 % — ABNORMAL HIGH (ref 0.0–0.2)

## 2021-07-27 LAB — GLUCOSE, CAPILLARY
Glucose-Capillary: 100 mg/dL — ABNORMAL HIGH (ref 70–99)
Glucose-Capillary: 132 mg/dL — ABNORMAL HIGH (ref 70–99)
Glucose-Capillary: 238 mg/dL — ABNORMAL HIGH (ref 70–99)
Glucose-Capillary: 246 mg/dL — ABNORMAL HIGH (ref 70–99)

## 2021-07-27 MED ORDER — HEPARIN (PORCINE) 25000 UT/250ML-% IV SOLN
1500.0000 [IU]/h | INTRAVENOUS | Status: DC
  Administered 2021-07-27 – 2021-07-29 (×3): 1500 [IU]/h via INTRAVENOUS
  Filled 2021-07-27 (×3): qty 250

## 2021-07-27 MED ORDER — POTASSIUM CHLORIDE CRYS ER 20 MEQ PO TBCR
40.0000 meq | EXTENDED_RELEASE_TABLET | Freq: Two times a day (BID) | ORAL | Status: AC
  Administered 2021-07-27 – 2021-07-28 (×2): 40 meq via ORAL
  Filled 2021-07-27 (×2): qty 2

## 2021-07-27 MED ORDER — DAKINS (1/4 STRENGTH) 0.125 % EX SOLN
Freq: Two times a day (BID) | CUTANEOUS | Status: DC
  Filled 2021-07-27: qty 473

## 2021-07-27 NOTE — Consult Note (Signed)
NAME:  Tony Young, MRN:  [id number], DOB:  July 28, 1955, LOS: 12 ADMISSION DATE:  07/15/2021, CONSULTATION DATE:  2/21 REFERRING MD:  Dagoberto Ligas, CHIEF COMPLAINT:  trach management    History of Present Illness:  This is a 66 year old male patient with history as mentioned below who was admitted to [facility] rehab center on 2/15 after a prolonged hospital stay due to necrotizing fasciitis involving the left lower extremity with associated strep pyogenes bacteremia.  His hospital stay was long and complicated and complicated by septic shock, requiring multiple incision and drainage of the left lower extremity on 12/15, 12/16, and 12/24 hours respiratory failure, renal failure, requiring CRRT, GI bleed from gastric ulcer requiring several blood transfusions, non-STEMI, volume overload, encephalopathy, shock liver, and ultimately tracheostomy to facilitate ventilator weaning and PEG placement both placed December 27.  He was eventually transferred to [facility] for ongoing ventilator weaning that stay was also complicated by ventilator associated pneumonia, COPD exacerbation, and atrial fibrillation with RVR.  He ultimately however improved to the point where he was able to tolerate aerosol trach collar by 1/25 2023 but required occasional diuresis for volume overload.  Over the course of his hospitalization he had extensive deconditioning from his prolonged critical illness, and was ultimately transferred to inpatient rehab after being successfully weaned off the ventilator. From a rehab standpoint he is making slow progress.  Continues to have dependent edema, left lower extremity open wound, in need of eventual skin graft, and fairly significant functional deficits requiring over 3 hours a day of physical therapy.  Since his admission to rehab his course has been complicated by bilateral lower extremity deep vein thrombosis for which she is now on therapeutic low molecular weight heparin.   Pulmonary has been asked to see in regards to recommendations for tracheostomy Pertinent  Medical History  OSA, COPD, CAD, CHF, colon cancer, T2DM, atrial fib, prior obstructive sleep apnea on CPAP    Significant Hospital Events: Including procedures, antibiotic start and stop dates in addition to other pertinent events   2/15 admitted to rehab 2/16 ultrasound lower extremities: Positive DVT right lower extremity, positive DVT left lower extremity.  Therapeutic low molecular weight heparin started.  Seen by cardiology for consultation to assist with treatment of atrial fibrillation 2/21 Pulmonary consulted for trach management  Interim History / Subjective:  Resting comfortably in bed, he does have lower extremity leg discomfort he is profoundly weak  Objective   Blood pressure 101/73, pulse (!) 117, temperature 97.9 F (36.6 C), resp. rate 18, height 5\' 7"  (1.702 m), weight 112.9 kg, SpO2 96 %.    FiO2 (%):  [21 %] 21 %   Intake/Output Summary (Last 24 hours) at 07/27/2021 1138 Last data filed at 07/27/2021 0950 Gross per 24 hour  Intake 1080 ml  Output 2100 ml  Net -1020 ml    Filed Weights   07/22/21 0500 07/23/21 0401 07/24/21 0306  Weight: 112.5 kg 112.9 kg 112.9 kg    Examination: General: Obese and debilitated 66 year old male who appears much older than stated age he is currently in no acute distress and on room air, lying flat in bed HENT: Normocephalic atraumatic neck is large and difficult to assess for JVD he has a size 6 tracheostomy in place PMV is also in place his phonation quality is strong Lungs: Diminished bilaterally no accessory use currently room air Cardiovascular: Regular irregular Abdomen: Large soft no organomegaly Neuro: Awake oriented no focal deficits  Resolved Hospital Problem list  Assessment & Plan:  Tracheostomy dependence Chronic respiratory failure Obstructive sleep apnea Obesity hypoventilation syndrome History of tobacco abuse and  COPD Severe physical deconditioning Obesity Atrial fibrillation Bilateral lower extremity DVT Diabetes type 2, insulin-dependent, with hyperglycemia Anemia of critical illness Recent necrotizing fasciitis involving the left lower extremity status post multiple I&D.  Essentially has open Circumferential tissue loss, also unstageable pressure ulcer on the sacrum and bilateral upper gluteal muscle Protein calorie malnutrition   Pulmonary problem list: Tracheostomy dependence following prolonged critical illness Obstructive sleep apnea/obesity hypoventilation syndrome COPD Tobacco abuse Severe physical deconditioning  Discussion from prior provider: Mr. Young is status post a very prolonged critical illness, superimposed on fairly extensive underlying comorbidities.  He has a history of obstructive sleep apnea, and had been on CPAP in the past however due to more recent weight gain even found this intolerable, and not meeting his needs therefore he had not been using it.  He has had at least 2 exacerbations of his underlying lung disease in addition to hospital-acquired pneumonias during his last 2 acute stays including his initial hospitalization [facility].  From a pulmonary standpoint however he has at least been weaned off the ventilator, and seems to be making progress.  That being said I do not think he is a candidate for immediate decannulation, and as he and I discussed his long as he has tracheostomy in place his sleep apnea is treated.  His response to this was he would like to "just keep the trach", which I think is a wise decision given his intolerance of CPAP in the past.  I do not see a role for downsizing the trach as he currently phonates well, and is tolerating his current diet so there would be no benefit to a smaller airway, in fact should he have mucous plugging in the future I would consider a smaller airway more of a risk than  benefit.   Plan/rec Continue routine trach care Encourage daytime Passy-Muir valve as much as tolerated No role for downsizing tracheostomy in this setting risk would outweigh benefit Continue bronchodilators for his COPD Continue to work on rehabilitation  We will continue to follow weekly while here in rehab unit, No recommendation for decannulation unless attitude towards CPAP changes  Best Practice (right click and "Reselect all SmartList Selections" daily)   Per primary   Labs   CBC: Recent Labs  Lab 07/24/21 0248 07/27/21 0722  WBC 4.2 2.9*  NEUTROABS 2.4 1.8  HGB 8.6* 8.8*  HCT 27.0* 28.7*  MCV 88.8 89.7  PLT 122* 136*     Basic Metabolic Panel: Recent Labs  Lab 07/24/21 0248 07/27/21 0722  NA 133* 135  K 4.6 3.1*  CL 100 99  CO2 21* 26  GLUCOSE 173* 95  BUN 18 17  CREATININE 1.47* 1.31*  CALCIUM 8.0* 8.2*    GFR: Estimated Creatinine Clearance: 67.4 mL/min (A) (by C-G formula based on SCr of 1.31 mg/dL (H)). Recent Labs  Lab 07/24/21 0248 07/27/21 0722  WBC 4.2 2.9*     Liver Function Tests: No results for input(s): AST, ALT, ALKPHOS, BILITOT, PROT, ALBUMIN in the last 168 hours.  No results for input(s): LIPASE, AMYLASE in the last 168 hours. No results for input(s): AMMONIA in the last 168 hours.  ABG    Component Value Date/Time   PHART 7.482 (H) 07/03/2021 0950   PCO2ART 33.6 07/03/2021 0950   PO2ART 56.3 (L) 07/03/2021 0950   HCO3 25.0 07/03/2021  0950   ACIDBASEDEF 1.3 06/25/2021 1252   O2SAT 91.2 07/03/2021 0950      Coagulation Profile: No results for input(s): INR, PROTIME in the last 168 hours.  Cardiac Enzymes: No results for input(s): CKTOTAL, CKMB, CKMBINDEX, TROPONINI in the last 168 hours.  HbA1C: Hgb A1c MFr Bld  Date/Time Value Ref Range Status  07/16/2021 06:12 AM 5.9 (H) 4.8 - 5.6 % Final    Comment:    (NOTE) Pre diabetes:          5.7%-6.4%  Diabetes:              >6.4%  Glycemic control for    <7.0% adults with diabetes     CBG: Recent Labs  Lab 07/26/21 0605 07/26/21 1156 07/26/21 1630 07/26/21 2213 07/27/21 0637  GLUCAP 133* 155* 178* 158* 100*     Review of Systems:   N/a   Past Medical History:  He,  has a past medical history of CAD (coronary artery disease), Cardiomyopathy ([place]), Colon cancer ([place]), COPD (chronic obstructive pulmonary disease) ([place]), OSA (obstructive sleep apnea), and Type 2 diabetes mellitus ([place]).   Surgical History:   Past Surgical History:  Procedure Laterality Date   COLON RESECTION     IR REMOVAL TUN CV CATH W/O FL  07/01/2021   PEG PLACEMENT  05/26/2021   TRACHEOSTOMY  05/26/2021     Social History:   reports that he has never smoked. He has never used smokeless tobacco. He reports that he does not drink alcohol and does not use drugs.   Family History:  His family history includes Cancer in his father; Diabetes in his maternal grandfather.   Allergies Allergies  Allergen Reactions   Statins Other (See Comments)    Made hands swell   Penicillins Nausea And Vomiting and Other (See Comments)    Other reaction(s): GI intolerance Other reaction(s): Nausea And Vomiting, Other (See Comments), Other (see comments) Other reaction(s): Other (See Comments) Other reaction(s): Nausea And Vomiting, Other (See Comments), Other (see comments) Other reaction(s): Nausea And Vomiting, Other (See Comments), Other (see comments)    Ropinirole Other (See Comments)     Home Medications  Prior to Admission medications   Medication Sig Start Date End Date Taking? Authorizing Provider  amantadine (SYMMETREL) 100 MG capsule Take 50 mg by mouth daily.   Yes [provider]  amiodarone (PACERONE) 200 MG tablet Take 200 mg by mouth daily.   Yes [provider]  b complex-vitamin c-folic acid (NEPHRO-VITE) 0.8 MG TABS tablet Take 1 tablet by mouth daily.   Yes [provider]  busPIRone (BUSPAR) 5 MG tablet Take 7.5 mg  by mouth 2 (two) times daily.   Yes [provider]  Carboxymethylcellulose Sod PF 0.5 % SOLN Place 1 drop into both eyes in the morning, at noon, and at bedtime.   Yes [provider]  Cholecalciferol (VITAMIN D3) 25 MCG (1000 UT) CAPS Take 1,000 Units by mouth daily.   Yes [provider]  ferrous sulfate 300 (60 Fe) MG/5ML syrup Take 300 mg by mouth daily.   Yes [provider]  fiber (NUTRISOURCE FIBER) PACK packet Take 1 packet by mouth in the morning, at noon, and at bedtime.   Yes [provider]  insulin glargine (LANTUS) 100 UNIT/ML injection Inject 28 Units into the skin 2 (two) times daily.   Yes [provider]  insulin lispro (HUMALOG) 100 UNIT/ML injection Inject 1 Units into the skin every 6 (six) hours.  Per sliding scale   Yes [provider]  ipratropium-albuterol (DUONEB) 0.5-2.5 (3) MG/3ML SOLN Take 3 mLs by nebulization in the morning, at noon, and at bedtime.   Yes [provider]  latanoprost (XALATAN) 0.005 % ophthalmic solution Place 1 drop into both eyes at bedtime.   Yes [provider]  levothyroxine (SYNTHROID) 75 MCG tablet Take 75 mcg by mouth daily before breakfast.   Yes [provider]  loratadine (CLARITIN) 10 MG tablet Take 10 mg by mouth daily.   Yes [provider]  Melatonin 3 MG CAPS Take 3 mg by mouth at bedtime.   Yes [provider]  metoprolol tartrate (LOPRESSOR) 50 MG tablet Take 50 mg by mouth 2 (two) times daily.   Yes [provider]  Metoprolol Tartrate (METOPROLOL TARTARATE 1 MG/ML SYRINGE, 5ML,) Inject 2.5 mg into the vein every 6 (six) hours as needed (high blood pressure).   Yes [provider]  modafinil (PROVIGIL) 100 MG tablet Take 100 mg by mouth daily.   Yes [provider]  nutrition supplement, JUVEN, (JUVEN) PACK Take 1 packet by mouth in the morning, at noon, and at bedtime.   Yes [provider]   Omega-3 Fatty Acids (FISH OIL) 1000 MG CAPS Take 4,000 mg by mouth daily.   Yes [provider]  pantoprazole sodium (PROTONIX) 40 mg/20 mL SUSP Take 40 mg by mouth daily.   Yes [provider]  potassium chloride (KLOR-CON) 20 MEQ packet Take 40 mEq by mouth every 4 (four) hours as needed (low potassium).   Yes [provider]  predniSONE (DELTASONE) 20 MG tablet Take 20 mg by mouth daily with breakfast.   Yes [provider]  pregabalin (LYRICA) 50 MG capsule Take 50 mg by mouth daily.   Yes [provider]  primidone (MYSOLINE) 50 MG tablet Take 50 mg by mouth daily.   Yes [provider]  risperiDONE (RISPERDAL) 1 MG tablet Take 1 mg by mouth 2 (two) times daily.   Yes [provider]  sertraline (ZOLOFT) 50 MG tablet Take 100 mg by mouth at bedtime.   Yes [provider]  vitamin C (ASCORBIC ACID) 500 MG tablet Take 500 mg by mouth daily.   Yes [provider]        Lanier Clam, MD [place] for contact info

## 2021-07-27 NOTE — Progress Notes (Signed)
Physical Therapy Wound Treatment Patient Details  Name: Tony Young MRN: [id number] Date of Birth: [birth year]  Today's Date: 07/27/2021 PT Individual Time: 1107-1142 PT Individual Time Calculation (min): 35 min   Subjective  Subjective: Pt agreeable to hydrotherapy Patient and Family Stated Goals: Heal wound Date of Onset:  (Unknown) Prior Treatments: Dressing changes  Pain Score:  Pt received pain meds at beginning of session.   Wound Assessment  Pressure Injury 07/15/21 Buttocks Left Unstageable - Full thickness tissue loss in which the base of the injury is covered by slough (yellow, tan, gray, green or brown) and/or eschar (tan, brown or black) in the wound bed. (Active)  Dressing Type Foam - Lift dressing to assess site every shift;Dakin's-soaked gauze;Barrier Film (skin prep);Moist to moist 07/27/21 1100  Dressing Changed;Clean, Dry, Intact 07/27/21 1100  Dressing Change Frequency Daily 07/27/21 1100  State of Healing Eschar 07/27/21 1100  Site / Wound Assessment Bleeding;Pink;Red;Black 07/27/21 1100  % Wound base Red or Granulating 10% 07/27/21 1100  % Wound base Yellow/Fibrinous Exudate 50% 07/27/21 1100  % Wound base Black/Eschar 40% 07/27/21 1100  % Wound base Other/Granulation Tissue (Comment) 0% 07/27/21 1100  Peri-wound Assessment Intact 07/27/21 1100  Wound Length (cm) 7.2 cm 07/17/21 1518  Wound Width (cm) 7 cm 07/17/21 1518  Wound Depth (cm) 0.2 cm 07/17/21 1518  Wound Surface Area (cm^2) 50.4 cm^2 07/17/21 1518  Wound Volume (cm^3) 10.08 cm^3 07/17/21 1518  Tunneling (cm) 0 07/17/21 1518  Undermining (cm) 0 07/17/21 1518  Margins Unattached edges (unapproximated) 07/27/21 1100  Drainage Amount Moderate 07/27/21 1100  Drainage Description Serosanguineous 07/27/21 1100  Treatment Debridement (Selective);Hydrotherapy (Pulse lavage);Packing (Saline gauze) 07/27/21 1100      Hydrotherapy Pulsed lavage therapy - wound location: L buttock Pulsed Lavage  with Suction (psi): 12 psi Pulsed Lavage with Suction - Normal Saline Used: 1000 mL Pulsed Lavage Tip: Tip with splash shield Selective Debridement Selective Debridement - Location: L buttock Selective Debridement - Tools Used: Forceps;Scalpel Selective Debridement - Tissue Removed: yellow and black necrotic tissue   Wound Assessment and Plan  Wound Therapy - Assess/Plan/Recommendations Wound Therapy - Clinical Statement: Wound continues with foul odor and increased black necrotic tissue. Appears improved after pulsed lavage however black tissue adherent and difficult to debride. Reesa Chew, PA-C present at beginning of session to assess wound with therapist. Will continue Dakin's and reassess later in the week. Wound Therapy - Functional Problem List: Immobility Factors Delaying/Impairing Wound Healing: Diabetes Mellitus;Immobility;Multiple medical problems;Polypharmacy Hydrotherapy Plan: Debridement;Dressing change;Patient/family education;Pulsatile lavage with suction Wound Therapy - Frequency: 6X / week Wound Therapy - Follow Up Recommendations: dressing changes by RN  Wound Therapy Goals- Improve the function of patient's integumentary system by progressing the wound(s) through the phases of wound healing (inflammation - proliferation - remodeling) by: Decrease Necrotic Tissue to: 20% Decrease Necrotic Tissue - Progress: Progressing toward goal Increase Granulation Tissue to: 80% Increase Granulation Tissue - Progress: Progressing toward goal Goals/treatment plan/discharge plan were made with and agreed upon by patient/family: Yes Time For Goal Achievement: 7 days Wound Therapy - Potential for Goals: Good  Goals will be updated until maximal potential achieved or discharge criteria met.  Discharge criteria: when goals achieved, discharge from hospital, MD decision/surgical intervention, no progress towards goals, refusal/missing three consecutive treatments without notification or  medical reason.  GP     [name] 07/27/2021, 12:05 PM  Rolinda Roan, PT, DPT Acute Rehabilitation Services Pager: [phone] Office: [phone]

## 2021-07-27 NOTE — Progress Notes (Signed)
Physical Therapy Weekly Progress Note  Patient Details  Name: Tony Young MRN: [id number] Date of Birth: Nov 23, 1955  Beginning of progress report period: July 17, 2021 End of progress report period: July 27, 2021  Today's Date: 07/27/2021 PT Individual Time: 6237-6283 PT Individual Time Calculation (min): 43 min   Patient has met 4 of 4 short term goals.  Pt can perform bed mobility with max A, mod A x 2. Requires at least CGA, up to mod A to maintain sitting EOB.   Patient continues to demonstrate the following deficits muscle weakness, decreased cardiorespiratoy endurance, and decreased sitting balance, decreased standing balance, and decreased balance strategies and therefore will continue to benefit from skilled PT intervention to increase functional independence with mobility.  Patient progressing toward long term goals..  Continue plan of care.  PT Short Term Goals Week 1:  PT Short Term Goal 1 (Week 1): Pt will complete bed mobility with maxA of 1 person PT Short Term Goal 1 - Progress (Week 1): Met PT Short Term Goal 2 (Week 1): Pt will complete bed<>chair transfer with maxA +2 PT Short Term Goal 2 - Progress (Week 1): Met PT Short Term Goal 3 (Week 1): Pt will complete sit<>stand transfer with maxA +2 PT Short Term Goal 3 - Progress (Week 1): Met PT Short Term Goal 4 (Week 1): Pt will tolerate sitting in TIS w/c for >2 hrs outside of therapy PT Short Term Goal 4 - Progress (Week 1): Met Week 2:  PT Short Term Goal 2 (Week 2): Pt will perform bed mobility with mod a X 1 PT Short Term Goal 3 (Week 2): Pt will perform STS with max A x 1 PT Short Term Goal 4 (Week 2): Pt will sit EOB with supervision x 30 sec  Skilled Therapeutic Interventions/Progress Updates:  pt received in bed and agreeable to therapy. Pt reports 7/10 pain, premedicated. Rest and positioning provided as needed. Pt initially agreeable to sitting EOB. Supine>sit toward R side with max A x 2.  Pt unable to maintain upright sitting, falling toward R side. Pt able to use visual cues to assess midline orientation without cue, but unable to use RUE to maintain upright position. At this time, pt states "I can't take this any more." And requests to lie down. Sit>supine mod A x 2. Transition to bed level exercise. Modified heel slides 3 x 6 for LE strength and endurance. Pt required extended time for all mobility. Pt c/o odd feeling on R foot, Therapist performed skin inspection, no new skin issues noted, pt with extremely dry, flaky skin. Pt positioned in sidelying for buttocks pressure relief and therapeutically positioned with pillows for comfort and support of all extremities. Pt remained in bed and was left with all needs in reach and alarm active.    Therapy Documentation Precautions:  Precautions Precautions: Fall Precaution Comments: trach, PEG, unable to read, fragile skin Restrictions Weight Bearing Restrictions: No General:     Therapy/Group: Individual Therapy  Mickel Fuchs 07/27/2021, 1:35 PM

## 2021-07-27 NOTE — Progress Notes (Signed)
PROGRESS NOTE   Subjective/Complaints:  Pt reports doesn't remember last BM, however denies constipation.  Had a fair weekend.  Knows surgery planned for Wednesday.  Will switch to Heparin gtt since need to stop Lovenox for surgery.   Also spoke with PA multiple times about wounds and plastics and Heparin gtt.   ROS:  Pt denies SOB, abd pain, CP, N/V/C/D, and vision changes      Objective:   No results found. Recent Labs    07/27/21 0722  WBC 2.9*  HGB 8.8*  HCT 28.7*  PLT 136*    Recent Labs    07/27/21 0722  NA 135  K 3.1*  CL 99  CO2 26  GLUCOSE 95  BUN 17  CREATININE 1.31*  CALCIUM 8.2*     Intake/Output Summary (Last 24 hours) at 07/27/2021 1816 Last data filed at 07/27/2021 1811 Gross per 24 hour  Intake 1080 ml  Output 1700 ml  Net -620 ml     Pressure Injury 07/15/21 Buttocks Left Unstageable - Full thickness tissue loss in which the base of the injury is covered by slough (yellow, tan, gray, green or brown) and/or eschar (tan, brown or black) in the wound bed. (Active)  07/15/21 1520  Location: Buttocks  Location Orientation: Left  Staging: Unstageable - Full thickness tissue loss in which the base of the injury is covered by slough (yellow, tan, gray, green or brown) and/or eschar (tan, brown or black) in the wound bed.  Wound Description (Comments):   Present on Admission: Yes    Physical Exam: Vital Signs Blood pressure (!) 100/58, pulse 100, temperature 98 F (36.7 C), temperature source Oral, resp. rate 18, height 5\' 7"  (1.702 m), weight 112.9 kg, SpO2 93 %.    General: awake, alert, appropriate, supine in bed; woke him up;NAD HENT: conjugate gaze; oropharynx dry- has TRACH and PMV CV: regular rate; no JVD Pulmonary: CTA B/L; no W/R/R- decreased at bases B/L  GI: soft, NT, ND, (+)BS- slightly hypoactive Psychiatric: appropriate Neurological: alert sleepy Skin-    Mild  dysarthria RUE weakness   Assessment/Plan: 1. Functional deficits which require 3+ hours per day of interdisciplinary therapy in a comprehensive inpatient rehab setting. Physiatrist is providing close team supervision and 24 hour management of active medical problems listed below. Physiatrist and rehab team continue to assess barriers to discharge/monitor patient progress toward functional and medical goals  Care Tool:  Bathing    Body parts bathed by patient: Face   Body parts bathed by helper: Right arm, Left arm, Chest, Abdomen, Front perineal area, Buttocks, Right upper leg, Left upper leg, Right lower leg, Left lower leg, Face Body parts n/a: Left lower leg   Bathing assist Assist Level: 2 Helpers     Upper Body Dressing/Undressing Upper body dressing   What is the patient wearing?: Hospital gown only    Upper body assist Assist Level: Total Assistance - Patient < 25%    Lower Body Dressing/Undressing Lower body dressing      What is the patient wearing?: Pants, Underwear/pull up     Lower body assist Assist for lower body dressing: 2 Helpers     Toileting Toileting  Toileting assist Assist for toileting: 2 Helpers     Transfers Chair/bed transfer  Transfers assist  Chair/bed transfer activity did not occur: Safety/medical concerns        Locomotion Ambulation   Ambulation assist   Ambulation activity did not occur: Safety/medical concerns          Walk 10 feet activity   Assist  Walk 10 feet activity did not occur: Safety/medical concerns        Walk 50 feet activity   Assist Walk 50 feet with 2 turns activity did not occur: Safety/medical concerns         Walk 150 feet activity   Assist Walk 150 feet activity did not occur: Safety/medical concerns         Walk 10 feet on uneven surface  activity   Assist Walk 10 feet on uneven surfaces activity did not occur: Safety/medical concerns          Wheelchair     Assist Is the patient using a wheelchair?: Yes Type of Wheelchair: Manual Wheelchair activity did not occur: Safety/medical concerns         Wheelchair 50 feet with 2 turns activity    Assist    Wheelchair 50 feet with 2 turns activity did not occur: Safety/medical concerns       Wheelchair 150 feet activity     Assist  Wheelchair 150 feet activity did not occur: Safety/medical concerns       Blood pressure (!) 100/58, pulse 100, temperature 98 F (36.7 C), temperature source Oral, resp. rate 18, height 5\' 7"  (1.702 m), weight 112.9 kg, SpO2 93 %.  Medical Problem List and Plan: 1. 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    Con't CIR- PT, OT and SLP 2.  Antithrombotics: -DVT/anticoagulation:  Mechanical: Sequential compression devices, below knee Right lower extremity --high risk for DVTs due to body habitus and immobility. Will check dopplers in am. 2/17- pt has extensive DVTs in RLE and some in LLE- as above- placed on tx dose lovenox   2/20- Plts low 100s, however Hb stable- con't regimen- cannot place on DOAC_ will need Warfarin when change over per pharmacy 2/22- wait to change due to surgical intervention planned.  2/23- d/w Plastic-s will cont lovenox until 1-2 days before surgery then change to heparin drip- will need to go back to acute for skin graft on 3/1 2/27- will change to Heparin gtt due to surgery Wednesday             -antiplatelet therapy: N/A 3. Pain: continue oxycodone prn. 2/16- main pain is R shoulder- con't regimen prn 2/22- L knee looks like swelling/effusion- however likely a strain- con't oxy prnand added tramadol TID  2/27- pain usually controlled- con't regimen 4. Mood: LCSW to follow for evaluation and support.              -antipsychotic  agents:  Risperdal bid 5. Neuropsych: This patient is capable of making decisions on his own behalf. 6. Skin/Wound Care: Air mattress due to sacral decub and necrotizing fasciitis LLE-              --continue vitamins and supplements to promote wound healing.  7. Fluids/Electrolytes/Nutrition:  Strict I/O as continues to show signs of overload/anasarca.             --  low salt diet. Continue Juven to help with protein stores.  2/22- will d/w nutrition if needs Tfs anymore- eating meals?- calorie count being finished today.   2/24- hard ot figure out nutrition since been NPO for 3 days now 8. Strep pyogenes bacteremia/Necrotizing fascitis LLE s/p I & D: Continue daily dressing changes.              --maintain adequate nutritional status to help promote healing. 9. Acute  reps failure s/p trach- and COPD: Respiratory status stable. Continue steriod taper  2/16- con't PMV and SLP for swallowing/speech/phonation  2/20- talking with Resp therapy if can downsize trach- I think we likely can to #4, however will need to discuss directly with Resp therapy.   2/27- won't change right now because going to OR and it's better with #6 shiley.  10 OSA/OHS?/VDRF: Continue button plugging during the day and ATC at nights for humidification             --Started discussion that he will likely go home with trach.  11. H/o GIB: H/H improving. Continue PPI BID.              --no signs of bleeding. Will start low dose Lovenox if platelets stable.   Monitor CBC on anticoagulation  CBC Latest Ref Rng & Units 07/27/2021 07/24/2021 07/20/2021  WBC 4.0 - 10.5 K/uL 2.9(L) 4.2 3.5(L)  Hemoglobin 13.0 - 17.0 g/dL 8.8(L) 8.6(L) 8.9(L)  Hematocrit 39.0 - 52.0 % 28.7(L) 27.0(L) 27.7(L)  Platelets 150 - 400 K/uL 136(L) 122(L) 111(L)    2/24- Hb stable- con't regimen 12. Thrombocytopenia: Platelets continue to fluctuate.   2/16- Plts up to 166k  2/20- Plts down to 111k- will recheck Thursday  2/27- Plts 136- slightly  better 13.  T2DM with hyperglycemia:  Check Hgb A1c in am. Will monitor BS ac/hs with SSI for elevated BS             Add 2U Semglee HS             --continue tube feeds at nights (likely contributing to diarrhea)     2/17- Pt CBGs are running 200sto300s- will increase Semglee to 6 units nightly- from 2 units and monitor for trend CBG (last 3)  Recent Labs    07/27/21 0637 07/27/21 1154 07/27/21 1641  GLUCAP 100* 132* 238*  Increased semglee to 20U change SSI to Moderate   2/20- just got increased yesterday- wait to change meds for 24 hours- in midst of calorie count to see if can stop TF's.   2/21- will increase semglee to 24 units and maintain SSI- since CBGs are in 200s-300s-   2/27- CBGs looks much better- con't regimen 14. R RTC injury- probably partial tear-   2/16- sustained in [facility]- will write note above HOB to remind staff to protect R shoulder.  15. Extensive B/L DVTs-   2/17- will check CBC this weekend, since recent hx of GI bleed. Don't want pt having PE due to trach and resp failure requiring trach.   2/23- Hb stable- however will con't lovenox until have ot switch to warfarin after surgery 16. Necrotizing fasciitis of LLE  2/21- looks less beefy and deeper red- concerning to me with more slough- called Gen surg, then ortho- they will look at pt, however might need Plastics--   2/22- plastics says pt ready for skin grafts- could do 3/1- but I am concerned pt will need to leave rehab to get surgical intervention- still d/w plastics  2/23- will arrange to go back to acute on 3/1 after surgery  2/27- surgery Wednesday- will see if they can also debride Buttock pressure ulcer that's unstageable vs Stage IV 17.  Thrombocytopenia  Platelets 122 on 2/24 18.  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

## 2021-07-27 NOTE — Progress Notes (Addendum)
ANTICOAGULATION CONSULT NOTE - Initial Consult  Pharmacy Consult for transition from Lovenox to Heparin infusion Indication: DVT  Allergies  Allergen Reactions   Statins Other (See Comments)    Made hands swell   Penicillins Nausea And Vomiting and Other (See Comments)    Other reaction(s): GI intolerance Other reaction(s): Nausea And Vomiting, Other (See Comments), Other (see comments) Other reaction(s): Other (See Comments) Other reaction(s): Nausea And Vomiting, Other (See Comments), Other (see comments) Other reaction(s): Nausea And Vomiting, Other (See Comments), Other (see comments)    Ropinirole Other (See Comments)    Patient Measurements: Height: 5\' 7"  (170.2 cm) Weight: 112.9 kg (248 lb 14.4 oz) IBW/kg (Calculated) : 66.1  Heparin Dosing Weight: 91.7 kg  Vital Signs: Temp: 97.9 F (36.6 C) (02/27 0556) BP: 101/73 (02/27 0556) Pulse Rate: 95 (02/27 1150)  Labs: Recent Labs    07/27/21 0722  HGB 8.8*  HCT 28.7*  PLT 136*  CREATININE 1.31*    Estimated Creatinine Clearance: 67.4 mL/min (A) (by C-G formula based on SCr of 1.31 mg/dL (H)).   Medical History: Past Medical History:  Diagnosis Date   CAD (coronary artery disease)    Cardiomyopathy ([place])    Colon cancer ([facility])    COPD (chronic obstructive pulmonary disease) ([facility])    OSA (obstructive sleep apnea)    Type 2 diabetes mellitus (H[facility])     Medications:  Scheduled:   amantadine  50 mg Oral Daily   amiodarone  200 mg Oral BID   vitamin C  500 mg Oral BID   busPIRone  7.5 mg Oral BID   cholecalciferol  1,000 Units Oral Daily   collagenase   Topical Daily   empagliflozin  10 mg Oral Daily   enoxaparin (LOVENOX) injection  120 mg Subcutaneous BID   feeding supplement  237 mL Oral BID BM   ferrous sulfate  300 mg Per Tube BID WC   free water  50 mL Per Tube BID   furosemide  20 mg Oral BID   insulin aspart  0-15 Units Subcutaneous TID WC   insulin aspart  0-5 Units Subcutaneous QHS   insulin  glargine-yfgn  24 Units Subcutaneous QHS   latanoprost  1 drop Both Eyes QHS   levothyroxine  75 mcg Oral Q0600   loratadine  10 mg Oral Daily   melatonin  3 mg Oral QHS   metoprolol tartrate  25 mg Oral BID   multivitamin with minerals  1 tablet Oral Daily   nutrition supplement (JUVEN)  1 packet Per Tube BID BM   pantoprazole sodium  40 mg Per Tube BID   polycarbophil  625 mg Oral BID   predniSONE  10 mg Oral Q breakfast   pregabalin  50 mg Oral Daily   primidone  50 mg Oral Daily   risperiDONE  1 mg Oral BID   sertraline  100 mg Oral QHS   sodium hypochlorite   Topical BID   traMADol  50 mg Oral Q6H   zinc sulfate  220 mg Oral Daily   Infusions:   Assessment: 66 year-old male patient admitted to CIR due to functional deficit due to critical illness myopathy. He was diagnosed with an age indeterminate DVT (left femoral vein,, and SF junction). He was started on therapeutic lovenox at 120 mg [place] q12h. Patient is scheduled to go to the OR with plastic surgery on 07/29/2021 requiring transition to a heparin infusion.  Goal of Therapy:  Heparin level 0.3-0.7 units/ml aPTT 66-102  seconds Monitor platelets by anticoagulation protocol: Yes   Plan:  Start heparin infusion at 1500 units/hr- NO BOLUS. This will begin at 1800 on 07/27/2021 Check heparin level and aPTT in 8 hours and daily while on heparin Continue to monitor H&H and platelets  Skilynn Durney BS, PharmD, BCPS Clinical Pharmacist 07/27/2021 1:26 PM

## 2021-07-27 NOTE — Progress Notes (Signed)
Occupational Therapy Session Note  Patient Details  Name: Tony Young MRN: [id number] Date of Birth: 06-14-1955  Today's Date: 07/27/2021 OT Individual Time: 1000-1040 OT Individual Time Calculation (min): 40 min    Short Term Goals: Week 1:  OT Short Term Goal 1 (Week 1): Patient will compelte UB bathing with Max A in supine. OT Short Term Goal 1 - Progress (Week 1): Progressing toward goal OT Short Term Goal 2 (Week 1): Patient will complete UB dressing wtih Max A in supine. OT Short Term Goal 2 - Progress (Week 1): Progressing toward goal OT Short Term Goal 3 (Week 1): Patient will maintain static sitting balance with Mod A seated EOB in prep for ADLs. OT Short Term Goal 3 - Progress (Week 1): Progressing toward goal OT Short Term Goal 4 (Week 1): Patient will complete sit to stand transfer with Max A +2 and LRAD. OT Short Term Goal 4 - Progress (Week 1): Not met Week 2:  OT Short Term Goal 1 (Week 2): Patient will maintain static sitting balance with Mod A seated EOB in prep for ADLs. OT Short Term Goal 2 (Week 2): Pt will perform UB bathing in supine/HOB elevated Max A OT Short Term Goal 3 (Week 2): Pt will improve functional use of hands to grasp/release 3/3 items OT Short Term Goal 4 (Week 2): Pt will perform stedy transfer with 1 helper   Skilled Therapeutic Interventions/Progress Updates:    Pt greeted at time of session bed level resting with nursing finishing up med pass. 2+ helper present for all mobility and ADL tasks. Pt bed level at beginning of session, irritated with simple questions regarding ADL needs. Pt performing bed mobility with log roll to the L side with Mod of 2, and sidelying > sitting same manner. Initially needing Max A for sitting balance fading to Min/CGA but total A to adjust hips on air bed. Performed UB bathing and dressing sitting at EOB, again encouraged the pt to attempt to perform any aspect of bathing and dressing but continued to refuse  stating "I cant do it." Dependent for UB bathing, able to abduct shoulders enough for therapist to wash armpits, same manner for face despite attempts to do independently. UB dress for hospital gown with total A as well before sit > supine Max A of 2. Pt scooted up in bed 2 assist, assisted with drinking water HOB elevated prior to exit with full supervision. Call bell in reach all needs met.   Therapy Documentation Precautions:  Precautions Precautions: Fall Precaution Comments: trach, PEG, unable to read, fragile skin Restrictions Weight Bearing Restrictions: No     Therapy/Group: Individual Therapy  Viona Gilmore 07/27/2021, 7:28 AM

## 2021-07-27 NOTE — Progress Notes (Signed)
Speech Language Pathology Daily Session Note  Patient Details  Name: Tony Young MRN: [id number] Date of Birth: [birth year]  Today's Date: 07/27/2021 SLP Individual Time: 7654-6503 SLP Individual Time Calculation (min): 53 min  Short Term Goals: Week 2: SLP Short Term Goal 1 (Week 2): Pt will consumed thin liquids (cup or straw) with minimal overt s/s aspiration and supervision a verbal cues for use of small sips. SLP Short Term Goal 2 (Week 2): Patient will consume dys 3 trials with effective mastication, minimal oral residuals, and without overt s/sx of aspiration with min A verbal cues to implement safe swallow precautions and strategies prior to diet advancement SLP Short Term Goal 3 (Week 2): Patient will utilize external/internal memory aids to recall important information with minA.  Skilled Therapeutic Interventions:Skilled ST services focused on swallow and cognitive skills. Pt's PMSV was in place and all vital signs WFL during treatment session. SLP facilitated PO consumption of both dys 3 textures and regular textures snack along with thin liquids via straw. Pt reported consuming regular textures prior to admission with edentulous status, however upon description appeared primarily dys 3 texture items. Pt demonstrated slightly prolonged, but appropriate mastication on both textures. Pt demonstrated oral clearance, swallow appeared timely and no overt s/s aspiration noted except for on one occurrence after a liquid wash. Pt reported cough was not due to aspiration, " I just needed to cough." SLP recommend continuing on dys 2 textures with a trial tray of dys 3 texture in future sessions. SLP facilitated instruction of visualization and association strategies for short term recall. Pt was able to recall 2/4 words piror to education of strategies, 3/4 words after education and 4/4 given a category cue. Pt supports cognitive skills appear near baseline. Pt expressed fatigue and desire  to rest. Pt missed 7 minutes of treatment session. Pt was left in room with call bell within reach and bed alarm set. SLP recommends to continue skilled services.     Pain Pain Assessment Faces Pain Scale: Hurts a little bit Pain Type: Acute pain  Therapy/Group: Individual Therapy  Ifeoluwa Bartz  [facility] 07/27/2021, 3:27 PM

## 2021-07-27 NOTE — Progress Notes (Signed)
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 It continues to recur and tends to bleed which is also limiting their ability to progress. Will reach out to plastics to see if this can be addressed while pt in OR>

## 2021-07-28 ENCOUNTER — Inpatient Hospital Stay: Admission: AD | Admit: 2021-07-28 | Payer: Medicare Other | Source: Ambulatory Visit | Admitting: Internal Medicine

## 2021-07-28 DIAGNOSIS — I4892 Unspecified atrial flutter: Secondary | ICD-10-CM

## 2021-07-28 DIAGNOSIS — G7281 Critical illness myopathy: Secondary | ICD-10-CM | POA: Diagnosis not present

## 2021-07-28 DIAGNOSIS — G4733 Obstructive sleep apnea (adult) (pediatric): Secondary | ICD-10-CM

## 2021-07-28 DIAGNOSIS — M25562 Pain in left knee: Secondary | ICD-10-CM

## 2021-07-28 DIAGNOSIS — M726 Necrotizing fasciitis: Secondary | ICD-10-CM

## 2021-07-28 DIAGNOSIS — I82419 Acute embolism and thrombosis of unspecified femoral vein: Secondary | ICD-10-CM

## 2021-07-28 DIAGNOSIS — E662 Morbid (severe) obesity with alveolar hypoventilation: Secondary | ICD-10-CM

## 2021-07-28 DIAGNOSIS — D709 Neutropenia, unspecified: Secondary | ICD-10-CM

## 2021-07-28 LAB — CBC
HCT: 30 % — ABNORMAL LOW (ref 39.0–52.0)
Hemoglobin: 9.2 g/dL — ABNORMAL LOW (ref 13.0–17.0)
MCH: 27.5 pg (ref 26.0–34.0)
MCHC: 30.7 g/dL (ref 30.0–36.0)
MCV: 89.8 fL (ref 80.0–100.0)
Platelets: 149 10*3/uL — ABNORMAL LOW (ref 150–400)
RBC: 3.34 MIL/uL — ABNORMAL LOW (ref 4.22–5.81)
RDW: 17.2 % — ABNORMAL HIGH (ref 11.5–15.5)
WBC: 3.3 10*3/uL — ABNORMAL LOW (ref 4.0–10.5)
nRBC: 2.4 % — ABNORMAL HIGH (ref 0.0–0.2)

## 2021-07-28 LAB — APTT
aPTT: 79 seconds — ABNORMAL HIGH (ref 24–36)
aPTT: 85 seconds — ABNORMAL HIGH (ref 24–36)

## 2021-07-28 LAB — HEPARIN LEVEL (UNFRACTIONATED)
Heparin Unfractionated: >1.1 IU/mL — ABNORMAL HIGH (ref 0.30–0.70)
Heparin Unfractionated: >1.1 IU/mL — ABNORMAL HIGH (ref 0.30–0.70)

## 2021-07-28 LAB — GLUCOSE, CAPILLARY
Glucose-Capillary: 96 mg/dL (ref 70–99)
Glucose-Capillary: 108 mg/dL — ABNORMAL HIGH (ref 70–99)
Glucose-Capillary: 153 mg/dL — ABNORMAL HIGH (ref 70–99)
Glucose-Capillary: 146 mg/dL — ABNORMAL HIGH (ref 70–99)

## 2021-07-28 MED ORDER — CHLORHEXIDINE GLUCONATE CLOTH 2 % EX PADS: 6.0000 | MEDICATED_PAD | Freq: Once | CUTANEOUS | Status: DC

## 2021-07-28 MED ORDER — ENSURE MAX PROTEIN PO LIQD
330.0000 [oz_av] | Freq: Every day | ORAL | Status: DC
  Administered 2021-07-28: 330 [oz_av] via ORAL
  Filled 2021-07-28 (×2): qty 9900

## 2021-07-28 MED ORDER — SORBITOL 70 % SOLN
30.0000 mL | Freq: Every day | Status: DC | PRN
  Administered 2021-07-28: 30 mL via ORAL
  Filled 2021-07-28: qty 30

## 2021-07-28 NOTE — Progress Notes (Signed)
Inpatient Rehabilitation Discharge Medication Review by a Pharmacist  A complete drug regimen review was completed for this patient to identify any potential clinically significant medication issues.  High Risk Drug Classes Is patient taking? Indication by Medication  Antipsychotic Yes Risperdal- MDD adjunct therapy  Anticoagulant No   Antibiotic No   Opioid Yes Tramadol, OxyIR- acute pain  Antiplatelet No   Hypoglycemics/insulin Yes Semglee, iSS- T2DM  Vasoactive Medication Yes Lopressor, Lasix, amiodarone- rate control, hypertension, CHF  Chemotherapy No   Other Yes Zoloft- MDD Protonix- GERD/recent GIB Melatonin- sleep Synthroid- hypothyroidism Xalatan- glaucoma Buspar- anxiety Claritin- allergies     Type of Medication Issue Identified Description of Issue Recommendation(s)  Drug Interaction(s) (clinically significant)     Duplicate Therapy     Allergy     No Medication Administration End Date     Incorrect Dose     Additional Drug Therapy Needed  Heparin infusion vs Lovenox bridging with new start warfarin Address which anticoagulant to start when patient is s/p-op. May have additional surgeries making heparin a better option in the interim Heparin infusion was stopped 3/1 at 0900 in anticipation of surgery at 1500. Will need another pharmacy consult to continue dosing.   Significant med changes from prior encounter (inform family/care partners about these prior to discharge).    Other       Clinically significant medication issues were identified that warrant physician communication and completion of prescribed/recommended actions by midnight of the next day:  No  Time spent performing this drug regimen review (minutes):  30   Sheylin Scharnhorst BS, PharmD, BCPS Clinical Pharmacist 07/28/2021 12:58 PM

## 2021-07-28 NOTE — Progress Notes (Signed)
Speech Language Pathology Note  Patient Details  Name: Tony Young MRN: [id number] Date of Birth: 03/31/1956 Today's Date: 07/28/2021 Speech Therapy Discharge Note   This patient was unable to complete the inpatient rehab program due to discharge to acute for surgical intervention for skin graft; therefore did not meet their long term goals. Pt left the program at a mod-min A for swallow and memory skills. This patient is being discharged from [place] services at this time.  See CareTool for functional status details.    Marceil Welp  [facility] 07/28/2021, 5:10 PM

## 2021-07-28 NOTE — Progress Notes (Signed)
Physical Therapy Wound Treatment Patient Details  Name: Tony Young MRN: [id number] Date of Birth: [birth year]  Today's Date: 07/28/2021 PT Individual Time: 5009-3818 PT Individual Time Calculation (min): 48 min   Subjective  Subjective: Pt agreeable to hydrotherapy, quiet during session with minimal verbalizations. Patient and Family Stated Goals: Heal wound Date of Onset:  (Unknown) Prior Treatments: Dressing changes  Pain Score:  Pt reports pain throughout treatment. Reports he was premedicated.   Wound Assessment  Pressure Injury 07/15/21 Buttocks Left Unstageable - Full thickness tissue loss in which the base of the injury is covered by slough (yellow, tan, gray, green or brown) and/or eschar (tan, brown or black) in the wound bed. (Active)  Dressing Type Foam - Lift dressing to assess site every shift;Gauze (Comment);Dakin's-soaked gauze;Barrier Film (skin prep);Moist to moist 07/28/21 1300  Dressing Clean, Dry, Intact;Changed 07/28/21 1300  Dressing Change Frequency Daily 07/28/21 1300  State of Healing Eschar 07/28/21 1300  Site / Wound Assessment Red;Yellow;Black 07/28/21 1300  % Wound base Red or Granulating 10% 07/28/21 1300  % Wound base Yellow/Fibrinous Exudate 40% 07/28/21 1300  % Wound base Black/Eschar 50% 07/28/21 1300  % Wound base Other/Granulation Tissue (Comment) 0% 07/28/21 1300  Peri-wound Assessment Intact 07/28/21 1300  Wound Length (cm) 7.3 cm 07/28/21 1300  Wound Width (cm) 6 cm 07/28/21 1300  Wound Depth (cm) 1 cm 07/28/21 1300  Wound Surface Area (cm^2) 43.8 cm^2 07/28/21 1300  Wound Volume (cm^3) 43.8 cm^3 07/28/21 1300  Tunneling (cm) 0 07/17/21 1518  Undermining (cm) 11:00-2:00 up to 1 cm 07/28/21 1300  Margins Unattached edges (unapproximated) 07/28/21 1300  Drainage Amount Moderate 07/28/21 1300  Drainage Description Serosanguineous 07/28/21 1300  Treatment Debridement (Selective);Hydrotherapy (Pulse lavage);Packing (Saline gauze)  07/28/21 1300      Hydrotherapy Pulsed lavage therapy - wound location: L buttock Pulsed Lavage with Suction (psi): 12 psi Pulsed Lavage with Suction - Normal Saline Used: 1000 mL Pulsed Lavage Tip: Tip with splash shield Selective Debridement Selective Debridement - Location: L buttock Selective Debridement - Tools Used: Forceps;Scalpel Selective Debridement - Tissue Removed: yellow and black necrotic tissue   Wound Assessment and Plan  Wound Therapy - Assess/Plan/Recommendations Wound Therapy - Clinical Statement: Wound continues with increased black necrotic tissue however odor seems to be improving. Noted pt to go to OR tomorrow. This patient would benefit from continued hydrotherapy for selective removal of unviable tissue, to decrease bioburden, and promote wound bed healing. Wound Therapy - Functional Problem List: Immobility Factors Delaying/Impairing Wound Healing: Diabetes Mellitus;Immobility;Multiple medical problems;Polypharmacy Hydrotherapy Plan: Debridement;Dressing change;Patient/family education;Pulsatile lavage with suction Wound Therapy - Frequency: 6X / week Wound Therapy - Follow Up Recommendations: dressing changes by RN  Wound Therapy Goals- Improve the function of patient's integumentary system by progressing the wound(s) through the phases of wound healing (inflammation - proliferation - remodeling) by: Decrease Necrotic Tissue to: 20% Decrease Necrotic Tissue - Progress: Progressing toward goal Increase Granulation Tissue to: 80% Increase Granulation Tissue - Progress: Progressing toward goal Goals/treatment plan/discharge plan were made with and agreed upon by patient/family: Yes Time For Goal Achievement: 7 days Wound Therapy - Potential for Goals: Good  Goals will be updated until maximal potential achieved or discharge criteria met.  Discharge criteria: when goals achieved, discharge from hospital, MD decision/surgical intervention, no progress towards  goals, refusal/missing three consecutive treatments without notification or medical reason.  GP     [name] 07/28/2021, 2:05 PM  Rolinda Roan, PT, DPT Acute Rehabilitation Services Pager: [phone] Office: [phone]

## 2021-07-28 NOTE — Progress Notes (Signed)
Pt not compliant with oxygen via trachea.. Pt stated he does not need it. Notified respiratory and pt was educated to have it at hs and off in the day from therapy per provider. Pt still do not want to have it. Respiratory notified. Pt is stable at this time with no changes.

## 2021-07-28 NOTE — Progress Notes (Signed)
Occupational Therapy Session Note  Patient Details  Name: Tony Young MRN: [id number] Date of Birth: 09-Jul-1955  Today's Date: 07/28/2021 OT Individual Time: 1525-1620 OT Individual Time Calculation (min): 55 min    Short Term Goals: Week 1:  OT Short Term Goal 1 (Week 1): Patient will compelte UB bathing with Max A in supine. OT Short Term Goal 1 - Progress (Week 1): Progressing toward goal OT Short Term Goal 2 (Week 1): Patient will complete UB dressing wtih Max A in supine. OT Short Term Goal 2 - Progress (Week 1): Progressing toward goal OT Short Term Goal 3 (Week 1): Patient will maintain static sitting balance with Mod A seated EOB in prep for ADLs. OT Short Term Goal 3 - Progress (Week 1): Progressing toward goal OT Short Term Goal 4 (Week 1): Patient will complete sit to stand transfer with Max A +2 and LRAD. OT Short Term Goal 4 - Progress (Week 1): Not met Week 2:  OT Short Term Goal 1 (Week 2): Patient will maintain static sitting balance with Mod A seated EOB in prep for ADLs. OT Short Term Goal 2 (Week 2): Pt will perform UB bathing in supine/HOB elevated Max A OT Short Term Goal 3 (Week 2): Pt will improve functional use of hands to grasp/release 3/3 items OT Short Term Goal 4 (Week 2): Pt will perform stedy transfer with 1 helper   Skilled Therapeutic Interventions/Progress Updates:    Pt greeted at time of session semireclined bed level resting, agreeable to OT session. No pain at rest but some discomfort/fatigue in sitting, did not rate any pain. Pt agreeable to ADL session and aware of skin graft tomorrow. Initial part of session focused on bed level brief change with bed mobility Max A rolling with hand over hand for placement on bed rail. Dependent for periarea hygiene and cleaning, same manner for brief placement. 2+ assist present at this time, pt performing bed mobility supine > sit Max of 2 and dependent to fix position of hips on air bed to scoot  backwards. UB bathing at EOB, Mod/Max to maintain sitting balance during activity today, hand over hand and Max A to wash under armpits and chest, again not wanting to attempt to wash face. Pt very fatigued at this time and returned to supine total of 2. Note pt's LLE having some blood around ankle area, nursing notified and LLE propped up in pillow. Nursing stating they would come back to reinforce later. Note pt did not hit his leg on anything during session, only sitting EOB and had ACE wrap on throughout session. Donned gown bed level for UB dress, total A. Remainder of session focused on B digit ROM primarily on PIP extension with static holds to prevent contracture development. Pt in bed resting soft call on chest, all needs met.   Therapy Documentation Precautions:  Precautions Precautions: Fall Precaution Comments: trach, PEG, unable to read, fragile skin Restrictions Weight Bearing Restrictions: No    Therapy/Group: Individual Therapy  Viona Gilmore 07/28/2021, 1:01 PM

## 2021-07-28 NOTE — Plan of Care (Signed)
Discharging to acute for surgery.

## 2021-07-28 NOTE — Progress Notes (Signed)
Pt. Refusing trach collar humidity, removed due to pt. Being upset.

## 2021-07-28 NOTE — Patient Care Conference (Signed)
Inpatient RehabilitationTeam Conference and Plan of Care Update Date: 07/28/2021   Time: 11:38 AM    Patient Name: Tony Young      Medical Record Number: [id number]  Date of Birth: Dec 15, 1955 Sex: Male         Room/Bed: 5C07C/5C07C-01 Payor Info: Payor: MEDICARE / Plan: MEDICARE PART A AND B / Product Type: *No Product type* /    Admit Date/Time:  07/15/2021  4:27 PM  Primary Diagnosis:  Critical illness myopathy  Hospital Problems: Principal Problem:   Critical illness myopathy Active Problems:   Tracheostomy status ([facility])   AF (paroxysmal atrial fibrillation) (HCC)   Chronic combined systolic and diastolic CHF (congestive heart failure) (HCC)   Thrombocytopenia (HCC)   Controlled type 2 diabetes mellitus with hyperglycemia, with long-term current use of insulin (HCC)   Atrial flutter (HCC)   OSA (obstructive sleep apnea)   Dvt femoral (deep venous thrombosis) (HCC)   Obesity hypoventilation syndrome (HCC)   Necrotizing fasciitis of lower leg (HCC)   Left knee pain   Neutrop[facility])    Expected Discharge Date: Expected Discharge Date: 07/29/21  Team Members Present: Physician leading conference: Dr. Courtney Heys Social Worker Present: Ovidio Kin, LCSW Nurse Present: Dorthula Nettles, RN PT Present: Ailene Rud, PT OT Present: Lillia Corporal, OT SLP Present: Charolett Bumpers, SLP PPS Coordinator present : Gunnar Fusi, SLP     Current Status/Progress Goal Weekly Team Focus  Bowel/Bladder   Continent B/B with episodes of bladder incontinence. LBM 07/24/21  Pt. will regain continence  Toilet q2 and PRN   Swallow/Nutrition/ Hydration             ADL's   Mod x2 supine > sit, Max 2 sit > supine, sitting EOB Mod - CGA, UB/LB ADLs total A of 2, refuses to attempt all tasks with BUEs, very easily agitated and irritated  Min-Mod A overall  Unsure? DC to acute for surgical intervention   Mobility   min-mod rolling, max/mod x 2 supine<>sit, CGA-mod to sit EOB   mod A  transfers, OOB tolerance, therapy tolerance   Communication             Safety/Cognition/ Behavioral Observations            Pain   Pain level 6/10. Well managed with current scheduled pain  medication  Will report pain level less thhan 2  Assess pain q shift and PRN   Skin   Pressure injury to buttocks, incison to LLE  Will regain skin integrity with no new breakdown or infection  Assess q shift and PRN     Discharge Planning:  Family aware pt will require max-total assist at discharge. Will need etensive family education prior to DC home.   Team Discussion: Confused this morning. Discharging to acute tomorrow for surgery, to do skin graft to lower extremity. Plastics to debride unstageable to left buttock. Ordered Sorbitol. Will discharge home with trach. Very low level, will not return to CIR. Incontinent B/B, reports 3/10 pain. Daughter manages care.   Patient on target to meet rehab goals: no, discharging to acute for surgery.  *See Care Plan and progress notes for long and short-term goals.   Revisions to Treatment Plan:  Finalizing discharge plans.   Teaching Needs: Family education, medication/pain management, skin/wound care, transfer training, etc.   Current Barriers to Discharge: Decreased caregiver support, Home enviroment access/layout, Trach, Incontinence, Wound care, Lack of/limited family support, Weight, and Pending surgery  Possible Resolutions to Barriers: Family education  Trach education Time toileting schedule Discharge to acute care     Medical Summary Current Status: LBM 4 days ago- trach/PMV; refusing O2 for humidification; incontinent B/B- feels it; don't remove Trach in the foreseeable future;  Barriers to Discharge: Decreased family/caregiver support;Home enviroment access/layout;Medical stability;Trach;Weight;Weight bearing restrictions;Wound care;Nutrition means;Medication compliance;Incontinence;Behavior  Barriers to Discharge Comments:  pt is so low level, needs to likely go to SNF after skin graft- will call daughter to go over plan Possible Resolutions to [employer] Focus: has cursed at OT; and doesn't like to work with therapy; very angry frequently/agitated; total A of 2 still; no improvement-back to acute for skin graft. cognition at baseline   Continued Need for Acute Rehabilitation Level of Care: The patient requires daily medical management by a physician with specialized training in physical medicine and rehabilitation for the following reasons: Direction of a multidisciplinary physical rehabilitation program to maximize functional independence : Yes Medical management of patient stability for increased activity during participation in an intensive rehabilitation regime.: Yes Analysis of laboratory values and/or radiology reports with any subsequent need for medication adjustment and/or medical intervention. : Yes   I attest that I was present, lead the team conference, and concur with the assessment and plan of the team.   Cristi Loron 07/28/2021, 5:36 PM

## 2021-07-28 NOTE — Progress Notes (Signed)
Physical Therapy Session Note  Patient Details  Name: Tony Young MRN: [id number] Date of Birth: Apr 07, 1956  Today's Date: 07/28/2021 PT Individual Time: 1000-1030 PT Individual Time Calculation (min): 30 min   Short Term Goals: Week 1:  PT Short Term Goal 1 (Week 1): Pt will complete bed mobility with maxA of 1 person PT Short Term Goal 1 - Progress (Week 1): Met PT Short Term Goal 2 (Week 1): Pt will complete bed<>chair transfer with maxA +2 PT Short Term Goal 2 - Progress (Week 1): Met PT Short Term Goal 3 (Week 1): Pt will complete sit<>stand transfer with maxA +2 PT Short Term Goal 3 - Progress (Week 1): Met PT Short Term Goal 4 (Week 1): Pt will tolerate sitting in TIS w/c for >2 hrs outside of therapy PT Short Term Goal 4 - Progress (Week 1): Met  Skilled Therapeutic Interventions/Progress Updates:    Pt initially resistant to therapy treatment, but agreeable to practice rolling. When uncovered, discovered pt's LLE wound had seeped through all layers of dressing, soaking pillow underneath. Therapist assisted RN with dressing changes to ease pt discomfort. Pt able to roll with max assist to facilitate dressing change. Pt therapeutically positioned in sidelying with pillows to support extremities. Pt left with soft call bell in reach. Pt missed 30 min d/t required nsg care.   Therapy Documentation Precautions:  Precautions Precautions: Fall Precaution Comments: trach, PEG, unable to read, fragile skin Restrictions Weight Bearing Restrictions: No General:     Therapy/Group: Individual Therapy  Mickel Fuchs 07/28/2021, 10:36 AM

## 2021-07-28 NOTE — Progress Notes (Signed)
Patient ID: Tony Young, male   DOB: 06-12-1955, 66 y.o.   MRN: [id number]  Met with pt to update regarding team conference slow progress this week. Pt aware he is going to surgery tomorrow and will stay on acute to be monitored. MD to call daughter to update her regarding this.

## 2021-07-28 NOTE — Progress Notes (Signed)
Inpatient Rehabilitation Care Coordinator ?Discharge Note  ? ?Patient Details  ?Name: Tony Young ?MRN: [id number] ?Date of Birth: [birth year] ? ? ?Discharge location: ACUTE FOR SURGERY ? ?Length of Stay: 14 DAYS  ? ?Discharge activity level: MAX-TOTAL ASSIST ? ?Home/community participation: ACTIVE ? ?Patient response LO:VFIEPP Literacy - How often do you need to have someone help you when you read instructions, pamphlets, or other written material from your doctor or pharmacy?: Always ? ?Patient response IR:JJOACZ Isolation - How often do you feel lonely or isolated from those around you?: Never ? ?Services provided included: MD, RD, PT, OT, SLP, RN, CM, TR, Pharmacy, SW ? ?Financial Services:  ?Charity fundraiser Utilized: Medicare ?  ? ?Choices offered to/list presented to:   ? ?Follow-up services arranged:  ?  ?   ?  ?  ?  ? ?Patient response to transportation need: ?Is the patient able to respond to transportation needs?: Yes ?In the past 12 months, has lack of transportation kept you from medical appointments or from getting medications?: No ?In the past 12 months, has lack of transportation kept you from meetings, work, or from getting things needed for daily living?: No ? ? ? ?Comments (or additional information): TRANSFERRING TO ACUTE FOR SURGERY WILL STAY ON ACUTE TO BE MONITORED.  ? ?Patient/Family verbalized understanding of follow-up arrangements:  Yes ? ?Individual responsible for coordination of the follow-up plan: PHOEBE-DAUGHTER ? ?Confirmed correct DME delivered: Elease Hashimoto 07/28/2021   ? ?Elease Hashimoto ?

## 2021-07-28 NOTE — Progress Notes (Signed)
Occupational Therapy Discharge Note  This patient was unable to complete the inpatient rehab program due to discharge to acute for surgical intervention for skin graft; therefore did not meet their long term goals. Pt left the program at a Max-total assist level for their  functional ADLs. This patient is being discharged from OT services at this time.  BIMS at time of d/c   Pt unable to complete due to medical status  See CareTool for functional status details.  If the patient is able to return to inpatient rehabilitation within 3 midnights, this may be considered an interrupted stay and therapy services will resume as ordered. Modification and reinstatement of their goals will be made upon completion of therapy service reevaluations.

## 2021-07-28 NOTE — Progress Notes (Signed)
Direct admit from Rehab. Tony Young, with Hx CHF COPD CAD diabetes mellitus who came a hospital in [place] because of a diagnosis of necrotizing fasciitis of left lower extremity.  Patient underwent fasciotomy and IV antibiotics however had significant complications with development of acute renal failure respiratory failure sepsis and shock.  The patient ended up on mechanical ventilation and s/p trac and PEG and transferred to Rehab unit. Now patient has been off ventilator and can eat mechanical soft diet. Plastic surgeon plans for I&D of the left leg tomorrow and requested that patient come to medical floor. Accepted to medsurg. 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

## 2021-07-28 NOTE — Progress Notes (Signed)
Speech Language Pathology Weekly Progress and Session Note  Patient Details  Name: Tony Young MRN: [id number] Date of Birth: 15-Nov-1955  Beginning of progress report period: July 22, 2021 End of progress report period: July 28, 2021  Today's Date: 07/28/2021 SLP Individual Time: 9470-9628 SLP Individual Time Calculation (min): 45 min  Short Term Goals: Week 2: SLP Short Term Goal 1 (Week 2): Pt will consumed thin liquids (cup or straw) with minimal overt s/s aspiration and supervision a verbal cues for use of small sips. SLP Short Term Goal 1 - Progress (Week 2): Met SLP Short Term Goal 2 (Week 2): Patient will consume dys 3 trials with effective mastication, minimal oral residuals, and without overt s/sx of aspiration with min A verbal cues to implement safe swallow precautions and strategies prior to diet advancement SLP Short Term Goal 2 - Progress (Week 2): Met SLP Short Term Goal 3 (Week 2): Patient will utilize external/internal memory aids to recall important information with minA. SLP Short Term Goal 3 - Progress (Week 2): Met  New Short Term Goals: Week 3: SLP Short Term Goal 1 (Week 3): Patient will consume current diet with effective mastication, oral clearance, and without overt s/sx of aspiration with sup A cues for safe swallowing precautions. SLP Short Term Goal 2 (Week 3): Patient will utilize external/internal memory aids to recall important information with supA.  Weekly Progress Updates: Patient has made functional progress and has met 3 of 3 STGs this reporting period. Participation has been limited this reporting period however secondary to fatigue and being off unit for other procedure. Patient continues to tolerate PMSV during all waking hours with vitals WFL. SLP continuing to address cognitive and swallow goals. Currently, patient demonstrates improved swallow function and was advanced to dysphagia 3 textures and thin liquids with sup A for safe  swallowing strategies/precautions. Patient currently utilizes internal/external memory strategies with min A cues. Family was present and supported patient is near baseline in regards to cognition, with the exception of acute changes with short-term recall on occasion and orientation. Patient and family education is ongoing. Patient would benefit from continued skilled SLP intervention to further assess dys 3/thin liquid tolerance, maximize use of compensatory memory strategies, and overall functional independence prior to discharge.   Intensity: Minumum of 1-2 x/day, 30 to 90 minutes Frequency: 1 to 3 out of 7 days Duration/Length of Stay: 4 weeks Treatment/Interventions: Functional tasks;Patient/family education;Dysphagia/aspiration precaution training;Therapeutic Activities;Therapeutic Exercise;Internal/external aids;Cognitive remediation/compensation  Daily Session Skilled Therapeutic Interventions: Skilled ST treatment focused on swallowing and cognitive goals. Pt's PMSV was in place and all vital signs WFL during treatment session. Pt seen for therapeutic PO trials of dys 3 textures. SLP facilitated repositioning and elevated HOB for PO consumption. Pt complained often of discomfort on buttocks and benefited from frequent repositioning for relief. Pt exhibited poor frustration tolerance throughout session but cooperative. Patient consumed dys 3 textures with mildly prolonged yet effective mastication, trace oral residuals, and no overt s/sx of aspiration. Pt reports mastication feels consistent with baseline while considering edentulous status. Pt consumed sequential sips of thin liquid by straw without overt s/sx of aspiration. Recommend diet advancement to dys 3 textures, thin liquids. Cont full supervision and feeding assistance. Pt was educated and verbalized agreement with diet advancement. Patient required min A for recall of compensatory memory strategies which increased to sup level when given  field of choices. Suspect decreased interest, fidgeting, frustration tolerance contributed. Patient was left in bed with alarm activated and immediate needs  within reach at end of session. Continue per current plan of care.     General    Pain Pain Assessment Pain Score: 3 Pain Type: Acute pain Pain Location: Buttocks  Therapy/Group: Individual Therapy  Patty Sermons 07/28/2021, 12:22 PM

## 2021-07-28 NOTE — Progress Notes (Signed)
Physical Therapy Note  Patient Details  Name: Tony Young MRN: [id number] Date of Birth: [birth year] Today's Date: 07/28/2021    Physical Therapy Discharge Note  This patient was unable to complete the inpatient rehab program due to returning to acute for surgery; therefore did not meet their long term goals. Pt left the program at a total assist level for their functional mobility/ transfers. This patient is being discharged from PT services at this time.  Pt's perception of pain in the last five days was unable to answer at this time.    See CareTool for functional status details  If the patient is able to return to inpatient rehabilitation within 3 midnights, this may be considered an interrupted stay and therapy services will resume as ordered. Modification and reinstatement of their goals will be made upon completion of therapy service reevaluations.     Mickel Fuchs 07/28/2021, 4:10 PM

## 2021-07-28 NOTE — Progress Notes (Signed)
PROGRESS NOTE   Subjective/Complaints:   Pt reports he doesn't want "something"- it sounded like trach/PMV, however maybe he meant the O2 that he didn't have on- just woke up and seemed slightly confused- resolved as woke up more.  Thought today was Wednesday.  Hasn't had BM in 4 days per nursing-   Refusing O2 at night for humidification - "doesn't need it".   ROS:  Pt denies SOB, abd pain, CP, N/V/(+)C/D, and vision changes      Objective:   No results found. Recent Labs    07/27/21 0722 07/28/21 0503  WBC 2.9* 3.3*  HGB 8.8* 9.2*  HCT 28.7* 30.0*  PLT 136* 149*    Recent Labs    07/27/21 0722  NA 135  K 3.1*  CL 99  CO2 26  GLUCOSE 95  BUN 17  CREATININE 1.31*  CALCIUM 8.2*     Intake/Output Summary (Last 24 hours) at 07/28/2021 3845 Last data filed at 07/28/2021 3646 Gross per 24 hour  Intake 967.45 ml  Output 1500 ml  Net -532.55 ml     Pressure Injury 07/15/21 Buttocks Left Unstageable - Full thickness tissue loss in which the base of the injury is covered by slough (yellow, tan, gray, green or brown) and/or eschar (tan, brown or black) in the wound bed. (Active)  07/15/21 1520  Location: Buttocks  Location Orientation: Left  Staging: Unstageable - Full thickness tissue loss in which the base of the injury is covered by slough (yellow, tan, gray, green or brown) and/or eschar (tan, brown or black) in the wound bed.  Wound Description (Comments):   Present on Admission: Yes    Physical Exam: Vital Signs Blood pressure 104/60, pulse (!) 117, temperature 98.3 F (36.8 C), resp. rate 18, height 5\' 7"  (1.702 m), weight 112.9 kg, SpO2 94 %.     General: awake, alert, appropriate, NAD HENT: conjugate gaze; oropharynx moist CV: tachycardic in 110s/ sounds like regular rhythm; no JVD Pulmonary: no W/R/R_ good air movement B/L  GI: soft, NT, somewhat distended; hypoactive BS Psychiatric:  appropriate- sleepy Neurological: alert, but slightly confused on day and O2? This AM- resolved after there a few minutes  Skin-    Mild dysarthria RUE weakness   Assessment/Plan: 1. Functional deficits which require 3+ hours per day of interdisciplinary therapy in a comprehensive inpatient rehab setting. Physiatrist is providing close team supervision and 24 hour management of active medical problems listed below. Physiatrist and rehab team continue to assess barriers to discharge/monitor patient progress toward functional and medical goals  Care Tool:  Bathing    Body parts bathed by patient: Face   Body parts bathed by helper: Right arm, Left arm, Chest, Abdomen, Front perineal area, Buttocks, Right upper leg, Left upper leg, Right lower leg, Left lower leg, Face Body parts n/a: Left lower leg   Bathing assist Assist Level: 2 Helpers     Upper Body Dressing/Undressing Upper body dressing   What is the patient wearing?: Hospital gown only    Upper body assist Assist Level: Total Assistance - Patient < 25%    Lower Body Dressing/Undressing Lower body dressing      What is  the patient wearing?: Pants, Underwear/pull up     Lower body assist Assist for lower body dressing: 2 Helpers     Toileting Toileting    Toileting assist Assist for toileting: 2 Helpers     Transfers Chair/bed transfer  Transfers assist  Chair/bed transfer activity did not occur: Safety/medical concerns        Locomotion Ambulation   Ambulation assist   Ambulation activity did not occur: Safety/medical concerns          Walk 10 feet activity   Assist  Walk 10 feet activity did not occur: Safety/medical concerns        Walk 50 feet activity   Assist Walk 50 feet with 2 turns activity did not occur: Safety/medical concerns         Walk 150 feet activity   Assist Walk 150 feet activity did not occur: Safety/medical concerns         Walk 10 feet on uneven  surface  activity   Assist Walk 10 feet on uneven surfaces activity did not occur: Safety/medical concerns         Wheelchair     Assist Is the patient using a wheelchair?: Yes Type of Wheelchair: Manual Wheelchair activity did not occur: Safety/medical concerns         Wheelchair 50 feet with 2 turns activity    Assist    Wheelchair 50 feet with 2 turns activity did not occur: Safety/medical concerns       Wheelchair 150 feet activity     Assist  Wheelchair 150 feet activity did not occur: Safety/medical concerns       Blood pressure 104/60, pulse (!) 117, temperature 98.3 F (36.8 C), resp. rate 18, height 5\' 7"  (1.702 m), weight 112.9 kg, SpO2 94 %.  Medical Problem List and Plan: 1. 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    Con't CIR_ PT and OT and SLP- team conference to go over plan for d/c/possible readmit 2.  Antithrombotics: -DVT/anticoagulation:  Mechanical: Sequential compression devices, below knee Right lower extremity --high risk for DVTs due to body habitus and immobility. Will check dopplers in am. 2/17- pt has extensive DVTs in RLE and some in LLE- as above- placed on tx dose lovenox   2/20- Plts low 100s, however Hb stable- con't regimen- cannot place on DOAC_ will need Warfarin when change over per pharmacy 2/22- wait to change due to surgical intervention planned.  2/23- d/w Plastic-s will cont lovenox until 1-2 days before surgery then change to heparin drip- will need to go back to acute for skin graft on 3/1 2/27- will change to Heparin gtt due to surgery Wednesday 2/28- on Heparin gtt for surgery             -antiplatelet therapy: N/A 3. Pain: continue oxycodone prn. 2/16- main pain is R shoulder- con't regimen prn 2/22- L knee looks like swelling/effusion-  however likely a strain- con't oxy prnand added tramadol TID  2/27- pain usually controlled- con't regimen 4. Mood: LCSW to follow for evaluation and support.              -antipsychotic agents:  Risperdal bid 5. Neuropsych: This patient is capable of making decisions on his own behalf. 6. Skin/Wound Care: Air mattress due to sacral decub  and necrotizing fasciitis LLE-              --continue vitamins and supplements to promote wound healing.  7. Fluids/Electrolytes/Nutrition:  Strict I/O as continues to show signs of overload/anasarca.             --low salt diet. Continue Juven to help with protein stores.  2/22- will d/w nutrition if needs Tfs anymore- eating meals?- calorie count being finished today.   2/24- hard ot figure out nutrition since been NPO for 3 days now 8. Strep pyogenes bacteremia/Necrotizing fascitis LLE s/p I & D: Continue daily dressing changes.              --maintain adequate nutritional status to help promote healing. 9. Acute  reps failure s/p trach- and COPD: Respiratory status stable. Continue steriod taper  2/16- con't PMV and SLP for swallowing/speech/phonation  2/20- talking with Resp therapy if can downsize trach- I think we likely can to #4, however will need to discuss directly with Resp therapy.   2/27- won't change right now because going to OR and it's better with #6 shiley.  10 OSA/OHS?/VDRF: Continue button plugging during the day and ATC at nights for humidification             --Started discussion that he will likely go home with trach.  11. H/o GIB: H/H improving. Continue PPI BID.              --no signs of bleeding. Will start low dose Lovenox if platelets stable.   Monitor CBC on anticoagulation  CBC Latest Ref Rng & Units 07/28/2021 07/27/2021 07/24/2021  WBC 4.0 - 10.5 K/uL 3.3(L) 2.9(L) 4.2  Hemoglobin 13.0 - 17.0 g/dL 9.2(L) 8.8(L) 8.6(L)  Hematocrit 39.0 - 52.0 % 30.0(L) 28.7(L) 27.0(L)  Platelets 150 - 400 K/uL 149(L) 136(L) 122(L)    2/24-  Hb stable- con't regimen 12. Thrombocytopenia: Platelets continue to fluctuate.   2/16- Plts up to 166k  2/20- Plts down to 111k- will recheck Thursday  2/27- Plts 136- slightly better 13.  T2DM with hyperglycemia:  Check Hgb A1c in am. Will monitor BS ac/hs with SSI for elevated BS             Add 2U Semglee HS             --continue tube feeds at nights (likely contributing to diarrhea)     2/17- Pt CBGs are running 200sto300s- will increase Semglee to 6 units nightly- from 2 units and monitor for trend CBG (last 3)  Recent Labs    07/27/21 1641 07/27/21 2109 07/28/21 0638  GLUCAP 238* 246* 96     2/28- con't Semglee 24 units- and SSI- also might need D5NS in Am since will be NPO for surgery and don't want him to drop.  14. R RTC injury- probably partial tear-   2/16- sustained in [facility]- will write note above HOB to remind staff to protect R shoulder.  15. Extensive B/L DVTs-   2/17- will check CBC this weekend, since recent hx of GI bleed. Don't want pt having PE due to trach and resp failure requiring trach.   2/23- Hb stable- however will con't lovenox until have ot switch to warfarin after surgery 16. Necrotizing fasciitis of LLE  2/21- looks less beefy and deeper red- concerning to me with more slough- called Gen surg, then ortho- they will look at pt, however might need Plastics--   2/22- plastics says pt ready for skin grafts-  could do 3/1- but I am concerned pt will need to leave rehab to get surgical intervention- still d/w plastics  2/23- will arrange to go back to acute on 3/1 after surgery  2/27- surgery Wednesday- will see if they can also debride Buttock pressure ulcer that's unstageable vs Stage IV 17.  Thrombocytopenia  Platelets 122 on 2/24  2/28- plts 149 18.  Chronic combined CHF  EF of 40-45%, continue meds  Appreciate cards recs 41. Constipation  2/28- will give sorbitol 30cc since been 4 days  I spent a total of 42    minutes on total care today- >50%  coordination of care- due to team conference and d/w nursing x2   LOS: 13 days A FACE TO FACE EVALUATION WAS PERFORMED  Tony Young 07/28/2021, 8:38 AM

## 2021-07-28 NOTE — Progress Notes (Signed)
°   07/27/21 1920  Therapy Vitals  Pulse Rate 100  Resp 18  Patient Position (if appropriate) Lying  MEWS Score/Color  MEWS Score 1  MEWS Score Color Green  Respiratory Assessment  Assessment Type Assess only  Respiratory Pattern Regular;Unlabored  Chest Assessment Chest expansion symmetrical  Cough Strong  Sputum Specimen Source  (refused suction)  Bilateral Breath Sounds Diminished  Oxygen Therapy/Pulse Ox  O2 Device Tracheostomy Collar  O2 Therapy Oxygen humidified  O2 Flow Rate (L/min) 5 L/min  FiO2 (%) 28 %  SpO2 94 %  Tracheostomy Shiley Flexible 6 mm Uncuffed  Placement Date/Time: 05/31/21 1000   Inserted prior to hospital arrival?: Placed at another facility  Placed By: Other (Comment)  Brand: Shiley Flexible  Size (mm): 6 mm  Style: Uncuffed  Status Secured;Passy Muir Speaking valve  Site Assessment Dry;Clean  Site Care Dressing applied;Dried;Cleansed;Protective barrier to skin  Inner Cannula Care Cleansed/dried;Changed/new  Ties Assessment Clean;Dry;Secure  Tracheostomy Equipment at bedside Yes and checklist posted at head of bed  Respiratory  Airway LDA Tracheostomy   Placed pt on humidity day shift order. Trach collar 5L 28%

## 2021-07-28 NOTE — Progress Notes (Signed)
ANTICOAGULATION CONSULT NOTE - follow-up  Pharmacy Consult for transition from Lovenox to Heparin infusion Indication: DVT  Allergies  Allergen Reactions   Statins Other (See Comments)    Made hands swell   Penicillins Nausea And Vomiting and Other (See Comments)    Other reaction(s): GI intolerance Other reaction(s): Nausea And Vomiting, Other (See Comments), Other (see comments) Other reaction(s): Other (See Comments) Other reaction(s): Nausea And Vomiting, Other (See Comments), Other (see comments) Other reaction(s): Nausea And Vomiting, Other (See Comments), Other (see comments)    Ropinirole Other (See Comments)    Patient Measurements: Height: 5\' 7"  (170.2 cm) Weight: 112.9 kg (248 lb 14.4 oz) IBW/kg (Calculated) : 66.1  Heparin Dosing Weight: 91.7 kg  Vital Signs: Temp: 98.3 F (36.8 C) (02/28 0409) BP: 114/62 (02/28 0930) Pulse Rate: 112 (02/28 1124)  Labs: Recent Labs    07/27/21 0722 07/28/21 0503 07/28/21 1147  HGB 8.8* 9.2*  --   HCT 28.7* 30.0*  --   PLT 136* 149*  --   APTT  --  79* 85*  HEPARINUNFRC  --  >1.10* >1.10*  CREATININE 1.31*  --   --      Estimated Creatinine Clearance: 67.4 mL/min (A) (by C-G formula based on SCr of 1.31 mg/dL (H)).   Medical History: Past Medical History:  Diagnosis Date   CAD (coronary artery disease)    Cardiomyopathy ([place])    Colon cancer (H[facility])    COPD (chronic obstructive pulmonary disease) (HCC)    OSA (obstructive sleep apnea)    Type 2 diabetes mellitus (HCC)     Medications:  Scheduled:   amantadine  50 mg Oral Daily   amiodarone  200 mg Oral BID   vitamin C  500 mg Oral BID   busPIRone  7.5 mg Oral BID   cholecalciferol  1,000 Units Oral Daily   ferrous sulfate  300 mg Per Tube BID WC   free water  50 mL Per Tube BID   furosemide  20 mg Oral BID   insulin aspart  0-15 Units Subcutaneous TID WC   insulin aspart  0-5 Units Subcutaneous QHS   insulin glargine-yfgn  24 Units Subcutaneous QHS    latanoprost  1 drop Both Eyes QHS   levothyroxine  75 mcg Oral Q0600   loratadine  10 mg Oral Daily   melatonin  3 mg Oral QHS   metoprolol tartrate  25 mg Oral BID   multivitamin with minerals  1 tablet Oral Daily   nutrition supplement (JUVEN)  1 packet Per Tube BID BM   pantoprazole sodium  40 mg Per Tube BID   polycarbophil  625 mg Oral BID   potassium chloride  40 mEq Oral BID   predniSONE  10 mg Oral Q breakfast   pregabalin  50 mg Oral Daily   primidone  50 mg Oral Daily   Ensure Max Protein  330 oz Oral QHS   risperiDONE  1 mg Oral BID   sertraline  100 mg Oral QHS   sodium hypochlorite   Topical BID   traMADol  50 mg Oral Q6H   Infusions:   heparin 1,500 Units/hr (07/28/21 1055)    Assessment: 66 year-old male patient admitted to CIR due to functional deficit due to critical illness myopathy. He was diagnosed with an age indeterminate DVT (left femoral vein,, and SF junction). He was started on therapeutic lovenox at 120 mg [place] q12h. Patient is scheduled to go to the OR with plastic surgery  on 07/29/2021 requiring transition to a heparin infusion.  Goal of Therapy:  Heparin level 0.3-0.7 units/ml aPTT 66-102 seconds Monitor platelets by anticoagulation protocol: Yes   Plan:  aPTT: 85 seconds Heparin level: >1.10 Continue heparin infusion at 1500 units/hr. Heparin level is elevated and likely due to influence of Lovenox.  aPTT is within the therapeutic range (66-102 seconds)  Continue heparin infusion at 1500 units/hr. Check heparin level and aPTT daily while on heparin (AM labs 07/29/2021) Continue to monitor H&H and platelets  Cristalle Rohm BS, PharmD, BCPS Clinical Pharmacist 07/28/2021 12:44 PM

## 2021-07-28 NOTE — Progress Notes (Signed)
Note Jardiance added to help with cardioprotective measures. Discussed with pharmacy-->will hold this till LLE wound (necrotizing fasciitis) and sacral decub fully healed and to follow up with cards for input on risks v/s benefits.

## 2021-07-28 NOTE — Progress Notes (Signed)
[place] for transition from Lovenox to Heparin infusion Indication: DVT  Allergies  Allergen Reactions   Statins Other (See Comments)    Made hands swell   Penicillins Nausea And Vomiting and Other (See Comments)    Other reaction(s): GI intolerance Other reaction(s): Nausea And Vomiting, Other (See Comments), Other (see comments) Other reaction(s): Other (See Comments) Other reaction(s): Nausea And Vomiting, Other (See Comments), Other (see comments) Other reaction(s): Nausea And Vomiting, Other (See Comments), Other (see comments)    Ropinirole Other (See Comments)    Patient Measurements: Height: 5\' 7"  (170.2 cm) Weight: 112.9 kg (248 lb 14.4 oz) IBW/kg (Calculated) : 66.1  Heparin Dosing Weight: 91.7 kg  Vital Signs: Temp: 98.3 F (36.8 C) (02/28 0409) BP: 104/60 (02/28 0409) Pulse Rate: 100 (02/28 0409)  Labs: Recent Labs    07/27/21 0722 07/28/21 0503  HGB 8.8* 9.2*  HCT 28.7* 30.0*  PLT 136* 149*  APTT  --  79*  HEPARINUNFRC  --  >1.10*  CREATININE 1.31*  --      Estimated Creatinine Clearance: 67.4 mL/min (A) (by C-G formula based on SCr of 1.31 mg/dL (H)).   Medical History: Past Medical History:  Diagnosis Date   CAD (coronary artery disease)    Cardiomyopathy ([place])    Colon cancer (HCC)    COPD (chronic obstructive pulmonary disease) (HCC)    OSA (obstructive sleep apnea)    Type 2 diabetes mellitus ([facility])     Medications:  Scheduled:   amantadine  50 mg Oral Daily   amiodarone  200 mg Oral BID   vitamin C  500 mg Oral BID   busPIRone  7.5 mg Oral BID   cholecalciferol  1,000 Units Oral Daily   collagenase   Topical Daily   empagliflozin  10 mg Oral Daily   feeding supplement  237 mL Oral BID BM   ferrous sulfate  300 mg Per Tube BID WC   free water  50 mL Per Tube BID   furosemide  20 mg Oral BID   insulin aspart  0-15 Units Subcutaneous TID WC   insulin aspart  0-5 Units Subcutaneous QHS    insulin glargine-yfgn  24 Units Subcutaneous QHS   latanoprost  1 drop Both Eyes QHS   levothyroxine  75 mcg Oral Q0600   loratadine  10 mg Oral Daily   melatonin  3 mg Oral QHS   metoprolol tartrate  25 mg Oral BID   multivitamin with minerals  1 tablet Oral Daily   nutrition supplement (JUVEN)  1 packet Per Tube BID BM   pantoprazole sodium  40 mg Per Tube BID   polycarbophil  625 mg Oral BID   potassium chloride  40 mEq Oral BID   predniSONE  10 mg Oral Q breakfast   pregabalin  50 mg Oral Daily   primidone  50 mg Oral Daily   risperiDONE  1 mg Oral BID   sertraline  100 mg Oral QHS   sodium hypochlorite   Topical BID   traMADol  50 mg Oral Q6H   zinc sulfate  220 mg Oral Daily   Infusions:   heparin 1,500 Units/hr (07/27/21 1829)    Assessment: 66 year-old male patient admitted to CIR due to functional deficit due to critical illness myopathy. He was diagnosed with an age indeterminate DVT (left femoral vein,, and SF junction). He was started on therapeutic lovenox at 120 mg [place] q12h. Patient is scheduled to  go to the OR with plastic surgery on 07/29/2021 requiring transition to a heparin infusion.  2/28 AM update:  Anti-Xa level of >1.1 likely still being influenced by Lovenox  aPTT is ok at 79  Goal of Therapy:  Heparin level 0.3-0.7 units/ml Monitor platelets by anticoagulation protocol: Yes   Plan:  Cont heparin at 1500 units/hr 1400 aPTT and anti-Xa level   Narda Bonds, PharmD, BCPS Clinical Pharmacist Phone: [phone]

## 2021-07-28 NOTE — Discharge Summary (Signed)
Physician Discharge Summary  Patient ID: Tarique Loveall MRN: [id number] DOB/AGE: [birth year] 66 y.o.  Admit date: 07/15/2021 Discharge date: 07/29/2021  Discharge Diagnoses:  Principal Problem:   Critical illness myopathy Active Problems:   Tracheostomy status (HCC)   AF (paroxysmal atrial fibrillation) (HCC)   Chronic combined systolic and diastolic CHF (congestive heart failure) (HCC)   Thrombocytopenia (HCC)   Controlled type 2 diabetes mellitus with hyperglycemia, with long-term current use of insulin (HCC)   Atrial flutter (HCC)   OSA (obstructive sleep apnea)   Dvt femoral (deep venous thrombosis) (HCC)   Obesity hypoventilation syndrome (HCC)   Necrotizing fasciitis of lower leg (HCC)   Left knee pain   Neutropenia (HCC)   Discharged Condition: stable   Significant Diagnostic Studies: DG Knee 1-2 Views Left  Result Date: 07/21/2021 CLINICAL DATA:  Left knee pain after attempting to walk in rehab. History of fall. EXAM: LEFT KNEE - 1-2 VIEW COMPARISON:  None. FINDINGS: Small knee joint effusion. No fracture or dislocation. No evidence of lipohemarthrosis. 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 No evidence of chondrocalcinosis. Scattered adjacent vascular calcifications. No radiopaque foreign body. IMPRESSION: 1. Small knee joint effusion without associated fracture or lipohemarthrosis. 2. 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 Electronically Signed   By: Sandi Mariscal M.D.   On: 07/21/2021 16:56   NM Myocar Multi W/Spect W/Wall Motion / EF  Result Date: 07/22/2021   Findings are equivocal.   There is infiltration of the IV with extravasation of the myoview into the patients arm during the stress images.   The study in uninterpretable .   IR Removal Tun Cv Cath W/O FL  Result Date:  07/01/2021 INDICATION: History of sepsis with acute kidney injury. Recovery of renal function and now requesting removal of tunneled hemodialysis catheter. The catheter was placed at outside facility. EXAM: REMOVAL OF TUNNELED HEMODIALYSIS CATHETER MEDICATIONS: 1% lidocaine with epinephrine 3 mL COMPLICATIONS: None immediate. PROCEDURE: Informed written consent was obtained from the patient following an explanation of the procedure, risks, benefits and alternatives to treatment. A time out was performed prior to the initiation of the procedure. Sterile technique was utilized including mask, sterile gloves, sterile drape, and hand hygiene. Betadine was used to prep the patient's right neck, chest and existing catheter. 1% lidocaine with epinephrine was injected around the catheter and the subcutaneous tunnel. Using only gentle traction the catheter was removed intact. Hemostasis was obtained with manual compression. A dressing was placed. The patient tolerated the procedure well without immediate post procedural complication. IMPRESSION: Successful removal of tunneled dialysis catheter. Read by: Gareth Eagle, PA-C Electronically Signed   By: Ruthann Cancer M.D.   On: 07/01/2021 17:18   DG Chest Port 1 View  Result Date: 07/15/2021 CLINICAL DATA:  66 year old male with acute on chronic respiratory failure. EXAM: PORTABLE CHEST 1 VIEW COMPARISON:  Portable chest 07/06/2021 and earlier. FINDINGS: Portable AP semi upright view at 0630 hours. Satisfactory tracheostomy. Stable mild cardiomegaly. Other mediastinal contours are within normal limits. Improved lung volumes. Allowing for portable technique the lungs are clear. No pneumothorax or pleural effusion. No acute osseous abnormality identified. Paucity of bowel gas in the upper abdomen. IMPRESSION: Satisfactory tracheostomy.  No acute cardiopulmonary abnormality. Electronically Signed   By: Genevie Ann M.D.   On: 07/15/2021 07:21   DG Chest Port 1 View  Result Date:  07/06/2021 CLINICAL  DATA:  66 year old male with tracheostomy. Removed dialysis catheter. EXAM: PORTABLE CHEST 1 VIEW COMPARISON:  Portable chest 07/01/2021 and earlier. FINDINGS: Portable AP semi upright view at 0540 hours. Right chest dual lumen catheter removed. Tracheostomy remains in place, stable. Mildly lower lung volumes. Stable cardiac size and mediastinal contours. No pneumothorax, pleural effusion, consolidation, or pleural effusion. Mild perihilar atelectasis suspected and stable. No new pulmonary opacity. No acute osseous abnormality identified. Paucity of bowel gas in the visible upper abdomen. IMPRESSION: 1. Right chest dialysis catheter removed. 2. No acute cardiopulmonary abnormality.  Stable mild atelectasis. Electronically Signed   By: Genevie Ann M.D.   On: 07/06/2021 06:08   DG CHEST PORT 1 VIEW  Result Date: 07/01/2021 CLINICAL DATA:  Pneumonia.  Tracheostomy tube dependent. EXAM: PORTABLE CHEST 1 VIEW COMPARISON:  06/28/2021 FINDINGS: 0534 hours. Tracheostomy tube remains in place. Right IJ central line again noted. Vascular congestion seen previously has decreased in the interval. The cardio pericardial silhouette is enlarged. Telemetry leads overlie the chest. IMPRESSION: Interval improvement in vascular congestion/interstitial edema. Electronically Signed   By: Misty Stanley M.D.   On: 07/01/2021 06:05   ECHOCARDIOGRAM COMPLETE  Result Date: 07/22/2021    ECHOCARDIOGRAM REPORT   Patient Name:   NDREW CREASON [facility] Date of Exam: 07/22/2021 Medical Rec #:  [id number]              Height:       67.0 in Accession #:    [id number]             Weight:       248.0 lb Date of Birth:  08-19-1955              BSA:          2.216 m Patient Age:    66 years               BP:           116/60 mmHg Patient Gender: M                      HR:           112 bpm. Exam Location:  Inpatient Procedure: 2D Echo, Cardiac Doppler and Color Doppler Indications:    I50.21 CHF  History:        Patient has no prior  history of Echocardiogram examinations.                 Cardiomyopathy, CAD, COPD; Risk Factors:Diabetes.  Sonographer:    Beryle Beams Referring Phys: 72 RHONDA G BARRETT  Sonographer Comments: Patient is morbidly obese. IMPRESSIONS  1. Left ventricular ejection fraction, by estimation, is 40 to 45%. The left ventricle has mildly decreased function. Left ventricular endocardial border not optimally defined to evaluate regional wall motion. There is mild left ventricular hypertrophy.  Left ventricular diastolic parameters are indeterminate.  2. Right ventricular systolic function is normal. The right ventricular size is normal.  3. The mitral valve is normal in structure. No evidence of mitral valve regurgitation. No evidence of mitral stenosis.  4. The aortic valve is calcified. There is mild calcification of the aortic valve. There is mild thickening of the aortic valve. Aortic valve regurgitation is not visualized. Aortic valve sclerosis is present, with no evidence of aortic valve stenosis.  5. The inferior vena cava is normal in size with greater than 50% respiratory variability, suggesting right atrial pressure of 3 mmHg. FINDINGS  Left Ventricle:  Left ventricular ejection fraction, by estimation, is 40 to 45%. The left ventricle has mildly decreased function. Left ventricular endocardial border not optimally defined to evaluate regional wall motion. The left ventricular internal cavity size was normal in size. There is mild left ventricular hypertrophy. Left ventricular diastolic parameters are indeterminate. Right Ventricle: The right ventricular size is normal. No increase in right ventricular wall thickness. Right ventricular systolic function is normal. Left Atrium: Left atrial size was normal in size. Right Atrium: Right atrial size was normal in size. Pericardium: There is no evidence of pericardial effusion. Mitral Valve: The mitral valve is normal in structure. No evidence of mitral valve  regurgitation. No evidence of mitral valve stenosis. Tricuspid Valve: The tricuspid valve is normal in structure. Tricuspid valve regurgitation is not demonstrated. No evidence of tricuspid stenosis. Aortic Valve: The aortic valve is calcified. There is mild calcification of the aortic valve. There is mild thickening of the aortic valve. Aortic valve regurgitation is not visualized. Aortic valve sclerosis is present, with no evidence of aortic valve stenosis. Aortic valve mean gradient measures 3.0 mmHg. Aortic valve peak gradient measures 5.3 mmHg. Aortic valve area, by VTI measures 1.96 cm. Pulmonic Valve: The pulmonic valve was normal in structure. Pulmonic valve regurgitation is not visualized. No evidence of pulmonic stenosis. Aorta: The aortic root is normal in size and structure. Venous: The inferior vena cava is normal in size with greater than 50% respiratory variability, suggesting right atrial pressure of 3 mmHg. IAS/Shunts: No atrial level shunt detected by color flow Doppler.  LEFT VENTRICLE PLAX 2D LVIDd:         5.00 cm LVIDs:         4.30 cm LV PW:         1.40 cm LV IVS:        1.00 cm LVOT diam:     2.20 cm LV SV:         41 LV SV Index:   18 LVOT Area:     3.80 cm  LV Volumes (MOD) LV vol d, MOD A2C: 88.5 ml LV vol d, MOD A4C: 102.0 ml LV vol s, MOD A2C: 43.1 ml LV vol s, MOD A4C: 56.4 ml LV SV MOD A2C:     45.4 ml LV SV MOD A4C:     102.0 ml LV SV MOD BP:      45.9 ml RIGHT VENTRICLE             IVC RV S prime:     10.30 cm/s  IVC diam: 2.00 cm TAPSE (M-mode): 1.8 cm LEFT ATRIUM             Index        RIGHT ATRIUM           Index LA diam:        4.00 cm 1.81 cm/m   RA Area:     17.20 cm LA Vol (A2C):   48.0 ml 21.66 ml/m  RA Volume:   51.90 ml  23.42 ml/m LA Vol (A4C):   62.9 ml 28.39 ml/m LA Biplane Vol: 55.9 ml 25.23 ml/m  AORTIC VALVE                    PULMONIC VALVE AV Area (Vmax):    2.61 cm     PV Vmax:       0.70 m/s AV Area (Vmean):   2.36 cm     PV Vmean:  47.000 cm/s AV  Area (VTI):     1.96 cm     PV VTI:        0.131 m AV Vmax:           115.00 cm/s  PV Peak grad:  1.9 mmHg AV Vmean:          82.900 cm/s  PV Mean grad:  1.0 mmHg AV VTI:            0.208 m AV Peak Grad:      5.3 mmHg AV Mean Grad:      3.0 mmHg LVOT Vmax:         79.10 cm/s LVOT Vmean:        51.400 cm/s LVOT VTI:          0.107 m LVOT/AV VTI ratio: 0.52  AORTA Ao Root diam: 2.60 cm Ao Asc diam:  2.10 cm  SHUNTS Systemic VTI:  0.11 m Systemic Diam: 2.20 cm Candee Furbish MD Electronically signed by Candee Furbish MD Signature Date/Time: 07/22/2021/11:05:23 AM    Final    ECHOCARDIOGRAM STRESS TEST  Result Date: 07/24/2021     DOBUTAMINE STRESS ECHO REPORT    -------------------------------------------------------------------------------- Patient Name:   FATE CASTER [facility] Date of Exam: 07/24/2021 Medical Rec #:  [id number]              Height:       67.0 in Accession #:    [id number]             Weight:       248.9 lb Date of Birth:  1955/12/05              BSA:          2.219 m Patient Age:    54 years               BP:           103/63 mmHg Patient Gender: M                      HR:           109 bpm. Exam Location:  Inpatient Procedure: Dobutamine Stress Indications:    pre-operative evaluation  History:        Patient has prior history of Echocardiogram examinations, most                 recent 07/22/2021.  Sonographer:    Johny Chess RDCS Referring Phys: [id number] [place]  1. This is a negative stress echocardiogram for ischemia.  2. This is a low risk study. FINDINGS Exam Protocol: Definity contrast agent was given IV to delineate the left ventricular endocardial borders.  Patient Performance: The baseline heart rate was 93 bpm. The heart rate at peak stress was 131 bpm. The target heart rate was calculated to be 131 bpm. The percentage of maximum predicted heart rate achieved was 84.9 %. The baseline blood pressure was 103/63 mmHg. The blood pressure at peak stress was 103/63 mmHg.  The patient developed no symptoms during the stress exam.  EKG: Resting EKG showed atrial fibrillation. The patient developed no abnormal EKG findings during exercise.  2D Echo Findings: The baseline ejection fraction was 60%. The peak ejection fraction at stress was 80%. Baseline regional wall motion abnormalities were not present. There were no stress-induced wall motion abnormalities. This is a negative stress echocardiogram for ischemia.  Candee Furbish MD Electronically signed on 07/24/2021 at  3:44:31 PM    Final    CT VENOGRAM ABD/PEL  Result Date: 07/16/2021 CLINICAL DATA:  Deep venous thrombosis (DVT) EXAM: CT VENOGRAM ABDOMEN AND PELVIS WITH CONTRAST TECHNIQUE: Multidetector CT imaging of the abdomen and pelvis was performed using the standard protocol following bolus administration of intravenous contrast. RADIATION DOSE REDUCTION: This exam was performed according to the departmental dose-optimization program which includes automated exposure control, adjustment of the mA and/or kV according to patient size and/or use of iterative reconstruction technique. CONTRAST:  116mL OMNIPAQUE IOHEXOL 350 MG/ML SOLN COMPARISON:  Chest XR, 07/15/2021. FINDINGS: Suboptimal evaluation, secondary to poor contrast opacification such that nonocclusive thrombus could missed. Lower chest: Small volume RIGHT pleural effusion. Cardiomegaly with multivessel coronary atherosclerosis. Hepatobiliary: Mild nodularity of liver contour, suspicious for cirrhosis. No focal liver lesion is seen. No gallstones, gallbladder wall thickening, or biliary dilatation. Pancreas: No pancreatic ductal dilatation or surrounding inflammatory changes. Spleen: Normal in size without focal abnormality. Perihilar accessory spleen. Adrenals/Urinary Tract: Adrenal glands are unremarkable. Kidneys are normal, without renal calculi, focal lesion, or hydronephrosis. Mild distention of the urinary bladder. Anterior non dependent intraluminal gas, within  urinary bladder, correlate for recent catheterization. Stomach/Bowel: Gastrostomy, well-positioned at the antral stomach. Stomach is otherwise within normal limits. Nonobstructed small bowel. Appendix is not definitively visualized. Nondilated colon. Postsurgical changes of LEFT colectomy with intact-appearing anastomosis. No evidence of bowel wall thickening, distention, or inflammatory changes. Vascular/Lymphatic: Aortic atherosclerosis without aneurysmal dilation. No enlarged abdominal or pelvic lymph nodes. *Venous expansion and appearance of filling defect involving the RIGHT SFV, profunda vein and CFV. *No evidence of thrombus extension into the RIGHT EIV. *Suspect additional venous expansion and filling defect involving the LEFT CFV. See key image. *No evidence of filling defect within the IVC or visualized portions of the pelvic veins. Reproductive: Prostate is unremarkable. Other: Gynecomastia. Anterior abdominal wall contusions, likely injection sites. No abdominal wall hernia. No abdominopelvic ascites. Musculoskeletal: No acute osseous findings. IMPRESSION: Suboptimal evaluation, within these constraints; 1. RIGHT lower extremity DVT, involving the RIGHT femoral vein, profunda and CFV. 2. Suspected additional LEFT lower extremity DVT, involving the LEFT CFV. 3. No CTV evidence of thrombus extension into the pelvic veins. These results will be called to the ordering clinician or representative by the Radiologist Assistant, and communication documented in the PACS or [employer]. Michaelle Birks, MD [facility] Electronically Signed   By: Michaelle Birks M.D.   On: 07/16/2021 16:17   VAS [place] LOWER EXTREMITY VENOUS (DVT)  Result Date: 07/16/2021  Lower Venous DVT Study Patient Name:  CAIN FITZHENRY [facility]  Date of Exam:   07/16/2021 Medical Rec #: [id number]               Accession #:    [id number] Date of Birth: [birth year]               Patient  Gender: M Patient Age:   52 years Exam Location:  [facility] Procedure:      VAS [place] LOWER EXTREMITY VENOUS (DVT) Referring Phys: Chazz Philson --------------------------------------------------------------------------------  Indications: Immobility.  Limitations: Body habitus, poor ultrasound/tissue interface and left calf bandages. Performing Technologist: Archie Patten RVS  Examination Guidelines: A complete evaluation includes B-mode imaging, spectral Doppler, color Doppler, and power Doppler as needed of all accessible portions of each vessel. Bilateral testing is considered an integral part of a complete examination. Limited examinations for reoccurring indications may be performed as noted. The reflux portion of the exam is  performed with the patient in reverse Trendelenburg.  +---------+---------------+---------+-----------+----------+-------------------+  RIGHT     Compressibility Phasicity Spontaneity Properties Thrombus Aging       +---------+---------------+---------+-----------+----------+-------------------+  CFV       None            No        No                     Age Indeterminate    +---------+---------------+---------+-----------+----------+-------------------+  SFJ       None                                             Age Indeterminate    +---------+---------------+---------+-----------+----------+-------------------+  FV Prox   None                                             Age Indeterminate    +---------+---------------+---------+-----------+----------+-------------------+  FV Mid    None                                             Age Indeterminate    +---------+---------------+---------+-----------+----------+-------------------+  FV Distal None                                             Age Indeterminate    +---------+---------------+---------+-----------+----------+-------------------+  PFV       None                                             Age Indeterminate     +---------+---------------+---------+-----------+----------+-------------------+  POP       None            No        No                                          +---------+---------------+---------+-----------+----------+-------------------+  PTV       None                                                                  +---------+---------------+---------+-----------+----------+-------------------+  PERO                                                       Not well visualized  +---------+---------------+---------+-----------+----------+-------------------+  EIV  patent with color                                                                doppler              +---------+---------------+---------+-----------+----------+-------------------+   +---------+---------------+---------+-----------+----------+-------------------+  LEFT      Compressibility Phasicity Spontaneity Properties Thrombus Aging       +---------+---------------+---------+-----------+----------+-------------------+  CFV       None            Yes       Yes                    Age Indeterminate    +---------+---------------+---------+-----------+----------+-------------------+  SFJ       None                                             Age Indeterminate    +---------+---------------+---------+-----------+----------+-------------------+  FV Prox   Full                                                                  +---------+---------------+---------+-----------+----------+-------------------+  FV Mid    Full            Yes       Yes                                         +---------+---------------+---------+-----------+----------+-------------------+  FV Distal Full            Yes       Yes                                         +---------+---------------+---------+-----------+----------+-------------------+  PFV       Full                                                                   +---------+---------------+---------+-----------+----------+-------------------+  POP       Full            Yes       Yes                                         +---------+---------------+---------+-----------+----------+-------------------+  PTV  not visualized due                                                               to bandages          +---------+---------------+---------+-----------+----------+-------------------+  PERO                                                       not visualized due                                                               to bandages          +---------+---------------+---------+-----------+----------+-------------------+  EIV                                                        patent with color                                                                doppler              +---------+---------------+---------+-----------+----------+-------------------+     Summary: RIGHT: - Findings consistent with age indeterminate deep vein thrombosis involving the right common femoral vein, SF junction, right femoral vein, right proximal profunda vein, right popliteal vein, and right posterior tibial veins. - No cystic structure found in the popliteal fossa.  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 - No cystic structure found in the popliteal fossa.  *See table(s) above for measurements and observations. Electronically signed by Jamelle Haring on 07/16/2021 at 2:29:30 PM.    Final     Labs:  Basic Metabolic Panel: Recent Labs  Lab 07/24/21 0248 07/27/21 0722 07/29/21 0618  NA 133* 135 136  K 4.6 3.1* 3.5  CL 100 99 100  CO2 21* 26 24  GLUCOSE 173* 95 111*  BUN 18 17 22   CREATININE 1.47* 1.31* 1.28*  CALCIUM 8.0* 8.2* 8.4*    CBC: Recent Labs  Lab 07/24/21 0248 07/27/21 0722 07/28/21 0503 07/29/21 0618  WBC 4.2  2.9* 3.3* 3.1*  NEUTROABS 2.4 1.8  --  1.8  HGB 8.6* 8.8* 9.2* 8.3*  HCT 27.0* 28.7* 30.0* 26.3*  MCV 88.8 89.7 89.8 88.9  PLT 122* 136* 149* 139*    CBG: Recent Labs  Lab 07/28/21 0638 07/28/21 1204 07/28/21 1649 07/28/21 2127 07/29/21 0609  GLUCAP 96 108* 153* 146* 106*    Brief HPI:   Arnoldo Hildreth is a 66 y.o. male  with history of CAD, OSA, morbid obesity, COPD, T2DM who was originally admitted to [facility] [place] with acute on chronic hypoxia as well as sepsis due to necrotizing fasciitis LLE and strep pyogenous bacteremia.  He required multiple I&D's of LLE, tracheostomy due to VDRF, had complications from GI bleed due to gastric ulcer requiring multiple units PRBCs, encephalopathy with diffuse weakness as well as PEG for nutritional support.  He was discharged to [facility] 06/10/21  for vent weaning and was extubated to [facility] by 01/25.  Hospital course was significant for SIRS/MDRO PNA, AECOPD as well as A-fib with RVR.  His medical issues have been optimized and he was showing improvement in activity tolerance and mentation.  Therapy was ongoing and intensive rehab program was recommended. He was admitted to [facility] on 07/15/21 for intensive rehab program due to decline in functional status from critical illness myopathy.     Hospital Course: Corrado Hymon was admitted to rehab 07/15/2021 for inpatient therapies to consist of PT, ST and OT at least three hours five days a week. Past admission physiatrist, therapy team and rehab RN have worked together to provide customized collaborative inpatient rehab.  At admission he required total assist for basic ADL tasks and with mobility.  His respiratory status was stable and Passy-Muir valve was used as button plugging was ineffective.  He has not required supplemental oxygen and has been using the medication at nights only. BLE dopplers done for work up due to bed bound status and showed bilateral lower extremity DVT and was started on IV  heparin as H&H was stable.  Follow-up CBC showed H&H to be relatively stable with thrombocytopenia and no signs of bleeding.  Air mattress was ordered with local care for sacral decub as well as Aquacel to help absorb drainage and promote wound healing large LLE wound with healthy granulation tissue.  Plastics was consulted for input on grafting and recommended operative reconstruction once cleared by cardiology.  He has had issues with resting tachycardia with EKG showing a flutter.  Amiodarone was increased to twice daily for heart rate control.LHC was consulted for cardiac clearance and he underwent echo as well as DBA stress test and was felt to be low cardiac risk for perioperative complications.  Medications were optimized to help manage chronic systolic CHF.  Jardiance was discontinued due to history of necrotizing fasciitis and wounds on sacrum and LLE.  PCCM was also consulted for input and clearance.  They recommended keeping patient on #6 Shiley due to need for intubation for surgery and felt that downsizing would not be recommended as risks outweigh benefits.  Speech therapy has been following with focus on cognition and dysphagia. He was advanced to D3,thin liquids and full supervision for safety with meals. He requires mod cues for recall and use of compensatory strategies.  His p.o. intake has been good therefore tube feeds were discontinued. His diabetes has been monitored with ac/hs CBG checks.  Blood sugars are trending upwards and Lantus has been titrated for tighter control.  Weekly team conferences were held during his stay to monitor progress set goals and discuss barriers to discharge.  He required max assist of 2 for bed mobility and for upper body bathing and total assist for dressing.  He required contact-guard to mod assist to maintain sitting balance at edge of bed.  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   Tramadol was scheduled 3 times daily to  help with tolerance of therapy in addition to oxycodone prn. He was transitioned to heparin on 02/27 in anticipation of surgery.  He required visual cues to maintain midline orientation.  He was discharged to acute floor for plastic surgery on 07/29/21   Diet: Dysphagia 3, thins liquids. Assistance with meals/set up and full supervision for safety.   Special Instructions:  Trach and PEG care.  Use PMSV during the day. Remove PMSV and use ATC 28% via at bedtime.  Water flushes 100 cc qid.   Current medications:   acetaminophen  1,000 mg Oral Once   amantadine  50 mg Oral Daily   amiodarone  200 mg Oral BID   vitamin C  500 mg Oral BID   busPIRone  7.5 mg Oral BID   Chlorhexidine Gluconate Cloth  6 each Topical Once   And   Chlorhexidine Gluconate Cloth  6 each Topical Once   cholecalciferol  1,000 Units Oral Daily   ferrous sulfate  300 mg Per Tube BID WC   free water  50 mL Per Tube BID   furosemide  20 mg Oral BID   insulin aspart  0-15 Units Subcutaneous TID WC   insulin aspart  0-5 Units Subcutaneous QHS   insulin glargine-yfgn  24 Units Subcutaneous QHS   latanoprost  1 drop Both Eyes QHS   levothyroxine  75 mcg Oral Q0600   loratadine  10 mg Oral Daily   melatonin  3 mg Oral QHS   metoprolol tartrate  25 mg Oral BID   multivitamin with minerals  1 tablet Oral Daily   nutrition supplement (JUVEN)  1 packet Per Tube BID BM   pantoprazole sodium  40 mg Per Tube BID   polycarbophil  625 mg Oral BID   predniSONE  10 mg Oral Q breakfast   pregabalin  50 mg Oral Daily   primidone  50 mg Oral Daily   Ensure Max Protein  330 oz Oral QHS   risperiDONE  1 mg Oral BID   sertraline  100 mg Oral QHS   sodium hypochlorite   Topical BID   traMADol  50 mg Oral Q6H     Disposition: Acute hospital    Signed: Bary Leriche 07/29/2021, 11:20 AM

## 2021-07-29 ENCOUNTER — Encounter (HOSPITAL_COMMUNITY)
Admission: RE | Disposition: A | Discharge status: Other Acute Inpatient Hospital | Payer: Self-pay | Attending: Physical Medicine and Rehabilitation

## 2021-07-29 ENCOUNTER — Inpatient Hospital Stay (HOSPITAL_COMMUNITY): Payer: Medicare Other | Admitting: Anesthesiology

## 2021-07-29 ENCOUNTER — Encounter (HOSPITAL_COMMUNITY): Payer: Self-pay | Admitting: Plastic Surgery

## 2021-07-29 ENCOUNTER — Inpatient Hospital Stay (HOSPITAL_COMMUNITY)
Admission: RE | Admit: 2021-07-29 | Discharge: 2021-08-11 | DRG: 576 | Disposition: A | Discharge status: Skilled Nursing Facility | Payer: Medicare Other | Source: Ambulatory Visit | Attending: Internal Medicine | Admitting: Internal Medicine

## 2021-07-29 ENCOUNTER — Encounter (HOSPITAL_COMMUNITY)
Admission: RE | Disposition: A | Discharge status: Skilled Nursing Facility | Payer: Self-pay | Source: Ambulatory Visit | Attending: Internal Medicine

## 2021-07-29 ENCOUNTER — Encounter (HOSPITAL_COMMUNITY): Payer: Self-pay | Admitting: Internal Medicine

## 2021-07-29 DIAGNOSIS — Z86718 Personal history of other venous thrombosis and embolism: Secondary | ICD-10-CM

## 2021-07-29 DIAGNOSIS — E1122 Type 2 diabetes mellitus with diabetic chronic kidney disease: Secondary | ICD-10-CM | POA: Diagnosis present

## 2021-07-29 DIAGNOSIS — D631 Anemia in chronic kidney disease: Secondary | ICD-10-CM | POA: Diagnosis not present

## 2021-07-29 DIAGNOSIS — I5042 Chronic combined systolic (congestive) and diastolic (congestive) heart failure: Secondary | ICD-10-CM | POA: Diagnosis present

## 2021-07-29 DIAGNOSIS — Z833 Family history of diabetes mellitus: Secondary | ICD-10-CM

## 2021-07-29 DIAGNOSIS — Z7901 Long term (current) use of anticoagulants: Secondary | ICD-10-CM

## 2021-07-29 DIAGNOSIS — Z930 Tracheostomy status: Secondary | ICD-10-CM | POA: Diagnosis not present

## 2021-07-29 DIAGNOSIS — N182 Chronic kidney disease, stage 2 (mild): Secondary | ICD-10-CM | POA: Diagnosis present

## 2021-07-29 DIAGNOSIS — I2510 Atherosclerotic heart disease of native coronary artery without angina pectoris: Secondary | ICD-10-CM | POA: Diagnosis not present

## 2021-07-29 DIAGNOSIS — Z809 Family history of malignant neoplasm, unspecified: Secondary | ICD-10-CM

## 2021-07-29 DIAGNOSIS — E2740 Unspecified adrenocortical insufficiency: Secondary | ICD-10-CM | POA: Diagnosis not present

## 2021-07-29 DIAGNOSIS — L89159 Pressure ulcer of sacral region, unspecified stage: Secondary | ICD-10-CM | POA: Diagnosis not present

## 2021-07-29 DIAGNOSIS — R5381 Other malaise: Secondary | ICD-10-CM | POA: Diagnosis not present

## 2021-07-29 DIAGNOSIS — E876 Hypokalemia: Secondary | ICD-10-CM | POA: Diagnosis not present

## 2021-07-29 DIAGNOSIS — Z7401 Bed confinement status: Secondary | ICD-10-CM

## 2021-07-29 DIAGNOSIS — E039 Hypothyroidism, unspecified: Secondary | ICD-10-CM | POA: Diagnosis not present

## 2021-07-29 DIAGNOSIS — I82419 Acute embolism and thrombosis of unspecified femoral vein: Secondary | ICD-10-CM | POA: Diagnosis not present

## 2021-07-29 DIAGNOSIS — I429 Cardiomyopathy, unspecified: Secondary | ICD-10-CM | POA: Diagnosis present

## 2021-07-29 DIAGNOSIS — M726 Necrotizing fasciitis: Secondary | ICD-10-CM | POA: Diagnosis not present

## 2021-07-29 DIAGNOSIS — Z8739 Personal history of other diseases of the musculoskeletal system and connective tissue: Secondary | ICD-10-CM

## 2021-07-29 DIAGNOSIS — G4733 Obstructive sleep apnea (adult) (pediatric): Secondary | ICD-10-CM | POA: Diagnosis present

## 2021-07-29 DIAGNOSIS — I480 Paroxysmal atrial fibrillation: Secondary | ICD-10-CM | POA: Diagnosis present

## 2021-07-29 DIAGNOSIS — E1165 Type 2 diabetes mellitus with hyperglycemia: Secondary | ICD-10-CM | POA: Diagnosis not present

## 2021-07-29 DIAGNOSIS — E662 Morbid (severe) obesity with alveolar hypoventilation: Secondary | ICD-10-CM | POA: Diagnosis present

## 2021-07-29 DIAGNOSIS — X58XXXA Exposure to other specified factors, initial encounter: Secondary | ICD-10-CM | POA: Diagnosis present

## 2021-07-29 DIAGNOSIS — L89153 Pressure ulcer of sacral region, stage 3: Secondary | ICD-10-CM | POA: Diagnosis present

## 2021-07-29 DIAGNOSIS — S81809A Unspecified open wound, unspecified lower leg, initial encounter: Principal | ICD-10-CM | POA: Diagnosis present

## 2021-07-29 DIAGNOSIS — G7281 Critical illness myopathy: Secondary | ICD-10-CM | POA: Diagnosis not present

## 2021-07-29 DIAGNOSIS — S31829A Unspecified open wound of left buttock, initial encounter: Secondary | ICD-10-CM | POA: Diagnosis not present

## 2021-07-29 DIAGNOSIS — N1831 Chronic kidney disease, stage 3a: Secondary | ICD-10-CM

## 2021-07-29 DIAGNOSIS — Z20822 Contact with and (suspected) exposure to covid-19: Secondary | ICD-10-CM | POA: Diagnosis not present

## 2021-07-29 DIAGNOSIS — F32A Depression, unspecified: Secondary | ICD-10-CM | POA: Diagnosis present

## 2021-07-29 DIAGNOSIS — Z6839 Body mass index (BMI) 39.0-39.9, adult: Secondary | ICD-10-CM

## 2021-07-29 DIAGNOSIS — Z794 Long term (current) use of insulin: Secondary | ICD-10-CM

## 2021-07-29 DIAGNOSIS — I110 Hypertensive heart disease with heart failure: Secondary | ICD-10-CM

## 2021-07-29 DIAGNOSIS — Z7952 Long term (current) use of systemic steroids: Secondary | ICD-10-CM

## 2021-07-29 DIAGNOSIS — S81802A Unspecified open wound, left lower leg, initial encounter: Secondary | ICD-10-CM

## 2021-07-29 DIAGNOSIS — Z79899 Other long term (current) drug therapy: Secondary | ICD-10-CM

## 2021-07-29 DIAGNOSIS — F419 Anxiety disorder, unspecified: Secondary | ICD-10-CM | POA: Diagnosis not present

## 2021-07-29 DIAGNOSIS — Z86711 Personal history of pulmonary embolism: Secondary | ICD-10-CM

## 2021-07-29 DIAGNOSIS — Z85038 Personal history of other malignant neoplasm of large intestine: Secondary | ICD-10-CM

## 2021-07-29 DIAGNOSIS — Z7951 Long term (current) use of inhaled steroids: Secondary | ICD-10-CM

## 2021-07-29 DIAGNOSIS — Z79890 Hormone replacement therapy (postmenopausal): Secondary | ICD-10-CM

## 2021-07-29 HISTORY — PX: WOUND DEBRIDEMENT: SHX247

## 2021-07-29 HISTORY — PX: SKIN SPLIT GRAFT: SHX444

## 2021-07-29 HISTORY — PX: APPLICATION OF WOUND VAC: SHX5189

## 2021-07-29 HISTORY — PX: INCISION AND DRAINAGE OF WOUND: SHX1803

## 2021-07-29 LAB — GLUCOSE, CAPILLARY
Glucose-Capillary: 106 mg/dL — ABNORMAL HIGH (ref 70–99)
Glucose-Capillary: 103 mg/dL — ABNORMAL HIGH (ref 70–99)
Glucose-Capillary: 100 mg/dL — ABNORMAL HIGH (ref 70–99)
Glucose-Capillary: 171 mg/dL — ABNORMAL HIGH (ref 70–99)
Glucose-Capillary: 193 mg/dL — ABNORMAL HIGH (ref 70–99)

## 2021-07-29 LAB — CBC WITH DIFFERENTIAL/PLATELET
Abs Immature Granulocytes: 0.16 10*3/uL — ABNORMAL HIGH (ref 0.00–0.07)
Basophils Absolute: 0 10*3/uL (ref 0.0–0.1)
Basophils Relative: 0 %
Eosinophils Absolute: 0.1 10*3/uL (ref 0.0–0.5)
Eosinophils Relative: 2 %
HCT: 26.3 % — ABNORMAL LOW (ref 39.0–52.0)
Hemoglobin: 8.3 g/dL — ABNORMAL LOW (ref 13.0–17.0)
Immature Granulocytes: 5 %
Lymphocytes Relative: 22 %
Lymphs Abs: 0.7 10*3/uL (ref 0.7–4.0)
MCH: 28 pg (ref 26.0–34.0)
MCHC: 31.6 g/dL (ref 30.0–36.0)
MCV: 88.9 fL (ref 80.0–100.0)
Monocytes Absolute: 0.4 10*3/uL (ref 0.1–1.0)
Monocytes Relative: 13 %
Neutro Abs: 1.8 10*3/uL (ref 1.7–7.7)
Neutrophils Relative %: 58 %
Platelets: 139 10*3/uL — ABNORMAL LOW (ref 150–400)
RBC: 2.96 MIL/uL — ABNORMAL LOW (ref 4.22–5.81)
RDW: 17.3 % — ABNORMAL HIGH (ref 11.5–15.5)
WBC: 3.1 10*3/uL — ABNORMAL LOW (ref 4.0–10.5)
nRBC: 1.6 % — ABNORMAL HIGH (ref 0.0–0.2)

## 2021-07-29 LAB — BASIC METABOLIC PANEL
Anion gap: 12 (ref 5–15)
BUN: 22 mg/dL (ref 8–23)
CO2: 24 mmol/L (ref 22–32)
Calcium: 8.4 mg/dL — ABNORMAL LOW (ref 8.9–10.3)
Chloride: 100 mmol/L (ref 98–111)
Creatinine, Ser: 1.28 mg/dL — ABNORMAL HIGH (ref 0.61–1.24)
GFR, Estimated: >60 mL/min (ref >60)
Glucose, Bld: 111 mg/dL — ABNORMAL HIGH (ref 70–99)
Potassium: 3.5 mmol/L (ref 3.5–5.1)
Sodium: 136 mmol/L (ref 135–145)

## 2021-07-29 LAB — PREPARE RBC (CROSSMATCH)

## 2021-07-29 LAB — CBC
HCT: 31.8 % — ABNORMAL LOW (ref 39.0–52.0)
Hemoglobin: 10 g/dL — ABNORMAL LOW (ref 13.0–17.0)
MCH: 28.3 pg (ref 26.0–34.0)
MCHC: 31.4 g/dL (ref 30.0–36.0)
MCV: 90.1 fL (ref 80.0–100.0)
Platelets: 136 10*3/uL — ABNORMAL LOW (ref 150–400)
RBC: 3.53 MIL/uL — ABNORMAL LOW (ref 4.22–5.81)
RDW: 16.8 % — ABNORMAL HIGH (ref 11.5–15.5)
WBC: 4.3 10*3/uL (ref 4.0–10.5)
nRBC: 0.9 % — ABNORMAL HIGH (ref 0.0–0.2)

## 2021-07-29 LAB — HEPARIN LEVEL (UNFRACTIONATED): Heparin Unfractionated: 0.99 IU/mL — ABNORMAL HIGH (ref 0.30–0.70)

## 2021-07-29 LAB — HEMOGLOBIN A1C
Hgb A1c MFr Bld: 5.9 % — ABNORMAL HIGH (ref 4.8–5.6)
Mean Plasma Glucose: 122.63 mg/dL

## 2021-07-29 LAB — APTT: aPTT: 90 seconds — ABNORMAL HIGH (ref 24–36)

## 2021-07-29 LAB — ABO/RH: ABO/RH(D): O POS

## 2021-07-29 LAB — SARS CORONAVIRUS 2 BY RT PCR (HOSPITAL ORDER, PERFORMED IN ~~LOC~~ HOSPITAL LAB): SARS Coronavirus 2: NEGATIVE

## 2021-07-29 SURGERY — IRRIGATION AND DEBRIDEMENT WOUND
Anesthesia: General | Site: Leg Lower | Laterality: Left

## 2021-07-29 MED ORDER — LIDOCAINE 2% (20 MG/ML) 5 ML SYRINGE
INTRAMUSCULAR | Status: AC
  Filled 2021-07-29: qty 5

## 2021-07-29 MED ORDER — RISPERIDONE 1 MG PO TABS
1.0000 mg | ORAL_TABLET | Freq: Two times a day (BID) | ORAL | Status: DC
  Administered 2021-07-30 – 2021-08-11 (×26): 1 mg
  Filled 2021-07-29 (×28): qty 1

## 2021-07-29 MED ORDER — PREDNISONE 10 MG PO TABS
10.0000 mg | ORAL_TABLET | Freq: Every day | ORAL | Status: DC
  Administered 2021-07-30 – 2021-08-11 (×13): 10 mg
  Filled 2021-07-29 (×13): qty 1

## 2021-07-29 MED ORDER — FERROUS SULFATE 300 (60 FE) MG/5ML PO SYRP
300.0000 mg | ORAL_SOLUTION | Freq: Every day | ORAL | Status: DC
  Administered 2021-07-30 – 2021-08-11 (×13): 300 mg
  Filled 2021-07-29 (×13): qty 5

## 2021-07-29 MED ORDER — METOPROLOL TARTRATE 50 MG PO TABS: 50.0000 mg | ORAL_TABLET | Freq: Two times a day (BID) | ORAL | Status: DC

## 2021-07-29 MED ORDER — LORATADINE 10 MG PO TABS
10.0000 mg | ORAL_TABLET | Freq: Every day | ORAL | Status: DC
  Administered 2021-07-30 – 2021-08-11 (×13): 10 mg
  Filled 2021-07-29 (×13): qty 1

## 2021-07-29 MED ORDER — MINERAL OIL LIGHT OIL
TOPICAL_OIL | Freq: Once | Status: DC
  Filled 2021-07-29: qty 30

## 2021-07-29 MED ORDER — PANTOPRAZOLE SODIUM 20 MG PO TBEC
40.0000 mg | DELAYED_RELEASE_TABLET | Freq: Every day | ORAL | Status: DC
  Filled 2021-07-29: qty 2

## 2021-07-29 MED ORDER — EPINEPHRINE PF 1 MG/ML IJ SOLN
INTRAMUSCULAR | Status: DC | PRN
  Administered 2021-07-29 (×3): 1 mg

## 2021-07-29 MED ORDER — MINERAL OIL LIGHT 100 % EX OIL
TOPICAL_OIL | CUTANEOUS | Status: DC | PRN
  Administered 2021-07-29: 5 via TOPICAL
  Administered 2021-07-29: 2 via TOPICAL

## 2021-07-29 MED ORDER — OXYCODONE HCL 5 MG PO TABS: 5.0000 mg | ORAL_TABLET | Freq: Once | ORAL | Status: DC | PRN

## 2021-07-29 MED ORDER — ACETAMINOPHEN 650 MG RE SUPP: 650.0000 mg | Freq: Four times a day (QID) | RECTAL | Status: DC | PRN

## 2021-07-29 MED ORDER — ALBUTEROL SULFATE (2.5 MG/3ML) 0.083% IN NEBU: 2.5000 mg | INHALATION_SOLUTION | RESPIRATORY_TRACT | Status: DC | PRN

## 2021-07-29 MED ORDER — SODIUM CHLORIDE 0.9 % IV SOLN
INTRAVENOUS | Status: DC
Start: 2021-07-29 — End: 2021-07-31

## 2021-07-29 MED ORDER — CHLORHEXIDINE GLUCONATE 0.12 % MT SOLN
15.0000 mL | Freq: Once | OROMUCOSAL | Status: AC
  Administered 2021-07-29: 15 mL via OROMUCOSAL

## 2021-07-29 MED ORDER — LORATADINE 10 MG PO TABS
10.0000 mg | ORAL_TABLET | Freq: Every day | ORAL | Status: DC
Start: 2021-07-29 — End: 2021-07-29

## 2021-07-29 MED ORDER — OXYCODONE HCL 5 MG PO TABS: 5.0000 mg | ORAL_TABLET | ORAL | Status: DC | PRN

## 2021-07-29 MED ORDER — DEXAMETHASONE SODIUM PHOSPHATE 10 MG/ML IJ SOLN
INTRAMUSCULAR | Status: DC | PRN
  Administered 2021-07-29: 4 mg via INTRAVENOUS

## 2021-07-29 MED ORDER — FENTANYL CITRATE (PF) 100 MCG/2ML IJ SOLN
INTRAMUSCULAR | Status: DC | PRN
  Administered 2021-07-29 (×2): 25 ug via INTRAVENOUS

## 2021-07-29 MED ORDER — EPHEDRINE SULFATE-NACL 50-0.9 MG/10ML-% IV SOSY
PREFILLED_SYRINGE | INTRAVENOUS | Status: DC | PRN
  Administered 2021-07-29: 10 mg via INTRAVENOUS

## 2021-07-29 MED ORDER — IPRATROPIUM-ALBUTEROL 0.5-2.5 (3) MG/3ML IN SOLN
3.0000 mL | Freq: Four times a day (QID) | RESPIRATORY_TRACT | Status: DC
  Filled 2021-07-29: qty 3

## 2021-07-29 MED ORDER — CEFAZOLIN SODIUM-DEXTROSE 2-3 GM-%(50ML) IV SOLR
INTRAVENOUS | Status: DC | PRN
  Administered 2021-07-29: 2 g via INTRAVENOUS

## 2021-07-29 MED ORDER — LEVOTHYROXINE SODIUM 75 MCG PO TABS
75.0000 ug | ORAL_TABLET | Freq: Every day | ORAL | Status: DC
  Administered 2021-07-30 – 2021-08-11 (×13): 75 ug
  Filled 2021-07-29 (×13): qty 1

## 2021-07-29 MED ORDER — ONDANSETRON HCL 4 MG/2ML IJ SOLN: 4.0000 mg | Freq: Once | INTRAMUSCULAR | Status: DC | PRN

## 2021-07-29 MED ORDER — ONDANSETRON HCL 4 MG/2ML IJ SOLN
INTRAMUSCULAR | Status: AC
  Filled 2021-07-29: qty 2

## 2021-07-29 MED ORDER — PREGABALIN 50 MG PO CAPS: 50.0000 mg | ORAL_CAPSULE | Freq: Every day | ORAL | Status: DC

## 2021-07-29 MED ORDER — RISPERIDONE 1 MG PO TABS
1.0000 mg | ORAL_TABLET | Freq: Two times a day (BID) | ORAL | Status: DC
  Filled 2021-07-29: qty 1

## 2021-07-29 MED ORDER — EPINEPHRINE PF 1 MG/ML IJ SOLN
INTRAMUSCULAR | Status: AC
  Filled 2021-07-29: qty 1

## 2021-07-29 MED ORDER — ORAL CARE MOUTH RINSE: 15.0000 mL | Freq: Once | OROMUCOSAL | Status: AC

## 2021-07-29 MED ORDER — INSULIN ASPART 100 UNIT/ML IJ SOLN: 0.0000 [IU] | INTRAMUSCULAR | Status: DC | PRN

## 2021-07-29 MED ORDER — DOCUSATE SODIUM 50 MG/5ML PO LIQD
100.0000 mg | Freq: Two times a day (BID) | ORAL | Status: DC
  Administered 2021-07-30 – 2021-08-11 (×26): 100 mg
  Filled 2021-07-29 (×26): qty 10

## 2021-07-29 MED ORDER — LIDOCAINE 2% (20 MG/ML) 5 ML SYRINGE
INTRAMUSCULAR | Status: DC | PRN
  Administered 2021-07-29: 100 mg via INTRAVENOUS

## 2021-07-29 MED ORDER — CHLORHEXIDINE GLUCONATE 0.12 % MT SOLN: 15.0000 mL | OROMUCOSAL | Status: AC

## 2021-07-29 MED ORDER — SODIUM CHLORIDE 0.9 % IV SOLN: 10.0000 mL/h | Freq: Once | INTRAVENOUS | Status: AC

## 2021-07-29 MED ORDER — ONDANSETRON HCL 4 MG/2ML IJ SOLN
INTRAMUSCULAR | Status: DC | PRN
  Administered 2021-07-29: 4 mg via INTRAVENOUS

## 2021-07-29 MED ORDER — SODIUM CHLORIDE 0.9 % IV SOLN: 250.0000 mL | INTRAVENOUS | Status: DC | PRN

## 2021-07-29 MED ORDER — BUPIVACAINE HCL (PF) 0.25 % IJ SOLN
INTRAMUSCULAR | Status: AC
  Filled 2021-07-29: qty 30

## 2021-07-29 MED ORDER — ROCURONIUM BROMIDE 10 MG/ML (PF) SYRINGE
PREFILLED_SYRINGE | INTRAVENOUS | Status: DC | PRN
  Administered 2021-07-29: 20 mg via INTRAVENOUS
  Administered 2021-07-29: 100 mg via INTRAVENOUS

## 2021-07-29 MED ORDER — PROPOFOL 10 MG/ML IV BOLUS
INTRAVENOUS | Status: DC | PRN
  Administered 2021-07-29: 30 mg via INTRAVENOUS
  Administered 2021-07-29: 80 mg via INTRAVENOUS
  Administered 2021-07-29: 50 mg via INTRAVENOUS

## 2021-07-29 MED ORDER — MELATONIN 3 MG PO TABS: 3.0000 mg | ORAL_TABLET | Freq: Every day | ORAL | Status: DC

## 2021-07-29 MED ORDER — ONDANSETRON HCL 4 MG/2ML IJ SOLN: 4.0000 mg | Freq: Four times a day (QID) | INTRAMUSCULAR | Status: DC | PRN

## 2021-07-29 MED ORDER — DOCUSATE SODIUM 100 MG PO CAPS: 100.0000 mg | ORAL_CAPSULE | Freq: Two times a day (BID) | ORAL | Status: DC

## 2021-07-29 MED ORDER — AMIODARONE HCL 200 MG PO TABS
200.0000 mg | ORAL_TABLET | Freq: Every day | ORAL | Status: DC
  Administered 2021-07-30 – 2021-08-11 (×13): 200 mg
  Filled 2021-07-29 (×13): qty 1

## 2021-07-29 MED ORDER — METOPROLOL TARTRATE 12.5 MG HALF TABLET
12.5000 mg | ORAL_TABLET | Freq: Two times a day (BID) | ORAL | Status: DC
  Administered 2021-07-30 – 2021-08-09 (×21): 12.5 mg
  Filled 2021-07-29 (×23): qty 1

## 2021-07-29 MED ORDER — BUSPIRONE HCL 5 MG PO TABS
7.5000 mg | ORAL_TABLET | Freq: Two times a day (BID) | ORAL | Status: DC
  Administered 2021-07-30 – 2021-08-11 (×26): 7.5 mg
  Filled 2021-07-29 (×25): qty 2

## 2021-07-29 MED ORDER — ACETAMINOPHEN 325 MG PO TABS: 650.0000 mg | ORAL_TABLET | Freq: Four times a day (QID) | ORAL | Status: DC | PRN

## 2021-07-29 MED ORDER — METOPROLOL TARTRATE 12.5 MG HALF TABLET: 12.5000 mg | ORAL_TABLET | Freq: Two times a day (BID) | ORAL | Status: DC

## 2021-07-29 MED ORDER — LEVOTHYROXINE SODIUM 75 MCG PO TABS: 75.0000 ug | ORAL_TABLET | Freq: Every day | ORAL | Status: DC

## 2021-07-29 MED ORDER — HYDROMORPHONE HCL 1 MG/ML IJ SOLN
0.5000 mg | INTRAMUSCULAR | Status: DC | PRN
  Administered 2021-07-31 – 2021-08-10 (×12): 0.5 mg via INTRAVENOUS
  Filled 2021-07-29 (×12): qty 0.5

## 2021-07-29 MED ORDER — PREDNISONE 10 MG PO TABS: 10.0000 mg | ORAL_TABLET | Freq: Every day | ORAL | Status: DC

## 2021-07-29 MED ORDER — PANTOPRAZOLE 2 MG/ML SUSPENSION
40.0000 mg | Freq: Every day | ORAL | Status: DC
  Administered 2021-07-30: 40 mg
  Filled 2021-07-29: qty 20

## 2021-07-29 MED ORDER — SODIUM CHLORIDE 0.9% FLUSH: 3.0000 mL | INTRAVENOUS | Status: DC | PRN

## 2021-07-29 MED ORDER — PREGABALIN 50 MG PO CAPS
50.0000 mg | ORAL_CAPSULE | Freq: Every day | ORAL | Status: DC
  Administered 2021-07-30 – 2021-08-11 (×13): 50 mg
  Filled 2021-07-29 (×13): qty 1

## 2021-07-29 MED ORDER — FUROSEMIDE 20 MG PO TABS: 20.0000 mg | ORAL_TABLET | Freq: Two times a day (BID) | ORAL | Status: DC

## 2021-07-29 MED ORDER — LACTATED RINGERS IV SOLN: INTRAVENOUS | Status: DC

## 2021-07-29 MED ORDER — INSULIN ASPART 100 UNIT/ML IJ SOLN: 0.0000 [IU] | Freq: Three times a day (TID) | INTRAMUSCULAR | Status: DC

## 2021-07-29 MED ORDER — CHLORHEXIDINE GLUCONATE 0.12 % MT SOLN
OROMUCOSAL | Status: AC
  Administered 2021-07-29: 15 mL via OROMUCOSAL
  Filled 2021-07-29: qty 15

## 2021-07-29 MED ORDER — SERTRALINE HCL 100 MG PO TABS
100.0000 mg | ORAL_TABLET | Freq: Every day | ORAL | Status: DC
  Administered 2021-07-30 – 2021-08-10 (×13): 100 mg
  Filled 2021-07-29 (×13): qty 1

## 2021-07-29 MED ORDER — ADULT MULTIVITAMIN W/MINERALS CH
1.0000 | ORAL_TABLET | Freq: Every day | ORAL | Status: DC
Start: 2021-07-29 — End: 2021-07-29

## 2021-07-29 MED ORDER — ROCURONIUM BROMIDE 10 MG/ML (PF) SYRINGE
PREFILLED_SYRINGE | INTRAVENOUS | Status: AC
  Filled 2021-07-29: qty 10

## 2021-07-29 MED ORDER — ADULT MULTIVITAMIN W/MINERALS CH
1.0000 | ORAL_TABLET | Freq: Every day | ORAL | Status: DC
  Administered 2021-07-30 – 2021-08-10 (×13): 1
  Filled 2021-07-29 (×13): qty 1

## 2021-07-29 MED ORDER — PREDNISONE 10 MG PO TABS
10.0000 mg | ORAL_TABLET | Freq: Once | ORAL | Status: AC
  Administered 2021-07-30: 10 mg
  Filled 2021-07-29: qty 1

## 2021-07-29 MED ORDER — POLYVINYL ALCOHOL 1.4 % OP SOLN
1.0000 [drp] | Freq: Four times a day (QID) | OPHTHALMIC | Status: DC
  Administered 2021-07-30 – 2021-08-11 (×47): 1 [drp] via OPHTHALMIC
  Filled 2021-07-29: qty 15

## 2021-07-29 MED ORDER — VITAMIN D3 25 MCG (1000 UNIT) PO TABS
1000.0000 [IU] | ORAL_TABLET | Freq: Every day | ORAL | Status: DC
  Filled 2021-07-29: qty 1

## 2021-07-29 MED ORDER — 0.9 % SODIUM CHLORIDE (POUR BTL) OPTIME
TOPICAL | Status: DC | PRN
  Administered 2021-07-29: 1000 mL

## 2021-07-29 MED ORDER — FENTANYL CITRATE PF 50 MCG/ML IJ SOSY: 50.0000 ug | PREFILLED_SYRINGE | INTRAMUSCULAR | Status: DC | PRN

## 2021-07-29 MED ORDER — ENOXAPARIN SODIUM 30 MG/0.3ML IJ SOSY: 30.0000 mg | PREFILLED_SYRINGE | INTRAMUSCULAR | Status: DC

## 2021-07-29 MED ORDER — OXYCODONE HCL 5 MG/5ML PO SOLN: 5.0000 mg | Freq: Once | ORAL | Status: DC | PRN

## 2021-07-29 MED ORDER — ACETAMINOPHEN 160 MG/5ML PO SOLN
650.0000 mg | Freq: Four times a day (QID) | ORAL | Status: DC | PRN
  Administered 2021-08-03 – 2021-08-06 (×3): 650 mg
  Filled 2021-07-29 (×2): qty 20.3

## 2021-07-29 MED ORDER — HEPARIN (PORCINE) 25000 UT/250ML-% IV SOLN
1500.0000 [IU]/h | INTRAVENOUS | Status: DC
  Administered 2021-07-29 – 2021-08-02 (×6): 1500 [IU]/h via INTRAVENOUS
  Filled 2021-07-29 (×6): qty 250

## 2021-07-29 MED ORDER — LIDOCAINE-EPINEPHRINE 1 %-1:100000 IJ SOLN
INTRAMUSCULAR | Status: DC | PRN
  Administered 2021-07-29: 20 mL

## 2021-07-29 MED ORDER — SERTRALINE HCL 100 MG PO TABS: 100.0000 mg | ORAL_TABLET | Freq: Every day | ORAL | Status: DC

## 2021-07-29 MED ORDER — DEXAMETHASONE SODIUM PHOSPHATE 10 MG/ML IJ SOLN
INTRAMUSCULAR | Status: AC
  Filled 2021-07-29: qty 1

## 2021-07-29 MED ORDER — MELATONIN 3 MG PO TABS
3.0000 mg | ORAL_TABLET | Freq: Every day | ORAL | Status: DC
  Administered 2021-07-30 – 2021-08-10 (×13): 3 mg
  Filled 2021-07-29 (×13): qty 1

## 2021-07-29 MED ORDER — ONDANSETRON 4 MG PO TBDP: 4.0000 mg | ORAL_TABLET | Freq: Four times a day (QID) | ORAL | Status: DC | PRN

## 2021-07-29 MED ORDER — LIDOCAINE-EPINEPHRINE 1 %-1:100000 IJ SOLN
INTRAMUSCULAR | Status: AC
Start: 2021-07-29 — End: ?
  Filled 2021-07-29: qty 1

## 2021-07-29 MED ORDER — SUGAMMADEX SODIUM 200 MG/2ML IV SOLN
INTRAVENOUS | Status: DC | PRN
  Administered 2021-07-29: 300 mg via INTRAVENOUS

## 2021-07-29 MED ORDER — SODIUM CHLORIDE 0.9% FLUSH
3.0000 mL | Freq: Two times a day (BID) | INTRAVENOUS | Status: DC
  Administered 2021-07-29 – 2021-08-11 (×21): 3 mL via INTRAVENOUS

## 2021-07-29 MED ORDER — BUSPIRONE HCL 15 MG PO TABS
7.5000 mg | ORAL_TABLET | Freq: Two times a day (BID) | ORAL | Status: DC
  Filled 2021-07-29: qty 1

## 2021-07-29 MED ORDER — PREDNISONE 10 MG PO TABS: 10.0000 mg | ORAL_TABLET | Freq: Once | ORAL | Status: DC

## 2021-07-29 MED ORDER — VITAMIN D3 25 MCG (1000 UNIT) PO TABS: 1000.0000 [IU] | ORAL_TABLET | Freq: Every day | ORAL | Status: DC

## 2021-07-29 MED ORDER — PHENYLEPHRINE 40 MCG/ML (10ML) SYRINGE FOR IV PUSH (FOR BLOOD PRESSURE SUPPORT)
PREFILLED_SYRINGE | INTRAVENOUS | Status: AC
  Filled 2021-07-29: qty 10

## 2021-07-29 MED ORDER — THROMBIN 20000 UNITS EX KIT
PACK | CUTANEOUS | Status: DC | PRN
  Administered 2021-07-29: 20000 [IU] via TOPICAL

## 2021-07-29 MED ORDER — PHENYLEPHRINE HCL-NACL 20-0.9 MG/250ML-% IV SOLN
INTRAVENOUS | Status: DC | PRN
  Administered 2021-07-29: 30 ug/min via INTRAVENOUS

## 2021-07-29 MED ORDER — BUPIVACAINE HCL 0.25 % IJ SOLN
INTRAMUSCULAR | Status: DC | PRN
  Administered 2021-07-29: 30 mL

## 2021-07-29 MED ORDER — MINERAL OIL LIGHT OIL
TOPICAL_OIL | Freq: Once | Status: DC
  Filled 2021-07-29: qty 10

## 2021-07-29 MED ORDER — INSULIN ASPART 100 UNIT/ML IJ SOLN
0.0000 [IU] | INTRAMUSCULAR | Status: DC
  Administered 2021-07-30: 2 [IU] via SUBCUTANEOUS
  Administered 2021-07-30: 1 [IU] via SUBCUTANEOUS
  Administered 2021-07-30: 3 [IU] via SUBCUTANEOUS
  Administered 2021-07-30 (×3): 2 [IU] via SUBCUTANEOUS
  Administered 2021-07-31: 1 [IU] via SUBCUTANEOUS
  Administered 2021-07-31 – 2021-08-01 (×5): 2 [IU] via SUBCUTANEOUS
  Administered 2021-08-02: 1 [IU] via SUBCUTANEOUS
  Administered 2021-08-02 – 2021-08-03 (×2): 2 [IU] via SUBCUTANEOUS
  Administered 2021-08-03: 3 [IU] via SUBCUTANEOUS
  Administered 2021-08-03 – 2021-08-04 (×2): 1 [IU] via SUBCUTANEOUS
  Administered 2021-08-04: 5 [IU] via SUBCUTANEOUS
  Administered 2021-08-04 (×2): 2 [IU] via SUBCUTANEOUS
  Administered 2021-08-05: 3 [IU] via SUBCUTANEOUS
  Administered 2021-08-05: 5 [IU] via SUBCUTANEOUS
  Administered 2021-08-05: 2 [IU] via SUBCUTANEOUS
  Administered 2021-08-05: 3 [IU] via SUBCUTANEOUS
  Administered 2021-08-06: 17:00:00 5 [IU] via SUBCUTANEOUS
  Administered 2021-08-06: 04:00:00 1 [IU] via SUBCUTANEOUS
  Administered 2021-08-06: 22:00:00 3 [IU] via SUBCUTANEOUS
  Administered 2021-08-06: 01:00:00 2 [IU] via SUBCUTANEOUS
  Administered 2021-08-07: 3 [IU] via SUBCUTANEOUS
  Administered 2021-08-07: 5 [IU] via SUBCUTANEOUS
  Administered 2021-08-07 – 2021-08-08 (×2): 2 [IU] via SUBCUTANEOUS
  Administered 2021-08-08: 7 [IU] via SUBCUTANEOUS
  Administered 2021-08-08: 3 [IU] via SUBCUTANEOUS
  Administered 2021-08-08: 1 [IU] via SUBCUTANEOUS
  Administered 2021-08-09: 5 [IU] via SUBCUTANEOUS
  Administered 2021-08-09: 3 [IU] via SUBCUTANEOUS
  Administered 2021-08-09: 2 [IU] via SUBCUTANEOUS
  Administered 2021-08-09: 5 [IU] via SUBCUTANEOUS
  Administered 2021-08-09: 1 [IU] via SUBCUTANEOUS
  Administered 2021-08-10 (×4): 2 [IU] via SUBCUTANEOUS
  Administered 2021-08-10: 5 [IU] via SUBCUTANEOUS
  Administered 2021-08-11 (×3): 1 [IU] via SUBCUTANEOUS
  Administered 2021-08-11: 3 [IU] via SUBCUTANEOUS

## 2021-07-29 MED ORDER — AMIODARONE HCL 200 MG PO TABS: 200.0000 mg | ORAL_TABLET | Freq: Every day | ORAL | Status: DC

## 2021-07-29 MED ORDER — EPINEPHRINE PF 1 MG/ML IJ SOLN
INTRAMUSCULAR | Status: AC
  Filled 2021-07-29: qty 4

## 2021-07-29 MED ORDER — ACETAMINOPHEN 500 MG PO TABS
1000.0000 mg | ORAL_TABLET | Freq: Once | ORAL | Status: AC
  Administered 2021-07-29: 1000 mg via ORAL
  Filled 2021-07-29: qty 2

## 2021-07-29 MED ORDER — FENTANYL CITRATE (PF) 250 MCG/5ML IJ SOLN
INTRAMUSCULAR | Status: AC
Start: 2021-07-29 — End: ?
  Filled 2021-07-29: qty 5

## 2021-07-29 MED ORDER — MINERAL OIL LIGHT OIL
TOPICAL_OIL | Freq: Once | Status: DC
  Filled 2021-07-29: qty 60

## 2021-07-29 MED ORDER — THROMBIN 20000 UNITS EX KIT
PACK | CUTANEOUS | Status: AC
  Filled 2021-07-29: qty 1

## 2021-07-29 MED ORDER — INSULIN GLARGINE-YFGN 100 UNIT/ML ~~LOC~~ SOLN
15.0000 [IU] | Freq: Every day | SUBCUTANEOUS | Status: DC
Start: 2021-07-29 — End: 2021-08-11
  Administered 2021-07-30 – 2021-08-10 (×12): 15 [IU] via SUBCUTANEOUS
  Filled 2021-07-29 (×14): qty 0.15

## 2021-07-29 MED ORDER — VITAMIN D 25 MCG (1000 UNIT) PO TABS
1000.0000 [IU] | ORAL_TABLET | Freq: Every day | ORAL | Status: DC
  Administered 2021-07-30 – 2021-08-11 (×13): 1000 [IU]
  Filled 2021-07-29 (×13): qty 1

## 2021-07-29 MED ORDER — ACETAMINOPHEN 500 MG PO TABS: 1000.0000 mg | ORAL_TABLET | Freq: Four times a day (QID) | ORAL | Status: DC

## 2021-07-29 MED ORDER — PHENYLEPHRINE 40 MCG/ML (10ML) SYRINGE FOR IV PUSH (FOR BLOOD PRESSURE SUPPORT)
PREFILLED_SYRINGE | INTRAVENOUS | Status: DC | PRN
  Administered 2021-07-29 (×4): 80 ug via INTRAVENOUS

## 2021-07-29 MED ORDER — ACETAMINOPHEN 500 MG PO TABS
1000.0000 mg | ORAL_TABLET | Freq: Four times a day (QID) | ORAL | Status: DC
  Administered 2021-07-30 – 2021-08-11 (×43): 1000 mg
  Filled 2021-07-29 (×45): qty 2

## 2021-07-29 MED ORDER — MIDAZOLAM HCL 2 MG/2ML IJ SOLN
INTRAMUSCULAR | Status: AC
  Filled 2021-07-29: qty 2

## 2021-07-29 MED ORDER — OXYCODONE HCL 5 MG PO TABS
5.0000 mg | ORAL_TABLET | ORAL | Status: DC | PRN
  Administered 2021-07-30 (×2): 5 mg
  Administered 2021-07-31 – 2021-08-10 (×16): 10 mg
  Filled 2021-07-29 (×7): qty 2
  Filled 2021-07-29: qty 1
  Filled 2021-07-29 (×3): qty 2
  Filled 2021-07-29: qty 1
  Filled 2021-07-29 (×8): qty 2

## 2021-07-29 MED ORDER — INSULIN ASPART 100 UNIT/ML IJ SOLN: 0.0000 [IU] | Freq: Every day | INTRAMUSCULAR | Status: DC

## 2021-07-29 MED ORDER — LATANOPROST 0.005 % OP SOLN
1.0000 [drp] | Freq: Every day | OPHTHALMIC | Status: DC
  Administered 2021-07-30 – 2021-08-10 (×13): 1 [drp] via OPHTHALMIC
  Filled 2021-07-29: qty 2.5

## 2021-07-29 MED ORDER — ASCORBIC ACID 500 MG PO TABS
500.0000 mg | ORAL_TABLET | Freq: Every day | ORAL | Status: DC
Start: 2021-07-29 — End: 2021-07-29

## 2021-07-29 MED ORDER — THROMBIN 5000 UNITS EX SOLR
CUTANEOUS | Status: AC
  Filled 2021-07-29: qty 5000

## 2021-07-29 MED ORDER — AMISULPRIDE (ANTIEMETIC) 5 MG/2ML IV SOLN: 10.0000 mg | Freq: Once | INTRAVENOUS | Status: DC | PRN

## 2021-07-29 MED ORDER — CEFAZOLIN SODIUM 1 G IJ SOLR
INTRAMUSCULAR | Status: AC
  Filled 2021-07-29: qty 20

## 2021-07-29 MED ORDER — AMANTADINE HCL 50 MG/5ML PO SOLN
50.0000 mg | Freq: Every day | ORAL | Status: DC
  Filled 2021-07-29: qty 5

## 2021-07-29 MED ORDER — HYDROMORPHONE HCL 1 MG/ML IJ SOLN: 0.5000 mg | INTRAMUSCULAR | Status: DC | PRN

## 2021-07-29 MED ORDER — FERROUS SULFATE 300 (60 FE) MG/5ML PO SYRP
300.0000 mg | ORAL_SOLUTION | Freq: Every day | ORAL | Status: DC
  Filled 2021-07-29: qty 5

## 2021-07-29 MED ORDER — AMANTADINE HCL 50 MG/5ML PO SOLN
50.0000 mg | Freq: Every day | ORAL | Status: DC
  Administered 2021-07-30 – 2021-08-11 (×13): 50 mg
  Filled 2021-07-29 (×13): qty 5

## 2021-07-29 MED ORDER — ASCORBIC ACID 500 MG PO TABS
500.0000 mg | ORAL_TABLET | Freq: Every day | ORAL | Status: DC
  Administered 2021-07-30 – 2021-08-11 (×13): 500 mg
  Filled 2021-07-29 (×13): qty 1

## 2021-07-29 MED ORDER — HYDROMORPHONE HCL 1 MG/ML IJ SOLN
INTRAMUSCULAR | Status: AC
  Filled 2021-07-29: qty 1

## 2021-07-29 MED ORDER — HYDROMORPHONE HCL 1 MG/ML IJ SOLN
0.2500 mg | INTRAMUSCULAR | Status: DC | PRN
  Administered 2021-07-29: 0.25 mg via INTRAVENOUS

## 2021-07-29 MED ORDER — ONDANSETRON HCL 4 MG PO TABS: 4.0000 mg | ORAL_TABLET | Freq: Four times a day (QID) | ORAL | Status: DC | PRN

## 2021-07-29 SURGICAL SUPPLY — 68 items
BENZOIN TINCTURE PRP APPL 2/3 (GAUZE/BANDAGES/DRESSINGS) ×6 IMPLANT
BLADE DERMATOME SS (BLADE) ×4 IMPLANT
BNDG ELASTIC 6X10 VLCR STRL LF (GAUZE/BANDAGES/DRESSINGS) ×2 IMPLANT
BNDG ELASTIC 6X15 VLCR STRL LF (GAUZE/BANDAGES/DRESSINGS) ×2 IMPLANT
BNDG ELASTIC 6X5.8 VLCR STR LF (GAUZE/BANDAGES/DRESSINGS) ×2 IMPLANT
CANISTER SUCT 3000ML PPV (MISCELLANEOUS) ×4 IMPLANT
CNTNR URN SCR LID CUP LEK RST (MISCELLANEOUS) ×1 IMPLANT
CONT SPEC 4OZ STRL OR WHT (MISCELLANEOUS) ×1
COVER SURGICAL LIGHT HANDLE (MISCELLANEOUS) ×4 IMPLANT
DERMACARRIERS GRAFT 1 TO 1.5 (DISPOSABLE) ×28
DRAPE HALF SHEET 40X57 (DRAPES) ×2 IMPLANT
DRAPE IMP U-DRAPE 54X76 (DRAPES) ×4 IMPLANT
DRAPE INCISE IOBAN 66X45 STRL (DRAPES) ×2 IMPLANT
DRAPE ORTHO SPLIT 77X108 STRL (DRAPES) ×2
DRAPE SURG ORHT 6 SPLT 77X108 (DRAPES) ×2 IMPLANT
DRSG MEPITEL 4X7.2 (GAUZE/BANDAGES/DRESSINGS) ×2 IMPLANT
DRSG MEPITEL 8X12 (GAUZE/BANDAGES/DRESSINGS) ×8 IMPLANT
DRSG TEGADERM 4X10 (GAUZE/BANDAGES/DRESSINGS) ×2 IMPLANT
DRSG TELFA 3X8 NADH (GAUZE/BANDAGES/DRESSINGS) ×48 IMPLANT
DRSG VAC ATS LRG SENSATRAC (GAUZE/BANDAGES/DRESSINGS) ×6 IMPLANT
ELECT CAUTERY BLADE 6.4 (BLADE) ×2 IMPLANT
ELECT REM PT RETURN 9FT ADLT (ELECTROSURGICAL) ×4
ELECTRODE REM PT RTRN 9FT ADLT (ELECTROSURGICAL) ×3 IMPLANT
GAUZE SPONGE 4X4 12PLY STRL (GAUZE/BANDAGES/DRESSINGS) ×2 IMPLANT
GLOVE SURG ENC MOIS LTX SZ7.5 (GLOVE) ×4 IMPLANT
GLOVE SURG ENC TEXT LTX SZ7.5 (GLOVE) ×8 IMPLANT
GLOVE SURG UNDER POLY LF SZ7.5 (GLOVE) ×4 IMPLANT
GOWN STRL REUS W/ TWL LRG LVL3 (GOWN DISPOSABLE) ×6 IMPLANT
GOWN STRL REUS W/TWL LRG LVL3 (GOWN DISPOSABLE) ×2
GOWN STRL REUS W/TWL XL LVL3 (GOWN DISPOSABLE) ×8 IMPLANT
GRAFT DERMACARRIERS 1 TO 1.5 (DISPOSABLE) ×7 IMPLANT
GRAFT MYRIAD 20X20 (Graft) ×2 IMPLANT
GRAFT MYRIAD 3 LAYER 10X10 (Graft) ×2 IMPLANT
GRAFT MYRIAD 3 LAYER 10X20 (Graft) ×2 IMPLANT
HANDPIECE INTERPULSE COAX TIP (DISPOSABLE) ×1
KIT BASIN OR (CUSTOM PROCEDURE TRAY) ×4 IMPLANT
KIT MARKER MARGIN INK (KITS) ×2 IMPLANT
KIT TURNOVER KIT B (KITS) ×4 IMPLANT
MANIFOLD NEPTUNE II (INSTRUMENTS) ×2 IMPLANT
NDL HYPO 25GX1X1/2 BEV (NEEDLE) ×2 IMPLANT
NEEDLE HYPO 25GX1X1/2 BEV (NEEDLE) ×4 IMPLANT
NS IRRIG 1000ML POUR BTL (IV SOLUTION) ×4 IMPLANT
PACK GENERAL/GYN (CUSTOM PROCEDURE TRAY) ×4 IMPLANT
PAD ABD 8X10 STRL (GAUZE/BANDAGES/DRESSINGS) ×4 IMPLANT
PAD ARMBOARD 7.5X6 YLW CONV (MISCELLANEOUS) ×8 IMPLANT
PAD DRESSING TELFA 3X8 NADH (GAUZE/BANDAGES/DRESSINGS) IMPLANT
PADDING CAST COTTON 6X4 STRL (CAST SUPPLIES) ×4 IMPLANT
POWDER MYRIAD MORCELLS 1000MG (Miscellaneous) ×2 IMPLANT
SET HNDPC FAN SPRY TIP SCT (DISPOSABLE) ×1 IMPLANT
SPONGE T-LAP 18X18 ~~LOC~~+RFID (SPONGE) ×8 IMPLANT
STAPLER VISISTAT 35W (STAPLE) ×10 IMPLANT
SURGILUBE 2OZ TUBE FLIPTOP (MISCELLANEOUS) IMPLANT
SUT CHROMIC 4 0 PS 2 18 (SUTURE) IMPLANT
SUT CHROMIC 5 0 PS 3 (SUTURE) ×4 IMPLANT
SUT MNCRL AB 3-0 PS2 27 (SUTURE) IMPLANT
SUT MNCRL AB 4-0 PS2 18 (SUTURE) IMPLANT
SUT MON AB 2-0 CT1 36 (SUTURE) IMPLANT
SUT MON AB 5-0 PS2 18 (SUTURE) IMPLANT
SUT PDS AB 3-0 SH 27 (SUTURE) IMPLANT
SUT SILK 3 0 SH CR/8 (SUTURE) ×2 IMPLANT
SUT VIC AB 5-0 PS2 18 (SUTURE) IMPLANT
SUT VICRYL 3 0 (SUTURE) IMPLANT
SWAB COLLECTION DEVICE MRSA (MISCELLANEOUS) IMPLANT
SWAB CULTURE ESWAB REG 1ML (MISCELLANEOUS) IMPLANT
SYR CONTROL 10ML LL (SYRINGE) ×6 IMPLANT
TOWEL GREEN STERILE (TOWEL DISPOSABLE) ×4 IMPLANT
TOWEL GREEN STERILE FF (TOWEL DISPOSABLE) ×4 IMPLANT
UNDERPAD 30X36 HEAVY ABSORB (UNDERPADS AND DIAPERS) ×4 IMPLANT

## 2021-07-29 NOTE — Anesthesia Procedure Notes (Addendum)
Procedure Name: Intubation ?Date/Time: 07/29/2021 3:42 PM ?Performed by: Genelle Bal, CRNA ?Pre-anesthesia Checklist: Patient identified, Emergency Drugs available, Suction available and Patient being monitored ?Patient Re-evaluated:Patient Re-evaluated prior to induction ?Oxygen Delivery Method: Circle system utilized ?Preoxygenation: Pre-oxygenation with 100% oxygen ?Induction Type: IV induction ?Tube size: 6.0 (Reinforced) mm ?Number of attempts: 1 ?Airway Equipment and Method: Tracheostomy ?Placement Confirmation: positive ETCO2 and breath sounds checked- equal and bilateral ?Secured at: 13 (at stoma) cm ?Tube secured with: Tape ?Dental Injury: Teeth and Oropharynx as per pre-operative assessment  ?Comments: Uncuffed trach removed, 6.0 reinforced ETT inserted through trach stoma site without resistance. +BBS/etCO2. Easy to ventilate. Secured @ 13cm at stoma site with tape. ? ? ? ? ?

## 2021-07-29 NOTE — Op Note (Addendum)
Operative Note  ? ?DATE OF OPERATION: 07/29/2021 ? ?SURGICAL DEPARTMENT: Plastic Surgery ? ?PREOPERATIVE DIAGNOSES:   ?Left leg wound ?Left buttock (sacral) pressure ulcer ? ?POSTOPERATIVE DIAGNOSES:  same ? ?PROCEDURE:   ?1) debridement of left leg wound below the knee 35 x 30 (1050 cm2) ?2) split thickness skin graft from left upper leg to left lower leg 35x30 (1050 cm2) ?3) skin graft substitute (Myriad) to donor site 25 x 35 (875 cm2) ?4) debridement of sacral pressure ulcer left 5x8 (40 cm2) skin and subcutaneous tissue ?5) placement of skin graft substitute left sacrum 5x8 (40 cm2) 1000 mg Myriad morcells ? ?SURGEON: Melene Plan. Jahmya Onofrio, MD ? ?ASSISTANT: Roetta Sessions, PA ? ?ANESTHESIA:  General.  ? ?COMPLICATIONS: None.  ? ?INDICATIONS FOR PROCEDURE:  ?The patient, Tony Young is a 66 y.o. male born on [birth year], is here for treatment of left leg wound, left sacral pressure ulcer. ?MRN: [id number] ? ?CONSENT:  ?Informed consent was obtained directly from the patient. Risks, benefits and alternatives were fully discussed. Specific risks including but not limited to bleeding, infection, hematoma, seroma, scarring, pain, contracture, asymmetry, wound healing problems, and need for further surgery were all discussed. The patient did have an ample opportunity to have questions answered to satisfaction.  ? ?DESCRIPTION OF PROCEDURE:  ?The patient was taken to the operating room. SCDs were placed and preoperative antibiotics were given.  General anesthesia was administered.  The patient was placed in the lateral position with the left side up.  The patient's operative site was prepped and draped in a sterile fashion. A time out was performed and all information was confirmed to be correct.   ? ?Attention was turned toward the left lower leg.  Marcaine and lidocaine with epinephrine mixture was injected to granulation tissue as well as to the pressure sore region.  Attention was turned toward debridement of the  leg.  Primarily curette was for excisional debridement of the leg.  Thin layer of granulation tissue was removed.  Pulse lavage was used to further prepare the left leg for graft.  Following this skin grafts were harvested from the left thigh and meshed 1.5-1.  They were stapled in place to the left leg.  Following this Mepitel followed by [facility] dressing was applied to the left lower leg and a satisfactory seal was achieved.  Attention was then turned toward pressure ulcer. ? ?Pressure ulcer excisional debridement was down to layer subcutaneous tissue using 15 blade to make skin incision and Bovie electrocautery to remove granulation tissue.  Following this myriad morsels were applied covered with Mepitel 4 x 4's and a sterile dressing.  This was then turned toward the donor site.  The donor site was dressed with placement of myriad skin graft substitute 25 x 35 cm.  Following this Mepitel followed by occlusive dressings were applied to the donor site.  Cast padding followed by Ace wrap's was then to used to wrap the left leg. ? ? ?The advanced practice practitioner (APP) assisted throughout the case.  The APP was essential in retraction and counter traction when needed to make the case progress smoothly.  This retraction and assistance made it possible to see the tissue planes for the procedure.  The assistance was needed for hemostasis, tissue re-approximation placement of the grafts.   ? ?The patient tolerated the procedure well.  There were no complications. The patient was allowed to wake from anesthesia, extubated and taken to the recovery room in satisfactory condition.   ?

## 2021-07-29 NOTE — Assessment & Plan Note (Signed)
-   currently appears to be slightly on the dry side, hold home diuretics for tonight and restart when appears euvolemic, carefuly follow fluid status and Cr  

## 2021-07-29 NOTE — Interval H&P Note (Signed)
History and Physical Interval Note: ? ?07/29/2021 ?2:59 PM ? ?Tony Young  has presented today for surgery, with the diagnosis of Leg Wound.  The various methods of treatment have been discussed with the patient and family. After consideration of risks, benefits and other options for treatment, the patient has consented to  Procedure(s): ?IRRIGATION AND DEBRIDEMENT WOUND (Left) ?SKIN GRAFT SPLIT THICKNESS (Left) ?APPLICATION OF SKIN SUBSTITUTE (Left) as a surgical intervention.  The patient's history has been reviewed, patient examined, no change in status, stable for surgery.  I have reviewed the patient's chart and labs.  Questions were answered to the patient's satisfaction.   ? ? ?Lennice Sites ? ? ?

## 2021-07-29 NOTE — Assessment & Plan Note (Signed)
Now sp trach ?

## 2021-07-29 NOTE — Assessment & Plan Note (Signed)
Pt was transfused 1 unit in OR will repeat CBC ?

## 2021-07-29 NOTE — Assessment & Plan Note (Signed)
-   Order Sensitive   SSI  ? - continue home insulin regimen  Decrease to   Lantus  15 units, ? -  check TSH and HgA1C ? - Hold by mouth medications  ? ? ?

## 2021-07-29 NOTE — Progress Notes (Signed)
PROGRESS NOTE   Subjective/Complaints:   Pt reports LBM yesterday.  Asking what time surgery is.  Don't have a time exactly- will monitor CBGs to make sure doesn't drop too much.     ROS:  Pt denies SOB, abd pain, CP, N/V/C/D, and vision changes     Objective:   No results found. Recent Labs    07/28/21 0503 07/29/21 0618  WBC 3.3* 3.1*  HGB 9.2* 8.3*  HCT 30.0* 26.3*  PLT 149* 139*    Recent Labs    07/27/21 0722 07/29/21 0618  NA 135 136  K 3.1* 3.5  CL 99 100  CO2 26 24  GLUCOSE 95 111*  BUN 17 22  CREATININE 1.31* 1.28*  CALCIUM 8.2* 8.4*     Intake/Output Summary (Last 24 hours) at 07/29/2021 0748 Last data filed at 07/29/2021 0700 Gross per 24 hour  Intake 358 ml  Output 200 ml  Net 158 ml     Pressure Injury 07/15/21 Buttocks Left Unstageable - Full thickness tissue loss in which the base of the injury is covered by slough (yellow, tan, gray, green or brown) and/or eschar (tan, brown or black) in the wound bed. (Active)  07/15/21 1520  Location: Buttocks  Location Orientation: Left  Staging: Unstageable - Full thickness tissue loss in which the base of the injury is covered by slough (yellow, tan, gray, green or brown) and/or eschar (tan, brown or black) in the wound bed.  Wound Description (Comments):   Present on Admission: Yes    Physical Exam: Vital Signs Blood pressure 90/67, pulse (!) 102, temperature 98.5 F (36.9 C), temperature source Oral, resp. rate 16, height 5\' 7"  (1.702 m), weight 113.4 kg, SpO2 94 %.      General: awake, alert, appropriate,  supine in bed; not covered by sheet; NAD HENT: conjugate gaze; oropharynx moist- trach and PMV- no O2 in place CV: regular rhythm; slightly tachycardic rate; no JVD Pulmonary: CTA B/L; no W/R/R- decreased at bases B/L  GI: soft, NT, ND, (+)BS- normoactive Psychiatric: appropriate Neurological:  more alert/appropriate  today  Skin-    Mild dysarthria RUE weakness   Assessment/Plan: 1. Functional deficits which require 3+ hours per day of interdisciplinary therapy in a comprehensive inpatient rehab setting. Physiatrist is providing close team supervision and 24 hour management of active medical problems listed below. Physiatrist and rehab team continue to assess barriers to discharge/monitor patient progress toward functional and medical goals  Care Tool:  Bathing    Body parts bathed by patient: Chest, Abdomen   Body parts bathed by helper: Right arm, Left arm, Front perineal area, Buttocks, Right upper leg, Left upper leg, Right lower leg, Left lower leg, Face Body parts n/a: Left lower leg   Bathing assist Assist Level: 2 Helpers     Upper Body Dressing/Undressing Upper body dressing   What is the patient wearing?: Hospital gown only    Upper body assist Assist Level: Total Assistance - Patient < 25%    Lower Body Dressing/Undressing Lower body dressing      What is the patient wearing?: Incontinence brief     Lower body assist Assist for lower body dressing: 2  Helpers     Education administrator for toileting: 2 Helpers     Transfers Chair/bed transfer  Transfers assist  Chair/bed transfer activity did not occur: Safety/medical concerns  Chair/bed transfer assist level: Dependent - mechanical lift     Locomotion Ambulation   Ambulation assist   Ambulation activity did not occur: Safety/medical concerns          Walk 10 feet activity   Assist  Walk 10 feet activity did not occur: Safety/medical concerns        Walk 50 feet activity   Assist Walk 50 feet with 2 turns activity did not occur: Safety/medical concerns         Walk 150 feet activity   Assist Walk 150 feet activity did not occur: Safety/medical concerns         Walk 10 feet on uneven surface  activity   Assist Walk 10 feet on uneven surfaces  activity did not occur: Safety/medical concerns         Wheelchair     Assist Is the patient using a wheelchair?: Yes Type of Wheelchair: Manual Wheelchair activity did not occur: Safety/medical concerns         Wheelchair 50 feet with 2 turns activity    Assist    Wheelchair 50 feet with 2 turns activity did not occur: Safety/medical concerns       Wheelchair 150 feet activity     Assist  Wheelchair 150 feet activity did not occur: Safety/medical concerns       Blood pressure 90/67, pulse (!) 102, temperature 98.5 F (36.9 C), temperature source Oral, resp. rate 16, height 5\' 7"  (1.702 m), weight 113.4 kg, SpO2 94 %.  Medical Problem List and Plan: 1. 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    D/c today to acute for skin graft/debridement on bottom- likely cannot come back- go to SNF, due to lack of progress in last 2 weeks. Explained to pt. Is NPO excepts meds via PEG- minimal use 2.  Antithrombotics: -DVT/anticoagulation:  Mechanical: Sequential compression devices, below knee Right lower extremity --high risk for DVTs due to body habitus and immobility. Will check dopplers in am. 2/17- pt has extensive DVTs in RLE and some in LLE- as above- placed on tx dose lovenox   2/20- Plts low 100s, however Hb stable- con't regimen- cannot place on DOAC_ will need Warfarin when change over per pharmacy 2/22- wait to change due to surgical intervention planned.  2/23- d/w Plastic-s will cont lovenox until 1-2 days before surgery then change to heparin drip- will need to go back to acute for skin graft on 3/1 2/27- will change to Heparin gtt due to surgery Wednesday 2/28- on Heparin gtt for surgery             -antiplatelet therapy: N/A 3. Pain: continue oxycodone prn. 2/16- main pain is R  shoulder- con't regimen prn 2/22- L knee looks like swelling/effusion- however likely a strain- con't oxy prnand added tramadol TID  3/1- pain controlled if gets meds.  4. Mood: LCSW to follow for evaluation and support.              -antipsychotic agents:  Risperdal bid 5. Neuropsych: This patient is capable of making decisions on his  own behalf. 6. Skin/Wound Care: Air mattress due to sacral decub and necrotizing fasciitis LLE-              --continue vitamins and supplements to promote wound healing.  7. Fluids/Electrolytes/Nutrition:  Strict I/O as continues to show signs of overload/anasarca.             --low salt diet. Continue Juven to help with protein stores.  2/22- will d/w nutrition if needs Tfs anymore- eating meals?- calorie count being finished today.   2/24- hard ot figure out nutrition since been NPO for 3 days now 8. Strep pyogenes bacteremia/Necrotizing fascitis LLE s/p I & D: Continue daily dressing changes.              --maintain adequate nutritional status to help promote healing. 9. Acute  reps failure s/p trach- and COPD: Respiratory status stable. Continue steriod taper  2/16- con't PMV and SLP for swallowing/speech/phonation  2/20- talking with Resp therapy if can downsize trach- I think we likely can to #4, however will need to discuss directly with Resp therapy.   2/27- won't change right now because going to OR and it's better with #6 shiley.   3/1- shouldn't remove trach unless Pulm wants to- because of pt's OSA as well.  10 OSA/OHS?/VDRF: Continue button plugging during the day and ATC at nights for humidification             --Started discussion that he will likely go home with trach.  11. H/o GIB: H/H improving. Continue PPI BID.              --no signs of bleeding. Will start low dose Lovenox if platelets stable.   Monitor CBC on anticoagulation  CBC Latest Ref Rng & Units 07/29/2021 07/28/2021 07/27/2021  WBC 4.0 - 10.5 K/uL 3.1(L) 3.3(L) 2.9(L)  Hemoglobin  13.0 - 17.0 g/dL 8.3(L) 9.2(L) 8.8(L)  Hematocrit 39.0 - 52.0 % 26.3(L) 30.0(L) 28.7(L)  Platelets 150 - 400 K/uL 139(L) 149(L) 136(L)    3/1- Hb stable- co't regimen 12. Thrombocytopenia: Platelets continue to fluctuate.   2/16- Plts up to 166k  2/20- Plts down to 111k- will recheck Thursday  2/27- Plts 136- slightly better 13.  T2DM with hyperglycemia:  Check Hgb A1c in am. Will monitor BS ac/hs with SSI for elevated BS             Add 2U Semglee HS             --continue tube feeds at nights (likely contributing to diarrhea)     2/17- Pt CBGs are running 200sto300s- will increase Semglee to 6 units nightly- from 2 units and monitor for trend CBG (last 3)  Recent Labs    07/28/21 1649 07/28/21 2127 07/29/21 0609  GLUCAP 153* 146* 106*     2/28- con't Semglee 24 units- and SSI- also might need D5NS in Am since will be NPO for surgery and don't want him to drop.   3/1- Much better CBGs control- con't regimen 14. R RTC injury- probably partial tear-   2/16- sustained in [facility]- will write note above HOB to remind staff to protect R shoulder.  15. Extensive B/L DVTs-   2/17- will check CBC this weekend, since recent hx of GI bleed. Don't want pt having PE due to trach and resp failure requiring trach.   2/23- Hb stable- however will con't lovenox until have ot switch to warfarin after surgery 16. Necrotizing fasciitis of LLE  2/21- looks  less beefy and deeper red- concerning to me with more slough- called Gen surg, then ortho- they will look at pt, however might need Plastics--   2/22- plastics says pt ready for skin grafts- could do 3/1- but I am concerned pt will need to leave rehab to get surgical intervention- still d/w plastics  2/23- will arrange to go back to acute on 3/1 after surgery  2/27- surgery Wednesday- will see if they can also debride Buttock pressure ulcer that's unstageable vs Stage IV 17.  Thrombocytopenia  Platelets 122 on 2/24  2/28- plts 149 18.  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 Constipation  2/28- will give sorbitol 30cc since been 4 days  3/1- LBM yesterday with sorbitol  I spent a total of 32   minutes on total care today- >50% coordination of care- due to d/w PA about surgery.     LOS: 14 days A FACE TO FACE EVALUATION WAS PERFORMED  Tony Young 07/29/2021, 7:48 AM

## 2021-07-29 NOTE — Anesthesia Postprocedure Evaluation (Signed)
Anesthesia Post Note ? ?Patient: Tony Young ? ?Procedure(s) Performed: DEBRIDEMENT  OF LEFT LOWER LEG WOUND (Left: Leg Lower) ?SKIN GRAFT SPLIT THICKNESS LEFT LOWER LEG (Left: Leg Lower) ?APPLICATION OF WOUND VAC (Left: Leg Lower) ?DEBRIDEMENT LEFT BUTTOCK SACRAL WOUND (Left: Buttocks) ? ?  ? ?Patient location during evaluation: PACU ?Anesthesia Type: General ?Level of consciousness: awake and alert, patient cooperative and oriented ?Pain management: pain level controlled ?Vital Signs Assessment: post-procedure vital signs reviewed and stable ?Respiratory status: spontaneous breathing, nonlabored ventilation, respiratory function stable and patient connected to tracheostomy mask oxygen ?Cardiovascular status: blood pressure returned to baseline and stable ?Postop Assessment: no apparent nausea or vomiting ?Anesthetic complications: no ? ? ?No notable events documented. ? ?Last Vitals:  ?Vitals:  ? 07/29/21 1808  ?BP: 101/61  ?Pulse: 98  ?Resp: 18  ?Temp: (!) 36.3 ?C  ?SpO2: 93%  ?  ?Last Pain:  ?Vitals:  ? 07/29/21 1808  ?PainSc: 0-No pain  ? ? ?  ?  ?  ?  ?  ?  ? ?Zelpha Messing,E. Beckey Polkowski ? ? ? ? ?

## 2021-07-29 NOTE — Assessment & Plan Note (Addendum)
Trach care, 28% O2 at night time ?Placed 12/27 ?

## 2021-07-29 NOTE — Interval H&P Note (Signed)
History and Physical Interval Note: ? ?07/29/2021 ?2:58 PM ? ?Tony Young  has presented today for surgery, with the diagnosis of Leg Wound.  The various methods of treatment have been discussed with the patient and family. After consideration of risks, benefits and other options for treatment, the patient has consented to  Procedure(s) with comments: ?IRRIGATION AND DEBRIDEMENT WOUND,DEBRIDEMENT OF LEFT SACRAL WOUND (Left) - 1.5 hour ?SKIN GRAFT SPLIT THICKNESS (Left) ?APPLICATION OF SKIN SUBSTITUTE (Left) as a surgical intervention.  The patient's history has been reviewed, patient examined, no change in status, stable for surgery.  I have reviewed the patient's chart and labs.  Questions were answered to the patient's satisfaction.   ? ? ?Lennice Sites ? ? ?

## 2021-07-29 NOTE — Assessment & Plan Note (Addendum)
SP DEBREDEMENT AND graft, by Plastics ?Wound vac in place cont care as per plastics ?Finished Iv Antibioics  ?

## 2021-07-29 NOTE — Transfer of Care (Signed)
Immediate Anesthesia Transfer of Care Note ? ?Patient: Tony Young ? ?Procedure(s) Performed: DEBRIDEMENT  OF LEFT LOWER LEG WOUND (Left: Leg Lower) ?SKIN GRAFT SPLIT THICKNESS LEFT LOWER LEG (Left: Leg Lower) ?APPLICATION OF WOUND VAC (Left: Leg Lower) ?DEBRIDEMENT LEFT BUTTOCK SACRAL WOUND (Left: Buttocks) ? ?Patient Location: PACU ? ?Anesthesia Type:General ? ?Level of Consciousness: drowsy ? ?Airway & Oxygen Therapy: Patient Spontanous Breathing and Patient connected to tracheostomy mask oxygen ? ?Post-op Assessment: Report given to RN and Post -op Vital signs reviewed and stable ? ?Post vital signs: Reviewed and stable ? ?Last Vitals:  ?Vitals Value Taken Time  ?BP 101/61 07/29/21 1808  ?Temp    ?Pulse 119 07/29/21 1809  ?Resp 16 07/29/21 1809  ?SpO2 94 % 07/29/21 1809  ?Vitals shown include unvalidated device data. ? ?Last Pain: There were no vitals filed for this visit.   ? ?  ? ?Complications: No notable events documented. ?

## 2021-07-29 NOTE — H&P (Signed)
Tony Young HAL:[id number] DOB: [birth year] DOA: 07/29/2021     PCP: Rosaria Ferries, MD   Outpatient Specialists:   CARDS: Floria Raveling, PA      Pulmonary Mellody Dance, NP       Patient arrived to ER on  at  Referred by Attending Toy Baker, MD   Patient coming from:     From facility rehab  Chief Complaint: leg wound   HPI: Tony Young is a 66 y.o. male with medical history significant of  CHF COPD CAD diabetes mellitus , CAD, COPD    Presented with   needing OR dbridement No new subjective & objective note has been filed under this hospital service since the last note was generated. Initially admitted in [place] with necrotizing fasciitis of left lower extremity.  Sp  fasciotomy and IV antibiotics however had significant complications with development of acute renal failure respiratory failure sepsis and shock.  The patient ended up on mechanical ventilation and s/p trac and PEG and transferred to Rehab unit. Now on RA trach collar Tolerating mechanical soft diet.  Transferred to [facility] for  I&D of the left leg and sacral decube   Initial COVID TEST  NEGATIVE   Lab Results  Component Value Date   [place] NEGATIVE 07/29/2021     Regarding pertinent Chronic problems:      Adrenal insufficiency on prednisone taper  Dysphagia - 3, thins liquids. Assistance with meals/set up and full supervision for safety.   Sp [place] and PEG care. Use PMSV during the day. Remove PMSV and use ATC 28% via at bedtime.     chronic CHF diastolic/systolic/ combined - last echo Feb 2023 EF 40 to 45%    CAD  -                  -  followed by cardiology                 DM 2 -  Lab Results  Component Value Date   HGBA1C 5.9 (H) 07/16/2021   on insulin,  ac/hs CBG checks and SSI was use prn for tighter BS control.    Hypothyroidism:  Lab Results  Component Value Date   TSH 3.417 07/01/2021   on synthroid    Morbid  obesity-   BMI Readings from Last 1 Encounters:  07/29/21 39.16 kg/m    OSA - now sp trach    A. Fib -  - CHA2DS2 vas score    5       current  on anticoagulation with heparin drip          -  Rate control:  Currently controlled with  Metoprolol          - Rhythm control:   amiodarone,    Hx of DVT/PE on - anticoagulation with heparin   CKD stage IIIa- baseline Cr 1.4 Estimated Creatinine Clearance: 69.2 mL/min (A) (by C-G formula based on SCr of 1.28 mg/dL (H)).  Lab Results  Component Value Date   CREATININE 1.28 (H) 07/29/2021   CREATININE 1.31 (H) 07/27/2021   CREATININE 1.47 (H) 07/24/2021    Chronic anemia - baseline hg Hemoglobin & Hematocrit  Recent Labs    07/27/21 0722 07/28/21 0503 07/29/21 0618  HGB 8.8* 9.2* 8.3*     Following Medications were ordered in ER: Medications  lactated ringers infusion ( Intravenous New Bag/Given 07/29/21 1658)  insulin aspart (novoLOG) injection 0-14 Units (0  Units Subcutaneous Not Given 07/29/21 1501)  mineral oil light external liquid (sterile) (has no administration in time range)  mineral oil light external liquid (sterile) (has no administration in time range)  mineral oil light external liquid (sterile) (has no administration in time range)  oxyCODONE (Oxy IR/ROXICODONE) immediate release tablet 5 mg (has no administration in time range)    Or  oxyCODONE (ROXICODONE) 5 MG/5ML solution 5 mg (has no administration in time range)  HYDROmorphone (DILAUDID) injection 0.25-0.5 mg (0.25 mg Intravenous Given 07/29/21 1837)  ondansetron (ZOFRAN) injection 4 mg (has no administration in time range)  amisulpride (BARHEMSYS) injection 10 mg (has no administration in time range)  amiodarone (PACERONE) tablet 200 mg (has no administration in time range)  busPIRone (BUSPAR) tablet 7.5 mg (has no administration in time range)  multivitamin (RENA-VIT) tablet 1 tablet (has no administration in time range)  Carboxymethylcellulose Sod PF 0.5 %  SOLN 1 drop (has no administration in time range)  Vitamin D3 CAPS 1,000 Units (has no administration in time range)  ferrous sulfate 300 (60 Fe) MG/5ML syrup 300 mg (has no administration in time range)  levothyroxine (SYNTHROID) tablet 75 mcg (has no administration in time range)  loratadine (CLARITIN) tablet 10 mg (has no administration in time range)  metoprolol tartrate (LOPRESSOR) tablet 50 mg (has no administration in time range)  Melatonin CAPS 3 mg (has no administration in time range)  pantoprazole sodium (PROTONIX) 40 mg/20 mL oral suspension 40 mg (has no administration in time range)  predniSONE (DELTASONE) tablet 10 mg (has no administration in time range)  pregabalin (LYRICA) capsule 50 mg (has no administration in time range)  risperiDONE (RISPERDAL) tablet 1 mg (has no administration in time range)  sertraline (ZOLOFT) tablet 100 mg (has no administration in time range)  ascorbic acid (VITAMIN C) tablet 500 mg (has no administration in time range)  amantadine (SYMMETREL) 50 MG/5ML solution 50 mg (has no administration in time range)  furosemide (LASIX) tablet 20 mg (has no administration in time range)  oxyCODONE (Oxy IR/ROXICODONE) immediate release tablet 5 mg (has no administration in time range)  HYDROmorphone (DILAUDID) injection 0.5 mg (has no administration in time range)  heparin ADULT infusion 100 units/mL (25000 units/220mL) (has no administration in time range)  0.9 %  sodium chloride infusion (has no administration in time range)  insulin aspart (novoLOG) injection 0-9 Units (has no administration in time range)  chlorhexidine (PERIDEX) 0.12 % solution 15 mL (15 mLs Mouth/Throat Given 07/29/21 1509)    Or  MEDLINE mouth rinse ( Mouth Rinse See Alternative 07/29/21 1509)  0.9 %  sodium chloride infusion ( Intravenous Stopped 07/29/21 1739)    _________    ED Triage Vitals [07/29/21 1808]  Enc Vitals Group     BP 101/61     Pulse Rate 98     Resp 18     Temp (!)  97.3 F (36.3 C)     Temp src      SpO2 93 %     Weight      Height      Head Circumference      Peak Flow      Pain Score 0     Pain Loc      Pain Edu?      Excl. in [place]?   EPPI(95)@     _________________________________________ Significant initial  Findings: Abnormal Labs Reviewed  GLUCOSE, CAPILLARY - Abnormal; Notable for the following components:      Result  Value   Glucose-Capillary 171 (*)    All other components within normal limits       ECG: Ordered Personally reviewed by me showing: HR : 113 Rhythm: Atrial fibrillation with rapid ventricular response Nonspecific T wave abnormality Abnormal ECG When compared with ECG of 16-Jul-2021 11:53, No significant change was found QTC 473     The recent clinical data is shown below. Vitals:   07/29/21 1808 07/29/21 1823 07/29/21 1838 07/29/21 1853  BP: 101/61 (!) 85/53 (!) 90/59 99/65  Pulse: 98 94 (!) 127 (!) 54  Resp: 18 13 15 14   Temp: (!) 97.3 F (36.3 C)     SpO2: 93% 100% 94% 93%    WBC     Component Value Date/Time   WBC 3.1 (L) 07/29/2021 0618   LYMPHSABS 0.7 07/29/2021 0618   MONOABS 0.4 07/29/2021 0618   EOSABS 0.1 07/29/2021 0618   BASOSABS 0.0 07/29/2021 0618     Lactic Acid, Venous    Component Value Date/Time   LATICACIDVEN 1.9 07/01/2021 0803       Results for orders placed or performed during the hospital encounter of 07/15/21  SARS Coronavirus 2 by RT PCR (hospital order, performed in [place] hospital lab) Nasopharyngeal Nasopharyngeal Swab     Status: None   Collection Time: 07/29/21  8:36 AM   Specimen: Nasopharyngeal Swab  Result Value Ref Range Status   SARS Coronavirus 2 NEGATIVE NEGATIVE Final          _______________________________________________ Hospitalist was called for admission for leg wound A.fib w rvr  The following Work up has been ordered so far:  Orders Placed This Encounter  Procedures   Glucose, capillary   Heparin level (unfractionated)   CBC    Hemoglobin A1c   Vital signs Per Protocol   Notify physician (specify)   Diet Per Protocol   Pre-admission testing diagnosis   Medication Instructions Per Protocol   Discharge instructions   Void   IV Fluids Per Protocol   May use Lidocaine 1% - 2% subcutaneously prior to IV start if patient not allergic to Lidocaine   Follow Diabetes Medication Adjustment Guidelines Prior to Procedure and Surgery   Anesthesia Preoperative Order   Informed Consent Details: Physician/Practitioner Attestation; Transcribe to consent form and obtain patient signature   Discharge  per PACU criteria   Vital signs   Cardiac monitoring   Notify physician (specify)   May continue current IVF if bag empty and / or patient is nauseated and no further IV orders are written   Vital signs per PACU   STAT CBG when hypoglycemia is suspected. If treated, recheck every 15 minutes after each treatment until CBG >/= 70 mg/dl   Refer to Hypoglycemia Protocol Sidebar Report for treatment of CBG < 70 mg/dl   No basal insulin at this time   No meal coverage at this time.   Cardiac monitoring   STAT CBG when hypoglycemia is suspected. If treated, recheck every 15 minutes after each treatment until CBG >/= 70 mg/dl   Refer to Hypoglycemia Protocol Sidebar Report for treatment of CBG < 70 mg/dl   heparin per pharmacy consult   Continuous pulse oximetry   Oxygen therapy Mode or (Route): Nasal cannula; Keep 02 saturation: > 92%   Pulse oximetry, continuous   Oxygen therapy Mode or (Route): Nasal cannula; FiO2: OTHER SEE COMMENTS; Liters Per Minute: 2; Keep 02 saturation: greater than or equal to 92 %   Prepare RBC (crossmatch)  ABO/Rh   Type and screen   Prepare RBC (crossmatch)   Insert and maintain IV line   Admit to Inpatient (patient's expected length of stay will be greater than 2 midnights or inpatient only procedure)     OTHER Significant initial  Findings:  labs showing:    Recent Labs  Lab 07/24/21 0248  07/27/21 0722 07/29/21 0618  NA 133* 135 136  K 4.6 3.1* 3.5  CO2 21* 26 24  GLUCOSE 173* 95 111*  BUN 18 17 22   CREATININE 1.47* 1.31* 1.28*  CALCIUM 8.0* 8.2* 8.4*    Cr   stable,    Lab Results  Component Value Date   CREATININE 1.28 (H) 07/29/2021   CREATININE 1.31 (H) 07/27/2021   CREATININE 1.47 (H) 07/24/2021    No results for input(s): AST, ALT, ALKPHOS, BILITOT, PROT, ALBUMIN in the last 168 hours. Lab Results  Component Value Date   CALCIUM 8.4 (L) 07/29/2021   PHOS 3.8 07/14/2021       Plt: Lab Results  Component Value Date   PLT 139 (L) 07/29/2021      COVID-19 Labs  No results for input(s): DDIMER, FERRITIN, LDH, CRP in the last 72 hours.  Lab Results  Component Value Date   [place] NEGATIVE 07/29/2021       Recent Labs  Lab 07/24/21 0248 07/27/21 0722 07/28/21 0503 07/29/21 0618  WBC 4.2 2.9* 3.3* 3.1*  NEUTROABS 2.4 1.8  --  1.8  HGB 8.6* 8.8* 9.2* 8.3*  HCT 27.0* 28.7* 30.0* 26.3*  MCV 88.8 89.7 89.8 88.9  PLT 122* 136* 149* 139*    HG/HCT  Down   from baseline see below    Component Value Date/Time   HGB 8.3 (L) 07/29/2021 0618   HCT 26.3 (L) 07/29/2021 0618   MCV 88.9 07/29/2021 0618     DM  labs:  HbA1C: Recent Labs    07/16/21 0612  HGBA1C 5.9*       CBG (last 3)  Recent Labs    07/29/21 1201 07/29/21 1401 07/29/21 1812  GLUCAP 103* 100* 171*    Cultures:    Component Value Date/Time   SDES TRACHEAL ASPIRATE 06/30/2021 0940   SPECREQUEST NONE 06/30/2021 0940   CULT  06/30/2021 0940    MODERATE STAPHYLOCOCCUS AUREUS RARE CANDIDA AURIS Performed at [facility], [place] [address] [place], [place] [address]    REPTSTATUS 07/03/2021 FINAL 06/30/2021 0940     Radiological Exams on Admission: No results found. _______________________________________________________________________________________________________ Latest  Blood pressure 99/65, pulse (!) 54, temperature (!) 97.3 F (36.3 C), resp.  rate 14, SpO2 93 %.   Vitals  labs and radiology finding personally reviewed  Review of Systems:    Pertinent positives include:  fatigue, leg pain  Constitutional:  No weight loss, night sweats, Fevers, chills, weight loss  HEENT:  No headaches, Difficulty swallowing,Tooth/dental problems,Sore throat,  No sneezing, itching, ear ache, nasal congestion, post nasal drip,  Cardio-vascular:  No chest pain, Orthopnea, PND, anasarca, dizziness, palpitations.no Bilateral lower extremity swelling  GI:  No heartburn, indigestion, abdominal pain, nausea, vomiting, diarrhea, change in bowel habits, loss of appetite, melena, blood in stool, hematemesis Resp:  no shortness of breath at rest. No dyspnea on exertion, No excess mucus, no productive cough, No non-productive cough, No coughing up of blood.No change in color of mucus.No wheezing. Skin:  no rash or lesions. No jaundice GU:  no dysuria, change in color of urine, no urgency or frequency. No straining  to urinate.  No flank pain.  Musculoskeletal:  No joint pain or no joint swelling. No decreased range of motion. No back pain.  Psych:  No change in mood or affect. No depression or anxiety. No memory loss.  Neuro: no localizing neurological complaints, no tingling, no weakness, no double vision, no gait abnormality, no slurred speech, no confusion  All systems reviewed and apart from [place] all are negative _______________________________________________________________________________________________ Past Medical History:   Past Medical History:  Diagnosis Date   CAD (coronary artery disease)    Cardiomyopathy ([place])    Colon cancer (HCC)    COPD (chronic obstructive pulmonary disease) (HCC)    OSA (obstructive sleep apnea)    Type 2 diabetes mellitus ([place])      Past Surgical History:  Procedure Laterality Date   COLON RESECTION     IR REMOVAL TUN CV CATH W/O FL  07/01/2021   PEG PLACEMENT  05/26/2021   TRACHEOSTOMY  05/26/2021     Social History:  Ambulatory  bed bound      reports that he has never smoked. He has never used smokeless tobacco. He reports that he does not drink alcohol and does not use drugs.   Family History:  Family History  Problem Relation Age of Onset   Cancer Father    Diabetes Maternal Grandfather    _________________________________ Allergies: Allergies  Allergen Reactions   Statins Other (See Comments)    Made hands swell   Penicillins Nausea And Vomiting and Other (See Comments)    Other reaction(s): GI intolerance Other reaction(s): Nausea And Vomiting, Other (See Comments), Other (see comments) Other reaction(s): Other (See Comments) Other reaction(s): Nausea And Vomiting, Other (See Comments), Other (see comments) Other reaction(s): Nausea And Vomiting, Other (See Comments), Other (see comments)    Ropinirole Other (See Comments)     Prior to Admission medications   Medication Sig Start Date End Date Taking? Authorizing Provider  amantadine (SYMMETREL) 100 MG capsule Take 50 mg by mouth daily.    [provider]  amiodarone (PACERONE) 200 MG tablet Take 200 mg by mouth daily.    [provider]  b complex-vitamin c-folic acid (NEPHRO-VITE) 0.8 MG TABS tablet Take 1 tablet by mouth daily.    [provider]  busPIRone (BUSPAR) 5 MG tablet Take 7.5 mg by mouth 2 (two) times daily.    [provider]  Carboxymethylcellulose Sod PF 0.5 % SOLN Place 1 drop into both eyes in the morning, at noon, and at bedtime.    [provider]  Cholecalciferol (VITAMIN D3) 25 MCG (1000 UT) CAPS Take 1,000 Units by mouth daily.    [provider]  ferrous sulfate 300 (60 Fe) MG/5ML syrup Take 300 mg by mouth daily.    [provider]  fiber (NUTRISOURCE FIBER) PACK packet Take 1 packet by mouth in the morning, at noon, and at bedtime.    [provider]  insulin glargine (LANTUS) 100 UNIT/ML injection Inject 28 Units  into the skin 2 (two) times daily.    [provider]  insulin lispro (HUMALOG) 100 UNIT/ML injection Inject 1 Units into the skin every 6 (six) hours. Per sliding scale    [provider]  ipratropium-albuterol (DUONEB) 0.5-2.5 (3) MG/3ML SOLN Take 3 mLs by nebulization in the morning, at noon, and at bedtime.    [provider]  latanoprost (XALATAN) 0.005 % ophthalmic solution Place 1 drop into both eyes at bedtime.    [provider]  levothyroxine (SYNTHROID) 75 MCG tablet Take 75 mcg by mouth daily before breakfast.    [provider]  loratadine (CLARITIN) 10 MG tablet Take 10 mg by mouth daily.    [provider]  Melatonin 3 MG CAPS Take 3 mg by mouth at bedtime.    [provider]  metoprolol tartrate (LOPRESSOR) 50 MG tablet Take 50 mg by mouth 2 (two) times daily.    [provider]  Metoprolol Tartrate (METOPROLOL TARTARATE 1 MG/ML SYRINGE, 5ML,) Inject 2.5 mg into the vein every 6 (six) hours as needed (high blood pressure).    [provider]  modafinil (PROVIGIL) 100 MG tablet Take 100 mg by mouth daily.    [provider]  nutrition supplement, JUVEN, (JUVEN) PACK Take 1 packet by mouth in the morning, at noon, and at bedtime.    [provider]  Omega-3 Fatty Acids (FISH OIL) 1000 MG CAPS Take 4,000 mg by mouth daily.    [provider]  pantoprazole sodium (PROTONIX) 40 mg/20 mL SUSP Take 40 mg by mouth daily.    [provider]  potassium chloride (KLOR-CON) 20 MEQ packet Take 40 mEq by mouth every 4 (four) hours as needed (low potassium).    [provider]  predniSONE (DELTASONE) 20 MG tablet Take 20 mg by mouth daily with breakfast.    [provider]  pregabalin (LYRICA) 50 MG capsule Take 50 mg by mouth daily.    [provider]  primidone (MYSOLINE) 50 MG tablet Take 50 mg by mouth daily.    [provider]  risperiDONE  (RISPERDAL) 1 MG tablet Take 1 mg by mouth 2 (two) times daily.    [provider]  sertraline (ZOLOFT) 50 MG tablet Take 100 mg by mouth at bedtime.    [provider]  vitamin C (ASCORBIC ACID) 500 MG tablet Take 500 mg by mouth daily.    [provider]    ___________________________________________________________________________________________________ Physical Exam: Vitals with BMI 07/29/2021 07/29/2021 07/29/2021  Height - - -  Weight - - -  BMI - - -  Systolic 99 90 85  Diastolic 65 59 53  Pulse 54 127 94     1. General:  in No  Acute distress   Chronically ill   -appearing 2. Psychological: Alert and  Oriented 3. Head/ENT:    Dry Mucous Membranes                          Head Non traumatic, neck supple                           Poor Dentition 4. SKIN:  decreased Skin turgor,  Skin clean Dry and intact no rash 5. Heart: Regular rate and rhythm no  Murmur, no Rub or gallop 6. Lungs:  no wheezes or crackles   7. Abdomen: Soft,  non-tender, Non distended   obese   8. Lower extremities: no clubbing, cyanosis,  trace edema 9. Neurologically Grossly intact, moving all 4 extremities equally   10. MSK: Normal range of motion    Chart has been reviewed  ______________________________________________________________________________________________  Assessment/Plan  66 y.o. male with medical history significant of  CHF COPD CAD diabetes mellitus , CAD, COPD Admitted for wound care And A.fib w RVR Present on Admission:  Open leg wound  Chronic combined systolic and diastolic CHF (congestive heart failure) (HCC)  OSA (obstructive sleep  apnea)  Dvt femoral (deep venous thrombosis) (HCC)  Necrotizing fasciitis of lower leg (HCC)  Paroxysmal atrial fibrillation with RVR (HCC)  CKD stage 2 due to type 2 diabetes mellitus (HCC)  Anemia due to chronic kidney disease     Open leg wound SP DEBREDEMENT AND graft, by Plastics Wound vac in place cont care as  per plastics Finished Iv Antibioics   Tracheostomy status ([place]) Trach care, 28% O2 at night time Placed 12/27  Chronic combined systolic and diastolic CHF (congestive heart failure) (HCC) - currently appears to be slightly on the dry side, hold home diuretics for tonight and restart when appears euvolemic, carefuly follow fluid status and Cr   Controlled type 2 diabetes mellitus with hyperglycemia, with long-term current use of insulin (HCC)  - Order Sensitive   SSI   - continue home insulin regimen  Decrease to   Lantus  15 units,  -  check TSH and HgA1C  - Hold by mouth medications     OSA (obstructive sleep apnea) Now sp trach  Dvt femoral (deep venous thrombosis) (HCC) Continue heparin drip, no bolus  Necrotizing fasciitis of lower leg (HCC) Sp ABX now sp debredement and repair by plastics Now SP  IRRIGATION AND DEBRIDEMENT WOUND (Left) SKIN GRAFT SPLIT THICKNESS (Left) APPLICATION OF SKIN SUBSTITUTE (Left) as a surgical intervention.   On 07/29/2021 by Lennice Sites  Paroxysmal atrial fibrillation with RVR ([place]) Restart Amiodarone restart metoprolol but at decreased dose with holding parameters Given soft bp  CKD stage 2 due to type 2 diabetes mellitus ([place])  -chronic avoid nephrotoxic medications such as NSAIDs, Vanco Zosyn combo,  avoid hypotension, continue to follow renal function   Anemia due to chronic kidney disease Pt was transfused 1 unit in OR will repeat CBC   Other plan as per orders.  DVT prophylaxis:  heparin drip    Code Status:    Code Status: Prior FULL CODE  as per patient   I had personally discussed CODE STATUS with patient     Family Communication:   Family not at  Bedside    Disposition Plan:  may need SNF   Following barriers for discharge:                                                           Anemia stable                             Pain controlled with PO medications        Will need to be able to tolerate PO                                                       Will need consultants to evaluate patient prior to discharge                        Would benefit from PT/OT eval prior to DC  Ordered  Transition of care consulted                   Nutrition    consulted                  Wound care  consulted                                       Consults called:  plastics are aware     inpatient     I Expect 2 midnight stay secondary to severity of patient's current illness need for inpatient interventions justified by the following:  hemodynamic instability despite optimal treatment (tachycardia  hypotension )  Severe lab/radiological/exam abnormalities including:     and extensive comorbidities including:  DM2   CHF   CAD  COPD/asthma  Morbid Obesity  CKD   That are currently affecting medical management.   I expect  patient to be hospitalized for 2 midnights requiring inpatient medical care.  Patient is at high risk for adverse outcome (such as loss of life or disability) if not treated.   Need for operative/procedural  intervention    Need for IV fluids,     Level of care   progressive tele indefinitely please discontinue once patient no longer qualifies COVID-19 Labs    Lab Results  Component Value Date   [place] 07/29/2021     Precautions: admitted as   Covid Negative   Tony Young 07/29/2021, 8:29 PM    [facility]     after 2 AM please page floor coverage PA If 7AM-7PM, please contact the day team taking care of the patient using [email or web address]   Patient was evaluated in the context of the global COVID-19 pandemic, which necessitated consideration that the patient might be at risk for infection with the SARS-CoV-2 virus that causes COVID-19. Institutional protocols and algorithms that pertain to the evaluation of patients at risk for COVID-19 are in a state of rapid change based on information released by regulatory  bodies including the CDC and federal and state organizations. These policies and algorithms were followed during the patient's care.

## 2021-07-29 NOTE — Assessment & Plan Note (Signed)
-  chronic avoid nephrotoxic medications such as NSAIDs, Vanco Zosyn combo,  avoid hypotension, continue to follow renal function  

## 2021-07-29 NOTE — Assessment & Plan Note (Signed)
Restart Amiodarone ?restart metoprolol but at decreased dose with holding parameters ?Given soft bp ?

## 2021-07-29 NOTE — Anesthesia Preprocedure Evaluation (Addendum)
Anesthesia Evaluation  ?Patient identified by MRN, date of birth, ID band ?Patient awake ? ? ? ?Reviewed: ?Allergy & Precautions, NPO status , Patient's Chart, lab work & pertinent test results, reviewed documented beta blocker date and time  ? ?Airway ?Mallampati: Trach ? ?TM Distance: >3 FB ?Neck ROM: Full ? ? ? Dental ? ?(+) Edentulous Upper, Edentulous Lower ?  ?Pulmonary ?sleep apnea , COPD,  COPD inhaler,  ?Trach 05/26/21: Tracheostomy [place] Flexible 6 mm Uncuffed for chronic hypoxic resp failure  ?  ? ?+ decreased breath sounds ? ? ? ? ? Cardiovascular ?hypertension, Pt. on medications and Pt. on home beta blockers ?+ CAD, +CHF (LVEF 40-45%) and + DVT (hep gtt)  ?Normal cardiovascular exam+ dysrhythmias Atrial Fibrillation  ?Rhythm:Regular Rate:Normal ? ?Echo 07/2021: ??1. Left ventricular ejection fraction, by estimation, is 40 to 45%. The  ?left ventricle has mildly decreased function. Left ventricular endocardial  ?border not optimally defined to evaluate regional wall motion. There is  ?mild left ventricular hypertrophy.  ??Left ventricular diastolic parameters are indeterminate.  ??2. Right ventricular systolic function is normal. The right ventricular  ?size is normal.  ??3. The mitral valve is normal in structure. No evidence of mitral valve  ?regurgitation. No evidence of mitral stenosis.  ??4. The aortic valve is calcified. There is mild calcification of the  ?aortic valve. There is mild thickening of the aortic valve. Aortic valve  ?regurgitation is not visualized. Aortic valve sclerosis is present, with  ?no evidence of aortic valve stenosis.  ??5. The inferior vena cava is normal in size with greater than 50%  ?respiratory variability, suggesting right atrial pressure of 3 mmHg.  ?  ?Neuro/Psych ?negative neurological ROS ? negative psych ROS  ? GI/Hepatic ?Neg liver ROS, GERD  Medicated and Controlled,  ?Endo/Other  ?diabetes, Well Controlled, Type 2, Insulin  DependentHypothyroidism a1 5.9 ?FS 100 at 14:30 ? Renal/GU ?Renal InsufficiencyRenal diseaseCr 1.28  ?negative genitourinary ?  ?Musculoskeletal ?Sacral wound ? ?Direct admit from Rehab. Miquel Dunn, with Hx CHF COPD CAD diabetes mellitus who came a hospital in [place] because of a diagnosis of necrotizing fasciitis of left lower extremity.  Patient underwent fasciotomy and IV antibiotics however had significant complications with development of acute renal failure respiratory failure sepsis and shock.  The patient ended up on mechanical ventilation and s/p trac and PEG and transferred to Rehab unit. Now patient has been off ventilator   ? Abdominal ?(+) + obese,   ?Peds ? Hematology ? ?(+) Blood dyscrasia, anemia , Hb 8.3, plt 139   ?Anesthesia Other Findings ? ? Reproductive/Obstetrics ?negative OB ROS ? ?  ? ? ? ? ? ? ? ? ? ? ? ? ? ?  ?  ? ? ? ? ? ? ? ?Anesthesia Physical ?Anesthesia Plan ? ?ASA: 4 ? ?Anesthesia Plan: General  ? ?Post-op Pain Management: Ofirmev IV (intra-op)*  ? ?Induction: Intravenous ? ?PONV Risk Score and Plan: 2 and Ondansetron, Midazolam and Treatment may vary due to age or medical condition ? ?Airway Management Planned: Tracheostomy ? ?Additional Equipment: None ? ?Intra-op Plan:  ? ?Post-operative Plan: Extubation in OR ? ?Informed Consent: I have reviewed the patients History and Physical, chart, labs and discussed the procedure including the risks, benefits and alternatives for the proposed anesthesia with the patient or authorized representative who has indicated his/her understanding and acceptance.  ? ? ? ?Dental advisory given ? ?Plan Discussed with: CRNA ? ?Anesthesia Plan Comments: (Will exchange uncuffed trach for cuffed ETT and back  at the end of case ?Prone vs lithotomy)  ? ? ? ? ? ?Anesthesia Quick Evaluation ? ?

## 2021-07-29 NOTE — Progress Notes (Signed)
Per PACU RN ok to release order for tylenol and give to patient per peg when they will call for him to OR. ?

## 2021-07-29 NOTE — Assessment & Plan Note (Signed)
Continue heparin drip, no bolus ?

## 2021-07-29 NOTE — Assessment & Plan Note (Addendum)
Sp ABX now sp debredement and repair by plastics ?Now SP  ?IRRIGATION AND DEBRIDEMENT WOUND (Left) ?SKIN GRAFT SPLIT THICKNESS (Left) ?APPLICATION OF SKIN SUBSTITUTE (Left) as a surgical intervention.   ?On 07/29/2021 by Lennice Sites ?

## 2021-07-29 NOTE — Progress Notes (Signed)
ANTICOAGULATION CONSULT NOTE - follow-up ? ?Pharmacy Consult for transition from Lovenox to Heparin infusion ?Indication: DVT ? ?Allergies  ?Allergen Reactions  ? Statins Other (See Comments)  ?  Made hands swell  ? Penicillins Nausea And Vomiting and Other (See Comments)  ?  Other reaction(s): GI intolerance ?Other reaction(s): Nausea And Vomiting, Other (See Comments), Other (see comments) ?Other reaction(s): Other (See Comments) ?Other reaction(s): Nausea And Vomiting, Other (See Comments), Other (see comments) ?Other reaction(s): Nausea And Vomiting, Other (See Comments), Other (see comments) ?  ? Ropinirole Other (See Comments)  ? ? ?Patient Measurements: ?Height: 5\' 7"  (170.2 cm) ?Weight: 113.4 kg (250 lb) ?IBW/kg (Calculated) : 66.1 ? ?Heparin Dosing Weight: 91.7 kg ? ?Vital Signs: ?Temp: 98.5 ?F (36.9 ?C) (03/01 0319) ?Temp Source: Oral (03/01 0319) ?BP: 90/67 (03/01 0319) ?Pulse Rate: 102 (03/01 0735) ? ?Labs: ?Recent Labs  ?  07/27/21 ?0722 07/28/21 ?0503 07/28/21 ?1147 07/29/21 ?0618  ?HGB 8.8* 9.2*  --  8.3*  ?HCT 28.7* 30.0*  --  26.3*  ?PLT 136* 149*  --  139*  ?APTT  --  79* 85* 90*  ?HEPARINUNFRC  --  >1.10* >1.10* 0.99*  ?CREATININE 1.31*  --   --  1.28*  ? ? ? ?Estimated Creatinine Clearance: 69.2 mL/min (A) (by C-G formula based on SCr of 1.28 mg/dL (H)). ? ? ?Medical History: ?Past Medical History:  ?Diagnosis Date  ? CAD (coronary artery disease)   ? Cardiomyopathy ([place])   ? Colon cancer ([place])   ? COPD (chronic obstructive pulmonary disease) ([place])   ? OSA (obstructive sleep apnea)   ? Type 2 diabetes mellitus ([place])   ? ? ?Medications:  ?Scheduled:  ? amantadine  50 mg Oral Daily  ? amiodarone  200 mg Oral BID  ? vitamin C  500 mg Oral BID  ? busPIRone  7.5 mg Oral BID  ? Chlorhexidine Gluconate Cloth  6 each Topical Once  ? And  ? Chlorhexidine Gluconate Cloth  6 each Topical Once  ? cholecalciferol  1,000 Units Oral Daily  ? ferrous sulfate  300 mg Per Tube BID WC  ? free water  50 mL Per Tube  BID  ? furosemide  20 mg Oral BID  ? insulin aspart  0-15 Units Subcutaneous TID WC  ? insulin aspart  0-5 Units Subcutaneous QHS  ? insulin glargine-yfgn  24 Units Subcutaneous QHS  ? latanoprost  1 drop Both Eyes QHS  ? levothyroxine  75 mcg Oral Q0600  ? loratadine  10 mg Oral Daily  ? melatonin  3 mg Oral QHS  ? metoprolol tartrate  25 mg Oral BID  ? multivitamin with minerals  1 tablet Oral Daily  ? nutrition supplement (JUVEN)  1 packet Per Tube BID BM  ? pantoprazole sodium  40 mg Per Tube BID  ? polycarbophil  625 mg Oral BID  ? predniSONE  10 mg Oral Q breakfast  ? pregabalin  50 mg Oral Daily  ? primidone  50 mg Oral Daily  ? Ensure Max Protein  330 oz Oral QHS  ? risperiDONE  1 mg Oral BID  ? sertraline  100 mg Oral QHS  ? sodium hypochlorite   Topical BID  ? traMADol  50 mg Oral Q6H  ? ?Infusions:  ? heparin 1,500 Units/hr (07/29/21 0728)  ? ? ?Assessment: ?66 year-old male patient admitted to CIR due to functional deficit due to critical illness myopathy. He was diagnosed with an age indeterminate DVT (left femoral vein,,  and SF junction). He was started on therapeutic lovenox at 120 mg [place] q12h. Patient is scheduled to go to the OR with plastic surgery on 07/29/2021 requiring transition to a heparin infusion. ? ?Goal of Therapy:  ?Heparin level 0.3-0.7 units/ml ?aPTT 66-102 seconds ?Monitor platelets by anticoagulation protocol: Yes ?  ?Plan:  ?aPTT: 90 seconds ?Heparin level: >0.99 ?Continue heparin infusion at 1500 units/hr. ?Heparin level continues to be elevated and likely due to influence of Lovenox.  ?aPTT is within the therapeutic range (66-102 seconds) ? ?Continue heparin infusion at 1500 units/hr. ?Check heparin level and aPTT daily while on heparin (AM labs 07/29/2021) ?Continue to monitor H&H and platelets ? ?Jonessa Triplett BS, PharmD, BCPS ?Clinical Pharmacist ?07/29/2021 8:23 AM ? ? ?

## 2021-07-30 ENCOUNTER — Encounter (HOSPITAL_COMMUNITY): Payer: Self-pay | Admitting: Plastic Surgery

## 2021-07-30 DIAGNOSIS — S81802A Unspecified open wound, left lower leg, initial encounter: Secondary | ICD-10-CM | POA: Diagnosis not present

## 2021-07-30 LAB — IRON AND TIBC
Iron: 29 ug/dL — ABNORMAL LOW (ref 45–182)
Saturation Ratios: 12 % — ABNORMAL LOW (ref 17.9–39.5)
TIBC: 245 ug/dL — ABNORMAL LOW (ref 250–450)
UIBC: 216 ug/dL

## 2021-07-30 LAB — CBC WITH DIFFERENTIAL/PLATELET
Abs Immature Granulocytes: 0.2 10*3/uL — ABNORMAL HIGH (ref 0.00–0.07)
Basophils Absolute: 0 10*3/uL (ref 0.0–0.1)
Basophils Relative: 0 %
Eosinophils Absolute: 0 10*3/uL (ref 0.0–0.5)
Eosinophils Relative: 0 %
HCT: 29.1 % — ABNORMAL LOW (ref 39.0–52.0)
Hemoglobin: 9 g/dL — ABNORMAL LOW (ref 13.0–17.0)
Lymphocytes Relative: 2 %
Lymphs Abs: 0.1 10*3/uL — ABNORMAL LOW (ref 0.7–4.0)
MCH: 27.8 pg (ref 26.0–34.0)
MCHC: 30.9 g/dL (ref 30.0–36.0)
MCV: 89.8 fL (ref 80.0–100.0)
Metamyelocytes Relative: 2 %
Monocytes Absolute: 0.2 10*3/uL (ref 0.1–1.0)
Monocytes Relative: 5 %
Myelocytes: 2 %
Neutro Abs: 3.8 10*3/uL (ref 1.7–7.7)
Neutrophils Relative %: 89 %
Platelets: 135 10*3/uL — ABNORMAL LOW (ref 150–400)
RBC: 3.24 MIL/uL — ABNORMAL LOW (ref 4.22–5.81)
RDW: 16.7 % — ABNORMAL HIGH (ref 11.5–15.5)
WBC: 4.3 10*3/uL (ref 4.0–10.5)
nRBC: 0.7 % — ABNORMAL HIGH (ref 0.0–0.2)
nRBC: 3 /100 WBC — ABNORMAL HIGH

## 2021-07-30 LAB — COMPREHENSIVE METABOLIC PANEL
ALT: 14 U/L (ref 0–44)
AST: 12 U/L — ABNORMAL LOW (ref 15–41)
Albumin: 2.5 g/dL — ABNORMAL LOW (ref 3.5–5.0)
Alkaline Phosphatase: 75 U/L (ref 38–126)
Anion gap: 12 (ref 5–15)
BUN: 18 mg/dL (ref 8–23)
CO2: 23 mmol/L (ref 22–32)
Calcium: 8.1 mg/dL — ABNORMAL LOW (ref 8.9–10.3)
Chloride: 102 mmol/L (ref 98–111)
Creatinine, Ser: 1.3 mg/dL — ABNORMAL HIGH (ref 0.61–1.24)
GFR, Estimated: >60 mL/min (ref >60)
Glucose, Bld: 177 mg/dL — ABNORMAL HIGH (ref 70–99)
Potassium: 4.5 mmol/L (ref 3.5–5.1)
Sodium: 137 mmol/L (ref 135–145)
Total Bilirubin: 0.4 mg/dL (ref 0.3–1.2)
Total Protein: 5.6 g/dL — ABNORMAL LOW (ref 6.5–8.1)

## 2021-07-30 LAB — VITAMIN B12: Vitamin B-12: 1580 pg/mL — ABNORMAL HIGH (ref 180–914)

## 2021-07-30 LAB — TSH: TSH: 3.763 u[IU]/mL (ref 0.350–4.500)

## 2021-07-30 LAB — GLUCOSE, CAPILLARY
Glucose-Capillary: 127 mg/dL — ABNORMAL HIGH (ref 70–99)
Glucose-Capillary: 156 mg/dL — ABNORMAL HIGH (ref 70–99)
Glucose-Capillary: 160 mg/dL — ABNORMAL HIGH (ref 70–99)
Glucose-Capillary: 163 mg/dL — ABNORMAL HIGH (ref 70–99)
Glucose-Capillary: 171 mg/dL — ABNORMAL HIGH (ref 70–99)
Glucose-Capillary: 178 mg/dL — ABNORMAL HIGH (ref 70–99)
Glucose-Capillary: 202 mg/dL — ABNORMAL HIGH (ref 70–99)

## 2021-07-30 LAB — MAGNESIUM: Magnesium: 2.1 mg/dL (ref 1.7–2.4)

## 2021-07-30 LAB — FERRITIN: Ferritin: 101 ng/mL (ref 24–336)

## 2021-07-30 LAB — RETICULOCYTES
Immature Retic Fract: 37.7 % — ABNORMAL HIGH (ref 2.3–15.9)
RBC.: 3.22 MIL/uL — ABNORMAL LOW (ref 4.22–5.81)
Retic Count, Absolute: 115.9 10*3/uL (ref 19.0–186.0)
Retic Ct Pct: 3.6 % — ABNORMAL HIGH (ref 0.4–3.1)

## 2021-07-30 LAB — PHOSPHORUS: Phosphorus: 5.3 mg/dL — ABNORMAL HIGH (ref 2.5–4.6)

## 2021-07-30 LAB — HEPARIN LEVEL (UNFRACTIONATED): Heparin Unfractionated: 0.54 IU/mL (ref 0.30–0.70)

## 2021-07-30 LAB — HIV ANTIBODY (ROUTINE TESTING W REFLEX): HIV Screen 4th Generation wRfx: NONREACTIVE

## 2021-07-30 LAB — FOLATE: Folate: 36 ng/mL (ref >5.9)

## 2021-07-30 MED ORDER — CHLORHEXIDINE GLUCONATE 0.12 % MT SOLN
15.0000 mL | Freq: Two times a day (BID) | OROMUCOSAL | Status: DC
  Administered 2021-07-30 – 2021-08-11 (×20): 15 mL via OROMUCOSAL
  Filled 2021-07-30 (×24): qty 15

## 2021-07-30 MED ORDER — ORAL CARE MOUTH RINSE
15.0000 mL | Freq: Two times a day (BID) | OROMUCOSAL | Status: DC
  Administered 2021-07-30 – 2021-08-11 (×23): 15 mL via OROMUCOSAL

## 2021-07-30 MED ORDER — ENSURE ENLIVE PO LIQD
237.0000 mL | Freq: Three times a day (TID) | ORAL | Status: DC
  Administered 2021-07-30 – 2021-08-03 (×6): 237 mL via ORAL

## 2021-07-30 MED ORDER — DEXTROSE 50 % IV SOLN
INTRAVENOUS | Status: AC
  Filled 2021-07-30: qty 50

## 2021-07-30 MED ORDER — ZINC SULFATE 220 (50 ZN) MG PO CAPS
220.0000 mg | ORAL_CAPSULE | Freq: Every day | ORAL | Status: DC
  Administered 2021-07-30 – 2021-08-11 (×13): 220 mg via ORAL
  Filled 2021-07-30 (×13): qty 1

## 2021-07-30 MED ORDER — PANTOPRAZOLE 2 MG/ML SUSPENSION
40.0000 mg | Freq: Two times a day (BID) | ORAL | Status: DC
  Administered 2021-07-31 – 2021-08-11 (×24): 40 mg
  Filled 2021-07-30 (×27): qty 20

## 2021-07-30 NOTE — Progress Notes (Signed)
Initial Nutrition Assessment ? ?DOCUMENTATION CODES:  ? ?Obesity unspecified ? ?INTERVENTION:  ? ?Chocolate Ensure Enlive po TID, each supplement provides 350 kcal and 20 grams of protein. ? ?Would benefit from vitamin C and zinc supplementation to support wound healing, discussed with MD, will order:  ?zinc 220 mg per day x 14 days.  ? ?Continue vitamin C 500 mg BID. ? ?Check vitamin C and zinc levels. ? ?NUTRITION DIAGNOSIS:  ? ?Increased nutrient needs related to wound healing as evidenced by estimated needs. ? ?GOAL:  ? ?Patient will meet greater than or equal to 90% of their needs ? ?MONITOR:  ? ?PO intake, Supplement acceptance, Labs, Skin ? ?REASON FOR ASSESSMENT:  ? ?Consult ?Assessment of nutrition requirement/status ? ?ASSESSMENT:  ? ?66 yo male admitted S/P I&D LLE wound with split thickness skin graft and application of skin substitute. PMH includes CAD, cardiomyopathy, colon cancer S/P colon resection, COPD, OSA, DM-2, PEG, trach. ? ?Patient was admitted to a hospital in [place] with necrotizing fasciitis of LLE. He had a fasciotomy and IV antibiotics, but developed renal failure and shock requiring mechanical ventilation. Eventually had trach and PEG placed. He was admitted to CIR at [facility] on 2/15, discharged for skin graft procedure on 3/1. Now in acute care. During rehab stay, he was eating well and TF had been discontinued. He was receiving Ensure and Juven supplements.  ?Currently on trach collar.  ? ?Patient reports that he was eating PTA, receiving meds via PEG tube. He required assistance with feeding, but was eating well. He has a good appetite now, but has not had anything to eat yet today. RN ordering meal for patient during RD visit. Discussed with patient the importance of good intake of protein, vitamins, and minerals to support wound healing. Patient agreed to drink chocolate Ensure supplements between meals, but does not want to drink the Juven anymore.  ? ?Discussed with attending physician;  patient is receiving vitamin C supplementation; okay to begin zinc supplementation and check vitamin C and zinc levels.  ? ?Labs reviewed. Phos 5.3 ?CBG: [phone] ? ?Medications reviewed and include vitamin C, vitamin D, Colace, ferrous sulfate, novolog, Semglee, MVI with minerals, prednisone. ? ?Weight history reviewed. Weight stable for the past 2 weeks. Current edema could be masking actual weight changes.  ? ?NUTRITION - FOCUSED PHYSICAL EXAM: ? ?Flowsheet Row Most Recent Value  ?Orbital Region No depletion  ?Upper Arm Region No depletion  ?Thoracic and Lumbar Region No depletion  ?Buccal Region No depletion  ?Temple Region No depletion  ?Clavicle Bone Region No depletion  ?Clavicle and Acromion Bone Region No depletion  ?Scapular Bone Region No depletion  ?Dorsal Hand No depletion  ?Patellar Region No depletion  ?Anterior Thigh Region No depletion  ?Posterior Calf Region No depletion  ?Edema (RD Assessment) Mild  ?Hair Reviewed  ?Eyes Reviewed  ?Mouth Reviewed  ?Skin Reviewed  ?Nails Reviewed  ? ?  ? ? ?Diet Order:   ?Diet Order   ? ?       ?  DIET DYS 3 Room service appropriate? Yes; Fluid consistency: Thin  Diet effective now       ?  ? ?  ?  ? ?  ? ? ?EDUCATION NEEDS:  ? ?Education needs have been addressed ? ?Skin:  Skin Integrity Issues:: Wound VAC, Unstageable ?Unstageable: buttocks ?Wound Vac: LLE pretibial wound after necrotizing infection ? ?Last BM:  3/1 type 4 ? ?Height:  ? ?Ht Readings from Last 1 Encounters:  ?07/15/21 5\' 7"  (  1.702 m)  ? ? ?Weight:  ? ?Wt Readings from Last 1 Encounters:  ?07/29/21 113.4 kg  ? ? ?BMI:  39.1 ? ?Estimated Nutritional Needs:  ? ?Kcal:  2300-2500 ? ?Protein:  125-145 gm ? ?Fluid:  >/= 2.4 L ? ? ? ?Lucas Mallow RD, LDN, CNSC ?Please refer to Amion for contact information.                                                       ? ?

## 2021-07-30 NOTE — Evaluation (Signed)
Occupational Therapy Evaluation Patient Details Name: Tony Young MRN: [id number] DOB: 19-Mar-1956 Today's Date: 07/30/2021   History of Present Illness Pt is a 66 y.o. male originally admitted to [place] 05/13/21 with d/c to [facility] 06/10/21, readmission then transfer to CIR on 2/15; pt now admitted 07/29/21 for LLE and sacral wound debridement and skin graft. PMH includes CHF, COPD, CAD, DM, trach, peg.   Clinical Impression   Pt admitted from CIR with max-total assist x2. Pt agreeded to session ut limited due to significant amount of draining in LLE. Pt required max assist with UE ADLS while completion of bed rollling with min to max assist as seen bellow.  Pt currently with functional limitations due to the deficits listed below (see OT Problem List).  Pt will benefit from skilled OT to increase their safety and independence with ADL and functional mobility for ADL to facilitate discharge to venue listed below.        Recommendations for follow up therapy are one component of a multi-disciplinary discharge planning process, led by the attending physician.  Recommendations may be updated based on patient status, additional functional criteria and insurance authorization.   Follow Up Recommendations  Acute inpatient rehab (3hours/day)    Assistance Recommended at Discharge Frequent or constant Supervision/Assistance  Patient can return home with the following A lot of help with bathing/dressing/bathroom;Assistance with cooking/housework;Assistance with feeding;Direct supervision/assist for medications management;Direct supervision/assist for financial management;Assist for transportation    Functional Status Assessment  Patient has had a recent decline in their functional status and demonstrates the ability to make significant improvements in function in a reasonable and predictable amount of time.  Equipment Recommendations   (TBD)    Recommendations for Other Services        Precautions / Restrictions Precautions Precautions: Fall;Other (comment) Precaution Comments: trach, PEG, unable to read, fragile skin; LE/sacral wound drainage Restrictions Weight Bearing Restrictions: No      Mobility Bed Mobility Overal bed mobility: Needs Assistance Bed Mobility: Rolling           General bed mobility comments: MinA to roll towards R side with use of bed rail, pt requesting towel around bed rail to allow hooking L elbow to maintain hold due to limited grip strength. Mod-maxA for full hip rotation rolling towards L side    Transfers                   General transfer comment: deferred secondary to significant wound drainage during bed mobility      Balance                                           ADL either performed or assessed with clinical judgement   ADL Overall ADL's : Needs assistance/impaired Eating/Feeding: Minimal assistance;Cueing for safety;Cueing for sequencing;Sitting   Grooming: Maximal assistance;Cueing for safety;Cueing for sequencing;Bed level   Upper Body Bathing: Maximal assistance;Bed level   Lower Body Bathing: Total assistance;+2 for physical assistance;+2 for safety/equipment;Bed level   Upper Body Dressing : Maximal assistance   Lower Body Dressing: Maximal assistance;+2 for physical assistance;+2 for safety/equipment;Bed level                 General ADL Comments: deffered transfers for next session due to tolerance for activity and signifant amount of draining from wound in bed with jsut rolling     Vision Baseline Vision/History:  1 Wears glasses Ability to See in Adequate Light: 2 Moderately impaired Vision Assessment?:  (Pt noted to be sensative to lights)     Perception     Praxis      Pertinent Vitals/Pain Pain Assessment Pain Assessment: Faces Faces Pain Scale: Hurts little more Breathing: normal Negative Vocalization: none Body Language: relaxed Consolability: no need  to console Pain Location: LLE Pain Descriptors / Indicators: Discomfort, Guarding, Grimacing Pain Intervention(s): Limited activity within patient's tolerance, Monitored during session, Repositioned     Hand Dominance Right   Extremity/Trunk Assessment Upper Extremity Assessment Upper Extremity Assessment: RUE deficits/detail;LUE deficits/detail RUE Deficits / Details: Reports h/o R rotator cuff injury, flex/ABD AROM <90' able to tolerate <120' with PROM. Limited grip/hand strength, requiring use of soft call bell RUE: Shoulder pain with ROM RUE Coordination: decreased fine motor;decreased gross motor LUE Deficits / Details: Limited grip/hand strength, requiring use of soft call bell LUE Coordination: decreased gross motor;decreased fine motor   Lower Extremity Assessment Lower Extremity Assessment: Defer to PT evaluation RLE Deficits / Details: Functionally <3/5 throughout. LLE wrapped in gauze, still with copious wound drainage with bed mobility RLE Coordination: decreased gross motor;decreased fine motor LLE Deficits / Details: Functionally <3/5 throughout LLE Coordination: decreased gross motor;decreased fine motor       Communication Communication Communication: Tracheostomy   Cognition Arousal/Alertness: Awake/alert Behavior During Therapy: Flat affect Overall Cognitive Status: Within Functional Limits for tasks assessed                                 General Comments: WFL for simple tasks, some repetition needed for following simple commands. Flat affect, but starting to smile and sing along with music during session     General Comments  LLE and sacral wounds wrapped with wound vac, still significant drainage around groin/sacral sites    Exercises     Shoulder Instructions      Home Living Family/patient expects to be discharged to:: Inpatient rehab Living Arrangements: Children;Other relatives Available Help at Discharge: Family;Available 24  hours/day Type of Home: House Home Access: Ramped entrance     Home Layout: One level     Bathroom Shower/Tub: Teacher, early years/pre: Standard Bathroom Accessibility: Yes How Accessible: Accessible via walker;Accessible via wheelchair        Lives With: Family    Prior Functioning/Environment Prior Level of Function : Independent/Modified Independent             Mobility Comments: Has been working with CIR therapies on bed mobility and sitting EOB, requiring assist +1-2 ADLs Comments: independent        OT Problem List: Decreased strength;Decreased range of motion;Decreased activity tolerance;Impaired balance (sitting and/or standing);Pain      OT Treatment/Interventions: Self-care/ADL training;Therapeutic exercise;DME and/or AE instruction;Therapeutic activities;Patient/family education;Balance training    OT Goals(Current goals can be found in the care plan section) Acute Rehab OT Goals Patient Stated Goal: to be able to sit at EOB OT Goal Formulation: With patient Time For Goal Achievement: 08/13/21 Potential to Achieve Goals: Good  OT Frequency: Min 2X/week    Co-evaluation PT/OT/SLP Co-Evaluation/Treatment: Yes Reason for Co-Treatment: Complexity of the patient's impairments (multi-system involvement)   OT goals addressed during session: ADL's and self-care      AM-PAC OT "6 Clicks" Daily Activity     Outcome Measure Help from another person eating meals?: A Little Help from another person taking care of personal  grooming?: A Lot Help from another person toileting, which includes using toliet, bedpan, or urinal?: Total Help from another person bathing (including washing, rinsing, drying)?: A Lot Help from another person to put on and taking off regular upper body clothing?: A Lot Help from another person to put on and taking off regular lower body clothing?: Total 6 Click Score: 11   End of Session    Activity Tolerance: Patient  tolerated treatment well Patient left: in bed;with call bell/phone within reach (placed with touch pad)  OT Visit Diagnosis: Unsteadiness on feet (R26.81);Other abnormalities of gait and mobility (R26.89);Repeated falls (R29.6);Pain Pain - Right/Left: Left Pain - part of body: Leg                Time: 9604-5409 OT Time Calculation (min): 24 min Charges:  OT General Charges $OT Visit: 1 Visit OT Evaluation $OT Eval Moderate Complexity: [place] OTR/L  Acute Rehab Services  [phone] office number [phone] pager number   Joeseph Amor 07/30/2021, 11:18 AM

## 2021-07-30 NOTE — Progress Notes (Signed)
Inpatient Diabetes Program Recommendations ? ?AACE/ADA: New Consensus Statement on Inpatient Glycemic Control (2015) ? ?Target Ranges:  Prepandial:   less than 140 mg/dL ?     Peak postprandial:   less than 180 mg/dL (1-2 hours) ?     Critically ill patients:  140 - 180 mg/dL  ? ?Lab Results  ?Component Value Date  ? GLUCAP 127 (H) 07/30/2021  ? HGBA1C 5.9 (H) 07/29/2021  ? ? ?Review of Glycemic Control ? Latest Reference Range & Units 07/30/21 00:36 07/30/21 05:05 07/30/21 08:12 07/30/21 11:36  ?Glucose-Capillary 70 - 99 mg/dL 163 (H) 178 (H) 160 (H) 127 (H)  ?(H): Data is abnormally high ? ?Diabetes history: DM2 ?Outpatient Diabetes medications: Lantus 28 units BID, Humalog sliding scale ?Current orders for Inpatient glycemic control: Semglee 15 units QHS, Novolog 0-9 units Q4H, Prednisone 10 units QD ? ?Received inpatient DM referral.  Agree with current regimen.  CBG's within goal. ? ?Will continue to follow while inpatient. ? ?Thank you, ?Reche Dixon, MSN, RN ?Diabetes Coordinator ?Inpatient Diabetes Program ?[phone] (team pager from 8a-5p) ? ? ? ? ? ?

## 2021-07-30 NOTE — Consult Note (Signed)
WOC Nurse Consult Note: ?Consult requested for "wounds". Plastics is involved in care and has completed debridement of left leg wound with skin graft. Debridement of sacral pressure ulcer with skin graft. Dr. Erin Hearing with plastics is the attending for this patient and is taking care of his wounds. WOC will sign off at this time but are available to this patient and medical team if needed.  ? ?Thank you for the consult. [place] nurse will not follow at this time.   ?Please re-consult the [place] team if needed. ? ?Cathlean [place]se[place]. Tamala Julian, MSN, RN, CMSRN, AGCNS, WTA ?Wound Treatment Associate ?Pager [phone]   ? ? ?  ?

## 2021-07-30 NOTE — Consult Note (Signed)
NAME:  Tony Young, MRN:  [id number], DOB:  07/17/1955, LOS: 1 ADMISSION DATE:  07/29/2021, CONSULTATION DATE: 3/2 REFERRING MD: Dr. Pietro Cassis, CHIEF COMPLAINT: Chronic tracheostomy  History of Present Illness:  66 year old male who was admitted 3/1 to [facility] inpatient rehab for ongoing lower extremity wound care.  Patient was previously admitted 2/15 to [facility] inpatient rehab after a prolonged hospital stay in [place] in the setting of necrotizing fasciitis of the left lower extremity with associated strep pyogenes bacteremia.  He had a prolonged hospital stay which was complicated by septic shock, multiple incision and drainage of the left lower extremity, prolonged respiratory failure, renal failure requiring CRRT, GI bleed from gastric ulcer requiring PRBC, NSTEMI, volume overload, encephalopathy, shock liver.  Due to prolonged critical illness he ultimately required tracheostomy to facilitate ventilator weaning and PEG placement (placed on 12/27).  He was transferred to [facility] in [place] for ventilator weaning.  That hospitalization was complicated by ventilator associated pneumonia, exacerbation of COPD, and atrial fibrillation with RVR.  While at [facility], he was weaned to [facility] 1/25.  He ultimately was discharged from [facility] to [facility] inpatient rehab.  He required ongoing diuresis for volume overload.  While at [facility] inpatient rehab he had significant functional deficits requiring greater than 3 hours a day of physical therapy.  While at inpatient rehab his course was complicated by bilateral lower extremity DVT requiring full dose anticoagulation.  The patient was transferred to [facility] on 3/1 for ongoing wound care needs of lower extremities.  He was seen by PCCM on 2/27 while on inpatient rehab.  PCCM consulted 3/2 for assistance with tracheostomy care.   Prior Timeline 2/15 admit to [facility] 2/16 lower extremity venous duplex positive for bilateral DVT, seen by cardiology  for A-fib 2/21 PCCM consulted for trach management 2/27 PCCM follow-up 3/1 Tx to [facility] for wound care   Pertinent  Medical History  OSA, COPD, CAD, CHF, colon cancer, DM 2, A-fib, OSA (questionable compliance with CPAP prior to current tracheostomy)  Significant Hospital Events: Including procedures, antibiotic start and stop dates in addition to other pertinent events   3/1 admit to [facility] from [facility] inpatient rehab for lower extremity wound care 3/2 PCCM consulted   Interim History / Subjective:  As above  On RA   Objective   Blood pressure 97/62, pulse 83, temperature 98.2 F (36.8 C), temperature source Oral, resp. rate 15, SpO2 100 %.    FiO2 (%):  [21 %] 21 %   Intake/Output Summary (Last 24 hours) at 07/30/2021 1508 Last data filed at 07/30/2021 1300 Gross per 24 hour  Intake 2364.43 ml  Output 850 ml  Net 1514.43 ml   There were no vitals filed for this visit.  Examination: General: chronically ill appearing adult male lying in bed watching TV HENT: MM pink/moist, cuffless trach midline c/d/i, PMV in place Lungs: non-labored, symmetrical expansion, no accessory muscle use Cardiovascular: SR on monitor Abdomen: soft, tolerating PO's  Extremities: warm, dry.  LLE wrapped / drains in place Neuro: AAOx4, MAE  Resolved Hospital Problem list     Assessment & Plan:   Chronic Respiratory Failure with Tracheostomy Dependence  OHS / OSA  Tobacco Abuse with COPD  Severe Physical Deconditioning  Obesity   Prior to admission which led to current illness, he was not using his CPAP due to increased weight gain and intolerance of the mask.  He had a prolonged critical illness and intimately was able to wean from mechanical  ventilation.  During prior discussions, he elected to keep the tracheostomy indefinitely rather than return to CPAP. He continues to require airway support in terms of return trips to the OR as well. For this reason, he should not be downsized for airway needs  while in the OR.  He is tolerating his diet, has good phonation and size will allow for better secretion clearance if he were to develop another pneumonia.    Plan: -continue routine trach care  -PMV valve during daytime hours as tolerated -no role for downsizing tracheostomy as risks outweigh benefits currently (not that he could not be in the distant future once over ongoing illness and is willing to wear positive pressure) -continue PT / rehab efforts  -PCCM will continue to follow weekly for trach care / support  -routine trach change Q3 months, plan for routine change next week (?if it has been changed in the OR with return trips)  Best Practice (right click and "Reselect all SmartList Selections" daily)  Per Primary Team   Labs   CBC: Recent Labs  Lab 07/24/21 0248 07/27/21 0722 07/28/21 0503 07/29/21 0618 07/29/21 2139 07/30/21 0243  WBC 4.2 2.9* 3.3* 3.1* 4.3 4.3  NEUTROABS 2.4 1.8  --  1.8  --  3.8  HGB 8.6* 8.8* 9.2* 8.3* 10.0* 9.0*  HCT 27.0* 28.7* 30.0* 26.3* 31.8* 29.1*  MCV 88.8 89.7 89.8 88.9 90.1 89.8  PLT 122* 136* 149* 139* 136* 135*    Basic Metabolic Panel: Recent Labs  Lab 07/24/21 0248 07/27/21 0722 07/29/21 0618 07/30/21 0243  NA 133* 135 136 137  K 4.6 3.1* 3.5 4.5  CL 100 99 100 102  CO2 21* 26 24 23   GLUCOSE 173* 95 111* 177*  BUN 18 17 22 18   CREATININE 1.47* 1.31* 1.28* 1.30*  CALCIUM 8.0* 8.2* 8.4* 8.1*  MG  --   --   --  2.1  PHOS  --   --   --  5.3*   GFR: Estimated Creatinine Clearance: 68.1 mL/min (A) (by C-G formula based on SCr of 1.3 mg/dL (H)). Recent Labs  Lab 07/28/21 0503 07/29/21 0618 07/29/21 2139 07/30/21 0243  WBC 3.3* 3.1* 4.3 4.3    Liver Function Tests: Recent Labs  Lab 07/30/21 0243  AST 12*  ALT 14  ALKPHOS 75  BILITOT 0.4  PROT 5.6*  ALBUMIN 2.5*   No results for input(s): LIPASE, AMYLASE in the last 168 hours. No results for input(s): AMMONIA in the last 168 hours.  ABG    Component Value  Date/Time   PHART 7.482 (H) 07/03/2021 0950   PCO2ART 33.6 07/03/2021 0950   PO2ART 56.3 (L) 07/03/2021 0950   HCO3 25.0 07/03/2021 0950   ACIDBASEDEF 1.3 06/25/2021 1252   O2SAT 91.2 07/03/2021 0950     Coagulation Profile: No results for input(s): INR, PROTIME in the last 168 hours.  Cardiac Enzymes: No results for input(s): CKTOTAL, CKMB, CKMBINDEX, TROPONINI in the last 168 hours.  HbA1C: Hgb A1c MFr Bld  Date/Time Value Ref Range Status  07/29/2021 09:39 PM 5.9 (H) 4.8 - 5.6 % Final    Comment:    (NOTE) Pre diabetes:          5.7%-6.4%  Diabetes:              >6.4%  Glycemic control for   <7.0% adults with diabetes   07/16/2021 06:12 AM 5.9 (H) 4.8 - 5.6 % Final    Comment:    (NOTE) Pre diabetes:  5.7%-6.4%  Diabetes:              >6.4%  Glycemic control for   <7.0% adults with diabetes     CBG: Recent Labs  Lab 07/29/21 2205 07/30/21 0036 07/30/21 0505 07/30/21 0812 07/30/21 1136  GLUCAP 193* 163* 178* 160* 127*    Review of Systems: Positives in [place]   Gen: Denies fever, chills, weight change, fatigue, night sweats HEENT: Denies blurred vision, double vision, hearing loss, tinnitus, sinus congestion, rhinorrhea, sore throat, neck stiffness, dysphagia PULM: Denies shortness of breath, cough, sputum production, hemoptysis, wheezing CV: Denies chest pain, edema, orthopnea, paroxysmal nocturnal dyspnea, palpitations GI: Denies abdominal pain, nausea, vomiting, diarrhea, hematochezia, melena, constipation, change in bowel habits GU: Denies dysuria, hematuria, polyuria, oliguria, urethral discharge Endocrine: Denies hot or cold intolerance, polyuria, polyphagia or appetite change Derm: Denies rash, dry skin, scaling or peeling skin change Heme: Denies easy bruising, bleeding, bleeding gums Neuro: Denies headache, numbness, weakness, slurred speech, loss of memory or consciousness MSK: LLE heel pain   Past Medical History:  He,  has a past  medical history of CAD (coronary artery disease), Cardiomyopathy ([place]), Colon cancer ([place]), COPD (chronic obstructive pulmonary disease) ([place]), OSA (obstructive sleep apnea), and Type 2 diabetes mellitus ([place]).   Surgical History:   Past Surgical History:  Procedure Laterality Date   APPLICATION OF WOUND VAC Left 07/29/2021   Procedure: APPLICATION OF WOUND VAC;  Surgeon: Lennice Sites, MD;  Location: [place];  Service: Plastics;  Laterality: Left;   COLON RESECTION     INCISION AND DRAINAGE OF WOUND Left 07/29/2021   Procedure: DEBRIDEMENT  OF LEFT LOWER LEG WOUND;  Surgeon: Lennice Sites, MD;  Location: [place];  Service: Plastics;  Laterality: Left;   IR REMOVAL TUN CV CATH W/O FL  07/01/2021   PEG PLACEMENT  05/26/2021   SKIN SPLIT GRAFT Left 07/29/2021   Procedure: SKIN GRAFT SPLIT THICKNESS LEFT LOWER LEG;  Surgeon: Lennice Sites, MD;  Location: [place];  Service: Plastics;  Laterality: Left;   TRACHEOSTOMY  05/26/2021   WOUND DEBRIDEMENT Left 07/29/2021   Procedure: DEBRIDEMENT LEFT BUTTOCK SACRAL WOUND;  Surgeon: Lennice Sites, MD;  Location: [place];  Service: Plastics;  Laterality: Left;     Social History:   reports that he has never smoked. He has never used smokeless tobacco. He reports that he does not drink alcohol and does not use drugs.   Family History:  His family history includes Cancer in his father; Diabetes in his maternal grandfather.   Allergies Allergies  Allergen Reactions   Statins Other (See Comments)    Made hands swell   Penicillins Nausea And Vomiting and Other (See Comments)    Other reaction(s): GI intolerance Other reaction(s): Nausea And Vomiting, Other (See Comments), Other (see comments) Other reaction(s): Other (See Comments) Other reaction(s): Nausea And Vomiting, Other (See Comments), Other (see comments) Other reaction(s): Nausea And Vomiting, Other (See Comments), Other (see comments)    Ropinirole Other (See Comments)     Home Medications   Prior to Admission medications   Medication Sig Start Date End Date Taking? Authorizing Provider  amantadine (SYMMETREL) 100 MG capsule Take 50 mg by mouth daily.    [provider]  amiodarone (PACERONE) 200 MG tablet Take 200 mg by mouth daily.    [provider]  b complex-vitamin c-folic acid (NEPHRO-VITE) 0.8 MG TABS tablet Take 1 tablet by mouth daily.    [provider]  busPIRone (BUSPAR) 5 MG  tablet Take 7.5 mg by mouth 2 (two) times daily.    [provider]  Carboxymethylcellulose Sod PF 0.5 % SOLN Place 1 drop into both eyes in the morning, at noon, and at bedtime.    [provider]  Cholecalciferol (VITAMIN D3) 25 MCG (1000 UT) CAPS Take 1,000 Units by mouth daily.    [provider]  ferrous sulfate 300 (60 Fe) MG/5ML syrup Take 300 mg by mouth daily.    [provider]  fiber (NUTRISOURCE FIBER) PACK packet Take 1 packet by mouth in the morning, at noon, and at bedtime.    [provider]  insulin glargine (LANTUS) 100 UNIT/ML injection Inject 28 Units into the skin 2 (two) times daily.    [provider]  insulin lispro (HUMALOG) 100 UNIT/ML injection Inject 1 Units into the skin every 6 (six) hours. Per sliding scale    [provider]  ipratropium-albuterol (DUONEB) 0.5-2.5 (3) MG/3ML SOLN Take 3 mLs by nebulization in the morning, at noon, and at bedtime.    [provider]  latanoprost (XALATAN) 0.005 % ophthalmic solution Place 1 drop into both eyes at bedtime.    [provider]  levothyroxine (SYNTHROID) 75 MCG tablet Take 75 mcg by mouth daily before breakfast.    [provider]  loratadine (CLARITIN) 10 MG tablet Take 10 mg by mouth daily.    [provider]  Melatonin 3 MG CAPS Take 3 mg by mouth at bedtime.    [provider]  metoprolol tartrate (LOPRESSOR) 50 MG tablet Take 50 mg by mouth 2 (two) times daily.    [provider]  Metoprolol Tartrate (METOPROLOL TARTARATE 1 MG/ML SYRINGE, 5ML,) Inject 2.5 mg into the vein every 6 (six) hours as needed (high blood pressure).    [provider]  modafinil (PROVIGIL) 100 MG tablet Take 100 mg by mouth daily.    [provider]  nutrition supplement, JUVEN, (JUVEN) PACK Take 1 packet by mouth in the morning, at noon, and at bedtime.    [provider]  Omega-3 Fatty Acids (FISH OIL) 1000 MG CAPS Take 4,000 mg by mouth daily.    [provider]  pantoprazole sodium (PROTONIX) 40 mg/20 mL SUSP Take 40 mg by mouth daily.    [provider]  potassium chloride (KLOR-CON) 20 MEQ packet Take 40 mEq by mouth every 4 (four) hours as needed (low potassium).    [provider]  predniSONE (DELTASONE) 20 MG tablet Take 20 mg by mouth daily with breakfast.    [provider]  pregabalin (LYRICA) 50 MG capsule Take 50 mg by mouth daily.    [provider]  primidone (MYSOLINE) 50 MG tablet Take 50 mg by mouth daily.    [provider]  risperiDONE (RISPERDAL) 1 MG tablet Take 1 mg by mouth 2 (two) times daily.    [provider]  sertraline (ZOLOFT) 50 MG tablet Take 100 mg by mouth at bedtime.    [provider]  vitamin C (ASCORBIC ACID) 500 MG tablet Take 500 mg by mouth daily.    [provider]         Noe Gens, MSN, APRN, NP-C, AGACNP-BC [facility] 07/30/2021, 3:08 PM   Please see [email or web address] for pager details.   From 7A-7P if no response, please call [phone] After hours, please call ELink [phone]

## 2021-07-30 NOTE — Evaluation (Signed)
Physical Therapy Evaluation ?Patient Details ?Name: Tony Young ?MRN: [id number] ?DOB: [birth year] ?Today's Date: 07/30/2021 ? ?History of Present Illness ? Pt is a 66 y.o. male originally admitted to [facility] 05/13/21 with d/c to [facility] 06/10/21, readmission then transfer to CIR on 2/15; pt now admitted 07/29/21 for LLE and sacral wound debridement and skin graft. PMH includes CHF, COPD, CAD, DM, trach, peg. ?  ?Clinical Impression ? Pt presents with an overall decrease in functional mobility secondary to above. PTA, pt admitted at [facility] requiring assist +1-2 for bed mobility and sitting balance tasks. Today, pt performing bed-level mobility with assist +1-2; deferred seated or OOB tasks secondary to significant drainage from wounds with bed mobility and repositioning. Pt pleasant and motivated to participate; pt reports, "My goal is to work on getting out of bed." Pt would benefit from continued post-acute rehab services to maximize functional mobility and independence. Will follow acutely to address established goals.  ?   ?HR up to 120s (afib)  ?SpO2 96% on RA ?Post-mobility BP 105/63   ? ?Recommendations for follow up therapy are one component of a multi-disciplinary discharge planning process, led by the attending physician.  Recommendations may be updated based on patient status, additional functional criteria and insurance authorization. ? ?Follow Up Recommendations Acute inpatient rehab (3hours/day) ? ?  ?Assistance Recommended at Discharge Intermittent Supervision/Assistance  ?Patient can return home with the following ? Two people to help with walking and/or transfers;Two people to help with bathing/dressing/bathroom;Assistance with cooking/housework;Assist for transportation;Help with stairs or ramp for entrance ? ?  ?Equipment Recommendations Wheelchair (measurements PT);Wheelchair cushion (measurements PT);Hospital bed (lift equipment)  ?Recommendations for Other Services ?    ?  ?Functional Status  Assessment Patient has had a recent decline in their functional status and demonstrates the ability to make significant improvements in function in a reasonable and predictable amount of time.  ? ?  ?Precautions / Restrictions Precautions ?Precautions: Fall;Other (comment) ?Precaution Comments: trach, PEG, unable to read, fragile skin; LE/sacral wound drainage ?Restrictions ?Weight Bearing Restrictions: No  ? ?  ? ?Mobility ? Bed Mobility ?Overal bed mobility: Needs Assistance ?Bed Mobility: Rolling ?  ?  ?  ?  ?  ?General bed mobility comments: MinA to roll towards R side with use of bed rail, pt requesting towel around bed rail to allow hooking L elbow to maintain hold due to limited grip strength. Mod-maxA for full hip rotation rolling towards L side ?  ? ?Transfers ?  ?  ?  ?  ?  ?  ?  ?  ?  ?General transfer comment: deferred secondary to significant wound drainage during bed mobility ?  ? ?Ambulation/Gait ?  ?  ?  ?  ?  ?  ?  ?  ? ?Stairs ?  ?  ?  ?  ?  ? ?Wheelchair Mobility ?  ? ?Modified Rankin (Stroke Patients Only) ?  ? ?  ? ?Balance   ?  ?  ?  ?  ?  ?  ?  ?  ?  ?  ?  ?  ?  ?  ?  ?  ?  ?  ?   ? ? ? ?Pertinent Vitals/Pain Pain Assessment ?Pain Assessment: Faces ?Faces Pain Scale: Hurts little more ?Pain Location: LLE ?Pain Descriptors / Indicators: Discomfort, Guarding, Grimacing ?Pain Intervention(s): Monitored during session, Limited activity within patient's tolerance, Repositioned  ? ? ?Home Living Family/patient expects to be discharged to:: Inpatient rehab ?  ?  ?  ?  ?  ?  ?  ?  ?  ?   ?  ?  Prior Function   ?  ?  ?  ?  ?  ?  ?Mobility Comments: Has been working with CIR therapies on bed mobility and sitting EOB, requiring assist +1-2 ?  ?  ? ? ?Hand Dominance  ?   ? ?  ?Extremity/Trunk Assessment  ? Upper Extremity Assessment ?Upper Extremity Assessment: RUE deficits/detail;LUE deficits/detail ?RUE Deficits / Details: Reports h/o R rotator cuff injury, flex/ABD AROM <90' able to tolerate <120' with  PROM. Limited grip/hand strength, requiring use of soft call bell ?RUE: Shoulder pain with ROM ?RUE Coordination: decreased fine motor;decreased gross motor ?LUE Deficits / Details: Limited grip/hand strength, requiring use of soft call bell ?LUE Coordination: decreased gross motor;decreased fine motor ?  ? ?Lower Extremity Assessment ?Lower Extremity Assessment: RLE deficits/detail;LLE deficits/detail ?RLE Deficits / Details: Functionally <3/5 throughout. LLE wrapped in gauze, still with copious wound drainage with bed mobility ?RLE Coordination: decreased gross motor;decreased fine motor ?LLE Deficits / Details: Functionally <3/5 throughout ?LLE Coordination: decreased gross motor;decreased fine motor ?  ? ?   ?Communication  ? Communication: Tracheostomy  ?Cognition Arousal/Alertness: Awake/alert ?Behavior During Therapy: Flat affect ?Overall Cognitive Status: Within Functional Limits for tasks assessed ?  ?  ?  ?  ?  ?  ?  ?  ?  ?  ?  ?  ?  ?  ?  ?  ?General Comments: WFL for simple tasks, some repetition needed for following simple commands. Flat affect, but starting to smile and sing along with music during session ?  ?  ? ?  ?General Comments General comments (skin integrity, edema, etc.): LLE and sacral wounds wrapped with wound vac, still significant drainage around groin/sacral sites ? ?  ?Exercises    ? ?Assessment/Plan  ?  ?PT Assessment Patient needs continued PT services  ?PT Problem List Decreased strength;Decreased range of motion;Decreased activity tolerance;Decreased balance;Decreased mobility;Decreased knowledge of use of DME;Cardiopulmonary status limiting activity;Impaired sensation;Decreased skin integrity;Pain ? ?   ?  ?PT Treatment Interventions DME instruction;Functional mobility training;Therapeutic activities;Therapeutic exercise;Balance training;Gait training;Patient/family education;Wheelchair mobility training   ? ?PT Goals (Current goals can be found in the Care Plan section)  ?Acute  Rehab PT Goals ?Patient Stated Goal: "I want to work on getting out of this bed" ?PT Goal Formulation: With patient ?Time For Goal Achievement: 08/13/21 ?Potential to Achieve Goals: Fair ? ?  ?Frequency Min 3X/week ?  ? ? ?Co-evaluation PT/OT/SLP Co-Evaluation/Treatment: Yes ?Reason for Co-Treatment: Complexity of the patient's impairments (multi-system involvement);For patient/therapist safety;To address functional/ADL transfers ?  ?  ?  ? ? ?  ?AM-PAC PT "6 Clicks" Mobility  ?Outcome Measure Help needed turning from your back to your side while in a flat bed without using bedrails?: A Lot ?Help needed moving from lying on your back to sitting on the side of a flat bed without using bedrails?: A Lot ?Help needed moving to and from a bed to a chair (including a wheelchair)?: Total ?Help needed standing up from a chair using your arms (e.g., wheelchair or bedside chair)?: Total ?Help needed to walk in hospital room?: Total ?Help needed climbing 3-5 steps with a railing? : Total ?6 Click Score: 8 ? ?  ?End of Session   ?Activity Tolerance: Patient tolerated treatment well;Treatment limited secondary to medical complications (Comment) ?Patient left: in bed;with call bell/phone within reach;Other (comment) (soft call bell) ?Nurse Communication: Mobility status ?PT Visit Diagnosis: Other abnormalities of gait and mobility (R26.89);Muscle weakness (generalized) (M62.81) ?  ? ?Time: 1856-3149 ?PT Time Calculation (  min) (ACUTE ONLY): 24 min ? ? ?Charges:   PT Evaluation ?$PT Eval Moderate Complexity: 1 Mod ?  ?  ?   ?Mabeline Caras, PT, DPT ?Acute Rehabilitation Services  ?Pager [phone] ?Office [phone] ? ?[name] ?07/30/2021, 8:49 AM ? ?

## 2021-07-30 NOTE — Progress Notes (Signed)
PROGRESS NOTE  Tony Young  DOB: June 18, 1955  PCP: Tony Ferries, MD TOI:[id number]  DOA: 07/29/2021  LOS: 1 day  Hospital Day: 2  Brief narrative: Tony Young is a 66 y.o. male with PMH significant for obesity, OSA, DM2, CAD, cardiomyopathy, COPD, colon cancer.  On 05/13/2021, patient was admitted at [facility] ED for CHF exacerbation.  During the course of hospitalization, patient developed septic shock due to necrotizing fasciitis of left lower extremity with a strep pyogenes bacteremia.  He was intubated for respiratory failure, required pressors, subsequently developed acute renal failure he required multiple I&D of left lower extremity.  He required CRRT and was transitioned to dialysis when stable.  He developed DVT and was started on heparin drip but that had to be stopped because of GI bleed.  EGD on 12/24 showed gastric ulcer and required multiple units PRBCs.  On 12/27, he underwent trach/PEG.  He was discharged to [facility] on 06/10/2021 for further management. While at [facility], he had pneumonia, COPD exacerbation, bouts of A-fib.  His respiratory status improved and he was liberated off the ventilator to oxygen by trach collar on 1/25.  He had issues with overload but was not able to aggressively diurese because of recurrent AKI.   He was admitted to CIR on 2/15 because of functional decline.  While at [facility], he was seen by plastic surgeon and recommended I&D of the left leg. 3/1, plastic surgery did debridement of left leg wound with skin grafting, debridement of sacral pressure ulcer with skin grafting.   Admitted to hospitalist service.  Subjective: Patient was seen and examined this morning. Morbidly obese elderly male.  On trach collar.  Try to open eyes on touch.  No family at bedside  Principal Problem:   Open leg wound Active Problems:   Tracheostomy status (HCC)   Chronic combined systolic and diastolic CHF (congestive heart failure) (HCC)    Controlled type 2 diabetes mellitus with hyperglycemia, with long-term current use of insulin (HCC)   OSA (obstructive sleep apnea)   Dvt femoral (deep venous thrombosis) (HCC)   Necrotizing fasciitis of lower leg (HCC)   Paroxysmal atrial fibrillation with RVR (HCC)   CKD stage 2 due to type 2 diabetes mellitus (HCC)   Anemia due to chronic kidney disease    Assessment and Plan: Open left leg wound History of necrotizing fasciitis requiring multiple I&D's -Followed by plastic surgery.   -3/1, underwent left leg wound debridement and skin grafting -Wound VAC in place per plastics. -Currently not on antibiotics.  Defer to plastic surgery  Left buttock/sacral pressure ulcer -3/1, underwent sacral pressure ulcer debridement and skin grafting.  Tracheostomy dependent respiratory failure OSA COPD -Tracheostomy done on 05/26/21.  Currently on tracheostomy collar. -PCCM and respiratory therapist involved for tracheostomy management. -Per rehab note from 3/1, continue button plugging during the day and ATC at night for humidification -I noticed that, PTA, patient was on prednisone 10 mg daily.  Currently continued on the same.  Chronic combined systolic and diastolic CHF  -PTA, patient was on metoprolol, not on diuresis because of frequent AKI -Continue metoprolol.  May benefit from diuresis.  Continue to monitor hemodynamics for next 24 hours.  If stable, we may initiate diuresis.  Type 2 diabetes mellitus -A1c 5.9 on 07/29/2021 -Currently on Lantus 15 units daily, sliding scale insulin with Accu-Cheks -Blood sugar level remains controlled less than 200 today.  Continue to monitor and adjust. Recent Labs  Lab 07/29/21 2205 07/30/21 0036 07/30/21  0505 07/30/21 0812 07/30/21 1136  GLUCAP 193* 163* 178* 160* 127*    DVT -2/17, patient had extensive DVTs in right lower extremity and some left lower extremity and was started on Lovenox.  Currently on heparin drip.   -Plan DOAC prior  to discharge  Paroxysmal atrial fibrillation with RVR  -Continue amiodarone 200 mg daily, metoprolol 50 mg twice daily  CKD 2 -Creatinine remains at baseline.  Slightly trending up.  Continue to monitor -Avoid nephrotoxic medications Recent Labs    07/07/21 0303 07/09/21 0346 07/12/21 0310 07/14/21 0345 07/16/21 0612 07/20/21 0555 07/24/21 0248 07/27/21 0722 07/29/21 0618 07/30/21 0243  BUN 76* 62* 49* 44* 40* 19 18 17 22 18   CREATININE 1.33* 1.25* 1.20 1.22 1.20 1.16 1.47* 1.31* 1.28* 1.30*   Anemia due to chronic kidney disease Recent history of GI bleed -Baseline hemoglobin close to 9.  Per chart review, patient had GI bleeding while in the hospital in [place].  EGD on 05/23/21 showed gastric ulcer and required multiple units PRBCs. -Continue to monitor -Continue iron supplement, PPI twice daily. Recent Labs    06/10/21 2342 06/12/21 0139 07/27/21 0722 07/28/21 0503 07/29/21 0618 07/29/21 2139 07/30/21 0243  HGB 8.6*   < > 8.8* 9.2* 8.3* 10.0* 9.0*  MCV 95.3   < > 89.7 89.8 88.9 90.1 89.8  VITAMINB12 1,402*  --   --   --   --   --  1,580*  FOLATE 15.9  --   --   --   --   --  36.0  FERRITIN  --   --   --   --   --   --  101  TIBC 211*  --   --   --   --   --  245*  IRON 27*  --   --   --   --   --  29*  RETICCTPCT  --   --   --   --   --   --  3.6*   < > = values in this interval not displayed.   Functional deficits  -secondary to critical illness myopathy, necrotizing fasciitis, prolonged hospitalization, respiratory failure -Was in rehab since 2/15 to 2/28  Anxiety/depression -PTA patient was on buspirone 7.5 mg twice daily, modafinil 100 mg daily, Lyrica 50 mg daily, Zoloft 100 mg at bedtime, Risperdal 1 mg twice daily, primidone 50 mg daily. -Continue to titrate medications.  Goals of care   Code Status: Full Code    Mobility: PT following  Nutritional status:  There is no height or weight on file to calculate BMI.  Nutrition Problem: Increased  nutrient needs Etiology: wound healing Signs/Symptoms: estimated needs     Diet:  Diet Order             DIET DYS 3 Room service appropriate? Yes; Fluid consistency: Thin  Diet effective now                   DVT prophylaxis:  SCD's Start: 07/29/21 2017   Antimicrobials: None currently Fluid: NS at 50 mill per hour Consultants: Plastic surgery, palliative care Family Communication: None at bedside  Status is: Inpatient  Continue in-hospital care because: POD1 Level of care: Med-Surg   Dispo: The patient is from: CIR              Anticipated d/c is to: Pending clinical course              Patient currently is  not medically stable to d/c.   Difficult to place patient No     Infusions:   sodium chloride 50 mL/hr at 07/30/21 0002   sodium chloride     heparin 1,500 Units/hr (07/30/21 1220)   lactated ringers 10 mL/hr at 07/29/21 1509    Scheduled Meds:  acetaminophen  1,000 mg Per Tube Q6H   amantadine  50 mg Per Tube Daily   amiodarone  200 mg Per Tube Daily   vitamin C  500 mg Per Tube Daily   busPIRone  7.5 mg Per Tube BID   chlorhexidine  15 mL Mouth Rinse BID   cholecalciferol  1,000 Units Per Tube Daily   dextrose       docusate  100 mg Per Tube BID   feeding supplement  237 mL Oral TID BM   ferrous sulfate  300 mg Per Tube Daily   insulin aspart  0-9 Units Subcutaneous Q4H   insulin glargine-yfgn  15 Units Subcutaneous QHS   latanoprost  1 drop Both Eyes QHS   levothyroxine  75 mcg Per Tube QAC breakfast   loratadine  10 mg Per Tube Daily   mouth rinse  15 mL Mouth Rinse q12n4p   melatonin  3 mg Per Tube QHS   metoprolol tartrate  12.5 mg Per Tube BID   multivitamin with minerals  1 tablet Per Tube QHS   pantoprazole sodium  40 mg Per Tube BID   polyvinyl alcohol  1 drop Both Eyes QID   predniSONE  10 mg Per Tube Q breakfast   pregabalin  50 mg Per Tube Daily   risperiDONE  1 mg Per Tube BID   sertraline  100 mg Per Tube QHS   sodium  chloride flush  3 mL Intravenous Q12H   zinc sulfate  220 mg Oral Daily    PRN meds: sodium chloride, acetaminophen **OR** acetaminophen, albuterol, fentaNYL (SUBLIMAZE) injection, HYDROmorphone (DILAUDID) injection, ondansetron **OR** ondansetron (ZOFRAN) IV, oxyCODONE, sodium chloride flush   Antimicrobials: Anti-infectives (From admission, onward)    None       Objective: Vitals:   07/30/21 1209 07/30/21 1342  BP: 109/64 97/62  Pulse: (!) 106 83  Resp: 16 15  Temp:  98.2 F (36.8 C)  SpO2: 96% 100%    Intake/Output Summary (Last 24 hours) at 07/30/2021 1614 Last data filed at 07/30/2021 1300 Gross per 24 hour  Intake 2364.43 ml  Output 850 ml  Net 1514.43 ml   There were no vitals filed for this visit. Weight change:  There is no height or weight on file to calculate BMI.   Physical Exam: General exam: Elderly Caucasian male, obese, trach/PEG Skin: No rashes, lesions or ulcers. HEENT: Atraumatic, normocephalic, no obvious bleeding Lungs: Diminished air entry in both bases CVS: Regular rate and rhythm, no murmur GI/Abd soft, distended from obesity, nontender.  PEG tube in place CNS: Tries to open eyes on touch.  Per nursing, he was more awake later Psychiatry: Sad affect Extremities: Anasarca  Data Review: I have personally reviewed the laboratory data and studies available.  F/u labs ordered Unresulted Labs (From admission, onward)     Start     Ordered   07/31/21 0500  Vitamin C  Tomorrow morning,   R       Question:  Specimen collection method  Answer:  Lab=Lab collect   07/30/21 1323   07/31/21 0500  Zinc  Tomorrow morning,   R       Question:  Specimen collection method  Answer:  Lab=Lab collect   07/30/21 1323   07/31/21 0500  C-reactive protein  Tomorrow morning,   R       Question:  Specimen collection method  Answer:  Lab=Lab collect   07/30/21 1326   07/30/21 0500  CBC  Daily,   R      07/29/21 2017   07/30/21 1484  Basic metabolic panel   Daily,   R      07/29/21 2017   07/30/21 0500  Heparin level (unfractionated)  Daily,   R      07/29/21 1853   07/30/21 0500  CBC  Daily,   R      07/29/21 1853            Signed, Terrilee Croak, MD [facility] 07/30/2021

## 2021-07-30 NOTE — Progress Notes (Signed)
ANTICOAGULATION CONSULT NOTE - Follow Up Consult ? ?Pharmacy Consult for heparin ?Indication: DVT ? ?Labs: ?Recent Labs  ?  07/27/21 ?0722 07/27/21 ?0722 07/28/21 ?0503 07/28/21 ?1147 07/29/21 ?0618 07/29/21 ?2139 07/30/21 ?0243  ?HGB 8.8*  --  9.2*  --  8.3* 10.0* 9.0*  ?HCT 28.7*  --  30.0*  --  26.3* 31.8* 29.1*  ?PLT 136*  --  149*  --  139* 136* 135*  ?APTT  --   --  79* 85* 90*  --   --   ?HEPARINUNFRC  --    < > >1.10* >1.10* 0.99*  --  0.54  ?CREATININE 1.31*  --   --   --  1.28*  --   --   ? < > = values in this interval not displayed.  ? ? ?Assessment/Plan:  ?66yo male therapeutic on heparin after resuming post-op. Will continue infusion at current rate of 1500 units/hr and monitor daily level.  ? ?Wynona Neat, PharmD, BCPS  ?07/30/2021,3:46 AM ? ? ?

## 2021-07-30 NOTE — Progress Notes (Signed)
? ?  Inpatient Rehab Admissions Coordinator : ? ?Per therapy recommendations patient was screened for CIR readmission by Danne Baxter RN MSN. Patient is not yet at a level to tolerate the intensity required to pursue a CIR readmit. Clemens Catholic, Admissions Coordinator, will follow his progress to assess. ? ?Danne Baxter RN MSN ?Admissions Coordinator ?[phone]  ?

## 2021-07-30 NOTE — Progress Notes (Signed)
?   07/29/21 2120  ?Assess: MEWS Score  ?Temp (!) 97.5 ?F (36.4 ?C)  ?BP (!) 108/57  ?Pulse Rate 99  ?ECG Heart Rate (!) 115  ?Resp 14  ?Level of Consciousness Alert  ?SpO2 100 %  ?O2 Device Room Air  ?FiO2 (%) 21 %  ?Assess: MEWS Score  ?MEWS Temp 0  ?MEWS Systolic 0  ?MEWS Pulse 2  ?MEWS RR 0  ?MEWS LOC 0  ?MEWS Score 2  ?MEWS Score Color Yellow  ?Assess: if the MEWS score is Yellow or Red  ?Were vital signs taken at a resting state? Yes  ?Focused Assessment No change from prior assessment ?(first assessment, no change per report received)  ?Early Detection of Sepsis Score *See Row Information* Low  ?MEWS guidelines implemented *See Row Information* Yes  ?Treat  ?MEWS Interventions Escalated (See documentation below)  ?Pain Scale 0-10  ?Pain Score 7  ?Pain Type Surgical pain  ?Pain Location Leg  ?Pain Orientation Left  ?Pain Descriptors / Indicators Aching;Discomfort  ?Pain Frequency Intermittent  ?Pain Onset On-going  ?Patients Stated Pain Goal 6  ?Pain Intervention(s) Repositioned  ?Multiple Pain Sites Yes  ?2nd Pain Site  ?Pain Score 7  ?Pain Type Surgical pain;Chronic pain  ?Pain Location Sacrum  ?Pain Orientation Mid  ?Pain Descriptors / Indicators Aching  ?Pain Frequency Intermittent  ?Pain Onset On-going  ?Patient's Stated Pain Goal 6  ?Pain Intervention(s) Repositioned  ?Take Vital Signs  ?Increase Vital Sign Frequency  Yellow: Q 2hr X 2 then Q 4hr X 2, if remains yellow, continue Q 4hrs  ?Escalate  ?MEWS: Escalate Yellow: discuss with charge nurse/RN and consider discussing with provider and RRT  ?Notify: Charge Nurse/RN  ?Name of Charge Nurse/RN Notified Blanch Media, RN  ?Date Charge Nurse/RN Notified 07/29/21  ?Notify: Provider  ?Provider Name/Title Dr. Clarene Essex  ?Date Provider Notified 07/29/21  ?Time Provider Notified 2311  ?Notification Type  ?(secure chat)  ?Notification Reason Other (Comment) ?(was yellow mews on arrival due to HR 110s (chronic afib) HR better now 98-100s)  ?Provider response No new  orders  ?Date of Provider Response 07/29/21  ?Document  ?Patient Outcome Stabilized after interventions ?(HR improved after pt repositioned and pain improved)  ? ? ?

## 2021-07-30 NOTE — Progress Notes (Signed)
Pt admitted from PACU, arrived via bed. Wound vac to LLE and ace wrap over lower leg and part of left upper leg. Part of dressing to left thigh visible and is clear dressing with blood pooling under it. Per PACU RN, that is how dressing has been since arrival to PACU. Pedal pulse palpable.  ?Later, appears that drainage has increased under dressing but difficult to clearly mark due to location of dressing and fluid moves with pt movement; contacted rapid RN to come to bedside and assess; rapid RN helped to mark drainage.  ?MD notified of continued increase in drainage under dressing later in night and that pt on heparin drip; per MD wait for lab results and continue to monitor. Labs stable in morning. No more significant changes noted in drainage or dressing the rest of the shift.  ? ?On admission, pt also reports that he does not take any meds orally and all meds go through his PEG. All meds ordered via oral route on admit so contacted night MD who confirmed ok to use PEG for meds. Then contacted pharmacist who updated meds orders for via PEG. Pt does states that he is able to eat and drink orally but declined anything over night.  ?

## 2021-07-31 DIAGNOSIS — Z930 Tracheostomy status: Secondary | ICD-10-CM

## 2021-07-31 DIAGNOSIS — S81802A Unspecified open wound, left lower leg, initial encounter: Secondary | ICD-10-CM | POA: Diagnosis not present

## 2021-07-31 LAB — BASIC METABOLIC PANEL
Anion gap: 9 (ref 5–15)
BUN: 17 mg/dL (ref 8–23)
CO2: 23 mmol/L (ref 22–32)
Calcium: 7.8 mg/dL — ABNORMAL LOW (ref 8.9–10.3)
Chloride: 100 mmol/L (ref 98–111)
Creatinine, Ser: 1.15 mg/dL (ref 0.61–1.24)
GFR, Estimated: >60 mL/min (ref >60)
Glucose, Bld: 132 mg/dL — ABNORMAL HIGH (ref 70–99)
Potassium: 3.3 mmol/L — ABNORMAL LOW (ref 3.5–5.1)
Sodium: 132 mmol/L — ABNORMAL LOW (ref 135–145)

## 2021-07-31 LAB — CBC
HCT: 29.4 % — ABNORMAL LOW (ref 39.0–52.0)
Hemoglobin: 9.1 g/dL — ABNORMAL LOW (ref 13.0–17.0)
MCH: 28.1 pg (ref 26.0–34.0)
MCHC: 31 g/dL (ref 30.0–36.0)
MCV: 90.7 fL (ref 80.0–100.0)
Platelets: 144 10*3/uL — ABNORMAL LOW (ref 150–400)
RBC: 3.24 MIL/uL — ABNORMAL LOW (ref 4.22–5.81)
RDW: 17.1 % — ABNORMAL HIGH (ref 11.5–15.5)
WBC: 5.3 10*3/uL (ref 4.0–10.5)
nRBC: 0.8 % — ABNORMAL HIGH (ref 0.0–0.2)

## 2021-07-31 LAB — SURGICAL PATHOLOGY

## 2021-07-31 LAB — GLUCOSE, CAPILLARY
Glucose-Capillary: 108 mg/dL — ABNORMAL HIGH (ref 70–99)
Glucose-Capillary: 84 mg/dL (ref 70–99)
Glucose-Capillary: 103 mg/dL — ABNORMAL HIGH (ref 70–99)
Glucose-Capillary: 132 mg/dL — ABNORMAL HIGH (ref 70–99)
Glucose-Capillary: 166 mg/dL — ABNORMAL HIGH (ref 70–99)

## 2021-07-31 LAB — MAGNESIUM: Magnesium: 2.2 mg/dL (ref 1.7–2.4)

## 2021-07-31 LAB — C-REACTIVE PROTEIN: CRP: 10.5 mg/dL — ABNORMAL HIGH (ref <1.0)

## 2021-07-31 LAB — HEPARIN LEVEL (UNFRACTIONATED): Heparin Unfractionated: 0.65 IU/mL (ref 0.30–0.70)

## 2021-07-31 LAB — PHOSPHORUS: Phosphorus: 3.1 mg/dL (ref 2.5–4.6)

## 2021-07-31 MED ORDER — FUROSEMIDE 10 MG/ML IJ SOLN
40.0000 mg | Freq: Every day | INTRAMUSCULAR | Status: DC
Start: 2021-07-31 — End: 2021-08-10
  Administered 2021-07-31 – 2021-08-10 (×9): 40 mg via INTRAVENOUS
  Filled 2021-07-31 (×10): qty 4

## 2021-07-31 MED ORDER — POTASSIUM CHLORIDE 20 MEQ PO PACK
40.0000 meq | PACK | Freq: Once | ORAL | Status: AC
  Administered 2021-07-31: 40 meq via ORAL
  Filled 2021-07-31: qty 2

## 2021-07-31 NOTE — TOC Initial Note (Signed)
Transition of Care (TOC) - Initial/Assessment Note  ? ? ?Patient Details  ?Name: Tony Young ?MRN: [id number] ?Date of Birth: 02-Jan-1956 ? ?Transition of Care [facility]) CM/SW Contact:    ?Joanne Chars, LCSW ?Phone Number: ?07/31/2021, 3:34 PM ? ?Clinical Narrative:       Pt admitted from CIR, cannot return.  CSW spoke with pt, son Merry Proud, son in law [place].  Permission given to speak with them present.  Pt has trach placed 05/26/21.  Also has peg tube.  Discussed that pt cannot return to CIR, they are open to SNF but are from [facility] near [place] and would like placement back there, specifically asked about [facility] in [phone])  Choice document provided for [facility] area.          ? ? ?Expected Discharge Plan: Time ?Barriers to Discharge: Continued Medical Work up, Other (must enter comment), SNF Pending bed offer (trach patient) ? ? ?Patient Goals and CMS Choice ?Patient states their goals for this hospitalization and ongoing recovery are:: "get back on my harley" ?CMS Medicare.gov Compare Post Acute Care list provided to:: Patient Represenative (must comment) ?Choice offered to / list presented to : Adult Children (son Merry Proud) ? ?Expected Discharge Plan and Services ?Expected Discharge Plan: [place] ?In-house Referral: Clinical Social Work ?  ?Post Acute Care Choice: [place] ?Living arrangements for the past 2 months: [place] ?                ?  ?  ?  ?  ?  ?  ?  ?  ?  ?  ? ?Prior Living Arrangements/Services ?Living arrangements for the past 2 months: [place] ?Lives with:: Adult Children ?Patient language and need for interpreter reviewed:: Yes ?Do you feel safe going back to the place where you live?: Yes      ?Need for Family Participation in Patient Care: Yes (Comment) ?Care giver support system in place?: Yes (comment) ?Current home services: Other (comment)  (none) ?Criminal Activity/Legal Involvement Pertinent to Current Situation/Hospitalization: No - Comment as needed ? ?Activities of Daily Living ?  ?  ? ?Permission Sought/Granted ?Permission sought to share information with : Family Supports ?Permission granted to share information with : Yes, Verbal Permission Granted ? Share Information with NAME: son Merry Proud, daughter Lovell Sheehan, son in law Iona Beard ? Permission granted to share info w AGENCY: SNF ?   ?   ? ?Emotional Assessment ?Appearance:: Appears stated age ?Attitude/Demeanor/Rapport: Lethargic ?Affect (typically observed): Pleasant ?Orientation: : Oriented to Self, Oriented to Place, Oriented to  Time ?Alcohol / Substance Use: Not Applicable ?Psych Involvement: No (comment) ? ?Admission diagnosis:  Open leg wound [S81.809A] ?Necrotizing fasciitis ([place]) [M72.6] ?Patient Active Problem List  ? Diagnosis Date Noted  ? Open leg wound 07/29/2021  ? Paroxysmal atrial fibrillation with RVR ([place]) 07/29/2021  ? CKD stage 2 due to type 2 diabetes mellitus ([place]) 07/29/2021  ? Anemia due to chronic kidney disease 07/29/2021  ? Atrial flutter ([place]) 07/28/2021  ? OSA (obstructive sleep apnea) 07/28/2021  ? Dvt femoral (deep venous thrombosis) ([place]) 07/28/2021  ? Obesity hypoventilation syndrome ([place]) 07/28/2021  ? Necrotizing fasciitis of lower leg ([place]) 07/28/2021  ? Left knee pain 07/28/2021  ? Neutropenia ([place]) 07/28/2021  ? Chronic combined systolic and diastolic CHF (congestive heart failure) ([place])   ? Thrombocytopenia ([place])   ? Controlled type 2 diabetes mellitus with hyperglycemia, with long-term current use of insulin ([place])   ?  Critical illness myopathy 07/15/2021  ? Acute on chronic respiratory failure with hypoxia ([place]) 06/20/2021  ? Severe sepsis with septic shock ([place]) 06/20/2021  ? Tracheostomy status ([place]) 06/20/2021  ? Chronic heart failure with preserved ejection fraction ([place]) 06/20/2021  ? AF (paroxysmal atrial fibrillation) ([place]) 06/20/2021  ? Acute renal  failure due to tubular necrosis ([place]) 06/20/2021  ? ?PCP:  Rosaria Ferries, MD ?Pharmacy:  No Pharmacies Listed ? ? ? ?Social Determinants of Health (SDOH) Interventions ?  ? ?Readmission Risk Interventions ?No flowsheet data found. ? ? ?

## 2021-07-31 NOTE — Progress Notes (Signed)
07/31/2021 ? ?I have seen and evaluated the patient for trach management. ? ?S:  ?No events, leg is a bit sore. ?Denies breathing trouble. ? ?O: ?Blood pressure 120/82, pulse (!) 103, temperature 98.5 ?F (36.9 ?C), temperature source Oral, resp. rate 16, height '5\' 10"'  (1.778 m), weight 72.6 kg, SpO2 99 %.  ?Short neck, trach in place with PMV ?[place] 4 airway ?LLE wrapped with drain ?Moves all 4 ext to command ?Lungs clear ? ?A:  ?Myriad of issues leading to trach/PEG in late 2022.  Baseline OSA followed by optho? ? ?LLE nec fasc ? ?P:  ?Spoke to patient regarding using CPAP vs. Continuing trach to treat his OSA.  Tracheostomy is curative and he would prefer not to pursue decannulation. ? ?Continue routine trach care, we are available PRN ? ?Erskine Emery MD ?[facility] Pulmonary Critical Care ?Prefer epic messenger for cross cover needs ?If after hours, please call E-link ? ?

## 2021-07-31 NOTE — Progress Notes (Signed)
Inpatient Rehab Admissions Coordinator:  ? ? I spoke with rehab MD and social work team on CIR (where Pt. Was prior to surgery) and they stated that pt. Was not progressing or participating well  on CIR and is more appropriate for SNF. I will not pursue re-admission to CIR for this pt. I will notify TOC. ? ?Clemens Catholic, MS, CCC-SLP ?Rehab Admissions Coordinator  ?[phone] (celll) ?[phone] (office) ? ?

## 2021-07-31 NOTE — Progress Notes (Signed)
Pt was fed lunch ?

## 2021-07-31 NOTE — Progress Notes (Signed)
ANTICOAGULATION CONSULT NOTE - Follow Up Consult ? ?Pharmacy Consult for heparin ?Indication: DVT ? ?Labs: ?Recent Labs  ?  07/29/21 ?0618 07/29/21 ?2139 07/30/21 ?0243 07/31/21 ?0227  ?HGB 8.3* 10.0* 9.0* 9.1*  ?HCT 26.3* 31.8* 29.1* 29.4*  ?PLT 139* 136* 135* 144*  ?APTT 90*  --   --   --   ?HEPARINUNFRC 0.99*  --  0.54 0.65  ?CREATININE 1.28*  --  1.30* 1.15  ? ? ? ?Assessment/Plan:  ?66yo male with DVT per doppler 2/16(age indeterminate    DVT  left femoral vein,, and SF junction). Heparin resumed post-op 07/29/21.  Heparin level is currently  therapeutic on heparin rate 1500  units/hr.  Hgb  9s stable, PLTC low stable.   Will continue infusion at current rate of 1500 units/hr and monitor daily heparin level an CBC.  ? ? ?Thank you for allowing pharmacy to be part of this patients care team. ?Nicole Cella, RPh ?Clinical Pharmacist ?[phone] ?07/31/2021,2:20 PM ?Please check AMION for all [place] phone numbers ?After 10:00 PM, call [place] [phone] ? ? ?

## 2021-07-31 NOTE — Progress Notes (Addendum)
Patient POD1 s/p skin grafting to left keg wound and left sacral pressure ulcer debridement. ? ?Doing well ? ?PE ?VSS  AF ?Wound vac intact, donor site dressing intact ? ?A/P ?S/p skin grafting to site of previous necrotizing infection doing well POD 1.   ?1)  Will plan to take vac down over skin graft early this week ?2) may reinforce donor site with dry guaze, ACE wrap, etc, expect drainage ?3) left sacral dressing may be replaced, difficult to keep intact due to location close to anus ?4) from plastics perspective may transition to Lovenox or oral anticoagulants at any point. ? ?Pressure ulcer is Stage III and was present on this admission. ? ? ? ?

## 2021-07-31 NOTE — Care Management Important Message (Signed)
Important Message ? ?Patient Details  ?Name: Tony Young ?MRN: [id number] ?Date of Birth: 04/12/1956 ? ? ?Medicare Important Message Given:  Yes ? ? ? ? ?Levada Dy  Kathan Kirker-Martin ?07/31/2021, 2:21 PM ?

## 2021-07-31 NOTE — NC FL2 (Signed)
[place] LEVEL OF CARE SCREENING TOOL     IDENTIFICATION  Patient Name: Tony Young Birthdate: 1956-01-04 Sex: male Admission Date (Current Location): 07/29/2021  [facility] [place] Number:  Herbalist and Address:  [facility] [place], [address] [place], [place] [address]      Provider Number: [id number]  Attending Physician Name and Address:  Terrilee Croak, MD  Relative Name and Phone Number:  Tawni Carnes Daughter   [phone]    Current Level of Care: Hospital Recommended Level of Care: [place] Prior Approval Number:    Date Approved/Denied:   PASRR Number: [id number]  Discharge Plan: SNF    Current Diagnoses: Patient Active Problem List   Diagnosis Date Noted   Open leg wound 07/29/2021   Paroxysmal atrial fibrillation with RVR ([place]) 07/29/2021   CKD stage 2 due to type 2 diabetes mellitus ([place]) 07/29/2021   Anemia due to chronic kidney disease 07/29/2021   Atrial flutter ([place]) 07/28/2021   OSA (obstructive sleep apnea) 07/28/2021   Dvt femoral (deep venous thrombosis) ([place]) 07/28/2021   Obesity hypoventilation syndrome ([place]) 07/28/2021   Necrotizing fasciitis of lower leg ([place]) 07/28/2021   Left knee pain 07/28/2021   Neutropenia ([place]) 07/28/2021   Chronic combined systolic and diastolic CHF (congestive heart failure) (HCC)    Thrombocytopenia (HCC)    Controlled type 2 diabetes mellitus with hyperglycemia, with long-term current use of insulin (HCC)    Critical illness myopathy 07/15/2021   Acute on chronic respiratory failure with hypoxia (HCC) 06/20/2021   Severe sepsis with septic shock (HCC) 06/20/2021   Tracheostomy status ([place]) 06/20/2021   Chronic heart failure with preserved ejection fraction (HCC) 06/20/2021   AF (paroxysmal atrial fibrillation) ([place]) 06/20/2021   Acute renal failure due to tubular necrosis (HCC) 06/20/2021    Orientation RESPIRATION BLADDER Height &  Weight     Self, Time, Place  Normal Incontinent, External catheter Weight:   Height:     BEHAVIORAL SYMPTOMS/MOOD NEUROLOGICAL BOWEL NUTRITION STATUS      Incontinent Feeding tube (PEG)  AMBULATORY STATUS COMMUNICATION OF NEEDS Skin   Total Care Verbally Surgical wounds                       Personal Care Assistance Level of Assistance  Bathing, Feeding, Dressing, Total care Bathing Assistance: Maximum assistance Feeding assistance: Limited assistance Dressing Assistance: Maximum assistance Total Care Assistance: Maximum assistance   Functional Limitations Info  Sight, Hearing, Speech Sight Info: Adequate Hearing Info: Adequate Speech Info: Adequate    SPECIAL CARE FACTORS FREQUENCY  PT (By licensed PT), OT (By licensed OT)     PT Frequency: 5x week OT Frequency: 5x week            Contractures Contractures Info: Not present    Additional Factors Info  Code Status, Allergies, Insulin Sliding Scale Code Status Info: full Allergies Info: Statins, Penicillins, Ropinirole   Insulin Sliding Scale Info: Novolog, 0-9 units Q4       Current Medications (07/31/2021):  This is the current hospital active medication list Current Facility-Administered Medications  Medication Dose Route Frequency Provider Last Rate Last Admin   0.9 %  sodium chloride infusion  250 mL Intravenous PRN Doutova, Anastassia, MD       acetaminophen (TYLENOL) 160 MG/5ML solution 650 mg  650 mg Per Tube Q6H PRN Lennice Sites, MD       Or   acetaminophen (TYLENOL) suppository  650 mg  650 mg Rectal Q6H PRN Lennice Sites, MD       acetaminophen (TYLENOL) tablet 1,000 mg  1,000 mg Per Tube Q6H Lennice Sites, MD   1,000 mg at 07/31/21 0055   albuterol (PROVENTIL) (2.5 MG/3ML) 0.083% nebulizer solution 2.5 mg  2.5 mg Nebulization Q2H PRN Doutova, Nyoka Lint, MD       amantadine (SYMMETREL) 50 MG/5ML solution 50 mg  50 mg Per Tube Daily Lennice Sites, MD   50 mg at 07/31/21 8338   amiodarone  (PACERONE) tablet 200 mg  200 mg Per Tube Daily Lennice Sites, MD   200 mg at 07/31/21 2505   ascorbic acid (VITAMIN C) tablet 500 mg  500 mg Per Tube Daily Lennice Sites, MD   500 mg at 07/31/21 0931   busPIRone (BUSPAR) tablet 7.5 mg  7.5 mg Per Tube BID Lennice Sites, MD   7.5 mg at 07/31/21 0929   chlorhexidine (PERIDEX) 0.12 % solution 15 mL  15 mL Mouth Rinse BID Lennice Sites, MD   15 mL at 07/31/21 0941   cholecalciferol (VITAMIN D3) tablet 1,000 Units  1,000 Units Per Tube Daily Lennice Sites, MD   1,000 Units at 07/31/21 0929   docusate (COLACE) 50 MG/5ML liquid 100 mg  100 mg Per Tube BID Lennice Sites, MD   100 mg at 07/31/21 0934   feeding supplement (ENSURE ENLIVE / ENSURE PLUS) liquid 237 mL  237 mL Oral TID BM Dahal, Marlowe Aschoff, MD   237 mL at 07/31/21 1421   ferrous sulfate 300 (60 Fe) MG/5ML syrup 300 mg  300 mg Per Tube Daily Luppens, Quillian Quince, MD   300 mg at 07/31/21 0932   furosemide (LASIX) injection 40 mg  40 mg Intravenous Daily Dahal, Binaya, MD   40 mg at 07/31/21 0935   heparin ADULT infusion 100 units/mL (25000 units/236mL)  1,500 Units/hr Intravenous Continuous Doutova, Anastassia, MD 15 mL/hr at 07/31/21 0523 1,500 Units/hr at 07/31/21 0523   HYDROmorphone (DILAUDID) injection 0.5 mg  0.5 mg Intravenous Q2H PRN Lennice Sites, MD   0.5 mg at 07/31/21 1454   insulin aspart (novoLOG) injection 0-9 Units  0-9 Units Subcutaneous Q4H Toy Baker, MD   2 Units at 07/31/21 0055   insulin glargine-yfgn (SEMGLEE) injection 15 Units  15 Units Subcutaneous QHS Toy Baker, MD   15 Units at 07/30/21 2158   lactated ringers infusion   Intravenous Continuous Doutova, Anastassia, MD 10 mL/hr at 07/31/21 1457 New Bag at 07/31/21 1457   latanoprost (XALATAN) 0.005 % ophthalmic solution 1 drop  1 drop Both Eyes QHS Doutova, Anastassia, MD   1 drop at 07/30/21 2200   levothyroxine (SYNTHROID) tablet 75 mcg  75 mcg Per Tube QAC breakfast Lennice Sites, MD   75 mcg at  07/31/21 0531   loratadine (CLARITIN) tablet 10 mg  10 mg Per Tube Daily Lennice Sites, MD   10 mg at 07/31/21 0930   MEDLINE mouth rinse  15 mL Mouth Rinse q12n4p Luppens, Daniel, MD   15 mL at 07/31/21 1143   melatonin tablet 3 mg  3 mg Per Tube QHS Lennice Sites, MD   3 mg at 07/30/21 2157   metoprolol tartrate (LOPRESSOR) tablet 12.5 mg  12.5 mg Per Tube BID Lennice Sites, MD   12.5 mg at 07/31/21 0931   multivitamin with minerals tablet 1 tablet  1 tablet Per Tube QHS Lennice Sites, MD   1 tablet at 07/30/21 2158   ondansetron (ZOFRAN) tablet 4 mg  4 mg Per Tube Q6H PRN Lennice Sites, MD       Or   ondansetron [facility]) injection 4 mg  4 mg Intravenous Q6H PRN Lennice Sites, MD       oxyCODONE (Oxy IR/ROXICODONE) immediate release tablet 5-10 mg  5-10 mg Per Tube Q4H PRN Lennice Sites, MD   10 mg at 07/31/21 0928   pantoprazole sodium (PROTONIX) 40 mg/20 mL oral suspension 40 mg  40 mg Per Tube BID Terrilee Croak, MD   40 mg at 07/31/21 0944   polyvinyl alcohol (LIQUIFILM TEARS) 1.4 % ophthalmic solution 1 drop  1 drop Both Eyes QID Doutova, Anastassia, MD   1 drop at 07/31/21 1419   predniSONE (DELTASONE) tablet 10 mg  10 mg Per Tube Q breakfast Lennice Sites, MD   10 mg at 07/31/21 0355   pregabalin (LYRICA) capsule 50 mg  50 mg Per Tube Daily Lennice Sites, MD   50 mg at 07/31/21 0930   risperiDONE (RISPERDAL) tablet 1 mg  1 mg Per Tube BID Lennice Sites, MD   1 mg at 07/31/21 0932   sertraline (ZOLOFT) tablet 100 mg  100 mg Per Tube QHS Lennice Sites, MD   100 mg at 07/30/21 2157   sodium chloride flush (NS) 0.9 % injection 3 mL  3 mL Intravenous Q12H Doutova, Anastassia, MD   3 mL at 07/31/21 0943   sodium chloride flush (NS) 0.9 % injection 3 mL  3 mL Intravenous PRN Toy Baker, MD       zinc sulfate capsule 220 mg  220 mg Oral Daily Lennice Sites, MD   220 mg at 07/31/21 9741     Discharge Medications: Please see discharge summary for a list of  discharge medications.  Relevant Imaging Results:  Relevant Lab Results:   Additional Information SSN: [id number].  Pt is vaccinated for covid with 2 boosters.  Pt currently on trach collar placed 05/26/21.  Joanne Chars, LCSW
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
## 2021-08-01 DIAGNOSIS — S81802A Unspecified open wound, left lower leg, initial encounter: Secondary | ICD-10-CM | POA: Diagnosis not present

## 2021-08-01 DIAGNOSIS — I82419 Acute embolism and thrombosis of unspecified femoral vein: Secondary | ICD-10-CM | POA: Diagnosis not present

## 2021-08-01 DIAGNOSIS — E1165 Type 2 diabetes mellitus with hyperglycemia: Secondary | ICD-10-CM | POA: Diagnosis not present

## 2021-08-01 DIAGNOSIS — N1831 Chronic kidney disease, stage 3a: Secondary | ICD-10-CM | POA: Diagnosis not present

## 2021-08-01 LAB — GLUCOSE, CAPILLARY
Glucose-Capillary: 103 mg/dL — ABNORMAL HIGH (ref 70–99)
Glucose-Capillary: 115 mg/dL — ABNORMAL HIGH (ref 70–99)
Glucose-Capillary: 157 mg/dL — ABNORMAL HIGH (ref 70–99)
Glucose-Capillary: 161 mg/dL — ABNORMAL HIGH (ref 70–99)
Glucose-Capillary: 181 mg/dL — ABNORMAL HIGH (ref 70–99)
Glucose-Capillary: 88 mg/dL (ref 70–99)

## 2021-08-01 LAB — CBC
HCT: 31.8 % — ABNORMAL LOW (ref 39.0–52.0)
Hemoglobin: 9.9 g/dL — ABNORMAL LOW (ref 13.0–17.0)
MCH: 28.4 pg (ref 26.0–34.0)
MCHC: 31.1 g/dL (ref 30.0–36.0)
MCV: 91.1 fL (ref 80.0–100.0)
Platelets: 148 10*3/uL — ABNORMAL LOW (ref 150–400)
RBC: 3.49 MIL/uL — ABNORMAL LOW (ref 4.22–5.81)
RDW: 17.1 % — ABNORMAL HIGH (ref 11.5–15.5)
WBC: 6.1 10*3/uL (ref 4.0–10.5)
nRBC: 1 % — ABNORMAL HIGH (ref 0.0–0.2)

## 2021-08-01 LAB — HEPARIN LEVEL (UNFRACTIONATED): Heparin Unfractionated: 0.53 IU/mL (ref 0.30–0.70)

## 2021-08-01 NOTE — Progress Notes (Signed)
PROGRESS NOTE    Tony Young  DDU:[id number] DOB: 08/05/1955 DOA: 07/29/2021 PCP: Rosaria Ferries, MD   Brief Narrative:  Tony Young is a 66 y.o. male with PMH significant for obesity, OSA, DM2, CAD, cardiomyopathy, COPD, colon cancer.   On 05/13/2021, patient was admitted at [facility] in [place], [place] for CHF exacerbation.  During the course of hospitalization, patient developed septic shock due to necrotizing fasciitis of left lower extremity with a strep pyogenes bacteremia.  He was intubated for respiratory failure, required pressors, subsequently developed acute renal failure he required multiple I&D of left lower extremity.  He required CRRT and was transitioned to dialysis when stable.  He developed DVT and was started on heparin drip but that had to be stopped because of GI bleed.  EGD on 12/24 showed gastric ulcer and required multiple units PRBCs.  On 12/27, he underwent trach/PEG.  [facility] was [facility] on 06[facility]10[facility]2021 for further management. While at [facility], he had pneumonia, COPD exacerbation, bouts of A-fib.  His respiratory status improved and he was liberated off the ventilator to oxygen by trach collar on 1/25.  He had issues with overload but was not able to aggressively diurese because of recurrent AKI.   He was admitted to CIR on 2/15 because of functional decline.  Per CIR note, patient was not able to participate with them as expected.  Throughout the course of his illness since January, he was bedridden and sustained sacral ulcer.   While at [facility], he was seen by plastic surgeon and recommended I&D of the left leg and sacral wound. 3/1, plastic surgery did debridement of left leg wound with skin grafting, debridement of sacral pressure ulcer with skin grafting.   Admitted to hospitalist service.   Assessment & Plan:   Principal Problem:   Open leg wound Active Problems:   Tracheostomy status (HCC)   Chronic combined systolic and  diastolic CHF (congestive heart failure) (HCC)   Controlled type 2 diabetes mellitus with hyperglycemia, with long-term current use of insulin (HCC)   OSA (obstructive sleep apnea)   Dvt femoral (deep venous thrombosis) (HCC)   Necrotizing fasciitis of lower leg (HCC)   Paroxysmal atrial fibrillation with RVR (HCC)   CKD stage 2 due to type 2 diabetes mellitus (HCC)   Anemia due to chronic kidney disease   Open left leg wound History of necrotizing fasciitis requiring multiple I&D's -Followed by plastic surgery.   -3/1, underwent left leg wound debridement and skin grafting -Wound VAC in place per plastics. -Currently not on antibiotics.  Discussed with plastic surgery Dr. Erin Hearing.   Left buttock/sacral pressure ulcer -3/1, underwent sacral pressure ulcer debridement and skin grafting.   Tracheostomy dependent respiratory failure OSA COPD -Tracheostomy done on 05/26/21.  Currently on tracheostomy collar. -PCCM and respiratory therapist involved for tracheostomy management. -Per rehab note from 3/1, continue button plugging during the day and ATC at night for humidification -I noticed that, patient was receiving prednisone 10 mg daily at CIR. Presumably for COPD.  Currently continued on the same.   Chronic combined systolic and diastolic CHF  -PTA, patient was on metoprolol, not on diuresis per CIR because of frequent AKI -Patient had echocardiogram 2/22 and stress test 2/24 while at CIR.  EF 40 to 45%, normal RV size and function.  Negative Myoview for ischemia. -Continue metoprolol.   -Blood pressure improving, able to make urine.   -Tried Lasix IV 40 mg daily.3/3, pt bp sift and unable to  tolerate -Continue to monitor.   Type 2 diabetes mellitus -A1c 5.9 on 07/29/2021 -Currently on Lantus 15 units daily, sliding scale insulin with Accu-Cheks -Continue to monitor and adjust.   DVT -2/17, patient had extensive DVTs in right lower extremity and some left lower extremity and was  started on Lovenox.  Currently on heparin drip.   -Plan DOAC prior to discharge   Paroxysmal atrial fibrillation with RVR  -Continue amiodarone 200 mg daily, metoprolol 50 mg twice daily -intermittent break thru, but tx limited by hypotension, monitor closely and consider cards opinion if no improvement   CKD 2 -Creatinine remains at baseline.  Slightly trending up.  Continue to monitor -Avoid nephrotoxic medications   Anemia due to chronic kidney disease Recent history of GI bleed -Baseline hemoglobin close to 9,  -hgb 9.9 3/4.   -Per chart review, patient had GI bleeding while in the hospital in [place].  EGD on 05/23/21 showed gastric ulcer and required multiple units PRBCs. -Continue to monitor. -Continue iron supplement, PPI twice daily.   Functional deficits  -secondary to critical illness myopathy, necrotizing fasciitis, prolonged hospitalization, respiratory failure -Was in rehab since 2/15 to 2/28   Anxiety/depression -While at CIR, patient was on buspirone 7.5 mg twice daily, modafinil 100 mg daily, Lyrica 50 mg daily, Zoloft 100 mg at bedtime, Risperdal 1 mg twice daily, primidone 50 mg daily.  At this time it is not clear if patient was taking any of these medications prior to his hospitalization in January.  I believe some of them were added during the course of the last 2 months in the hospital, LTAC and rehab. -I called and spoke with his son this afternoon.  He is going to have his sister bring patient's previous list of medications.  DVT prophylaxis: SCD/Compression stockings  Code Status: full    Code Status Orders  (From admission, onward)           Start     Ordered   07/29/21 2137  Full code  Continuous        07/29/21 2137           Code Status History     Date Active Date Inactive Code Status Order ID Comments User Context   07/29/2021 2017 07/29/2021 2137 Full Code [id number]  Lennice Sites, MD Inpatient   07/15/2021 1638 07/29/2021 1458 Full Code  [id number]  Flora Lipps Inpatient   06/10/2021 2253 07/15/2021 1627 Full Code [id number]  Genella Rife Inpatient      Family Communication: patient today  Disposition Plan:   pending improvement, pt on wound vac, and not medically stable  due to hr, bp and postop status Consults called: None Admission status: Inpatient   Consultants:  Plastic surgery, palliative care  Procedures:  DG Knee 1-2 Views Left  Result Date: 07/21/2021 CLINICAL DATA:  Left knee pain after attempting to walk in rehab. History of fall. EXAM: LEFT KNEE - 1-2 VIEW COMPARISON:  None. FINDINGS: Small knee joint effusion. No fracture or dislocation. No evidence of lipohemarthrosis. 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 No evidence of chondrocalcinosis. Scattered adjacent vascular calcifications. No radiopaque foreign body. IMPRESSION: 1. Small knee joint effusion without associated fracture or lipohemarthrosis. 2. 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 Electronically Signed   By: Sandi Mariscal M.D.   On: 07/21/2021 16:56   NM Myocar Multi W/Spect  W/Wall Motion / EF  Result Date: 07/22/2021   Findings are equivocal.   There is infiltration of the IV with extravasation of the myoview into the patients arm during the stress images.   The study in uninterpretable .   DG Chest Port 1 View  Result Date: 07/15/2021 CLINICAL DATA:  66 year old male with acute on chronic respiratory failure. EXAM: PORTABLE CHEST 1 VIEW COMPARISON:  Portable chest 07/06/2021 and earlier. FINDINGS: Portable AP semi upright view at 0630 hours. Satisfactory tracheostomy. Stable mild cardiomegaly. Other mediastinal contours are within normal limits. Improved lung volumes. Allowing for portable technique the lungs are clear. No pneumothorax or pleural  effusion. No acute osseous abnormality identified. Paucity of bowel gas in the upper abdomen. IMPRESSION: Satisfactory tracheostomy.  No acute cardiopulmonary abnormality. Electronically Signed   By: Genevie Ann M.D.   On: 07/15/2021 07:21   DG Chest Port 1 View  Result Date: 07/06/2021 CLINICAL DATA:  66 year old male with tracheostomy. Removed dialysis catheter. EXAM: PORTABLE CHEST 1 VIEW COMPARISON:  Portable chest 07/01/2021 and earlier. FINDINGS: Portable AP semi upright view at 0540 hours. Right chest dual lumen catheter removed. Tracheostomy remains in place, stable. Mildly lower lung volumes. Stable cardiac size and mediastinal contours. No pneumothorax, pleural effusion, consolidation, or pleural effusion. Mild perihilar atelectasis suspected and stable. No new pulmonary opacity. No acute osseous abnormality identified. Paucity of bowel gas in the visible upper abdomen. IMPRESSION: 1. Right chest dialysis catheter removed. 2. No acute cardiopulmonary abnormality.  Stable mild atelectasis. Electronically Signed   By: Genevie Ann M.D.   On: 07/06/2021 06:08   ECHOCARDIOGRAM COMPLETE  Result Date: 07/22/2021    ECHOCARDIOGRAM REPORT   Patient Name:   IZAC FAULKENBERRY [facility] Date of Exam: 07/22/2021 Medical Rec #:  [id number]              Height:       67.0 in Accession #:    [id number]             Weight:       248.0 lb Date of Birth:  07-Apr-1956              BSA:          2.216 m Patient Age:    24 years               BP:           116/60 mmHg Patient Gender: M                      HR:           112 bpm. Exam Location:  Inpatient Procedure: 2D Echo, Cardiac Doppler and Color Doppler Indications:    I50.21 CHF  History:        Patient has no prior history of Echocardiogram examinations.                 Cardiomyopathy, CAD, COPD; Risk Factors:Diabetes.  Sonographer:    Beryle Beams Referring Phys: 20 RHONDA G BARRETT  Sonographer Comments: Patient is morbidly obese. IMPRESSIONS  1. Left ventricular ejection  fraction, by estimation, is 40 to 45%. The left ventricle has mildly decreased function. Left ventricular endocardial border not optimally defined to evaluate regional wall motion. There is mild left ventricular hypertrophy.  Left ventricular diastolic parameters are indeterminate.  2. Right ventricular systolic function is normal. The right ventricular size is normal.  3. The mitral valve is normal in structure. No  evidence of mitral valve regurgitation. No evidence of mitral stenosis.  4. The aortic valve is calcified. There is mild calcification of the aortic valve. There is mild thickening of the aortic valve. Aortic valve regurgitation is not visualized. Aortic valve sclerosis is present, with no evidence of aortic valve stenosis.  5. The inferior vena cava is normal in size with greater than 50% respiratory variability, suggesting right atrial pressure of 3 mmHg. FINDINGS  Left Ventricle: Left ventricular ejection fraction, by estimation, is 40 to 45%. The left ventricle has mildly decreased function. Left ventricular endocardial border not optimally defined to evaluate regional wall motion. The left ventricular internal cavity size was normal in size. There is mild left ventricular hypertrophy. Left ventricular diastolic parameters are indeterminate. Right Ventricle: The right ventricular size is normal. No increase in right ventricular wall thickness. Right ventricular systolic function is normal. Left Atrium: Left atrial size was normal in size. Right Atrium: Right atrial size was normal in size. Pericardium: There is no evidence of pericardial effusion. Mitral Valve: The mitral valve is normal in structure. No evidence of mitral valve regurgitation. No evidence of mitral valve stenosis. Tricuspid Valve: The tricuspid valve is normal in structure. Tricuspid valve regurgitation is not demonstrated. No evidence of tricuspid stenosis. Aortic Valve: The aortic valve is calcified. There is mild calcification of  the aortic valve. There is mild thickening of the aortic valve. Aortic valve regurgitation is not visualized. Aortic valve sclerosis is present, with no evidence of aortic valve stenosis. Aortic valve mean gradient measures 3.0 mmHg. Aortic valve peak gradient measures 5.3 mmHg. Aortic valve area, by VTI measures 1.96 cm. Pulmonic Valve: The pulmonic valve was normal in structure. Pulmonic valve regurgitation is not visualized. No evidence of pulmonic stenosis. Aorta: The aortic root is normal in size and structure. Venous: The inferior vena cava is normal in size with greater than 50% respiratory variability, suggesting right atrial pressure of 3 mmHg. IAS/Shunts: No atrial level shunt detected by color flow Doppler.  LEFT VENTRICLE PLAX 2D LVIDd:         5.00 cm LVIDs:         4.30 cm LV PW:         1.40 cm LV IVS:        1.00 cm LVOT diam:     2.20 cm LV SV:         41 LV SV Index:   18 LVOT Area:     3.80 cm  LV Volumes (MOD) LV vol d, MOD A2C: 88.5 ml LV vol d, MOD A4C: 102.0 ml LV vol s, MOD A2C: 43.1 ml LV vol s, MOD A4C: 56.4 ml LV SV MOD A2C:     45.4 ml LV SV MOD A4C:     102.0 ml LV SV MOD BP:      45.9 ml RIGHT VENTRICLE             IVC RV S prime:     10.30 cm/s  IVC diam: 2.00 cm TAPSE (M-mode): 1.8 cm LEFT ATRIUM             Index        RIGHT ATRIUM           Index LA diam:        4.00 cm 1.81 cm/m   RA Area:     17.20 cm LA Vol (A2C):   48.0 ml 21.66 ml/m  RA Volume:   51.90 ml  23.42 ml/m LA  Vol (A4C):   62.9 ml 28.39 ml/m LA Biplane Vol: 55.9 ml 25.23 ml/m  AORTIC VALVE                    PULMONIC VALVE AV Area (Vmax):    2.61 cm     PV Vmax:       0.70 m/s AV Area (Vmean):   2.36 cm     PV Vmean:      47.000 cm/s AV Area (VTI):     1.96 cm     PV VTI:        0.131 m AV Vmax:           115.00 cm/s  PV Peak grad:  1.9 mmHg AV Vmean:          82.900 cm/s  PV Mean grad:  1.0 mmHg AV VTI:            0.208 m AV Peak Grad:      5.3 mmHg AV Mean Grad:      3.0 mmHg LVOT Vmax:         79.10  cm/s LVOT Vmean:        51.400 cm/s LVOT VTI:          0.107 m LVOT/AV VTI ratio: 0.52  AORTA Ao Root diam: 2.60 cm Ao Asc diam:  2.10 cm  SHUNTS Systemic VTI:  0.11 m Systemic Diam: 2.20 cm Candee Furbish MD Electronically signed by Candee Furbish MD Signature Date/Time: 07/22/2021/11:05:23 AM    Final    ECHOCARDIOGRAM STRESS TEST  Result Date: 07/24/2021     DOBUTAMINE STRESS ECHO REPORT    -------------------------------------------------------------------------------- Patient Name:   DEMON VOLANTE [facility] Date of Exam: 07/24/2021 Medical Rec #:  [id number]              Height:       67.0 in Accession #:    [id number]             Weight:       248.9 lb Date of Birth:  [birth year]              BSA:          2.219 m Patient Age:    82 years               BP:           103/63 mmHg Patient Gender: M                      HR:           109 bpm. Exam Location:  Inpatient Procedure: Dobutamine Stress Indications:    pre-operative evaluation  History:        Patient has prior history of Echocardiogram examinations, most                 recent 07/22/2021.  Sonographer:    Johny Chess RDCS Referring Phys: [id number] [place]  1. This is a negative stress echocardiogram for ischemia.  2. This is a low risk study. FINDINGS Exam Protocol: Definity contrast agent was given IV to delineate the left ventricular endocardial borders.  Patient Performance: The baseline heart rate was 93 bpm. The heart rate at peak stress was 131 bpm. The target heart rate was calculated to be 131 bpm. The percentage of maximum predicted heart rate achieved was 84.9 %. The baseline blood pressure was 103/63 mmHg. The blood pressure at peak stress  was 103/63 mmHg. The patient developed no symptoms during the stress exam.  EKG: Resting EKG showed atrial fibrillation. The patient developed no abnormal EKG findings during exercise.  2D Echo Findings: The baseline ejection fraction was 60%. The peak ejection fraction at stress was 80%.  Baseline regional wall motion abnormalities were not present. There were no stress-induced wall motion abnormalities. This is a negative stress echocardiogram for ischemia.  Candee Furbish MD Electronically signed on 07/24/2021 at 3:44:31 PM    Final    CT VENOGRAM ABD/PEL  Result Date: 07/16/2021 CLINICAL DATA:  Deep venous thrombosis (DVT) EXAM: CT VENOGRAM ABDOMEN AND PELVIS WITH CONTRAST TECHNIQUE: Multidetector CT imaging of the abdomen and pelvis was performed using the standard protocol following bolus administration of intravenous contrast. RADIATION DOSE REDUCTION: This exam was performed according to the departmental dose-optimization program which includes automated exposure control, adjustment of the mA and/or kV according to patient size and/or use of iterative reconstruction technique. CONTRAST:  169m OMNIPAQUE IOHEXOL 350 MG/ML SOLN COMPARISON:  Chest XR, 07/15/2021. FINDINGS: Suboptimal evaluation, secondary to poor contrast opacification such that nonocclusive thrombus could missed. Lower chest: Small volume RIGHT pleural effusion. Cardiomegaly with multivessel coronary atherosclerosis. Hepatobiliary: Mild nodularity of liver contour, suspicious for cirrhosis. No focal liver lesion is seen. No gallstones, gallbladder wall thickening, or biliary dilatation. Pancreas: No pancreatic ductal dilatation or surrounding inflammatory changes. Spleen: Normal in size without focal abnormality. Perihilar accessory spleen. Adrenals/Urinary Tract: Adrenal glands are unremarkable. Kidneys are normal, without renal calculi, focal lesion, or hydronephrosis. Mild distention of the urinary bladder. Anterior non dependent intraluminal gas, within urinary bladder, correlate for recent catheterization. Stomach/Bowel: Gastrostomy, well-positioned at the antral stomach. Stomach is otherwise within normal limits. Nonobstructed small bowel. Appendix is not definitively visualized. Nondilated colon. Postsurgical changes of  LEFT colectomy with intact-appearing anastomosis. No evidence of bowel wall thickening, distention, or inflammatory changes. Vascular/Lymphatic: Aortic atherosclerosis without aneurysmal dilation. No enlarged abdominal or pelvic lymph nodes. *Venous expansion and appearance of filling defect involving the RIGHT SFV, profunda vein and CFV. *No evidence of thrombus extension into the RIGHT EIV. *Suspect additional venous expansion and filling defect involving the LEFT CFV. See key image. *No evidence of filling defect within the IVC or visualized portions of the pelvic veins. Reproductive: Prostate is unremarkable. Other: Gynecomastia. Anterior abdominal wall contusions, likely injection sites. No abdominal wall hernia. No abdominopelvic ascites. Musculoskeletal: No acute osseous findings. IMPRESSION: Suboptimal evaluation, within these constraints; 1. RIGHT lower extremity DVT, involving the RIGHT femoral vein, profunda and CFV. 2. Suspected additional LEFT lower extremity DVT, involving the LEFT CFV. 3. No CTV evidence of thrombus extension into the pelvic veins. These results will be called to the ordering clinician or representative by the Radiologist Assistant, and communication documented in the PACS or [employer] JMichaelle Birks MD [facility] Electronically Signed   By: JMichaelle BirksM.D.   On: 07/16/2021 16:17   VAS [place]OWER EXTREMITY VENOUS (DVT)  Result Date: 07/16/2021  Lower Venous DVT Study [facility] Date of Exam:   07/16/2021 Medical Rec #: [id number]              Accession #:    [id number] of Birth: 509-Apr-1957              Patient Gender: M Patient Age:   89years [facility]      VAS [place]OWER EXTREMITY VENOUS (  DVT) Referring Phys: PAMELA LOVE --------------------------------------------------------------------------------  Indications: Immobility.  Limitations: Body  habitus, poor ultrasound/tissue interface and left calf bandages. Performing Technologist: Archie Patten RVS  Examination Guidelines: A complete evaluation includes B-mode imaging, spectral Doppler, color Doppler, and power Doppler as needed of all accessible portions of each vessel. Bilateral testing is considered an integral part of a complete examination. Limited examinations for reoccurring indications may be performed as noted. The reflux portion of the exam is performed with the patient in reverse Trendelenburg.  +---------+---------------+---------+-----------+----------+-------------------+  RIGHT     Compressibility Phasicity Spontaneity Properties Thrombus Aging       +---------+---------------+---------+-----------+----------+-------------------+  CFV       None            No        No                     Age Indeterminate    +---------+---------------+---------+-----------+----------+-------------------+  SFJ       None                                             Age Indeterminate    +---------+---------------+---------+-----------+----------+-------------------+  FV Prox   None                                             Age Indeterminate    +---------+---------------+---------+-----------+----------+-------------------+  FV Mid    None                                             Age Indeterminate    +---------+---------------+---------+-----------+----------+-------------------+  FV Distal None                                             Age Indeterminate    +---------+---------------+---------+-----------+----------+-------------------+  PFV       None                                             Age Indeterminate    +---------+---------------+---------+-----------+----------+-------------------+  POP       None            No        No                                          +---------+---------------+---------+-----------+----------+-------------------+  PTV       None                                                                   +---------+---------------+---------+-----------+----------+-------------------+  PERO  Not well visualized  +---------+---------------+---------+-----------+----------+-------------------+  EIV                                                        patent with color                                                                doppler              +---------+---------------+---------+-----------+----------+-------------------+   +---------+---------------+---------+-----------+----------+-------------------+  LEFT      Compressibility Phasicity Spontaneity Properties Thrombus Aging       +---------+---------------+---------+-----------+----------+-------------------+  CFV       None            Yes       Yes                    Age Indeterminate    +---------+---------------+---------+-----------+----------+-------------------+  SFJ       None                                             Age Indeterminate    +---------+---------------+---------+-----------+----------+-------------------+  FV Prox   Full                                                                  +---------+---------------+---------+-----------+----------+-------------------+  FV Mid    Full            Yes       Yes                                         +---------+---------------+---------+-----------+----------+-------------------+  FV Distal Full            Yes       Yes                                         +---------+---------------+---------+-----------+----------+-------------------+  PFV       Full                                                                  +---------+---------------+---------+-----------+----------+-------------------+  POP       Full            Yes       Yes                                         +---------+---------------+---------+-----------+----------+-------------------+  PTV                                                         not visualized due                                                               to bandages          +---------+---------------+---------+-----------+----------+-------------------+  PERO                                                       not visualized due                                                               to bandages          +---------+---------------+---------+-----------+----------+-------------------+  EIV                                                        patent with color                                                                doppler              +---------+---------------+---------+-----------+----------+-------------------+     Summary: RIGHT: - Findings consistent with age indeterminate deep vein thrombosis involving the right common femoral vein, SF junction, right femoral vein, right proximal profunda vein, right popliteal vein, and right posterior tibial veins. - No cystic structure found in the popliteal fossa.  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 - No cystic structure found in the popliteal fossa.  *See table(s) above for measurements and observations. Electronically signed by Jamelle Haring on 07/16/2021 at 2:29:30 PM.    Final     Antimicrobials:  None currently    Subjective: Pt reports he feels better than he did a few weeks ago , but is concerned he cant mount the energy to get betetr  Objective: Vitals:   08/01/21 0758 08/01/21 0832 08/01/21 1115 08/01/21 1219  BP: (!) 92/55   114/71  Pulse: (!) 129   (!) 105  Resp: 20   19  Temp: 98.8 F (37.1 C)   98.3 F (36.8 C)  TempSrc: Oral   Oral  SpO2: 95% 96% 96% 94%  Intake/Output Summary (Last 24 hours) at 08/01/2021 1405 Last data filed at 08/01/2021 0900 Gross per 24 hour  Intake 200 ml  Output 2600 ml  Net -2400 ml   There were no vitals filed for this visit.  Examination:  General exam:  Elderly Caucasian male, obese, trach/PEG Skin: No rashes, lesions or ulcers. HEENT: Atraumatic, normocephalic, no obvious bleeding Lungs: Diminished air entry in both bases CVS: Regular rate and rhythm, no murmur GI/Abd soft, distended from obesity, nontender.  PEG tube in place CNS: Alert, awake, oriented x3.  Able to have a meaningful conversation. Psychiatry: flat affect,  Extremities: Anasarca    Data Reviewed: I have personally reviewed following labs and imaging studies  CBC: Recent Labs  Lab 07/27/21 0722 07/28/21 0503 07/29/21 0618 07/29/21 2139 07/30/21 0243 07/31/21 0227 08/01/21 0218  WBC 2.9*   < > 3.1* 4.3 4.3 5.3 6.1  NEUTROABS 1.8  --  1.8  --  3.8  --   --   HGB 8.8*   < > 8.3* 10.0* 9.0* 9.1* 9.9*  HCT 28.7*   < > 26.3* 31.8* 29.1* 29.4* 31.8*  MCV 89.7   < > 88.9 90.1 89.8 90.7 91.1  PLT 136*   < > 139* 136* 135* 144* 148*   < > = values in this interval not displayed.   Basic Metabolic Panel: Recent Labs  Lab 07/27/21 0722 07/29/21 0618 07/30/21 0243 07/31/21 0227  NA 135 136 137 132*  K 3.1* 3.5 4.5 3.3*  CL 99 100 102 100  CO2 '26 24 23 23  '$ GLUCOSE 95 111* 177* 132*  BUN '17 22 18 17  '$ CREATININE 1.31* 1.28* 1.30* 1.15  CALCIUM 8.2* 8.4* 8.1* 7.8*  MG  --   --  2.1 2.2  PHOS  --   --  5.3* 3.1   GFR: Estimated Creatinine Clearance: 77 mL/min (by C-G formula based on SCr of 1.15 mg/dL). Liver Function Tests: Recent Labs  Lab 07/30/21 0243  AST 12*  ALT 14  ALKPHOS 75  BILITOT 0.4  PROT 5.6*  ALBUMIN 2.5*   No results for input(s): LIPASE, AMYLASE in the last 168 hours. No results for input(s): AMMONIA in the last 168 hours. Coagulation Profile: No results for input(s): INR, PROTIME in the last 168 hours. Cardiac Enzymes: No results for input(s): CKTOTAL, CKMB, CKMBINDEX, TROPONINI in the last 168 hours. BNP (last 3 results) No results for input(s): PROBNP in the last 8760 hours. HbA1C: Recent Labs    07/29/21 2139  HGBA1C  5.9*   CBG: Recent Labs  Lab 07/31/21 2042 08/01/21 0001 08/01/21 0407 08/01/21 0821 08/01/21 1159  GLUCAP 166* 115* 88 103* 161*   Lipid Profile: No results for input(s): CHOL, HDL, LDLCALC, TRIG, CHOLHDL, LDLDIRECT in the last 72 hours. Thyroid Function Tests: Recent Labs    07/30/21 0243  TSH 3.763   Anemia Panel: Recent Labs    07/30/21 0243  VITAMINB12 1,580*  FOLATE 36.0  FERRITIN 101  TIBC 245*  IRON 29*  RETICCTPCT 3.6*   Sepsis Labs: No results for input(s): PROCALCITON, LATICACIDVEN in the last 168 hours.  Recent Results (from the past 240 hour(s))  SARS Coronavirus 2 by RT PCR (hospital order, performed in [facility] hospital lab) Nasopharyngeal Nasopharyngeal Swab     Status: None   Collection Time: 07/29/21  8:36 AM   Specimen: Nasopharyngeal Swab  Result Value Ref Range Status   SARS Coronavirus 2 NEGATIVE NEGATIVE Final    Comment: (NOTE) SARS-CoV-2 target  nucleic acids are NOT DETECTED.  The SARS-CoV-2 RNA is generally detectable in upper and lower respiratory specimens during the acute phase of infection. The lowest concentration of SARS-CoV-2 viral copies this assay can detect is 250 copies / mL. A negative result does not preclude SARS-CoV-2 infection and should not be used as the sole basis for treatment or other patient management decisions.  A negative result may occur with improper specimen collection / handling, submission of specimen other than nasopharyngeal swab, presence of viral mutation(s) within the areas targeted by this assay, and inadequate number of viral copies (<250 copies / mL). A negative result must be combined with clinical observations, patient history, and epidemiological information.  Fact Sheet for Patients:   StrictlyIdeas.no  Fact Sheet for Healthcare Providers: BankingDealers.co.za  This test is not yet approved or  cleared by the [place] FDA and has been  authorized for detection and/or diagnosis of SARS-CoV-2 by FDA under an Emergency Use Authorization (EUA).  This EUA will remain in effect (meaning this test can be used) for the duration of the COVID-19 declaration under Section 564(b)(1) of the Act, 21 U.S.C. section 360bbb-3(b)(1), unless the authorization is terminated or revoked sooner.  Performed at [facility], [place] [address], [place], [place] [address]          Radiology Studies: No results found.      Scheduled Meds:  acetaminophen  1,000 mg Per Tube Q6H   amantadine  50 mg Per Tube Daily   amiodarone  200 mg Per Tube Daily   vitamin C  500 mg Per Tube Daily   busPIRone  7.5 mg Per Tube BID   chlorhexidine  15 mL Mouth Rinse BID   cholecalciferol  1,000 Units Per Tube Daily   docusate  100 mg Per Tube BID   feeding supplement  237 mL Oral TID BM   ferrous sulfate  300 mg Per Tube Daily   furosemide  40 mg Intravenous Daily   insulin aspart  0-9 Units Subcutaneous Q4H   insulin glargine-yfgn  15 Units Subcutaneous QHS   latanoprost  1 drop Both Eyes QHS   levothyroxine  75 mcg Per Tube QAC breakfast   loratadine  10 mg Per Tube Daily   mouth rinse  15 mL Mouth Rinse q12n4p   melatonin  3 mg Per Tube QHS   metoprolol tartrate  12.5 mg Per Tube BID   multivitamin with minerals  1 tablet Per Tube QHS   pantoprazole sodium  40 mg Per Tube BID   polyvinyl alcohol  1 drop Both Eyes QID   predniSONE  10 mg Per Tube Q breakfast   pregabalin  50 mg Per Tube Daily   risperiDONE  1 mg Per Tube BID   sertraline  100 mg Per Tube QHS   sodium chloride flush  3 mL Intravenous Q12H   zinc sulfate  220 mg Oral Daily   Continuous Infusions:  sodium chloride     heparin 1,500 Units/hr (07/31/21 2220)   lactated ringers 10 mL/hr at 07/31/21 1457     LOS: 3 days    Time spent: 35 min    Nicolette Bang, MD [facility]  If 7PM-7AM, please contact night-coverage  08/01/2021, 2:05 PM

## 2021-08-01 NOTE — Progress Notes (Signed)
ANTICOAGULATION CONSULT NOTE - Follow Up Consult ? ?Pharmacy Consult for heparin ?Indication: DVT ? ?Labs: ?Recent Labs  ?  07/30/21 ?0243 07/31/21 ?0227 08/01/21 ?0218  ?HGB 9.0* 9.1* 9.9*  ?HCT 29.1* 29.4* 31.8*  ?PLT 135* 144* 148*  ?HEPARINUNFRC 0.54 0.65 0.53  ?CREATININE 1.30* 1.15  --   ? ? ? ?Assessment/Plan:  ?66yo male with DVT per doppler 2/16(age indeterminate    DVT  left femoral vein,, and SF junction). Heparin resumed post-op 07/29/21.  Heparin level is currently  therapeutic on heparin rate 1500  units/hr.  Hgb  9s stable, PLTC low stable.   Will continue infusion at current rate of 1500 units/hr and monitor daily heparin level an CBC.  ? ?Onnie Boer, PharmD, BCIDP, AAHIVP, CPP ?Infectious Disease Pharmacist ?08/01/2021 12:41 PM ? ? ? ? ? ? ?

## 2021-08-02 DIAGNOSIS — I82419 Acute embolism and thrombosis of unspecified femoral vein: Secondary | ICD-10-CM | POA: Diagnosis not present

## 2021-08-02 DIAGNOSIS — E1165 Type 2 diabetes mellitus with hyperglycemia: Secondary | ICD-10-CM | POA: Diagnosis not present

## 2021-08-02 DIAGNOSIS — N1831 Chronic kidney disease, stage 3a: Secondary | ICD-10-CM | POA: Diagnosis not present

## 2021-08-02 DIAGNOSIS — S81802A Unspecified open wound, left lower leg, initial encounter: Secondary | ICD-10-CM | POA: Diagnosis not present

## 2021-08-02 LAB — GLUCOSE, CAPILLARY
Glucose-Capillary: 100 mg/dL — ABNORMAL HIGH (ref 70–99)
Glucose-Capillary: 102 mg/dL — ABNORMAL HIGH (ref 70–99)
Glucose-Capillary: 104 mg/dL — ABNORMAL HIGH (ref 70–99)
Glucose-Capillary: 120 mg/dL — ABNORMAL HIGH (ref 70–99)
Glucose-Capillary: 138 mg/dL — ABNORMAL HIGH (ref 70–99)
Glucose-Capillary: 161 mg/dL — ABNORMAL HIGH (ref 70–99)
Glucose-Capillary: 84 mg/dL (ref 70–99)
Glucose-Capillary: 85 mg/dL (ref 70–99)

## 2021-08-02 LAB — CBC
HCT: 29.4 % — ABNORMAL LOW (ref 39.0–52.0)
Hemoglobin: 9.3 g/dL — ABNORMAL LOW (ref 13.0–17.0)
MCH: 28.4 pg (ref 26.0–34.0)
MCHC: 31.6 g/dL (ref 30.0–36.0)
MCV: 89.6 fL (ref 80.0–100.0)
Platelets: 137 10*3/uL — ABNORMAL LOW (ref 150–400)
RBC: 3.28 MIL/uL — ABNORMAL LOW (ref 4.22–5.81)
RDW: 16.8 % — ABNORMAL HIGH (ref 11.5–15.5)
WBC: 5.8 10*3/uL (ref 4.0–10.5)
nRBC: 0.5 % — ABNORMAL HIGH (ref 0.0–0.2)

## 2021-08-02 LAB — TYPE AND SCREEN
ABO/RH(D): O POS
Antibody Screen: NEGATIVE
Unit division: 0
Unit division: 0

## 2021-08-02 LAB — BPAM RBC
Blood Product Expiration Date: 202304052359
Blood Product Expiration Date: 202304052359
ISSUE DATE / TIME: 202303011657
Unit Type and Rh: 5100
Unit Type and Rh: 5100

## 2021-08-02 LAB — BASIC METABOLIC PANEL
Anion gap: 8 (ref 5–15)
BUN: 14 mg/dL (ref 8–23)
CO2: 23 mmol/L (ref 22–32)
Calcium: 7.8 mg/dL — ABNORMAL LOW (ref 8.9–10.3)
Chloride: 105 mmol/L (ref 98–111)
Creatinine, Ser: 1 mg/dL (ref 0.61–1.24)
GFR, Estimated: >60 mL/min (ref >60)
Glucose, Bld: 95 mg/dL (ref 70–99)
Potassium: 3.5 mmol/L (ref 3.5–5.1)
Sodium: 136 mmol/L (ref 135–145)

## 2021-08-02 LAB — ZINC: Zinc: 80 ug/dL (ref 44–115)

## 2021-08-02 LAB — HEPARIN LEVEL (UNFRACTIONATED): Heparin Unfractionated: 0.38 IU/mL (ref 0.30–0.70)

## 2021-08-02 MED ORDER — APIXABAN 5 MG PO TABS
5.0000 mg | ORAL_TABLET | Freq: Two times a day (BID) | ORAL | Status: DC
Start: 2021-08-02 — End: 2021-08-11
  Administered 2021-08-02 – 2021-08-11 (×18): 5 mg via ORAL
  Filled 2021-08-02 (×18): qty 1

## 2021-08-02 NOTE — Progress Notes (Signed)
PROGRESS NOTE    Tony Young  EUM:[id number] DOB: 11-06-1955 DOA: 07/29/2021 PCP: Rosaria Ferries, MD   Brief Narrative:  Tony Young is a 66 y.o. male with PMH significant for obesity, OSA, DM2, CAD, cardiomyopathy, COPD, colon cancer.   On 05/13/2021, patient was admitted at [facility] in [place], [place] for CHF exacerbation.  During the course of hospitalization, patient developed septic shock due to necrotizing fasciitis of left lower extremity with a strep pyogenes bacteremia.  He was intubated for respiratory failure, required pressors, subsequently developed acute renal failure he required multiple I&D of left lower extremity.  He required CRRT and was transitioned to dialysis when stable.  He developed DVT and was started on heparin drip but that had to be stopped because of GI bleed.  EGD on 12/24 showed gastric ulcer and required multiple units PRBCs.  On 12/27, he underwent trach/PEG.  He was [facility] on 06/10/2021 for further management. While at [facility], he had pneumonia, COPD exacerbation, bouts of A-fib.  His respiratory status improved and he was liberated off the ventilator to oxygen by trach collar on 1/25.  He had issues with overload but was not able to aggressively diurese because of recurrent AKI.   He was admitted to CIR on 2/15 because of functional decline.  Per CIR note, patient was not able to participate with them as expected.  Throughout the course of his illness since January, he was bedridden and sustained sacral ulcer.   While at [facility], he was seen by plastic surgeon and recommended I&D of the left leg and sacral wound. 3/1, plastic surgery did debridement of left leg wound with skin grafting, debridement of sacral pressure ulcer with skin grafting.   Admitted to hospitalist service.   Assessment & Plan:   Principal Problem:   Open leg wound Active Problems:   Tracheostomy status (HCC)   Chronic combined systolic and  diastolic CHF (congestive heart failure) (HCC)   Controlled type 2 diabetes mellitus with hyperglycemia, with long-term current use of insulin (HCC)   OSA (obstructive sleep apnea)   Dvt femoral (deep venous thrombosis) (HCC)   Necrotizing fasciitis of lower leg (HCC)   Paroxysmal atrial fibrillation with RVR (HCC)   CKD stage 2 due to type 2 diabetes mellitus (HCC)   Anemia due to chronic kidney disease   Open left leg wound History of necrotizing fasciitis requiring multiple I&D's -Followed by plastic surgery.   -3/1, underwent left leg wound debridement and skin grafting -Wound VAC in place per plastics. -Currently not on antibiotics.  Discussed by prev attending with plastic surgery Dr. Erin Hearing.   Left buttock/sacral pressure ulcer -3/1, underwent sacral pressure ulcer debridement and skin grafting.   Tracheostomy dependent respiratory failure OSA COPD -Tracheostomy done on 05/26/21.  Currently on tracheostomy collar. -PCCM and respiratory therapist involved for tracheostomy management. -Per rehab note from 3/1, continue button plugging during the day and ATC at night for humidification -I noticed that, patient was receiving prednisone 10 mg daily at CIR. Presumably for COPD.  Currently continued on the same.   Chronic combined systolic and diastolic CHF  -PTA, patient was on metoprolol, not on diuresis per CIR because of frequent AKI -Patient had echocardiogram 2/22 and stress test 2/24 while at CIR.  EF 40 to 45%, normal RV size and function.  Negative Myoview for ischemia. -Continue metoprolol.   -Blood pressure improving, able to make urine.   -Tried Lasix IV 40 mg daily.3/3, pt bp sift  and unable to tolerate, consider another trial 3/6 if maintains bp for 24 hrs -Continue to monitor.   Type 2 diabetes mellitus -A1c 5.9 on 07/29/2021 -Currently on Lantus 15 units daily, sliding scale insulin with Accu-Cheks -Continue to monitor and adjust.   DVT -2/17, patient had  extensive DVTs in right lower extremity and some left lower extremity and was started on Lovenox.  Currently on heparin drip in setting of possible additional surgeries, thought review of plastics note 3/2 indicates ok to transition to oral anticoagulants, will initiate with pharm assist -Plan to cont DOAC on d/c   Paroxysmal atrial fibrillation with RVR  -Continue amiodarone 200 mg daily, metoprolol 50 mg twice daily -intermittent break thru, but tx limited by hypotension, monitor closely and consider cards opinion if no improvement   CKD 2 -Creatinine remains at baseline.  Slightly trending up.  Continue to monitor -Avoid nephrotoxic medications   Anemia due to chronic kidney disease Recent history of GI bleed -Baseline hemoglobin close to 9,  -hgb 9.3    08/02/2021.   -Per chart review, patient had GI bleeding while in the hospital in [place].  EGD on 05/23/21 showed gastric ulcer and required multiple units PRBCs. -Continue to monitor. -Continue iron supplement, PPI twice daily.   Functional deficits  -secondary to critical illness myopathy, necrotizing fasciitis, prolonged hospitalization, respiratory failure -Was in rehab since 2/15 to 2/28   Anxiety/depression -While at CIR, patient was on buspirone 7.5 mg twice daily, modafinil 100 mg daily, Lyrica 50 mg daily, Zoloft 100 mg at bedtime, Risperdal 1 mg twice daily, primidone 50 mg daily.  At this time it is not clear if patient was taking any of these medications prior to his hospitalization in January.  I believe some of them were added during the course of the last 2 months in the hospital, LTAC and rehab.   DVT prophylaxis: [id number]  Code Status: Full    Code Status Orders  (From admission, onward)           Start     Ordered   07/29/21 2137  Full code  Continuous        07/29/21 2137           Code Status History     Date Active Date Inactive Code Status Order ID Comments User Context   07/29/2021 2017  07/29/2021 2137 Full Code [id number]  Lennice Sites, MD Inpatient   07/15/2021 1638 07/29/2021 1458 Full Code [id number]  Flora Lipps Inpatient   06/10/2021 2253 07/15/2021 1627 Full Code [id number]  Genella Rife Inpatient      Family Communication: discussed with daughter sunday  Disposition Plan:    Not medically stable, transitioning off heparin drip, anticipate discontinuation of wound VAC,  Consults called: None Admission status: Inpatient   Consultants:  Plastic surgery, palliative care  Procedures:  DG Knee 1-2 Views Left  Result Date: 07/21/2021 CLINICAL DATA:  Left knee pain after attempting to walk in rehab. History of fall. EXAM: LEFT KNEE - 1-2 VIEW COMPARISON:  None. FINDINGS: Small knee joint effusion. No fracture or dislocation. No evidence of lipohemarthrosis. 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 No evidence of chondrocalcinosis. Scattered adjacent vascular calcifications. No radiopaque foreign body. IMPRESSION: 1. Small knee joint effusion without associated fracture or lipohemarthrosis. 2. 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 Electronically Signed   By: Jenny Reichmann  Watts M.D.   On: 07/21/2021 16:56   NM Myocar Multi W/Spect W/Wall Motion / EF  Result Date: 07/22/2021   Findings are equivocal.   There is infiltration of the IV with extravasation of the myoview into the patients arm during the stress images.   The study in uninterpretable .   DG Chest Port 1 View  Result Date: 07/15/2021 CLINICAL DATA:  66 year old male with acute on chronic respiratory failure. EXAM: PORTABLE CHEST 1 VIEW COMPARISON:  Portable chest 07/06/2021 and earlier. FINDINGS: Portable AP semi upright view at 0630 hours. Satisfactory tracheostomy. Stable mild cardiomegaly. Other mediastinal contours are within  normal limits. Improved lung volumes. Allowing for portable technique the lungs are clear. No pneumothorax or pleural effusion. No acute osseous abnormality identified. Paucity of bowel gas in the upper abdomen. IMPRESSION: Satisfactory tracheostomy.  No acute cardiopulmonary abnormality. Electronically Signed   By: Genevie Ann M.D.   On: 07/15/2021 07:21   DG Chest Port 1 View  Result Date: 07/06/2021 CLINICAL DATA:  66 year old male with tracheostomy. Removed dialysis catheter. EXAM: PORTABLE CHEST 1 VIEW COMPARISON:  Portable chest 07/01/2021 and earlier. FINDINGS: Portable AP semi upright view at 0540 hours. Right chest dual lumen catheter removed. Tracheostomy remains in place, stable. Mildly lower lung volumes. Stable cardiac size and mediastinal contours. No pneumothorax, pleural effusion, consolidation, or pleural effusion. Mild perihilar atelectasis suspected and stable. No new pulmonary opacity. No acute osseous abnormality identified. Paucity of bowel gas in the visible upper abdomen. IMPRESSION: 1. Right chest dialysis catheter removed. 2. No acute cardiopulmonary abnormality.  Stable mild atelectasis. Electronically Signed   By: Genevie Ann M.D.   On: 07/06/2021 06:08   ECHOCARDIOGRAM COMPLETE  Result Date: 07/22/2021    ECHOCARDIOGRAM REPORT   Patient Name:   JAVAUGHN OPDAHL [facility] Date of Exam: 07/22/2021 Medical Rec #:  [id number]              Height:       67.0 in Accession #:    [id number]             Weight:       248.0 lb Date of Birth:  Aug 29, 1955              BSA:          2.216 m Patient Age:    80 years               BP:           116/60 mmHg Patient Gender: M                      HR:           112 bpm. Exam Location:  Inpatient Procedure: 2D Echo, Cardiac Doppler and Color Doppler Indications:    I50.21 CHF  History:        Patient has no prior history of Echocardiogram examinations.                 Cardiomyopathy, CAD, COPD; Risk Factors:Diabetes.  Sonographer:    Beryle Beams Referring Phys:  [id number] RHONDA G BARRETT  Sonographer Comments: Patient is morbidly obese. IMPRESSIONS  1. Left ventricular ejection fraction, by estimation, is 40 to 45%. The left ventricle has mildly decreased function. Left ventricular endocardial border not optimally defined to evaluate regional wall motion. There is mild left ventricular hypertrophy.  Left ventricular diastolic parameters are indeterminate.  2. Right ventricular systolic function is normal. The right ventricular  size is normal.  3. The mitral valve is normal in structure. No evidence of mitral valve regurgitation. No evidence of mitral stenosis.  4. The aortic valve is calcified. There is mild calcification of the aortic valve. There is mild thickening of the aortic valve. Aortic valve regurgitation is not visualized. Aortic valve sclerosis is present, with no evidence of aortic valve stenosis.  5. The inferior vena cava is normal in size with greater than 50% respiratory variability, suggesting right atrial pressure of 3 mmHg. FINDINGS  Left Ventricle: Left ventricular ejection fraction, by estimation, is 40 to 45%. The left ventricle has mildly decreased function. Left ventricular endocardial border not optimally defined to evaluate regional wall motion. The left ventricular internal cavity size was normal in size. There is mild left ventricular hypertrophy. Left ventricular diastolic parameters are indeterminate. Right Ventricle: The right ventricular size is normal. No increase in right ventricular wall thickness. Right ventricular systolic function is normal. Left Atrium: Left atrial size was normal in size. Right Atrium: Right atrial size was normal in size. Pericardium: There is no evidence of pericardial effusion. Mitral Valve: The mitral valve is normal in structure. No evidence of mitral valve regurgitation. No evidence of mitral valve stenosis. Tricuspid Valve: The tricuspid valve is normal in structure. Tricuspid valve regurgitation is not  demonstrated. No evidence of tricuspid stenosis. Aortic Valve: The aortic valve is calcified. There is mild calcification of the aortic valve. There is mild thickening of the aortic valve. Aortic valve regurgitation is not visualized. Aortic valve sclerosis is present, with no evidence of aortic valve stenosis. Aortic valve mean gradient measures 3.0 mmHg. Aortic valve peak gradient measures 5.3 mmHg. Aortic valve area, by VTI measures 1.96 cm. Pulmonic Valve: The pulmonic valve was normal in structure. Pulmonic valve regurgitation is not visualized. No evidence of pulmonic stenosis. Aorta: The aortic root is normal in size and structure. Venous: The inferior vena cava is normal in size with greater than 50% respiratory variability, suggesting right atrial pressure of 3 mmHg. IAS/Shunts: No atrial level shunt detected by color flow Doppler.  LEFT VENTRICLE PLAX 2D LVIDd:         5.00 cm LVIDs:         4.30 cm LV PW:         1.40 cm LV IVS:        1.00 cm LVOT diam:     2.20 cm LV SV:         41 LV SV Index:   18 LVOT Area:     3.80 cm  LV Volumes (MOD) LV vol d, MOD A2C: 88.5 ml LV vol d, MOD A4C: 102.0 ml LV vol s, MOD A2C: 43.1 ml LV vol s, MOD A4C: 56.4 ml LV SV MOD A2C:     45.4 ml LV SV MOD A4C:     102.0 ml LV SV MOD BP:      45.9 ml RIGHT VENTRICLE             IVC RV S prime:     10.30 cm/s  IVC diam: 2.00 cm TAPSE (M-mode): 1.8 cm LEFT ATRIUM             Index        RIGHT ATRIUM           Index LA diam:        4.00 cm 1.81 cm/m   RA Area:     17.20 cm LA Vol (A2C):   48.0 ml  21.66 ml/m  RA Volume:   51.90 ml  23.42 ml/m LA Vol (A4C):   62.9 ml 28.39 ml/m LA Biplane Vol: 55.9 ml 25.23 ml/m  AORTIC VALVE                    PULMONIC VALVE AV Area (Vmax):    2.61 cm     PV Vmax:       0.70 m/s AV Area (Vmean):   2.36 cm     PV Vmean:      47.000 cm/s AV Area (VTI):     1.96 cm     PV VTI:        0.131 m AV Vmax:           115.00 cm/s  PV Peak grad:  1.9 mmHg AV Vmean:          82.900 cm/s  PV Mean  grad:  1.0 mmHg AV VTI:            0.208 m AV Peak Grad:      5.3 mmHg AV Mean Grad:      3.0 mmHg LVOT Vmax:         79.10 cm/s LVOT Vmean:        51.400 cm/s LVOT VTI:          0.107 m LVOT/AV VTI ratio: 0.52  AORTA Ao Root diam: 2.60 cm Ao Asc diam:  2.10 cm  SHUNTS Systemic VTI:  0.11 m Systemic Diam: 2.20 cm Candee Furbish MD Electronically signed by Candee Furbish MD Signature Date/Time: 07/22/2021/11:05:23 AM    Final    ECHOCARDIOGRAM STRESS TEST  Result Date: 07/24/2021     DOBUTAMINE STRESS ECHO REPORT    -------------------------------------------------------------------------------- Patient Name:   JANZEN SACKS [facility] Date of Exam: 07/24/2021 Medical Rec #:  [id number]              Height:       67.0 in Accession #:    [id number]             Weight:       248.9 lb Date of Birth:  11-13-1955              BSA:          2.219 m Patient Age:    22 years               BP:           103/63 mmHg Patient Gender: M                      HR:           109 bpm. Exam Location:  Inpatient Procedure: Dobutamine Stress Indications:    pre-operative evaluation  History:        Patient has prior history of Echocardiogram examinations, most                 recent 07/22/2021.  Sonographer:    Johny Chess RDCS Referring Phys: [id number] [place]  1. This is a negative stress echocardiogram for ischemia.  2. This is a low risk study. FINDINGS Exam Protocol: Definity contrast agent was given IV to delineate the left ventricular endocardial borders.  Patient Performance: The baseline heart rate was 93 bpm. The heart rate at peak stress was 131 bpm. The target heart rate was calculated to be 131 bpm. The percentage of maximum predicted heart rate achieved was 84.9 %.  The baseline blood pressure was 103/63 mmHg. The blood pressure at peak stress was 103/63 mmHg. The patient developed no symptoms during the stress exam.  EKG: Resting EKG showed atrial fibrillation. The patient developed no abnormal EKG  findings during exercise.  2D Echo Findings: The baseline ejection fraction was 60%. The peak ejection fraction at stress was 80%. Baseline regional wall motion abnormalities were not present. There were no stress-induced wall motion abnormalities. This is a negative stress echocardiogram for ischemia.  Candee Furbish MD Electronically signed on 07/24/2021 at 3:44:31 PM    Final    CT VENOGRAM ABD/PEL  Result Date: 07/16/2021 CLINICAL DATA:  Deep venous thrombosis (DVT) EXAM: CT VENOGRAM ABDOMEN AND PELVIS WITH CONTRAST TECHNIQUE: Multidetector CT imaging of the abdomen and pelvis was performed using the standard protocol following bolus administration of intravenous contrast. RADIATION DOSE REDUCTION: This exam was performed according to the departmental dose-optimization program which includes automated exposure control, adjustment of the mA and/or kV according to patient size and/or use of iterative reconstruction technique. CONTRAST:  183m OMNIPAQUE IOHEXOL 350 MG/ML SOLN COMPARISON:  Chest XR, 07/15/2021. FINDINGS: Suboptimal evaluation, secondary to poor contrast opacification such that nonocclusive thrombus could missed. Lower chest: Small volume RIGHT pleural effusion. Cardiomegaly with multivessel coronary atherosclerosis. Hepatobiliary: Mild nodularity of liver contour, suspicious for cirrhosis. No focal liver lesion is seen. No gallstones, gallbladder wall thickening, or biliary dilatation. Pancreas: No pancreatic ductal dilatation or surrounding inflammatory changes. Spleen: Normal in size without focal abnormality. Perihilar accessory spleen. Adrenals/Urinary Tract: Adrenal glands are unremarkable. Kidneys are normal, without renal calculi, focal lesion, or hydronephrosis. Mild distention of the urinary bladder. Anterior non dependent intraluminal gas, within urinary bladder, correlate for recent catheterization. Stomach/Bowel: Gastrostomy, well-positioned at the antral stomach. Stomach is otherwise  within normal limits. Nonobstructed small bowel. Appendix is not definitively visualized. Nondilated colon. Postsurgical changes of LEFT colectomy with intact-appearing anastomosis. No evidence of bowel wall thickening, distention, or inflammatory changes. Vascular/Lymphatic: Aortic atherosclerosis without aneurysmal dilation. No enlarged abdominal or pelvic lymph nodes. *Venous expansion and appearance of filling defect involving the RIGHT SFV, profunda vein and CFV. *No evidence of thrombus extension into the RIGHT EIV. *Suspect additional venous expansion and filling defect involving the LEFT CFV. See key image. *No evidence of filling defect within the IVC or visualized portions of the pelvic veins. Reproductive: Prostate is unremarkable. Other: Gynecomastia. Anterior abdominal wall contusions, likely injection sites. No abdominal wall hernia. No abdominopelvic ascites. Musculoskeletal: No acute osseous findings. IMPRESSION: Suboptimal evaluation, within these constraints; 1. RIGHT lower extremity DVT, involving the RIGHT femoral vein, profunda and CFV. 2. Suspected additional LEFT lower extremity DVT, involving the LEFT CFV. 3. No CTV evidence of thrombus extension into the pelvic veins. These results will be called to the ordering clinician or representative by the Radiologist Assistant, and communication documented in the PACS or [employer] JMichaelle Birks MD [facility] Electronically Signed   By: JMichaelle BirksM.D.   On: 07/16/2021 16:17   VAS [place]OWER EXTREMITY VENOUS (DVT)  Result Date: 07/16/2021  Lower Venous DVT Study Patient [facility]  [facility] Date of Exam:   07/16/2021 Medical Rec #: [id number]              Accession #:    [id number] of Birth: 5Apr 06, 1957              Patient Gender: M Patient Age:   89years [facility] Procedure:      VAS [place] LOWER EXTREMITY VENOUS (DVT) Referring Phys: PAMELA  LOVE --------------------------------------------------------------------------------  Indications: Immobility.  Limitations: Body habitus, poor ultrasound/tissue interface and left calf bandages. Performing Technologist: Archie Patten RVS  Examination Guidelines: A complete evaluation includes B-mode imaging, spectral Doppler, color Doppler, and power Doppler as needed of all accessible portions of each vessel. Bilateral testing is considered an integral part of a complete examination. Limited examinations for reoccurring indications may be performed as noted. The reflux portion of the exam is performed with the patient in reverse Trendelenburg.  +---------+---------------+---------+-----------+----------+-------------------+  RIGHT     Compressibility Phasicity Spontaneity Properties Thrombus Aging       +---------+---------------+---------+-----------+----------+-------------------+  CFV       None            No        No                     Age Indeterminate    +---------+---------------+---------+-----------+----------+-------------------+  SFJ       None                                             Age Indeterminate    +---------+---------------+---------+-----------+----------+-------------------+  FV Prox   None                                             Age Indeterminate    +---------+---------------+---------+-----------+----------+-------------------+  FV Mid    None                                             Age Indeterminate    +---------+---------------+---------+-----------+----------+-------------------+  FV Distal None                                             Age Indeterminate    +---------+---------------+---------+-----------+----------+-------------------+  PFV       None                                             Age Indeterminate    +---------+---------------+---------+-----------+----------+-------------------+  POP       None            No        No                                           +---------+---------------+---------+-----------+----------+-------------------+  PTV       None                                                                  +---------+---------------+---------+-----------+----------+-------------------+  PERO                                                       Not well visualized  +---------+---------------+---------+-----------+----------+-------------------+  EIV                                                        patent with color                                                                doppler              +---------+---------------+---------+-----------+----------+-------------------+   +---------+---------------+---------+-----------+----------+-------------------+  LEFT      Compressibility Phasicity Spontaneity Properties Thrombus Aging       +---------+---------------+---------+-----------+----------+-------------------+  CFV       None            Yes       Yes                    Age Indeterminate    +---------+---------------+---------+-----------+----------+-------------------+  SFJ       None                                             Age Indeterminate    +---------+---------------+---------+-----------+----------+-------------------+  FV Prox   Full                                                                  +---------+---------------+---------+-----------+----------+-------------------+  FV Mid    Full            Yes       Yes                                         +---------+---------------+---------+-----------+----------+-------------------+  FV Distal Full            Yes       Yes                                         +---------+---------------+---------+-----------+----------+-------------------+  PFV       Full                                                                  +---------+---------------+---------+-----------+----------+-------------------+  POP       Full            Yes       Yes                                          +---------+---------------+---------+-----------+----------+-------------------+  PTV                                                        not visualized due                                                               to bandages          +---------+---------------+---------+-----------+----------+-------------------+  PERO                                                       not visualized due                                                               to bandages          +---------+---------------+---------+-----------+----------+-------------------+  EIV                                                        patent with color                                                                doppler              +---------+---------------+---------+-----------+----------+-------------------+     Summary: RIGHT: - Findings consistent with age indeterminate deep vein thrombosis involving the right common femoral vein, SF junction, right femoral vein, right proximal profunda vein, right popliteal vein, and right posterior tibial veins. - No cystic structure found in the popliteal fossa.  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 - No cystic structure found in the popliteal fossa.  *See table(s) above for measurements and observations. Electronically signed by Jamelle Haring on 07/16/2021 at 2:29:30 PM.    Final     Antimicrobials:  None currently   Subjective: Slowly improving, heart rate and blood pressure improving Reports starting to feel "a little bit better"  Objective: Vitals:   08/02/21 0723 08/02/21 0731 08/02/21 0947 08/02/21 1122  BP: 110/79  115/69   Pulse: (!) 104 (!) 111 (!) 106 (!) 101  Resp: '18 20  18  '$ Temp:      TempSrc:      SpO2:  98%  96%    Intake/Output Summary (Last 24 hours) at 08/02/2021 1324 Last data filed at 08/02/2021 1300 Gross per 24 hour  Intake 2674.6 ml  Output 3450 ml  Net  -775.4 ml   There were no vitals filed for this visit.  Examination:  General exam: Elderly Caucasian male, obese, trach/PEG Skin: No rashes, lesions or ulcers. HEENT: Atraumatic, normocephalic, no obvious bleeding Lungs: Diminished air entry in both bases CVS: Regular rate and rhythm, no murmur GI/Abd soft, distended from obesity, nontender.  PEG tube in place CNS: Alert, awake, oriented x3.  Able to have a meaningful conversation. Psychiatry: flat affect,  Extremities: Anasarca     Data Reviewed: I have personally reviewed following labs and imaging studies  CBC: Recent Labs  Lab 07/27/21 0722 07/28/21 0503 07/29/21 0618 07/29/21 2139 07/30/21 0243 07/31/21 0227 08/01/21 0218 08/02/21 0201  WBC 2.9*   < > 3.1* 4.3 4.3 5.3 6.1 5.8  NEUTROABS 1.8  --  1.8  --  3.8  --   --   --   HGB 8.8*   < > 8.3* 10.0* 9.0* 9.1* 9.9* 9.3*  HCT 28.7*   < > 26.3* 31.8* 29.1* 29.4* 31.8* 29.4*  MCV 89.7   < > 88.9 90.1 89.8 90.7 91.1 89.6  PLT 136*   < > 139* 136* 135* 144* 148* 137*   < > = values in this interval not displayed.   Basic Metabolic Panel: Recent Labs  Lab 07/27/21 0722 07/29/21 0618 07/30/21 0243 07/31/21 0227 08/02/21 0201  NA 135 136 137 132* 136  K 3.1* 3.5 4.5 3.3* 3.5  CL 99 100 102 100 105  CO2 '26 24 23 23 23  '$ GLUCOSE 95 111* 177* 132* 95  BUN '17 22 18 17 14  '$ CREATININE 1.31* 1.28* 1.30* 1.15 1.00  CALCIUM 8.2* 8.4* 8.1* 7.8* 7.8*  MG  --   --  2.1 2.2  --   PHOS  --   --  5.3* 3.1  --    GFR: Estimated Creatinine Clearance: 88.5 mL/min (by C-G formula based on SCr of 1 mg/dL). Liver Function Tests: Recent Labs  Lab 07/30/21 0243  AST 12*  ALT 14  ALKPHOS 75  BILITOT 0.4  PROT 5.6*  ALBUMIN 2.5*   No results for input(s): LIPASE, AMYLASE in the last 168 hours. No results for input(s): AMMONIA in the last 168 hours. Coagulation Profile: No results for input(s): INR, PROTIME in the last 168 hours. Cardiac Enzymes: No results for input(s):  CKTOTAL, CKMB, CKMBINDEX, TROPONINI in the last 168 hours. BNP (last 3 results) No results for input(s): PROBNP in the last 8760 hours. HbA1C: No results for input(s): HGBA1C in the last 72 hours. CBG: Recent Labs  Lab 08/02/21 0021 08/02/21 0122 08/02/21 0443 08/02/21 0617 08/02/21 1251  GLUCAP 120* 100* 84 102* 104*   Lipid Profile: No results for input(s): CHOL, HDL, LDLCALC, TRIG, CHOLHDL, LDLDIRECT in the last 72 hours. Thyroid Function Tests: No results for input(s): TSH, T4TOTAL, FREET4, T3FREE, THYROIDAB in the last 72 hours. Anemia Panel: No results for input(s): VITAMINB12, FOLATE, FERRITIN, TIBC, IRON, RETICCTPCT in the last 72 hours. Sepsis Labs: No results for input(s): PROCALCITON, LATICACIDVEN in  the last 168 hours.  Recent Results (from the past 240 hour(s))  SARS Coronavirus 2 by RT PCR (hospital order, performed in [facility] hospital lab) Nasopharyngeal Nasopharyngeal Swab     Status: None   Collection Time: 07/29/21  8:36 AM   Specimen: Nasopharyngeal Swab  Result Value Ref Range Status   SARS Coronavirus 2 NEGATIVE NEGATIVE Final    Comment: (NOTE) SARS-CoV-2 target nucleic acids are NOT DETECTED.  The SARS-CoV-2 RNA is generally detectable in upper and lower respiratory specimens during the acute phase of infection. The lowest concentration of SARS-CoV-2 viral copies this assay can detect is 250 copies / mL. A negative result does not preclude SARS-CoV-2 infection and should not be used as the sole basis for treatment or other patient management decisions.  A negative result may occur with improper specimen collection / handling, submission of specimen other than nasopharyngeal swab, presence of viral mutation(s) within the areas targeted by this assay, and inadequate number of viral copies (<250 copies / mL). A negative result must be combined with clinical observations, patient history, and epidemiological information.  Fact Sheet for Patients:    StrictlyIdeas.no  Fact Sheet for Healthcare Providers: BankingDealers.co.za  This test is not yet approved or  cleared by the [place] FDA and has been authorized for detection and/or diagnosis of SARS-CoV-2 by FDA under an Emergency Use Authorization (EUA).  This EUA will remain in effect (meaning this test can be used) for the duration of the COVID-19 declaration under Section 564(b)(1) of the Act, 21 U.S.C. section 360bbb-3(b)(1), unless the authorization is terminated or revoked sooner.  Performed at [facility], [place] [address] [place], [place] [address]          Radiology Studies: No results found.      Scheduled Meds:  acetaminophen  1,000 mg Per Tube Q6H   amantadine  50 mg Per Tube Daily   amiodarone  200 mg Per Tube Daily   vitamin C  500 mg Per Tube Daily   busPIRone  7.5 mg Per Tube BID   chlorhexidine  15 mL Mouth Rinse BID   cholecalciferol  1,000 Units Per Tube Daily   docusate  100 mg Per Tube BID   feeding supplement  237 mL Oral TID BM   ferrous sulfate  300 mg Per Tube Daily   furosemide  40 mg Intravenous Daily   insulin aspart  0-9 Units Subcutaneous Q4H   insulin glargine-yfgn  15 Units Subcutaneous QHS   latanoprost  1 drop Both Eyes QHS   levothyroxine  75 mcg Per Tube QAC breakfast   loratadine  10 mg Per Tube Daily   mouth rinse  15 mL Mouth Rinse q12n4p   melatonin  3 mg Per Tube QHS   metoprolol tartrate  12.5 mg Per Tube BID   multivitamin with minerals  1 tablet Per Tube QHS   pantoprazole sodium  40 mg Per Tube BID   polyvinyl alcohol  1 drop Both Eyes QID   predniSONE  10 mg Per Tube Q breakfast   pregabalin  50 mg Per Tube Daily   risperiDONE  1 mg Per Tube BID   sertraline  100 mg Per Tube QHS   sodium chloride flush  3 mL Intravenous Q12H   zinc sulfate  220 mg Oral Daily   Continuous Infusions:  sodium chloride     heparin 1,500 Units/hr (08/02/21 0942)    lactated ringers 10 mL/hr at 08/02/21 0700  LOS: 4 days    Time spent: 35 min    Nicolette Bang, MD [facility]  If 7PM-7AM, please contact night-coverage  08/02/2021, 1:24 PM

## 2021-08-02 NOTE — Plan of Care (Signed)

## 2021-08-02 NOTE — Progress Notes (Addendum)
ANTICOAGULATION CONSULT NOTE - Follow Up Consult ? ?Pharmacy Consult for heparin>>apixaban ?Indication: DVT ? ?Labs: ?Recent Labs  ?  07/31/21 ?0227 08/01/21 ?0218 08/02/21 ?0201  ?HGB 9.1* 9.9* 9.3*  ?HCT 29.4* 31.8* 29.4*  ?PLT 144* 148* 137*  ?HEPARINUNFRC 0.65 0.53 0.38  ?CREATININE 1.15  --  1.00  ? ? ? ?Assessment/Plan:  ?66yo male with DVT per doppler 2/16(age indeterminate    DVT  left femoral vein and SF junction). Heparin resumed post-op 07/29/21.   ? ?Heparin level remains therapeutic this morning at 0.38. Hgb stable at 9.3 and plts low but stable at 137. No bleeding noted other than some ecchymosis.  ? ?Goal heparin level: 0.3-0.7 ? ?Continue heparin at 1500 units/hour  ?Follow up daily HL, CBC, s/s of bleeding ?TOC consult placed for assistance with DOAC > f/u changing from heparin to oral anticoagulation ? ?Eddie Candle, PharmD, BCPS ?Clinical Pharmacist ?08/02/2021 8:48 AM ? ? ?Addendum ? ?Change to apixaban '5mg'$  BID today.  ?Rx will follow peripherally ? ?Onnie Boer, PharmD, BCIDP, AAHIVP, CPP ?Infectious Disease Pharmacist ?08/02/2021 2:22 PM ? ? ? ? ? ?

## 2021-08-02 NOTE — Progress Notes (Signed)
Yellow MEWS as below.  Scheduled HS Metoprolol given, as well as pain medications.  Pt in no distress.  Charge notified. ? ? 08/02/21 2014  ?Assess: MEWS Score  ?Temp 98.9 ?F (37.2 ?C)  ?BP 116/73  ?Pulse Rate (!) 117  ?ECG Heart Rate (!) 117  ?Resp 17  ?Level of Consciousness Alert  ?SpO2 98 %  ?O2 Device Room Air  ?Assess: MEWS Score  ?MEWS Temp 0  ?MEWS Systolic 0  ?MEWS Pulse 2  ?MEWS RR 0  ?MEWS LOC 0  ?MEWS Score 2  ?MEWS Score Color Yellow  ?Assess: if the MEWS score is Yellow or Red  ?Were vital signs taken at a resting state? Yes  ?Focused Assessment No change from prior assessment  ?Early Detection of Sepsis Score *See Row Information* Low  ?MEWS guidelines implemented *See Row Information* No, other (Comment) ?(Pt with Afib, HR variable.)  ?Treat  ?MEWS Interventions Administered scheduled meds/treatments;Administered prn meds/treatments ?(Metoprolol)  ?Pain Scale 0-10  ?Pain Score 7  ?Pain Location Generalized  ?Pain Orientation Left ?("whole left side")  ?Pain Intervention(s) Medication (See eMAR);Repositioned;Emotional support  ?Notify: Charge Nurse/RN  ?Name of Charge Nurse/RN Notified Estill Bamberg Pettiford RN  ?Date Charge Nurse/RN Notified 08/02/21  ?Time Charge Nurse/RN Notified 2054  ? ?Ayesha Mohair BSN RN CMSRN ?08/02/2021, 8:55 PM ? ?

## 2021-08-02 NOTE — Consult Note (Signed)
[facility] Nurse Consult Note: ?Bedside RN requested guidance on draining graft site.  Plastics (Dr. Erin Hearing) is involved in this patient's care and Bedside RN will direct inquiries regarding the care of these sites to that Provider in the am. ? ?[facility] nursing team will not follow, but will remain available to this patient, the nursing and medical teams.  Please re-consult if needed. ?Thanks, ?Maudie Flakes, MSN, RN, [place], [place], CWON-AP, [place]  ?Pager# [phone]  ? ?  ?

## 2021-08-02 NOTE — Progress Notes (Signed)
Pt's skin graft appears saturated two days in a row.  Took off top layer of saturated Abd pads and redressed with new abd pads and secured with tape.  Ace bandage close to wound vac has a  damp round spot under the knee.   Leg elevated.  Compression dressing needs to be changed per surgeon.  Notified Dr. Berneice Gandy drainage.  Will place wound care consult.   ?

## 2021-08-03 ENCOUNTER — Other Ambulatory Visit (HOSPITAL_COMMUNITY): Payer: Self-pay

## 2021-08-03 DIAGNOSIS — R5381 Other malaise: Secondary | ICD-10-CM

## 2021-08-03 DIAGNOSIS — S81802A Unspecified open wound, left lower leg, initial encounter: Secondary | ICD-10-CM | POA: Diagnosis not present

## 2021-08-03 DIAGNOSIS — N1831 Chronic kidney disease, stage 3a: Secondary | ICD-10-CM | POA: Diagnosis not present

## 2021-08-03 DIAGNOSIS — F419 Anxiety disorder, unspecified: Secondary | ICD-10-CM | POA: Diagnosis not present

## 2021-08-03 DIAGNOSIS — F32A Depression, unspecified: Secondary | ICD-10-CM

## 2021-08-03 DIAGNOSIS — I5042 Chronic combined systolic (congestive) and diastolic (congestive) heart failure: Secondary | ICD-10-CM | POA: Diagnosis not present

## 2021-08-03 DIAGNOSIS — S31829A Unspecified open wound of left buttock, initial encounter: Secondary | ICD-10-CM | POA: Diagnosis present

## 2021-08-03 LAB — GLUCOSE, CAPILLARY
Glucose-Capillary: 129 mg/dL — ABNORMAL HIGH (ref 70–99)
Glucose-Capillary: 100 mg/dL — ABNORMAL HIGH (ref 70–99)
Glucose-Capillary: 143 mg/dL — ABNORMAL HIGH (ref 70–99)
Glucose-Capillary: 179 mg/dL — ABNORMAL HIGH (ref 70–99)
Glucose-Capillary: 206 mg/dL — ABNORMAL HIGH (ref 70–99)
Glucose-Capillary: 195 mg/dL — ABNORMAL HIGH (ref 70–99)

## 2021-08-03 LAB — BASIC METABOLIC PANEL
Anion gap: 10 (ref 5–15)
BUN: 12 mg/dL (ref 8–23)
CO2: 22 mmol/L (ref 22–32)
Calcium: 7.8 mg/dL — ABNORMAL LOW (ref 8.9–10.3)
Chloride: 103 mmol/L (ref 98–111)
Creatinine, Ser: 1.08 mg/dL (ref 0.61–1.24)
GFR, Estimated: >60 mL/min (ref >60)
Glucose, Bld: 155 mg/dL — ABNORMAL HIGH (ref 70–99)
Potassium: 3.4 mmol/L — ABNORMAL LOW (ref 3.5–5.1)
Sodium: 135 mmol/L (ref 135–145)

## 2021-08-03 NOTE — Hospital Course (Addendum)
Tony Young is a 66 y.o. male with past medical history of obesity, obstructive sleep apnea, diabetes mellitus type 2, coronary artery disease, COPD, colon cancer who was recently admitted to [facility] in [place] for congestive heart failure on December 2022.  At that time patient had developed septic shock due to necrotizing fasciitis of the left lower extremity  with strep pyogenes bacteremia.  He was intubated for respiratory failure, required pressors, subsequently developed acute renal failure he required multiple I&D of left lower extremity.  He required CRRT and was transitioned to dialysis when stable.  He developed DVT and was started on heparin drip but that had to be stopped because of GI bleed.  Patient did have EGD on 12/24 showed gastric ulcer and required multiple units PRBCs.  On 12/27, he underwent trach/PEG.  He was [facility] on [facility]10/2021 for further management. While at [facility], he had pneumonia, COPD exacerbation, bouts of A-fib.  His respiratory status improved and he was liberated off the ventilator to oxygen by trach collar on 1/25.  He had issues with overload but was not able to aggressively diurese because of recurrent AKI.  He was admitted to CIR on 2/15 because of functional decline.  Per CIR note, patient was not able to participate with them as expected. Throughout the course of his illness since January, he was bedridden and sustained sacral ulcer.  While at rehabilitation, patient was seen by plastic surgery who recommended I&D of the left leg and sacral wound.  Patient underwent debridement of the left leg wound with skin grafting on 07/29/2021.  Plastic surgery following the patient during hospitalization.  Currently, patient is extremely debilitated and weak and is currently awaiting for skilled nursing facility placement.

## 2021-08-03 NOTE — Assessment & Plan Note (Addendum)
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  Status post tracheostomy 05/26/2021.  PCCM and respiratory therapy on board. Currently appears stable.

## 2021-08-03 NOTE — Progress Notes (Signed)
Physical Therapy Treatment ?Patient Details ?Name: Tony Young ?MRN: [id number] ?DOB: 1955/07/16 ?Today's Date: 08/03/2021 ? ? ?History of Present Illness Pt is a 66 y.o. male originally admitted to [facility] 05/13/21 with d/c to [facility] 06/10/21, readmission then transfer to CIR on 2/15; pt now admitted 07/29/21 for LLE and sacral wound debridement and skin graft. PMH includes CHF, COPD, CAD, DM, trach, peg. ? ?  ?PT Comments  ? ? Pt agreeable to mobility session today, with focus on EOB sitting balance and exercises. Pt requiring max +2 for moving to/from EOB, once EOB pt requiring mod truncal support to maintain upright mostly limited due to pain and weakness. Pt appears frustrated by current level of mobility, will continue to progress as able. Follow up recommendation updated given CIR signing off.   ? ?  ?Recommendations for follow up therapy are one component of a multi-disciplinary discharge planning process, led by the attending physician.  Recommendations may be updated based on patient status, additional functional criteria and insurance authorization. ? ?Follow Up Recommendations ? Skilled nursing-short term rehab (<3 hours/day) ?  ?  ?Assistance Recommended at Discharge Intermittent Supervision/Assistance  ?Patient can return home with the following Two people to help with walking and/or transfers;Two people to help with bathing/dressing/bathroom;Assistance with cooking/housework;Assist for transportation;Help with stairs or ramp for entrance ?  ?Equipment Recommendations ? Wheelchair (measurements PT);Wheelchair cushion (measurements PT);Hospital bed  ?  ?Recommendations for Other Services   ? ? ?  ?Precautions / Restrictions Precautions ?Precautions: Fall;Other (comment) ?Precaution Comments: trach, PEG, fragile skin; LE/sacral wound drainage ?Restrictions ?Weight Bearing Restrictions: No  ?  ? ?Mobility ? Bed Mobility ?Overal bed mobility: Needs Assistance ?Bed Mobility: Rolling, Sidelying to Sit,  Sit to Supine ?Rolling: Mod assist, +2 for safety/equipment, +2 for physical assistance ?Sidelying to sit: Max assist, +2 for physical assistance, HOB elevated ?  ?Sit to supine: Max assist, +2 for physical assistance, HOB elevated ?  ?General bed mobility comments: mod +2 for rolling bilat for trunk and LE management, pt using bedrails to assist in roll. Max +2 for supine<>sit for LE and truncal management, scooting to/from EOB using weight shifting L and R to avoid shearing on buttocks, and boost back up in bed. ?  ? ?Transfers ?  ?  ?  ?  ?  ?  ?  ?  ?  ?General transfer comment: unable to attempt given fatigue from EOB sitting ?  ? ?Ambulation/Gait ?  ?  ?  ?  ?  ?  ?  ?  ? ? ?Stairs ?  ?  ?  ?  ?  ? ? ?Wheelchair Mobility ?  ? ?Modified Rankin (Stroke Patients Only) ?  ? ? ?  ?Balance Overall balance assessment: Needs assistance ?Sitting-balance support: No upper extremity supported, Feet supported ?Sitting balance-Leahy Scale: Poor ?Sitting balance - Comments: heavy posterior leaning and preference for R lean to offweight L hip. At least mod truncal support needed at all times ?Postural control: Posterior lean, Right lateral lean ?  ?  ?  ?  ?  ?  ?  ?  ?  ?  ?  ?  ?  ?  ?  ? ?  ?Cognition Arousal/Alertness: Awake/alert ?Behavior During Therapy: Flat affect ?Overall Cognitive Status: Within Functional Limits for tasks assessed ?  ?  ?  ?  ?  ?  ?  ?  ?  ?  ?  ?  ?  ?  ?  ?  ?General Comments: can  get irritable during mobility, minimized if given space to rest and recover. ?  ?  ? ?  ?Exercises General Exercises - Lower Extremity ?Ankle Circles/Pumps: AROM, Both, 15 reps, Supine ?Long [address]: AAROM, Both, 15 reps, Seated ?Heel Slides: AAROM, Both, 10 reps, Supine ? ?  ?General Comments General comments (skin integrity, edema, etc.): wound vac to LLE, serosanguinous drainage from hip wound on bed pad and RN preparing to come in to do a dressing change ?  ?  ? ?Pertinent Vitals/Pain Pain Assessment ?Pain  Assessment: Faces ?Faces Pain Scale: Hurts even more ?Pain Location: L hip and LE, buttocks ?Pain Descriptors / Indicators: Discomfort, Guarding, Grimacing ?Pain Intervention(s): Limited activity within patient's tolerance, Monitored during session, Repositioned  ? ? ?Home Living   ?  ?  ?  ?  ?  ?  ?  ?  ?  ?   ?  ?Prior Function    ?  ?  ?   ? ?PT Goals (current goals can now be found in the care plan section) Acute Rehab PT Goals ?Patient Stated Goal: "I want to work on getting out of this bed" ?PT Goal Formulation: With patient ?Time For Goal Achievement: 08/13/21 ?Potential to Achieve Goals: Fair ?Progress towards PT goals: Progressing toward goals ? ?  ?Frequency ? ? ? Min 2X/week ? ? ? ?  ?PT Plan Discharge plan needs to be updated  ? ? ?Co-evaluation PT/OT/SLP Co-Evaluation/Treatment: Yes ?Reason for Co-Treatment: For patient/therapist safety;To address functional/ADL transfers;Complexity of the patient's impairments (multi-system involvement) ?PT goals addressed during session: Mobility/safety with mobility;Balance;Strengthening/ROM ?  ?  ? ?  ?AM-PAC PT "6 Clicks" Mobility   ?Outcome Measure ? Help needed turning from your back to your side while in a flat bed without using bedrails?: A Lot ?Help needed moving from lying on your back to sitting on the side of a flat bed without using bedrails?: Total ?Help needed moving to and from a bed to a chair (including a wheelchair)?: Total ?Help needed standing up from a chair using your arms (e.g., wheelchair or bedside chair)?: Total ?Help needed to walk in hospital room?: Total ?Help needed climbing 3-5 steps with a railing? : Total ?6 Click Score: 7 ? ?  ?End of Session   ?Activity Tolerance: Patient limited by fatigue;Patient limited by pain ?Patient left: in bed;with call bell/phone within reach;Other (comment) (soft call bell, pt's RN waiting outside to do dressing change) ?Nurse Communication: Mobility status;Other (comment) (request for prevalon boots for  heel protection and offweighting) ?PT Visit Diagnosis: Other abnormalities of gait and mobility (R26.89);Muscle weakness (generalized) (M62.81) ?  ? ? ?Time: 6384-6659 ?PT Time Calculation (min) (ACUTE ONLY): 30 min ? ?Charges:  $Therapeutic Activity: 8-22 mins          ?          ?Stacie Glaze, PT DPT ?Acute Rehabilitation Services ?Pager [phone]  ?Office [phone] ? ? ? ?Azaliyah Kennard E Stroup ?08/03/2021, 11:37 AM ? ?

## 2021-08-03 NOTE — Assessment & Plan Note (Addendum)
Sacral pressure ulceration. Present on admission.  Patient underwent debridement and skin grafting on 07/29/2021 by plastic surgery.  Continue dressing as per plastic surgery.Marland Kitchen

## 2021-08-03 NOTE — Progress Notes (Signed)
Occupational Therapy Treatment ?Patient Details ?Name: Tony Young ?MRN: [id number] ?DOB: [birth year] ?Today's Date: 08/03/2021 ? ? ?History of present illness Pt is a 66 y.o. male originally admitted to [facility] 05/13/21 with d/c to [facility] 06/10/21, readmission then transfer to CIR on 2/15; pt now admitted 07/29/21 for LLE and sacral wound debridement and skin graft. PMH includes CHF, COPD, CAD, DM, trach, peg. ?  ?OT comments ? Pt progressing towards goals, able to sit EOB this session for ADLs and UB/LB exercise for ~15 mins prior to fatiguing. Pt max A for grooming ADL, and performs UB/LB exercise with increased time, rest breaks needed in between. Pt presenting with impairments listed below, will follow acutely. Updating d/c recommendation to SNF as AIR/CIR signing off.  ? ?Recommendations for follow up therapy are one component of a multi-disciplinary discharge planning process, led by the attending physician.  Recommendations may be updated based on patient status, additional functional criteria and insurance authorization. ?   ?Follow Up Recommendations ? Skilled nursing-short term rehab (<3 hours/day)  ?  ?Assistance Recommended at Discharge Frequent or constant Supervision/Assistance  ?Patient can return home with the following ? A lot of help with bathing/dressing/bathroom;Assistance with cooking/housework;Assistance with feeding;Direct supervision/assist for medications management;Direct supervision/assist for financial management;Assist for transportation ?  ?Equipment Recommendations ? None recommended by OT;Other (comment) (defer to next venue of care)  ?  ?Recommendations for Other Services   ? ?  ?Precautions / Restrictions Precautions ?Precautions: Fall ?Precaution Comments: trach, PEG, fragile skin; LE/sacral wound drainage ?Restrictions ?Weight Bearing Restrictions: No  ? ? ?  ? ?Mobility Bed Mobility ?Overal bed mobility: Needs Assistance ?Bed Mobility: Rolling, Sidelying to Sit, Sit to  Supine ?Rolling: Mod assist, +2 for safety/equipment, +2 for physical assistance ?Sidelying to sit: Max assist, +2 for physical assistance, HOB elevated ?  ?Sit to supine: Max assist, +2 for physical assistance, HOB elevated ?  ?General bed mobility comments: mod +2 for rolling bilat for trunk and LE management, pt using bedrails to assist in roll. Max +2 for supine<>sit for LE and truncal management, scooting to/from EOB using weight shifting L and R to avoid shearing on buttocks, and boost back up in bed. ?  ? ?Transfers ?  ?  ?  ?  ?  ?  ?  ?  ?  ?General transfer comment: unable to attempt given fatigue from EOB sitting ?  ?  ?Balance Overall balance assessment: Needs assistance ?Sitting-balance support: No upper extremity supported, Feet supported ?Sitting balance-Leahy Scale: Poor ?Sitting balance - Comments: heavy posterior leaning and preference for R lean to offweight L hip. At least mod truncal support needed at all times ?Postural control: Posterior lean, Right lateral lean ?  ?  ?  ?  ?  ?  ?  ?  ?  ?  ?  ?  ?  ?  ?   ? ?ADL either performed or assessed with clinical judgement  ? ?ADL Overall ADL's : Needs assistance/impaired ?  ?  ?Grooming: Maximal assistance;Sitting ?Grooming Details (indicate cue type and reason): max A sitting EOB ?  ?  ?  ?  ?  ?  ?  ?  ?  ?  ?  ?  ?  ?  ?  ?  ?  ? ?Extremity/Trunk Assessment Upper Extremity Assessment ?Upper Extremity Assessment: RUE deficits/detail ?RUE Deficits / Details: Reports h/o R rotator cuff injury, flex/ABD AROM <90' able to tolerate <120' with PROM. Limited grip/hand strength, requiring use of soft call  bell ?RUE: Shoulder pain with ROM ?RUE Coordination: decreased fine motor;decreased gross motor ?LUE Deficits / Details: Limited grip/hand strength, requiring use of soft call bell ?LUE Coordination: decreased gross motor;decreased fine motor ?  ?Lower Extremity Assessment ?Lower Extremity Assessment: Defer to PT evaluation ?  ?  ?  ? ?Vision   ?Vision  Assessment?: No apparent visual deficits ?  ?Perception Perception ?Perception: Within Functional Limits ?  ?Praxis Praxis ?Praxis: Not tested ?  ? ?Cognition Arousal/Alertness: Awake/alert ?Behavior During Therapy: Flat affect ?Overall Cognitive Status: Within Functional Limits for tasks assessed ?  ?  ?  ?  ?  ?  ?  ?  ?  ?  ?  ?  ?  ?  ?  ?  ?General Comments: can get irritable during mobility, minimized if given space to rest and recover. ?  ?  ?   ?Exercises Exercises: General Upper Extremity ?General Exercises - Upper Extremity ?Shoulder Flexion: AROM, Both, 5 reps, Seated ? ?  ?Shoulder Instructions   ? ? ?  ?General Comments wound drainage, wound vac  ? ? ?Pertinent Vitals/ Pain       Pain Assessment ?Pain Assessment: Faces ?Pain Score: 6  ?Faces Pain Scale: Hurts even more ?Pain Location: L hip and LE, buttocks ?Pain Descriptors / Indicators: Discomfort, Guarding, Grimacing ?Pain Intervention(s): Limited activity within patient's tolerance, Monitored during session, Premedicated before session, Repositioned ? ?Home Living   ?  ?  ?  ?  ?  ?  ?  ?  ?  ?  ?  ?  ?  ?  ?  ?  ?  ?  ? ?  ?Prior Functioning/Environment    ?  ?  ?  ?   ? ?Frequency ? Min 2X/week  ? ? ? ? ?  ?Progress Toward Goals ? ?OT Goals(current goals can now be found in the care plan section) ? Progress towards OT goals: Progressing toward goals ? ?Acute Rehab OT Goals ?Patient Stated Goal: to ride his Mark[employer]aft ?OT Goal Formulation: With patient ?Time For Goal Achievement: 08/13/21 ?Potential to Achieve Goals: Good ?ADL Goals ?Pt Will Perform Grooming: with set-up;sitting ?Pt Will Perform Upper Body Bathing: with set-up;sitting;bed level ?Pt Will Perform Lower Body Bathing: sitting/lateral leans;with mod assist ?Pt Will Transfer to Toilet: with mod assist;with +2 assist;with transfer board;bedside commode  ?Plan Discharge plan needs to be updated   ? ?Co-evaluation ? ? ?   ?Reason for Co-Treatment: For patient/therapist safety;To address  functional/ADL transfers;Complexity of the patient's impairments (multi-system involvement) ?PT goals addressed during session: Mobility/safety with mobility;Balance;Strengthening/ROM ?  ?  ? ?  ?AM-PAC OT "6 Clicks" Daily Activity     ?Outcome Measure ? ? Help from another person eating meals?: A Little ?Help from another person taking care of personal grooming?: A Lot ?Help from another person toileting, which includes using toliet, bedpan, or urinal?: Total ?Help from another person bathing (including washing, rinsing, drying)?: A Lot ?Help from another person to put on and taking off regular upper body clothing?: A Lot ?Help from another person to put on and taking off regular lower body clothing?: Total ?6 Click Score: 11 ? ?  ?End of Session   ? ?OT Visit Diagnosis: Unsteadiness on feet (R26.81);Other abnormalities of gait and mobility (R26.89);Repeated falls (R29.6);Pain ?Pain - Right/Left: Left ?Pain - part of body: Leg ?  ?Activity Tolerance Patient limited by fatigue ?  ?Patient Left in bed;with call bell/phone within reach;with nursing/sitter in room ?  ?Nurse Communication   ?  ? ?   ? ?  Time: 9449-6759 ?OT Time Calculation (min): 32 min ? ?Charges: OT General Charges ?$OT Visit: 1 Visit ?OT Treatments ?$Self Care/Home Management : 8-22 mins ? ?Lynnda Child, OTD, OTR/L ?Acute Rehab ?[phone] ? ? ?Kaylyn Lim ?08/03/2021, 12:40 PM ?

## 2021-08-03 NOTE — Assessment & Plan Note (Addendum)
Deconditioning.  History of critical illness myopathy necrotizing fasciitis prolonged hospitalization with respiratory failure.  Was in CIR 2/15 to 2-28.  Awaiting for skilled nursing facility on discharge.

## 2021-08-03 NOTE — Progress Notes (Signed)
?PROGRESS NOTE ? ? ? ?Tony Young  OFB:[id number] DOB: April 04, 1956 DOA: 07/29/2021 ?PCP: Rosaria Ferries, MD  ? ? ?Brief Narrative:  ?Tony Young is a 66 y.o. male with past medical history of obesity, obstructive sleep apnea, diabetes mellitus type 2, coronary artery disease, COPD, colon cancer who was recently admitted to [facility] in [place] for congestive heart failure on December 2022.  At that time patient had developed septic shock due to necrotizing fasciitis of the left lower extremity  with strep pyogenes bacteremia.  He was intubated for respiratory failure, required pressors, subsequently developed acute renal failure he required multiple I&D of left lower extremity.  He required CRRT and was transitioned to dialysis when stable.  He developed DVT and was started on heparin drip but that had to be stopped because of GI bleed.  Patient did have EGD on 12/24 showed gastric ulcer and required multiple units PRBCs.  On 12/27, he underwent trach/PEG.  [facility] was [facility]21 for further management. While at [facility], he had pneumonia, COPD exacerbation, bouts of A-fib.  His respiratory status improved and he was liberated off the ventilator to oxygen by trach collar on 1/25.  He had issues with overload but was not able to aggressively diurese because of recurrent AKI.  He was admitted to CIR on 2/15 because of functional decline.  Per CIR note, patient was not able to participate with them as expected. Throughout the course of his illness since January, he was bedridden and sustained sacral ulcer.  While at rehabilitation patient was seen by plastic surgery who recommended I&D of the left leg and sacral wound.  Patient underwent debridement of the left leg wound with skin grafting on 07/29/2021. ?  ?  ?Assessment and Plan: ?* Open leg wound ?History of necrotizing fasciitis  ?Patient has had multiple I&D's in the past.  Underwent leg wound debridement and skin  grafting by plastic surgery on 07/29/2021.  Wound VAC in place.  Not on antibiotic at this time ?-Followed by plastic surgery Dr. Erin Hearing. ?  ? 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  Status post tracheostomy 05/26/2021.  PCCM and respiratory therapy on board.Per rehab note from 3/1, continue button plugging during the day and ATC at night for humidification ? ?Open wound of left buttock ?Sacral pressure ulceration. ?Patient underwent debridement and skin grafting on 07/29/2021 by plastic surgery ? ?Chronic combined systolic and diastolic CHF (congestive heart failure) ([place]) ?Diuretics were on hold at the CIR due to AKI.  2D echocardiogram 222 showed LV ejection fraction of 40 to 45%.  Myoview was negative for ischemia.  Patient has been on IV Lasix with holding parameters at this time.  We will continue to monitor closely. ? ?Controlled type 2 diabetes mellitus with hyperglycemia, with long-term current use of insulin ([place]) ?Hemoglobin A1c of 5.9 on 07/29/2021.  Continue Lantus sliding scale insulin. ? ?Dvt femoral (deep venous thrombosis) ([place]) ?Patient did have a history of extensive DVT in the right lower extremity on 07/17/21 and was on Lovenox.  Patient has been started on Eliquis at this time. ? ?Debility ?Deconditioning.  History of critical illness myopathy necrotizing fasciitis prolonged hospitalization with respiratory failure.  Was in CIR 2/15 to 2-28.  Likely need rehabilitation on discharge. ? ?Anxiety and depression ?Patient was getting buspirone modafinil Lyrica Zoloft and risperidone primidone at the CIR. currently on buspirone and sertraline. ? ?Anemia due to chronic kidney disease ?Recent history of  GI bleed.  ? Baseline hemoglobin of around 9.0.  Continue to monitor.  Patient had EGD 05/23/2021 which showed a gastric ulcer and required multiple units of PRBC.  Continue iron and PPI at this time ? ?CKD stage 2 due to type 2 diabetes mellitus  ([place]) ?Continue to monitor creatinine. ? ?Paroxysmal atrial fibrillation with RVR ([place]) ?On amiodarone, Eliquis and metoprolol.  Does have intermittent episodes of RVR.  Treatment at times limited by hypotension. ? ?OSA (obstructive sleep apnea) ?On tracheostomy collar at this time. ? ? ? ? DVT prophylaxis: SCD's Start: 07/29/21 2017 ?apixaban (ELIQUIS) tablet 5 mg  ? ?Code Status:   ?  Code Status: Full Code ? ?Disposition: Skilled nursing facility as per PT OT recommendation. ? ?Status is: Inpatient ? ?Remains inpatient appropriate because: Pending clinical improvement, need for placement, plastic surgery recommendations. ? ? Family Communication: Spoke with the patient at bedside. ? ?Consultants:  ?Plastic surgery ?Palliative care ? ?Procedures:  ?Left lower extremity debridement and skin grafting on 07/29/2021 by plastic surgery.   ?Debridement of the left sacral stage III pressure ulceration on 07/29/2021. ? ?Antimicrobials:  ?None ? ?Anti-infectives (From admission, onward)  ? ? None  ? ?  ? ? ?Subjective: ?Today, patient was seen and examined at bedside. patient denies overt pain.  Has had bowel movement yesterday.  Denies any nausea vomiting fever chills ? ?Objective: ?Vitals:  ? 08/03/21 0414 08/03/21 0851 08/03/21 0906 08/03/21 1141  ?BP: 101/68  121/60   ?Pulse: 98  (!) 110   ?Resp: 18  16   ?Temp: 98.8 ?F (37.1 ?C)  98 ?F (36.7 ?C)   ?TempSrc: Oral     ?SpO2: 94% 93% 94% 94%  ? ? ?Intake/Output Summary (Last 24 hours) at 08/03/2021 1418 ?Last data filed at 08/03/2021 0900 ?Gross per 24 hour  ?Intake 800 ml  ?Output 1200 ml  ?Net -400 ml  ? ?There were no vitals filed for this visit. ? ?Physical Examination: ? ?General: Obese built, not in obvious distress, tracheostomy in place ?HENT:   No scleral pallor or icterus noted. Oral mucosa is moist.  ?Chest:  Diminished breath sounds bilaterally. No crackles or wheezes.  ?CVS: S1 &S2 heard. No murmur.  Regular rate and rhythm. ?Abdomen: Soft, nontender, nondistended.   Bowel sounds are heard.  PEG tube in place. ?Extremities: Left lower extremity-thigh with wound VAC. ?Psych: Alert, awake and oriented, flat affect ?CNS:  No cranial nerve deficits.  Generalized weakness noted. ?Skin: Warm and dry.  Left lower extremity wound with wound VAC. ? ?Data Reviewed:  ? ?CBC: ?Recent Labs  ?Lab 07/29/21 ?0618 07/29/21 ?2139 07/30/21 ?0243 07/31/21 ?4709 08/01/21 ?6283 08/02/21 ?0201  ?WBC 3.1* 4.3 4.3 5.3 6.1 5.8  ?NEUTROABS 1.8  --  3.8  --   --   --   ?HGB 8.3* 10.0* 9.0* 9.1* 9.9* 9.3*  ?HCT 26.3* 31.8* 29.1* 29.4* 31.8* 29.4*  ?MCV 88.9 90.1 89.8 90.7 91.1 89.6  ?PLT 139* 136* 135* 144* 148* 137*  ? ? ?Basic Metabolic Panel: ?Recent Labs  ?Lab 07/29/21 ?6629 07/30/21 ?0243 07/31/21 ?4765 08/02/21 ?0201 08/03/21 ?0146  ?NA 136 137 132* 136 135  ?K 3.5 4.5 3.3* 3.5 3.4*  ?CL 100 102 100 105 103  ?CO2 '24 23 23 23 22  '$ ?GLUCOSE 111* 177* 132* 95 155*  ?BUN '22 18 17 14 12  '$ ?CREATININE 1.28* 1.30* 1.15 1.00 1.08  ?CALCIUM 8.4* 8.1* 7.8* 7.8* 7.8*  ?MG  --  2.1 2.2  --   --   ?  PHOS  --  5.3* 3.1  --   --   ? ? ?Liver Function Tests: ?Recent Labs  ?Lab 07/30/21 ?0243  ?AST 12*  ?ALT 14  ?ALKPHOS 75  ?BILITOT 0.4  ?PROT 5.6*  ?ALBUMIN 2.5*  ? ? ? ?Radiology Studies: ?No results found. ? ? ? LOS: 5 days  ? ? ?Flora Lipps, MD ?[facility] ?08/03/2021, 2:18 PM  ?  ?

## 2021-08-03 NOTE — Assessment & Plan Note (Addendum)
Latest creatinine at 1.1.  This has remained stable during hospitalization.

## 2021-08-03 NOTE — Assessment & Plan Note (Addendum)
History of necrotizing fasciitis  Patient has had multiple I&D's in the past.  Underwent leg wound debridement and skin grafting by plastic surgery on 07/29/2021.   Not on antibiotic at this time. Followed by plastic surgery Dr. Erin Hearing.  Wound care on board for dressing will need to continue on discharge.

## 2021-08-03 NOTE — Assessment & Plan Note (Addendum)
Patient did have a history of extensive DVT in the right lower extremity on 07/17/21 is on Eliquis at this time.  Hemoglobin is stable

## 2021-08-03 NOTE — Assessment & Plan Note (Addendum)
Patient was getting buspirone, modafinil, Lyrica Zoloft and risperidone, primidone at the CIR. currently on buspirone and sertraline.

## 2021-08-03 NOTE — Assessment & Plan Note (Addendum)
With hyperglycemia.  Hemoglobin A1c of 5.9 on 07/29/2021.  Continue Lantus, sliding scale insulin.  Now controlled.

## 2021-08-03 NOTE — Assessment & Plan Note (Addendum)
Recent history of GI bleed.   Baseline hemoglobin of around 9.0.  Continue to monitor.  Patient had EGD 05/23/2021 which showed a gastric ulcer and required multiple units of PRBC.  Continue iron and PPI.  Latest hemoglobin of 9.5.  No mention of further bleed.

## 2021-08-03 NOTE — Assessment & Plan Note (Signed)
On tracheostomy collar at this time.

## 2021-08-03 NOTE — Assessment & Plan Note (Addendum)
Appears compensated at this time.  2D echocardiogram 2/22 showed LV ejection fraction of 40 to 45%.  Myoview was negative for ischemia.  Patient has been on IV Lasix with holding parameters at this time.   Latest creatinine of 1.1 with BUN of 20.  Negative balance for 7591 ml.  Monitor BMP.  We will transition to oral Lasix on discharge.

## 2021-08-03 NOTE — Progress Notes (Addendum)
Consult received: Please assess access to Eliquis 5 mg BID and Xarelto 20 mg daily.  ?Request placed by NCM to pharmacy team and CMA for results.  ?TOC team will continue to monitor and assist with needs.... ?Whitman Hero RN,BSN,CM ?[phone] ? ? ?08/03/2021 '@1457'$  Per pharmacy: Eiliquis and Xarelto both returns a copay of $47 for 30 day supply. ?

## 2021-08-03 NOTE — Assessment & Plan Note (Addendum)
With episodes of RVR.  On amiodarone, Eliquis and metoprolol.  Increase the dose of metoprolol from 12.5-25 twice daily starting today.  Blood pressure overall improved.  On DuoNebs every 4 hourly from 2 hourly.

## 2021-08-04 DIAGNOSIS — S81802A Unspecified open wound, left lower leg, initial encounter: Secondary | ICD-10-CM | POA: Diagnosis not present

## 2021-08-04 DIAGNOSIS — E876 Hypokalemia: Secondary | ICD-10-CM | POA: Diagnosis not present

## 2021-08-04 DIAGNOSIS — N1831 Chronic kidney disease, stage 3a: Secondary | ICD-10-CM | POA: Diagnosis not present

## 2021-08-04 DIAGNOSIS — I5042 Chronic combined systolic (congestive) and diastolic (congestive) heart failure: Secondary | ICD-10-CM | POA: Diagnosis not present

## 2021-08-04 DIAGNOSIS — F419 Anxiety disorder, unspecified: Secondary | ICD-10-CM | POA: Diagnosis not present

## 2021-08-04 LAB — BASIC METABOLIC PANEL
Anion gap: 11 (ref 5–15)
BUN: 13 mg/dL (ref 8–23)
CO2: 24 mmol/L (ref 22–32)
Calcium: 8.5 mg/dL — ABNORMAL LOW (ref 8.9–10.3)
Chloride: 104 mmol/L (ref 98–111)
Creatinine, Ser: 1.21 mg/dL (ref 0.61–1.24)
GFR, Estimated: >60 mL/min (ref >60)
Glucose, Bld: 100 mg/dL — ABNORMAL HIGH (ref 70–99)
Potassium: 3.4 mmol/L — ABNORMAL LOW (ref 3.5–5.1)
Sodium: 139 mmol/L (ref 135–145)

## 2021-08-04 LAB — CBC
HCT: 36.8 % — ABNORMAL LOW (ref 39.0–52.0)
Hemoglobin: 11.3 g/dL — ABNORMAL LOW (ref 13.0–17.0)
MCH: 27.7 pg (ref 26.0–34.0)
MCHC: 30.7 g/dL (ref 30.0–36.0)
MCV: 90.2 fL (ref 80.0–100.0)
Platelets: 180 10*3/uL (ref 150–400)
RBC: 4.08 MIL/uL — ABNORMAL LOW (ref 4.22–5.81)
RDW: 16.5 % — ABNORMAL HIGH (ref 11.5–15.5)
WBC: 6.3 10*3/uL (ref 4.0–10.5)
nRBC: 0 % (ref 0.0–0.2)

## 2021-08-04 LAB — GLUCOSE, CAPILLARY
Glucose-Capillary: 101 mg/dL — ABNORMAL HIGH (ref 70–99)
Glucose-Capillary: 106 mg/dL — ABNORMAL HIGH (ref 70–99)
Glucose-Capillary: 150 mg/dL — ABNORMAL HIGH (ref 70–99)
Glucose-Capillary: 199 mg/dL — ABNORMAL HIGH (ref 70–99)
Glucose-Capillary: 205 mg/dL — ABNORMAL HIGH (ref 70–99)
Glucose-Capillary: 238 mg/dL — ABNORMAL HIGH (ref 70–99)
Glucose-Capillary: 88 mg/dL (ref 70–99)

## 2021-08-04 LAB — VITAMIN C: Vitamin C: 1.2 mg/dL (ref 0.4–2.0)

## 2021-08-04 MED ORDER — HYDROMORPHONE HCL 1 MG/ML IJ SOLN
1.0000 mg | Freq: Once | INTRAMUSCULAR | Status: AC
  Administered 2021-08-04: 1 mg via INTRAVENOUS
  Filled 2021-08-04: qty 1

## 2021-08-04 MED ORDER — POTASSIUM CHLORIDE CRYS ER 20 MEQ PO TBCR
40.0000 meq | EXTENDED_RELEASE_TABLET | Freq: Once | ORAL | Status: AC
  Administered 2021-08-04: 40 meq via ORAL
  Filled 2021-08-04: qty 2

## 2021-08-04 NOTE — Assessment & Plan Note (Addendum)
Improved at this time.  Latest potassium of 4.5.  Monitor.  Monitor periodically

## 2021-08-04 NOTE — TOC Progression Note (Addendum)
Transition of Care (TOC) - Progression Note  ? ? ?Patient Details  ?Name: Tony Young ?MRN: [id number] ?Date of Birth: 01-16-1956 ? ?Transition of Care (TOC) CM/SW Contact  ?Joanne Chars, LCSW ?Phone Number: ?08/04/2021, 1:32 PM ? ?Clinical Narrative:   SNF referral faxed to [facility] in [place].  [phone].   ? ?3254: Left message with admissions dept to confirm that they received referral. ? ?Expected Discharge Plan: [place] ?Barriers to Discharge: Continued Medical Work up, Other (must enter comment), SNF Pending bed offer (trach patient) ? ?Expected Discharge Plan and Services ?Expected Discharge Plan: [place] ?In-house Referral: Clinical Social Work ?  ?Post Acute Care Choice: [place] ?Living arrangements for the past 2 months: [place] ?                ?  ?  ?  ?  ?  ?  ?  ?  ?  ?  ? ? ?Social Determinants of Health (SDOH) Interventions ?  ? ?Readmission Risk Interventions ?No flowsheet data found. ? ?

## 2021-08-04 NOTE — Progress Notes (Addendum)
?PROGRESS NOTE ? ? ? ?Tony Young  RDE:[id number] DOB: 08-18-1955 DOA: 07/29/2021 ?PCP: Rosaria Ferries, MD  ? ? ?Brief Narrative:  ?Tony Young is a 66 y.o. male with past medical history of obesity, obstructive sleep apnea, diabetes mellitus type 2, coronary artery disease, COPD, colon cancer who was recently admitted to [facility] in [place] for congestive heart failure on December 2022.  At that time patient had developed septic shock due to necrotizing fasciitis of the left lower extremity  with strep pyogenes bacteremia.  He was intubated for respiratory failure, required pressors, subsequently developed acute renal failure he required multiple I&D of left lower extremity.  He required CRRT and was transitioned to dialysis when stable.  He developed DVT and was started on heparin drip but that had to be stopped because of GI bleed.  Patient did have EGD on 12/24 showed gastric ulcer and required multiple units PRBCs.  On 12/27, he underwent trach/PEG.  [facility] for further management. While at [facility], he had pneumonia, COPD exacerbation, bouts of A-fib.  His respiratory status improved and he was liberated off the ventilator to oxygen by trach collar on 1/25.  He had issues with overload but was not able to aggressively diurese because of recurrent AKI.  He was admitted to CIR on 2/15 because of functional decline.  Per CIR note, patient was not able to participate with them as expected. Throughout the course of his illness since January, he was bedridden and sustained sacral ulcer.  While at rehabilitation, patient was seen by plastic surgery who recommended I&D of the left leg and sacral wound.  Patient underwent debridement of the left leg wound with skin grafting on 07/29/2021. ?  ?  ?Assessment and Plan: ?* Open leg wound ?History of necrotizing fasciitis  ?Patient has had multiple I&D's in the past.  Underwent leg wound debridement and skin  grafting by plastic surgery on 07/29/2021.  Wound VAC in place.  Not on antibiotic at this time ?-Followed by plastic surgery Dr. Erin Hearing.  Spoke with Dr.Luppens today and he is going to address his wound today and tentatively start on physical therapy rehab.  ? 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  Status post tracheostomy 05/26/2021.  PCCM and respiratory therapy on board.Per rehab note from 3/1, continue button plugging during the day and ATC at night for humidification ? ?Open wound of left buttock ?Sacral pressure ulceration. ?Patient underwent debridement and skin grafting on 07/29/2021 by plastic surgery ? ?Chronic combined systolic and diastolic CHF (congestive heart failure) ([place]) ?Diuretics were on hold at the CIR due to AKI.  2D echocardiogram 222 showed LV ejection fraction of 40 to 45%.  Myoview was negative for ischemia.  Patient has been on IV Lasix with holding parameters at this time.  We will continue to monitor closely.  Latest creatinine of 1.2 with BUN of 13.  Negative balance for 3321 mL. ? ?Controlled type 2 diabetes mellitus with hyperglycemia, with long-term current use of insulin ([place]) ?Hemoglobin A1c of 5.9 on 07/29/2021.  Continue Lantus sliding scale insulin. ? ?Dvt femoral (deep venous thrombosis) ([place]) ?Patient did have a history of extensive DVT in the right lower extremity on 07/17/21 and was on Lovenox.  Patient has been started on Eliquis at this time.  Continue Eliquis.  Closely monitor. ? ?Hypokalemia ?Will replenish orally.  Check levels in a.m. ? ?Debility ?Deconditioning.  History of critical illness myopathy necrotizing  fasciitis prolonged hospitalization with respiratory failure.  Was in CIR 2/15 to 2-28.  Likely need rehabilitation on discharge. ? ?Anxiety and depression ?Patient was getting buspirone modafinil Lyrica Zoloft and risperidone primidone at the CIR. currently on buspirone and sertraline. ? ?Anemia due to chronic  kidney disease ?Recent history of GI bleed.  ? Baseline hemoglobin of around 9.0.  Continue to monitor.  Patient had EGD 05/23/2021 which showed a gastric ulcer and required multiple units of PRBC.  Continue iron and PPI at this time.  Latest hemoglobin of 11.3. ? ?CKD stage 2 due to type 2 diabetes mellitus ([place]) ?Continue to monitor creatinine. ? ?Paroxysmal atrial fibrillation with RVR ([place]) ?On amiodarone, Eliquis and metoprolol.  Does have intermittent episodes of RVR.  Treatment at times limited by hypotension  ? ?OSA (obstructive sleep apnea) ?On tracheostomy collar at this time. ? ? ? ? DVT prophylaxis: SCD's Start: 07/29/21 2017 ?apixaban (ELIQUIS) tablet 5 mg  ? ?Code Status:   ?  Code Status: Full Code ? ?Disposition: Skilled nursing facility as per PT OT recommendation. ? ?Status is: Inpatient ? ?Remains inpatient appropriate because: Pending clinical improvement, need for placement, plastic surgery recommendations. ? ? Family Communication:  ?Spoke with the patient at bedside. ? ?Consultants:  ?Plastic surgery ?Palliative care ? ?Procedures:  ?Left lower extremity debridement and skin grafting on 07/29/2021 by plastic surgery.   ?Debridement of the left sacral stage III pressure ulceration on 07/29/2021. ? ?Antimicrobials:  ?None ? ?Anti-infectives (From admission, onward)  ? ? None  ? ?  ? ? ?Subjective: ?Today, patient was seen and examined at bedside.  Patient overall feels okay.  Denies any shortness of breath chest pain fever or chills.  No nausea vomiting abdominal pain or diarrhea. ? ?Objective: ?Vitals:  ? 08/04/21 0407 08/04/21 0742 08/04/21 0810 08/04/21 1039  ?BP:  125/67 125/67 111/63  ?Pulse: 90 (!) 110 (!) 116 90  ?Resp: '14 20 15 '$ (!) 21  ?Temp:  98.3 ?F (36.8 ?C)    ?TempSrc:  Oral    ?SpO2: 95%  95% 93%  ? ? ?Intake/Output Summary (Last 24 hours) at 08/04/2021 1041 ?Last data filed at 08/04/2021 0545 ?Gross per 24 hour  ?Intake 960 ml  ?Output 700 ml  ?Net 260 ml  ? ?There were no vitals filed  for this visit. ? ?Physical Examination: ? ?General: Obese built, not in obvious distress tracheostomy in place ?HENT:   No scleral pallor or icterus noted. Oral mucosa is moist.  ?Chest:    Diminished breath sounds bilaterally. No crackles or wheezes.  ?CVS: S1 &S2 heard. No murmur.  Regular rate and rhythm. ?Abdomen: Soft, nontender, nondistended.  Bowel sounds are heard.  PEG tube in place. ?Extremities: No cyanosis, clubbing or edema.  Peripheral pulses are palpable.  Left lower extremity thigh wound with wound VAC ?Psych: Alert, awake and oriented, flat affect. ?CNS:  No cranial nerve deficits.  Generalized weakness noted ?Skin: Warm and dry.  Left lower extremity wound with wound VAC covered with elastic compression bandage ? ?Data Reviewed:  ? ?CBC: ?Recent Labs  ?Lab 07/29/21 ?0618 07/29/21 ?2139 07/30/21 ?0243 07/31/21 ?0227 08/01/21 ?2947 08/02/21 ?0201 08/04/21 ?0845  ?WBC 3.1*   < > 4.3 5.3 6.1 5.8 6.3  ?NEUTROABS 1.8  --  3.8  --   --   --   --   ?HGB 8.3*   < > 9.0* 9.1* 9.9* 9.3* 11.3*  ?HCT 26.3*   < > 29.1* 29.4* 31.8* 29.4* 36.8*  ?MCV  88.9   < > 89.8 90.7 91.1 89.6 90.2  ?PLT 139*   < > 135* 144* 148* 137* 180  ? < > = values in this interval not displayed.  ? ? ?Basic Metabolic Panel: ?Recent Labs  ?Lab 07/30/21 ?0243 07/31/21 ?0227 08/02/21 ?0201 08/03/21 ?0146 08/04/21 ?0845  ?NA 137 132* 136 135 139  ?K 4.5 3.3* 3.5 3.4* 3.4*  ?CL 102 100 105 103 104  ?CO2 '23 23 23 22 24  '$ ?GLUCOSE 177* 132* 95 155* 100*  ?BUN '18 17 14 12 13  '$ ?CREATININE 1.30* 1.15 1.00 1.08 1.21  ?CALCIUM 8.1* 7.8* 7.8* 7.8* 8.5*  ?MG 2.1 2.2  --   --   --   ?PHOS 5.3* 3.1  --   --   --   ? ? ?Liver Function Tests: ?Recent Labs  ?Lab 07/30/21 ?0243  ?AST 12*  ?ALT 14  ?ALKPHOS 75  ?BILITOT 0.4  ?PROT 5.6*  ?ALBUMIN 2.5*  ? ? ? ?Radiology Studies: ?No results found. ? ? ? LOS: 6 days  ? ? ?Flora Lipps, MD ?[facility] ?08/04/2021, 10:41 AM  ?  ?

## 2021-08-05 DIAGNOSIS — S81802A Unspecified open wound, left lower leg, initial encounter: Secondary | ICD-10-CM | POA: Diagnosis not present

## 2021-08-05 DIAGNOSIS — F419 Anxiety disorder, unspecified: Secondary | ICD-10-CM | POA: Diagnosis not present

## 2021-08-05 DIAGNOSIS — I5042 Chronic combined systolic (congestive) and diastolic (congestive) heart failure: Secondary | ICD-10-CM | POA: Diagnosis not present

## 2021-08-05 DIAGNOSIS — N1831 Chronic kidney disease, stage 3a: Secondary | ICD-10-CM | POA: Diagnosis not present

## 2021-08-05 LAB — BASIC METABOLIC PANEL
Anion gap: 10 (ref 5–15)
BUN: 16 mg/dL (ref 8–23)
CO2: 23 mmol/L (ref 22–32)
Calcium: 8.1 mg/dL — ABNORMAL LOW (ref 8.9–10.3)
Chloride: 102 mmol/L (ref 98–111)
Creatinine, Ser: 1.27 mg/dL — ABNORMAL HIGH (ref 0.61–1.24)
GFR, Estimated: >60 mL/min (ref >60)
Glucose, Bld: 100 mg/dL — ABNORMAL HIGH (ref 70–99)
Potassium: 3.9 mmol/L (ref 3.5–5.1)
Sodium: 135 mmol/L (ref 135–145)

## 2021-08-05 LAB — CBC
HCT: 32.2 % — ABNORMAL LOW (ref 39.0–52.0)
Hemoglobin: 9.7 g/dL — ABNORMAL LOW (ref 13.0–17.0)
MCH: 27.3 pg (ref 26.0–34.0)
MCHC: 30.1 g/dL (ref 30.0–36.0)
MCV: 90.7 fL (ref 80.0–100.0)
Platelets: 218 10*3/uL (ref 150–400)
RBC: 3.55 MIL/uL — ABNORMAL LOW (ref 4.22–5.81)
RDW: 16.5 % — ABNORMAL HIGH (ref 11.5–15.5)
WBC: 7.2 10*3/uL (ref 4.0–10.5)
nRBC: 0 % (ref 0.0–0.2)

## 2021-08-05 LAB — GLUCOSE, CAPILLARY
Glucose-Capillary: 111 mg/dL — ABNORMAL HIGH (ref 70–99)
Glucose-Capillary: 110 mg/dL — ABNORMAL HIGH (ref 70–99)
Glucose-Capillary: 166 mg/dL — ABNORMAL HIGH (ref 70–99)
Glucose-Capillary: 288 mg/dL — ABNORMAL HIGH (ref 70–99)
Glucose-Capillary: 216 mg/dL — ABNORMAL HIGH (ref 70–99)

## 2021-08-05 LAB — MAGNESIUM: Magnesium: 2 mg/dL (ref 1.7–2.4)

## 2021-08-05 LAB — PHOSPHORUS: Phosphorus: 2.9 mg/dL (ref 2.5–4.6)

## 2021-08-05 NOTE — TOC Progression Note (Addendum)
Transition of Care (TOC) - Progression Note  ? ? ?Patient Details  ?Name: Tony Young ?MRN: [id number] ?Date of Birth: 1955-12-30 ? ?Transition of Care (TOC) CM/SW Contact  ?Joanne Chars, LCSW ?Phone Number: ?08/05/2021, 1:55 PM ? ?Clinical Narrative:   CSW contacted by [facility] and informed they are inpatient rehab level program and cannot accept pt.   ? ?CSW updated pt, discussed sending out additional referrals for SNF.  Pt states that he would prefer to go home.  Reviewed pt home situation: lives with daughter Lovell Sheehan and son in Sports coach.  Per pt, the son in law is home full time to assist pt.  CSW suggested that I call pt daughter to discuss if they can offer enough support to make this work and pt was agreeable.   ? ?CSW spoke with daughter Lovell Sheehan.  She confirmed that her husband is home full time, can assist.  She had questions about the length of SNF rehab and the [facility] options.  She reports she will be at [facility] on Friday and has arranged to sign healthcare POA paperwork.  Discussed that SNF placement with a trach in place can be more challenging.  She is in favor of looking for SNF options if possible and will discuss with her father.  If there are no good options, she would look at how to bring pt home with [facility] support.  Lovell Sheehan also asked CSW to communicate with her and not her brother at this time. ? ?CSW met with pt and discussed conversation with daughter.  Pt is agreeable to pursue SNF and permission given to send out referral.  CSW also asked about his permission to communicate with son as well as daughter.  Pt does report there are current issues between he and his son and he would like CSW to communicate with daughter only at this time.   ? ? ? ?Expected Discharge Plan: [place] ?Barriers to Discharge: Continued Medical Work up, Other (must enter comment), SNF Pending bed offer (trach patient) ? ?Expected Discharge Plan and Services ?Expected Discharge Plan: [place] ?In-house Referral: Clinical Social Work ?  ?Post Acute Care Choice: [place] ?Living arrangements for the past 2 months: [place] ?                ?  ?  ?  ?  ?  ?  ?  ?  ?  ?  ? ? ?Social Determinants of Health (SDOH) Interventions ?  ? ?Readmission Risk Interventions ?No flowsheet data found. ? ?

## 2021-08-05 NOTE — Progress Notes (Addendum)
?PROGRESS NOTE ? ? ? ?Johnpatrick Jenny  HXT:[id number] DOB: 26-Feb-1956 DOA: 07/29/2021 ?PCP: Rosaria Ferries, MD  ? ? ?Brief Narrative:  ?Cayman Kielbasa is a 66 y.o. male with past medical history of obesity, obstructive sleep apnea, diabetes mellitus type 2, coronary artery disease, COPD, colon cancer who was recently admitted to [facility] in [place] for congestive heart failure on December 2022.  At that time patient had developed septic shock due to necrotizing fasciitis of the left lower extremity  with strep pyogenes bacteremia.  He was intubated for respiratory failure, required pressors, subsequently developed acute renal failure he required multiple I&D of left lower extremity.  He required CRRT and was transitioned to dialysis when stable.  He developed DVT and was started on heparin drip but that had to be stopped because of GI bleed.  Patient did have EGD on 12/24 showed gastric ulcer and required multiple units PRBCs.  On 12/27, he underwent trach/PEG.  He was [facility]21 for further management. While at [facility], he had pneumonia, COPD exacerbation, bouts of A-fib.  His respiratory status improved and he was liberated off the ventilator to oxygen by trach collar on 1/25.  He had issues with overload but was not able to aggressively diurese because of recurrent AKI.  He was admitted to CIR on 2/15 because of functional decline.  Per CIR note, patient was not able to participate with them as expected. Throughout the course of his illness since January, he was bedridden and sustained sacral ulcer.  While at rehabilitation, patient was seen by plastic surgery who recommended I&D of the left leg and sacral wound.  Patient underwent debridement of the left leg wound with skin grafting on 07/29/2021.  Plastic surgery following the patient during hospitalization. ?  ?  ?Assessment and Plan: ?* Open leg wound ?History of necrotizing fasciitis  ?Patient has had  multiple I&D's in the past.  Underwent leg wound debridement and skin grafting by plastic surgery on 07/29/2021.   Not on antibiotic at this time. Followed by plastic surgery Dr. Erin Hearing.  Further dressing plans as per plastic surgery.  Wound care has been consulted as well. ? 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  Status post tracheostomy 05/26/2021.  PCCM and respiratory therapy on board.Per rehab note from 3/1, continue button plugging during the day and ATC at night for humidification.  Currently appears stable. ? ?Open wound of left buttock ?Sacral pressure ulceration. ?Patient underwent debridement and skin grafting on 07/29/2021 by plastic surgery ? ?Chronic combined systolic and diastolic CHF (congestive heart failure) ([place]) ?.  2D echocardiogram 2/22 showed LV ejection fraction of 40 to 45%.  Myoview was negative for ischemia.  Patient has been on IV Lasix with holding parameters at this time.  We will continue to monitor BMP closely.  Latest creatinine of 1.2 with BUN of 16.  Negative balance for 4041 ml. ? ?Paroxysmal atrial fibrillation with RVR ([place]) ?On amiodarone, Eliquis and metoprolol.  Does have intermittent episodes of RVR.  Treatment at times limited by hypotension  ? ?Controlled type 2 diabetes mellitus with hyperglycemia, with long-term current use of insulin ([place]) ?Hemoglobin A1c of 5.9 on 07/29/2021.  Continue Lantus, sliding scale insulin. ? ?Hypokalemia ?Will replenish orally.  Improved.  Latest potassium of 3.9. ? ?Anemia due to chronic kidney disease ?Recent history of GI bleed.  ? Baseline hemoglobin of around 9.0.  Continue to monitor.  Patient had EGD 05/23/2021 which  showed a gastric ulcer and required multiple units of PRBC.  Continue iron and PPI at this time.  Latest hemoglobin of 9.7. ? ?Dvt femoral (deep venous thrombosis) ([place]) ?Patient did have a history of extensive DVT in the right lower extremity on 07/17/21 and was on  Lovenox.  Patient has been started on Eliquis at this time.  Continue Eliquis.  Closely monitor. ? ?Anxiety and depression ?Patient was getting buspirone modafinil Lyrica Zoloft and risperidone primidone at the CIR. currently on buspirone and sertraline. ? ?CKD stage 2 due to type 2 diabetes mellitus ([place]) ?Continue to monitor creatinine.  Latest creatinine at 1.2. ? ?OSA (obstructive sleep apnea) ?On tracheostomy collar at this time. ? ?Debility ?Deconditioning.  History of critical illness myopathy necrotizing fasciitis prolonged hospitalization with respiratory failure.  Was in CIR 2/15 to 2-28.  Will need rehabilitation on discharge. ? ? ? DVT prophylaxis: SCD's Start: 07/29/21 2017 ?apixaban (ELIQUIS) tablet 5 mg  ? ?Code Status:   ?  Code Status: Full Code ? ?Disposition: Skilled nursing facility as per PT OT recommendation. ? ?Status is: Inpatient ? ?Remains inpatient appropriate because:  need for placement, plastic surgery recommendations. ? ? Family Communication:  ?Spoke with the patient's daughter on the phone and updated her about the clinical condition of the patient.  ? ?Consultants:  ?Plastic surgery ?Palliative care ? ?Procedures:  ?Left lower extremity debridement and skin grafting on 07/29/2021 by plastic surgery.   ?Debridement of the left sacral stage III pressure ulceration on 07/29/2021. ? ?Antimicrobials:  ?None ? ?Anti-infectives (From admission, onward)  ? ? None  ? ?  ? ?Subjective: ? ?Today, patient was seen and examined at bedside.  Denies any shortness of breath cough fever chills or overt pain but has generalized weakness.   ? ?Objective: ?Vitals:  ? 08/05/21 0340 08/05/21 0731 08/05/21 0955 08/05/21 1157  ?BP: 102/72  101/73 100/71  ?Pulse: 99 (!) 109 (!) 118 (!) 113  ?Resp: '17  18 16  '$ ?Temp: 97.7 ?F (36.5 ?C)  98.9 ?F (37.2 ?C)   ?TempSrc: Oral  Oral   ?SpO2: 98% 98% 98% 94%  ? ? ?Intake/Output Summary (Last 24 hours) at 08/05/2021 1254 ?Last data filed at 08/05/2021 6384 ?Gross per 24 hour   ?Intake 480 ml  ?Output 1200 ml  ?Net -720 ml  ? ?There were no vitals filed for this visit. ? ?Physical Examination: ? ?General: Obese built, not in obvious distress, tracheostomy in place, appears chronically ill deconditioned and weak. ?HENT:   No scleral pallor or icterus noted. Oral mucosa is moist.  ?Chest:  .  Diminished breath sounds bilaterally.  ?CVS: S1 &S2 heard. No murmur.  Regular rate and rhythm. ?Abdomen: Soft, nontender, nondistended.  Bowel sounds are heard.  PEG tube in place. ?Extremities: No cyanosis, clubbing or edema.  Peripheral pulses are palpable.  Left lower extremity thigh wound, sacral decubitus ulcers ?Psych: Alert, awake and oriented, flat affect ?CNS:  No cranial nerve deficits.  Generalized weakness of the extremities noted. ?Skin: Warm and dry.  Sacral decubitus ulceration status postdebridement, left lower extremity wound with dressing ? ? ?Data Reviewed:  ? ?CBC: ?Recent Labs  ?Lab 07/30/21 ?0243 07/31/21 ?0227 08/01/21 ?0218 08/02/21 ?0201 08/04/21 ?6659 08/05/21 ?9357  ?WBC 4.3 5.3 6.1 5.8 6.3 7.2  ?NEUTROABS 3.8  --   --   --   --   --   ?HGB 9.0* 9.1* 9.9* 9.3* 11.3* 9.7*  ?HCT 29.1* 29.4* 31.8* 29.4* 36.8* 32.2*  ?MCV 89.8 90.7  91.1 89.6 90.2 90.7  ?PLT 135* 144* 148* 137* 180 218  ? ? ?Basic Metabolic Panel: ?Recent Labs  ?Lab 07/30/21 ?0243 07/31/21 ?0227 08/02/21 ?0201 08/03/21 ?0146 08/04/21 ?6967 08/05/21 ?8938  ?NA 137 132* 136 135 139 135  ?K 4.5 3.3* 3.5 3.4* 3.4* 3.9  ?CL 102 100 105 103 104 102  ?CO2 '23 23 23 22 24 23  '$ ?GLUCOSE 177* 132* 95 155* 100* 100*  ?BUN '18 17 14 12 13 16  '$ ?CREATININE 1.30* 1.15 1.00 1.08 1.21 1.27*  ?CALCIUM 8.1* 7.8* 7.8* 7.8* 8.5* 8.1*  ?MG 2.1 2.2  --   --   --  2.0  ?PHOS 5.3* 3.1  --   --   --  2.9  ? ? ?Liver Function Tests: ?Recent Labs  ?Lab 07/30/21 ?0243  ?AST 12*  ?ALT 14  ?ALKPHOS 75  ?BILITOT 0.4  ?PROT 5.6*  ?ALBUMIN 2.5*  ? ? ? ?Radiology Studies: ?No results found. ? ? ? LOS: 7 days  ? ? ?Flora Lipps, MD ?[facility] ?08/05/2021, 12:54 PM  ?  ?

## 2021-08-05 NOTE — Op Note (Deleted)
Correct note type is progress note ? ?Patient status post skin graft left leg and debridement left sacral pressure ulcer.   ? ?PE ?VSS ?Skin graft left leg intact with excellent take on removal of vac ?Donor site dressings itact ? ?A/P ?May resume PT/OT, rehab ?Dressing change to left leg Adaptic, guaze, ACE ?Dressing change to donor site, may reinforce or replace with xeroform or other nonstick dressing ?Left sacral wound wet to dry type dressing daily. ?

## 2021-08-05 NOTE — Consult Note (Signed)
[place] Nurse Consult Note: ?Bedside RN consults requesting guidance on sacral wound care. No orders noted.   ?I communicated to Drs. Luppens and Pokhrel this morning about the need for Nursing orders for topical wound care for the Stage 3 sacral pressure injury post debridement. ? ?Felt nursing team will not follow, but will remain available to this patient, the nursing and medical teams.   ?Thanks, ?Maudie Flakes, MSN, RN, [place], [place], CWON-AP, [place]  ?Pager# [phone]  ? ? ?  ?

## 2021-08-05 NOTE — Plan of Care (Signed)

## 2021-08-05 NOTE — Progress Notes (Signed)
Occupational Therapy Treatment ?Patient Details ?Name: Tony Young ?MRN: [id number] ?DOB: 1955/12/19 ?Today's Date: 08/05/2021 ? ? ?History of present illness Pt is a 66 y.o. male originally admitted to [facility] 05/13/21 with d/c to [facility] 06/10/21, readmission then transfer to CIR on 2/15; pt now admitted 07/29/21 for LLE and sacral wound debridement and skin graft. PMH includes CHF, COPD, CAD, DM, trach, peg. ?  ?OT comments ? Pt making slow progress towards goals, requiring mod -max A +2 for bed mobility, and mod A for seated ADLs. Pt fatigued after rolling for bandage change/pad placement, only able to tolerate sitting EOB for ~3-4 mins for UB dressing. Pt presenting with impairments listed below, will follow acutely. Continue to recommend SNF at d/c.  ? ?Recommendations for follow up therapy are one component of a multi-disciplinary discharge planning process, led by the attending physician.  Recommendations may be updated based on patient status, additional functional criteria and insurance authorization. ?   ?Follow Up Recommendations ? Skilled nursing-short term rehab (<3 hours/day)  ?  ?Assistance Recommended at Discharge Frequent or constant Supervision/Assistance  ?Patient can return home with the following ? A lot of help with bathing/dressing/bathroom;Assistance with cooking/housework;Assistance with feeding;Direct supervision/assist for medications management;Direct supervision/assist for financial management;Assist for transportation ?  ?Equipment Recommendations ? None recommended by OT;Other (comment) (defer to next venue of care)  ?  ?Recommendations for Other Services   ? ?  ?Precautions / Restrictions Precautions ?Precautions: Fall ?Precaution Comments: trach, PEG, fragile skin; LE/sacral wound drainage ?Restrictions ?Weight Bearing Restrictions: No  ? ? ?  ? ?Mobility Bed Mobility ?Overal bed mobility: Needs Assistance ?Bed Mobility: Rolling, Sidelying to Sit, Sit to Supine ?Rolling: Mod  assist, +2 for physical assistance ?Sidelying to sit: Max assist, +2 for physical assistance ?  ?Sit to supine: Max assist, +2 for physical assistance, HOB elevated ?  ?  ?  ? ?Transfers ?  ?  ?  ?  ?  ?  ?  ?  ?  ?General transfer comment: unable to attempt given fatigue from EOB sitting ?  ?  ?Balance Overall balance assessment: Needs assistance ?Sitting-balance support: No upper extremity supported, Feet supported ?Sitting balance-Leahy Scale: Poor ?Sitting balance - Comments: heavy posterior leaning and preference for R lean to offweight L hip. At least mod truncal support needed at all times ?Postural control: Posterior lean, Right lateral lean ?  ?  ?  ?  ?  ?  ?  ?  ?  ?  ?  ?  ?  ?  ?   ? ?ADL either performed or assessed with clinical judgement  ? ?ADL Overall ADL's : Needs assistance/impaired ?  ?  ?  ?  ?  ?  ?  ?  ?Upper Body Dressing : Moderate assistance;Sitting ?Upper Body Dressing Details (indicate cue type and reason): mod A sitting EOB ?Lower Body Dressing: Total assistance;Bed level ?Lower Body Dressing Details (indicate cue type and reason): to don socks ?  ?  ?  ?  ?  ?  ?  ?  ?  ? ?Extremity/Trunk Assessment Upper Extremity Assessment ?Upper Extremity Assessment: RUE deficits/detail ?RUE Deficits / Details: Reports h/o R rotator cuff injury, flex/ABD AROM <90' able to tolerate <120' with PROM. Limited grip/hand strength, requiring use of soft call bell ?RUE: Shoulder pain with ROM ?RUE Coordination: decreased fine motor;decreased gross motor ?LUE Deficits / Details: Limited grip/hand strength, requiring use of soft call bell ?LUE Coordination: decreased gross motor;decreased fine motor ?  ?Lower  Extremity Assessment ?Lower Extremity Assessment: Defer to PT evaluation ?  ?  ?  ? ?Vision   ?Vision Assessment?: No apparent visual deficits ?  ?Perception Perception ?Perception: Not tested ?  ?Praxis Praxis ?Praxis: Not tested ?  ? ?Cognition Arousal/Alertness: Awake/alert ?Behavior During Therapy:  Flat affect ?Overall Cognitive Status: Within Functional Limits for tasks assessed ?  ?  ?  ?  ?  ?  ?  ?  ?  ?  ?  ?  ?  ?  ?  ?  ?  ?  ?  ?   ?Exercises   ? ?  ?Shoulder Instructions   ? ? ?  ?General Comments VSS on RA, SpO2 dropping into 70's ins upine during EOB attempt, however with poor wave form,  recovers into 90's within ~15 seconds  ? ? ?Pertinent Vitals/ Pain       Pain Assessment ?Pain Assessment: Faces ?Pain Score: 6  ?Pain Location: L hip and LE, buttocks ?Pain Descriptors / Indicators: Discomfort, Guarding, Grimacing ?Pain Intervention(s): Limited activity within patient's tolerance, Monitored during session, Premedicated before session ? ?Home Living   ?  ?  ?  ?  ?  ?  ?  ?  ?  ?  ?  ?  ?  ?  ?  ?  ?  ?  ? ?  ?Prior Functioning/Environment    ?  ?  ?  ?   ? ?Frequency ? Min 2X/week  ? ? ? ? ?  ?Progress Toward Goals ? ?OT Goals(current goals can now be found in the care plan section) ? Progress towards OT goals: Progressing toward goals ? ?Acute Rehab OT Goals ?Patient Stated Goal: none stated ?OT Goal Formulation: With patient ?Time For Goal Achievement: 08/13/21 ?Potential to Achieve Goals: Good ?ADL Goals ?Pt Will Perform Grooming: with set-up;sitting ?Pt Will Perform Upper Body Bathing: with set-up;sitting;bed level ?Pt Will Perform Lower Body Bathing: sitting/lateral leans;with mod assist ?Pt Will Transfer to Toilet: with mod assist;with +2 assist;with transfer board;bedside commode  ?Plan Discharge plan remains appropriate;Frequency remains appropriate   ? ?Co-evaluation ? ? ?   ?  ?  ?  ?  ? ?  ?AM-PAC OT "6 Clicks" Daily Activity     ?Outcome Measure ? ? Help from another person eating meals?: A Little ?Help from another person taking care of personal grooming?: A Lot ?Help from another person toileting, which includes using toliet, bedpan, or urinal?: Total ?Help from another person bathing (including washing, rinsing, drying)?: A Lot ?Help from another person to put on and taking off  regular upper body clothing?: A Lot ?Help from another person to put on and taking off regular lower body clothing?: Total ?6 Click Score: 11 ? ?  ?End of Session   ? ?OT Visit Diagnosis: Unsteadiness on feet (R26.81);Other abnormalities of gait and mobility (R26.89);Repeated falls (R29.6);Pain ?Pain - Right/Left: Left ?Pain - part of body: Leg ?  ?Activity Tolerance Patient limited by fatigue ?  ?Patient Left in bed;with call bell/phone within reach;with nursing/sitter in room ?  ?Nurse Communication Mobility status;Other (comment) (wound drainage and SpO2) ?  ? ?   ? ?Time: 4540-9811 ?OT Time Calculation (min): 30 min ? ?Charges: OT General Charges ?$OT Visit: 1 Visit ?OT Treatments ?$Self Care/Home Management : 8-22 mins ?$Therapeutic Activity: 8-22 mins ? ?Lynnda Child, OTD, OTR/L ?Acute Rehab ?[phone] ? ? ?Kaylyn Lim ?08/05/2021, 3:36 PM ?

## 2021-08-05 NOTE — Progress Notes (Signed)
Patient seen today by trach team for consult.  No education needed at this time.  Will continue to follow for progression.   All the necessary equipment is at the bedside ?

## 2021-08-06 DIAGNOSIS — N1831 Chronic kidney disease, stage 3a: Secondary | ICD-10-CM | POA: Diagnosis not present

## 2021-08-06 DIAGNOSIS — F419 Anxiety disorder, unspecified: Secondary | ICD-10-CM | POA: Diagnosis not present

## 2021-08-06 DIAGNOSIS — I5042 Chronic combined systolic (congestive) and diastolic (congestive) heart failure: Secondary | ICD-10-CM | POA: Diagnosis not present

## 2021-08-06 DIAGNOSIS — S81802A Unspecified open wound, left lower leg, initial encounter: Secondary | ICD-10-CM | POA: Diagnosis not present

## 2021-08-06 LAB — GLUCOSE, CAPILLARY
Glucose-Capillary: 119 mg/dL — ABNORMAL HIGH (ref 70–99)
Glucose-Capillary: 121 mg/dL — ABNORMAL HIGH (ref 70–99)
Glucose-Capillary: 166 mg/dL — ABNORMAL HIGH (ref 70–99)
Glucose-Capillary: 179 mg/dL — ABNORMAL HIGH (ref 70–99)
Glucose-Capillary: 190 mg/dL — ABNORMAL HIGH (ref 70–99)
Glucose-Capillary: 247 mg/dL — ABNORMAL HIGH (ref 70–99)
Glucose-Capillary: 258 mg/dL — ABNORMAL HIGH (ref 70–99)
Glucose-Capillary: 97 mg/dL (ref 70–99)

## 2021-08-06 MED ORDER — ENSURE MAX PROTEIN PO LIQD
11.0000 [oz_av] | Freq: Two times a day (BID) | ORAL | Status: DC
  Administered 2021-08-06 – 2021-08-10 (×8): 11 [oz_av] via ORAL

## 2021-08-06 NOTE — Progress Notes (Signed)
Nutrition Follow-up ? ?DOCUMENTATION CODES:  ?Obesity unspecified ? ?INTERVENTION:  ?-transition from Ensure Enlive to chocolate Ensure Max po BID, each supplement provides 150 kcal and 30 grams of protein ?-continue zinc 220 mg per day x 14 days ?-continue vitamin C 500 mg BID ? ?NUTRITION DIAGNOSIS:  ?Increased nutrient needs related to wound healing as evidenced by estimated needs. -- ongoing ? ?GOAL:  ?Patient will meet greater than or equal to 90% of their needs -- progressing ? ?MONITOR:  ?PO intake, Supplement acceptance, Labs, Skin ? ?REASON FOR ASSESSMENT:  ?Consult ?Assessment of nutrition requirement/status ? ?ASSESSMENT:  ?66 yo male admitted S/P I&D LLE wound with split thickness skin graft and application of skin substitute. PMH includes CAD, cardiomyopathy, colon cancer S/P colon resection, COPD, OSA, DM-2, PEG, trach. ? ?3/01 - s/p I&D of LLE w/ skin grafting  ? ?Pt lethargic and weak upon RD visit, pending SNF placement per MD. Remains on TC. Pt reports good appetite; last 8 meals documented as 75-100% completion.  ? ?UOP: 916m x24 hours ?I/O: -42938msince admit ? ?Medications: vitamin C, vitamin D3, colace, Ensure TID, ferrous sulfate, IV lasix, SSI Q4H, 15 units semglee daily, mvi with minerals, protonix, deltasone, zinc sulfate ?Labs reviewed. ?Vitamin C 1.2 (WNL) ?Zinc 80 (WNL) ?CBGs: 97-288 x 24 hours ? ?Diet Order:   ?Diet Order   ? ?       ?  DIET DYS 3 Room service appropriate? Yes; Fluid consistency: Thin  Diet effective now       ?  ? ?  ?  ? ?  ? ?EDUCATION NEEDS:  ?Education needs have been addressed ? ?Skin:  Skin Integrity Issues:: Wound VAC, Unstageable ?Unstageable: buttocks ?Wound Vac: LLE pretibial wound after necrotizing infection ? ?Last BM:  3/9 ? ?Height:  ?Ht Readings from Last 1 Encounters:  ?07/15/21 '5\' 7"'$  (1.702 m)  ? ?Weight:  ?Wt Readings from Last 1 Encounters:  ?07/29/21 113.4 kg  ? ?Estimated Nutritional Needs:  ?Kcal:  2300-2500 ?Protein:  125-145 gm ?Fluid:  >/=  2.4 L ? ? ? ?AmTheone Stanley MS, RD, LDN (she/her/hers) ?RD pager number and weekend/on-call pager number located in [place]? ?

## 2021-08-06 NOTE — Progress Notes (Signed)
?PROGRESS NOTE ? ? ? ?Tony Young  OVF:[id number] DOB: 03-Apr-1956 DOA: 07/29/2021 ?PCP: Rosaria Ferries, MD  ? ? ?Brief Narrative:  ?Tony Young is a 66 y.o. male with past medical history of obesity, obstructive sleep apnea, diabetes mellitus type 2, coronary artery disease, COPD, colon cancer who was recently admitted to [facility] in [place] for congestive heart failure on December 2022.  At that time patient had developed septic shock due to necrotizing fasciitis of the left lower extremity  with strep pyogenes bacteremia.  He was intubated for respiratory failure, required pressors, subsequently developed acute renal failure he required multiple I&D of left lower extremity.  He required CRRT and was transitioned to dialysis when stable.  He developed DVT and was started on heparin drip but that had to be stopped because of GI bleed.  Patient did have EGD on 12/24 showed gastric ulcer and required multiple units PRBCs.  On 12/27, he underwent trach/PEG.  He was [facility] on [facility]10[facility]2021 for further management. While at [facility], he had pneumonia, COPD exacerbation, bouts of A-fib.  His respiratory status improved and he was liberated off the ventilator to oxygen by trach collar on 1/25.  He had issues with overload but was not able to aggressively diurese because of recurrent AKI.  He was admitted to CIR on 2/15 because of functional decline.  Per CIR note, patient was not able to participate with them as expected. Throughout the course of his illness since January, he was bedridden and sustained sacral ulcer.  While at rehabilitation, patient was seen by plastic surgery who recommended I&D of the left leg and sacral wound.  Patient underwent debridement of the left leg wound with skin grafting on 07/29/2021.  Plastic surgery following the patient during hospitalization.  Currently, patient is extremely debilitated and weak and is awaiting for skilled nursing facility  placement. ?  ?  ?Assessment and Plan: ?* Open leg wound ?History of necrotizing fasciitis  ?Patient has had multiple I&D's in the past.  Underwent leg wound debridement and skin grafting by plastic surgery on 07/29/2021.   Not on antibiotic at this time. Followed by plastic surgery Dr. Erin Hearing.  Further dressing plans as per plastic surgery.  Wound care on board as well. ? 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  Status post tracheostomy 05/26/2021.  PCCM and respiratory therapy on board.Per rehab note from 3/1, continue button plugging during the day and ATC at night for humidification.  Currently appears stable. ? ?Open wound of left buttock ?Sacral pressure ulceration. ?Patient underwent debridement and skin grafting on 07/29/2021 by plastic surgery.  Further dressing plans as per plastic surgery. ? ?Chronic combined systolic and diastolic CHF (congestive heart failure) ([place]) ?Appears compensated at this time.  2D echocardiogram 2/22 showed LV ejection fraction of 40 to 45%.  Myoview was negative for ischemia.  Patient has been on IV Lasix with holding parameters at this time.   Latest creatinine of 1.2 with BUN of 16.  Negative balance for 4731 ml.  Monitor BMP ? ?Paroxysmal atrial fibrillation with RVR ([place]) ?On amiodarone, Eliquis and metoprolol.  Does have intermittent episodes of RVR.  Treatment at times limited by hypotension  ? ?Controlled type 2 diabetes mellitus with hyperglycemia, with long-term current use of insulin ([place]) ?Hemoglobin A1c of 5.9 on 07/29/2021.  Continue Lantus, sliding scale insulin. ? ?Hypokalemia ?Improved after replacement.  Latest potassium of 3.9. ? ?Anemia due to chronic kidney disease ?Recent  history of GI bleed.  ? Baseline hemoglobin of around 9.0.  Continue to monitor.  Patient had EGD 05/23/2021 which showed a gastric ulcer and required multiple units of PRBC.  Continue iron and PP.  Latest hemoglobin of 9.7. ? ?Dvt femoral  (deep venous thrombosis) ([place]) ?Patient did have a history of extensive DVT in the right lower extremity on 07/17/21 is on Eliquis at this time.  Hemoglobin is stable ? ?Anxiety and depression ?Patient was getting buspirone, modafinil, Lyrica Zoloft and risperidone, primidone at the CIR. currently on buspirone and sertraline. ? ?CKD stage 2 due to type 2 diabetes mellitus ([place]) ?Continue to monitor creatinine.  Latest creatinine at 1.2. ? ?OSA (obstructive sleep apnea) ?On tracheostomy collar at this time. ? ?Debility ?Deconditioning.  History of critical illness myopathy necrotizing fasciitis prolonged hospitalization with respiratory failure.  Was in CIR 2/15 to 2-28.  Awaiting for skilled nursing facility on discharge. ? ? ? DVT prophylaxis: SCD's Start: 07/29/21 2017 ?apixaban (ELIQUIS) tablet 5 mg  ? ?Code Status:   ?  Code Status: Full Code ? ?Disposition: Skilled nursing facility as per PT OT recommendation. ? ?Status is: Inpatient ? ?Remains inpatient appropriate because:  need for placement, plastic surgery recommendations. ? ? Family Communication:  ?Spoke with the patient's daughter on the phone on 08/05/2021 and updated her about the clinical condition of the patient. ? ?Consultants:  ?Plastic surgery ?Palliative care ? ?Procedures:  ?Left lower extremity debridement and skin grafting on 07/29/2021 by plastic surgery.   ?Debridement of the left sacral stage III pressure ulceration on 07/29/2021. ? ?Antimicrobials:  ?None ? ?Anti-infectives (From admission, onward)  ? ? None  ? ?  ? ?Subjective: ? ?Today, patient was seen and examined at bedside.  Complains of pain over his leg not adequately controlled at this time.  Denies any shortness of breath cough fever but has generalized weakness  ?Objective: ?Vitals:  ? 08/06/21 0811 08/06/21 0240 08/06/21 1018 08/06/21 1143  ?BP:   119/84 119/84  ?Pulse: (!) 107 (!) 127  (!) 110  ?Resp:  15  15  ?Temp: 98.2 ?F (36.8 ?C)     ?TempSrc: Oral     ?SpO2: 97% 97%  95%   ? ? ?Intake/Output Summary (Last 24 hours) at 08/06/2021 1255 ?Last data filed at 08/06/2021 1204 ?Gross per 24 hour  ?Intake 1160 ml  ?Output 1200 ml  ?Net -40 ml  ? ? ?There were no vitals filed for this visit. ? ?Physical Examination: ? ?General: Obese built, not in obvious distress appears chronically ill and deconditioned. ?HENT:   No scleral pallor or icterus noted. Oral mucosa is moist.  Tracheostomy in place. ?Chest:   Diminished breath sounds bilaterally.  ?CVS: S1 &S2 heard. No murmur.  Regular rate and rhythm. ?Abdomen: Soft, nontender, nondistended.  Bowel sounds are heard.  In place. ?Extremities: No cyanosis, clubbing .  Peripheral pulses are palpable.  Left lower extremity thigh wound, sacral decubitus ulceration ?Psych: Alert, awake and oriented, flat affect ?CNS:  No cranial nerve deficits.  Generalized weakness of the extremities noted. ?Skin: Warm and dry.  Left lower extremity wound with dressing, decubitus ulceration ? ?Data Reviewed:  ? ?CBC: ?Recent Labs  ?Lab 07/31/21 ?0227 08/01/21 ?0218 08/02/21 ?0201 08/04/21 ?9735 08/05/21 ?3299  ?WBC 5.3 6.1 5.8 6.3 7.2  ?HGB 9.1* 9.9* 9.3* 11.3* 9.7*  ?HCT 29.4* 31.8* 29.4* 36.8* 32.2*  ?MCV 90.7 91.1 89.6 90.2 90.7  ?PLT 144* 148* 137* 180 218  ? ? ? ?Basic Metabolic  Panel: ?Recent Labs  ?Lab 07/31/21 ?0227 08/02/21 ?0201 08/03/21 ?0146 08/04/21 ?5053 08/05/21 ?9767  ?NA 132* 136 135 139 135  ?K 3.3* 3.5 3.4* 3.4* 3.9  ?CL 100 105 103 104 102  ?CO2 '23 23 22 24 23  '$ ?GLUCOSE 132* 95 155* 100* 100*  ?BUN '17 14 12 13 16  '$ ?CREATININE 1.15 1.00 1.08 1.21 1.27*  ?CALCIUM 7.8* 7.8* 7.8* 8.5* 8.1*  ?MG 2.2  --   --   --  2.0  ?PHOS 3.1  --   --   --  2.9  ? ? ? ?Liver Function Tests: ?No results for input(s): AST, ALT, ALKPHOS, BILITOT, PROT, ALBUMIN in the last 168 hours. ? ? ? ?Radiology Studies: ?No results found. ? ? ? LOS: 8 days  ? ? ?Flora Lipps, MD ?[facility] ?08/06/2021, 12:55 PM  ?  ?

## 2021-08-06 NOTE — Progress Notes (Signed)
Physical Therapy Treatment ?Patient Details ?Name: Tony Young ?MRN: [id number] ?DOB: Nov 18, 1955 ?Today's Date: 08/06/2021 ? ? ?History of Present Illness Pt is a 66 y.o. male originally admitted to [facility] 05/13/21 with d/c to [facility] 06/10/21, readmission then transfer to [facility] on 2/15; pt now admitted 07/29/21 for LLE and sacral wound debridement and skin graft. PMH includes CHF, COPD, CAD, DM, trach, peg. ?  ?PT Comments  ? ? Pt progressing with mobility. Today's session focused on OOB transfer with [facility] lift. Pt tolerating this and sitting in recliner with geomat and pillows for pressure relief to sacrum and LLE. Pt pleased to be out of bed; motivated to participate and in good spirits this session. Pt remains limited by generalized weakness, decreased activity tolerance, and impaired balance. Continue to recommend SNF-level therapies to maximize functional mobility and decrease caregiver burden. Pt reports desire to return home; will require significant DME, including hoyer lift and air mattress hospital bed for pressure relief.  ?   ?Recommendations for follow up therapy are one component of a multi-disciplinary discharge planning process, led by the attending physician.  Recommendations may be updated based on patient status, additional functional criteria and insurance authorization. ? ?Follow Up Recommendations ? Skilled nursing-short term rehab (<3 hours/day) ?  ?  ?Assistance Recommended at Discharge Intermittent Supervision/Assistance  ?Patient can return home with the following Two people to help with walking and/or transfers;Two people to help with bathing/dressing/bathroom;Assistance with cooking/housework;Assist for transportation;Help with stairs or ramp for entrance;Assistance with feeding ?  ?Equipment Recommendations ? Wheelchair (measurements PT);Wheelchair cushion (measurements PT);Hospital bed;Other (comment) (lift equipment)  ?  ?Recommendations for Other Services   ? ? ?  ?Precautions /  Restrictions Precautions ?Precautions: Fall;Other (comment) ?Precaution Comments: trach, PEG, fragile skin; LE/sacral wound drainage ?Restrictions ?Weight Bearing Restrictions: No  ?  ? ?Mobility ? Bed Mobility ?Overal bed mobility: Needs Assistance ?Bed Mobility: Rolling ?Rolling: Mod assist, +2 for physical assistance ?  ?  ?  ?  ?General bed mobility comments: rolling R/L, assisting with rail support, modA+2 for trunk rotation and lift pad placement ?  ? ?Transfers ?Overall transfer level: Needs assistance ?Equipment used: Ambulation equipment used ?Transfers: Bed to chair/wheelchair/BSC ?  ?  ?  ?  ?  ?  ?General transfer comment: pt tolerated maximove lift from bed to recliner with assist dedicated to LLE management for pain and to protect dressings ?Transfer via Lift Equipment: [place] ? ?Ambulation/Gait ?  ?  ?  ?  ?  ?  ?  ?  ? ? ?Stairs ?  ?  ?  ?  ?  ? ? ?Wheelchair Mobility ?  ? ?Modified Rankin (Stroke Patients Only) ?  ? ? ?  ?Balance   ?  ?  ?  ?  ?  ?  ?  ?  ?  ?  ?  ?  ?  ?  ?  ?  ?  ?  ?  ? ?  ?Cognition Arousal/Alertness: Awake/alert ?Behavior During Therapy: [facility] for tasks assessed/performed ?Overall Cognitive Status: Within Functional Limits for tasks assessed ?  ?  ?  ?  ?  ?  ?  ?  ?  ?  ?  ?  ?  ?  ?  ?  ?General Comments: pt appears in good spirits today and making jokes, motivated to participate, reports pleased to get out of bed to recliner; enjoyed listening to country music ?  ?  ? ?  ?Exercises General Exercises - Lower Extremity ?  Ankle Circles/Pumps: AROM, Both, Seated ?Quad Sets: AROM, Both, Seated ?Gluteal Sets: AROM, Both, Seated ? ?  ?General Comments General comments (skin integrity, edema, etc.): session focused on pt's first time lifting to recliner this session, toelrated well with maximove; geomat and pillows placed for pressure relief to sacrum and LLE; pt happy to be out of bed ?  ?  ? ?Pertinent Vitals/Pain Pain Assessment ?Pain Assessment: Faces ?Faces Pain Scale: Hurts  little more ?Pain Location: buttocks > L hip/groin ?Pain Descriptors / Indicators: Discomfort, Grimacing ?Pain Intervention(s): Monitored during session, Limited activity within patient's tolerance, Repositioned  ? ? ?Home Living   ?  ?  ?  ?  ?  ?  ?  ?  ?  ?   ?  ?Prior Function    ?  ?  ?   ? ?PT Goals (current goals can now be found in the care plan section) Progress towards PT goals: Progressing toward goals ? ?  ?Frequency ? ? ? Min 2X/week ? ? ? ?  ?PT Plan Current plan remains appropriate  ? ? ?Co-evaluation   ?  ?  ?  ?  ? ?  ?AM-PAC PT "6 Clicks" Mobility   ?Outcome Measure ? Help needed turning from your back to your side while in a flat bed without using bedrails?: A Lot ?Help needed moving from lying on your back to sitting on the side of a flat bed without using bedrails?: Total ?Help needed moving to and from a bed to a chair (including a wheelchair)?: Total ?Help needed standing up from a chair using your arms (e.g., wheelchair or bedside chair)?: Total ?Help needed to walk in hospital room?: Total ?Help needed climbing 3-5 steps with a railing? : Total ?6 Click Score: 7 ? ?  ?End of Session   ?Activity Tolerance: Patient tolerated treatment well ?Patient left: in chair;with call bell/phone within reach;with chair alarm set ?Nurse Communication: Mobility status;Need for lift equipment ?PT Visit Diagnosis: Other abnormalities of gait and mobility (R26.89);Muscle weakness (generalized) (M62.81);Pain ?  ? ? ?Time: 5400-8676 ?PT Time Calculation (min) (ACUTE ONLY): 40 min ? ?Charges:  $Therapeutic Exercise: 8-22 mins ?$Therapeutic Activity: 23-37 mins          ?          ? ?Mabeline Caras, PT, DPT ?Acute Rehabilitation Services  ?Pager [phone] ?Office [phone] ? ?[name] ?08/06/2021, 4:01 PM ? ?

## 2021-08-06 NOTE — Plan of Care (Signed)
  Problem: Education: Goal: Knowledge of General Education information will improve Description: Including pain rating scale, medication(s)/side effects and non-pharmacologic comfort measures Outcome: Progressing   Problem: Clinical Measurements: Goal: Respiratory complications will improve Outcome: Progressing   

## 2021-08-06 NOTE — TOC Progression Note (Signed)
Transition of Care (TOC) - Progression Note  ? ? ?Patient Details  ?Name: Tony Young ?MRN: [id number] ?Date of Birth: 10-21-1955 ? ?Transition of Care (TOC) CM/SW Contact  ?Joanne Chars, LCSW ?Phone Number: ?08/06/2021, 3:11 PM ? ?Clinical Narrative: ?The following facilities were contacted regarding SNF placement:  ? ?[facility].  Spoke with Jenny Reichmann in admissions,  referral faxed to [phone]. ? ?[facility]. Spoke with Shirlean Mylar in admissions.  No beds until next week.  Referral faxed to [phone]. ? ?[facility].  Spoke with Larene Beach in admissions.  No beds available. ? ?[facility]. LM with Butch Penny in admissions. ? ?[facility]. LM with Bridgett in admissions. ? ?[facility].  LM with admissions. ? ?[facility] [place].  LM with Joelene Millin in admissions. ? ?CSW also LM with [facility] in [place] area.   ? ?Expected Discharge Plan: [place] ?Barriers to Discharge: Continued Medical Work up, Other (must enter comment), SNF Pending bed offer (trach patient) ? ?Expected Discharge Plan and Services ?Expected Discharge Plan: [place] ?In-house Referral: Clinical Social Work ?  ?Post Acute Care Choice: [place] ?Living arrangements for the past 2 months: [place] ?                ?  ?  ?  ?  ?  ?  ?  ?  ?  ?  ? ? ?Social Determinants of Health (SDOH) Interventions ?  ? ?Readmission Risk Interventions ?No flowsheet data found. ? ?

## 2021-08-06 NOTE — Progress Notes (Signed)
Patient status post skin graft left leg and debridement left sacral pressure ulcer.   ?  ?PE ?VSS ?Skin graft left leg intact with excellent take on removal of vac ?Donor site dressings itact ?  ?A/P ?May resume PT/OT, rehab ?Dressing change to left leg Adaptic, guaze, ACE ?Dressing change to donor site, may reinforce or replace with xeroform or other nonstick dressing ?Left sacral wound wet to dry type dressing daily. ?

## 2021-08-06 NOTE — Progress Notes (Signed)
Mobility Specialist Progress Note  ? ? 08/06/21 1549  ?Mobility  ?Activity Transferred from chair to bed  ?Level of Assistance +2 (takes two people)  ?Assistive Device MaxiMove  ?Activity Response Tolerated well  ?$Mobility charge 1 Mobility  ? ?Left in bed with call bell in reach and RN present.  ? ?Hildred Alamin ?Mobility Specialist  ?M.S. 5N: [phone]  ?

## 2021-08-07 DIAGNOSIS — S81802A Unspecified open wound, left lower leg, initial encounter: Secondary | ICD-10-CM | POA: Diagnosis not present

## 2021-08-07 DIAGNOSIS — F419 Anxiety disorder, unspecified: Secondary | ICD-10-CM | POA: Diagnosis not present

## 2021-08-07 DIAGNOSIS — I5042 Chronic combined systolic (congestive) and diastolic (congestive) heart failure: Secondary | ICD-10-CM | POA: Diagnosis not present

## 2021-08-07 DIAGNOSIS — N1831 Chronic kidney disease, stage 3a: Secondary | ICD-10-CM | POA: Diagnosis not present

## 2021-08-07 LAB — BASIC METABOLIC PANEL
Anion gap: 10 (ref 5–15)
BUN: 20 mg/dL (ref 8–23)
CO2: 21 mmol/L — ABNORMAL LOW (ref 22–32)
Calcium: 8.3 mg/dL — ABNORMAL LOW (ref 8.9–10.3)
Chloride: 102 mmol/L (ref 98–111)
Creatinine, Ser: 1.25 mg/dL — ABNORMAL HIGH (ref 0.61–1.24)
GFR, Estimated: >60 mL/min (ref >60)
Glucose, Bld: 130 mg/dL — ABNORMAL HIGH (ref 70–99)
Potassium: 3.8 mmol/L (ref 3.5–5.1)
Sodium: 133 mmol/L — ABNORMAL LOW (ref 135–145)

## 2021-08-07 LAB — CBC
HCT: 31.8 % — ABNORMAL LOW (ref 39.0–52.0)
Hemoglobin: 9.7 g/dL — ABNORMAL LOW (ref 13.0–17.0)
MCH: 27.7 pg (ref 26.0–34.0)
MCHC: 30.5 g/dL (ref 30.0–36.0)
MCV: 90.9 fL (ref 80.0–100.0)
Platelets: 203 10*3/uL (ref 150–400)
RBC: 3.5 MIL/uL — ABNORMAL LOW (ref 4.22–5.81)
RDW: 16.2 % — ABNORMAL HIGH (ref 11.5–15.5)
WBC: 7.3 10*3/uL (ref 4.0–10.5)
nRBC: 0 % (ref 0.0–0.2)

## 2021-08-07 LAB — GLUCOSE, CAPILLARY
Glucose-Capillary: 113 mg/dL — ABNORMAL HIGH (ref 70–99)
Glucose-Capillary: 212 mg/dL — ABNORMAL HIGH (ref 70–99)
Glucose-Capillary: 188 mg/dL — ABNORMAL HIGH (ref 70–99)
Glucose-Capillary: 273 mg/dL — ABNORMAL HIGH (ref 70–99)

## 2021-08-07 MED ORDER — ALBUTEROL SULFATE (2.5 MG/3ML) 0.083% IN NEBU: 2.5000 mg | INHALATION_SOLUTION | RESPIRATORY_TRACT | Status: DC | PRN

## 2021-08-07 NOTE — Progress Notes (Signed)
?  08/07/21 1440  ?Clinical Encounter Type  ?Visited With Health care provider;Patient and family together  ?Visit Type Initial ?(Advance Directive Education)  ?Referral From Nurse  ?Consult/Referral To Chaplain  ? ?Chaplain Melvenia Beam met with Mr. Tony Young. Tony Young, Sr. at patient's bedside regarding the completion of his Advance Directive. Patient acknowledged that preparing A.D. was something important that he would like to have completed. Chaplain explained that A.D. is only used when the patient cannot make medical decisions about his medical care for himself, providing instruction to the doctors and his agents of how he wishes to be cared for.  ? ?Tony Young stated after careful thought that he plans to name his daughter Tony Young [phone], and his son-in-law, Tony Young [phone] as his Medical Power of Attorneys.   ? ?Chaplain explained the importance of having conversation with his daughter and son-in-law prior to naming them as his agents.   ? ?Tony Young's daughter had a blank copy of the H.C.P.O.A. and Living Will that she printed from the [facility] website and completed the forms per the patient's instruction in the presence of myself.  Notary and volunteer witnesses were not available at this time to witness Tony Young signing of Part C. To the best of his ability the patient placed his initials in sections 1, 2, and 5 of his living will, because of his tremors it is not fully legible.   ? ?Chaplain will follow up with patient to arrange for witnesses and Notary assistance. Tony Young, M. [place]. may be contacted at [phone] for further assistance. ?

## 2021-08-07 NOTE — TOC Progression Note (Signed)
Transition of Care (TOC) - Progression Note  ? ? ?Patient Details  ?Name: Delia Sitar ?MRN: [id number] ?Date of Birth: 09/09/1955 ? ?Transition of Care (TOC) CM/SW Contact  ?Coralee Pesa, LCSWA ?Phone Number: ?08/07/2021, 9:55 AM ? ?Clinical Narrative:    ? ?CSW followed up with  ?[facility] ? ?[facility] [phone]) With [facility] stated they could accept today. They will need feeding tube supplies and any wound vac information if applicable. CSW updated primary CSW who will proceed with discharge plans. TOC will continue to follow. ? ?Expected Discharge Plan: [place] ?Barriers to Discharge: Continued Medical Work up, Other (must enter comment), SNF Pending bed offer (trach patient) ? ?Expected Discharge Plan and Services ?Expected Discharge Plan: [place] ?In-house Referral: Clinical Social Work ?  ?Post Acute Care Choice: [place] ?Living arrangements for the past 2 months: [place] ?                ?  ?  ?  ?  ?  ?  ?  ?  ?  ?  ? ? ?Social Determinants of Health (SDOH) Interventions ?  ? ?Readmission Risk Interventions ?No flowsheet data found. ? ?

## 2021-08-07 NOTE — Plan of Care (Signed)
  Problem: Education: Goal: Knowledge of General Education information will improve Description: Including pain rating scale, medication(s)/side effects and non-pharmacologic comfort measures Outcome: Progressing   Problem: Health Behavior/Discharge Planning: Goal: Ability to manage health-related needs will improve Outcome: Progressing   Problem: Clinical Measurements: Goal: Respiratory complications will improve Outcome: Progressing   Problem: Coping: Goal: Level of anxiety will decrease Outcome: Progressing   Problem: Pain Managment: Goal: General experience of comfort will improve Outcome: Progressing   

## 2021-08-07 NOTE — Progress Notes (Signed)
?PROGRESS NOTE ? ? ? ?Tony Young  ZWC:[id number] DOB: 1955/07/26 DOA: 07/29/2021 ?PCP: Rosaria Ferries, MD  ? ? ?Brief Narrative:  ?Tony Young is a 66 y.o. male with past medical history of obesity, obstructive sleep apnea, diabetes mellitus type 2, coronary artery disease, COPD, colon cancer who was recently admitted to [facility] in [place] for congestive heart failure on December 2022.  At that time patient had developed septic shock due to necrotizing fasciitis of the left lower extremity  with strep pyogenes bacteremia.  He was intubated for respiratory failure, required pressors, subsequently developed acute renal failure he required multiple I&D of left lower extremity.  He required CRRT and was transitioned to dialysis when stable.  He developed DVT and was started on heparin drip but that had to be stopped because of GI bleed.  Patient did have EGD on 12/24 showed gastric ulcer and required multiple units PRBCs.  On 12/27, he underwent trach/PEG.  [facility]21 for further management. While at [facility], he had pneumonia, COPD exacerbation, bouts of A-fib.  His respiratory status improved and he was liberated off the ventilator to oxygen by trach collar on 1/25.  He had issues with overload but was not able to aggressively diurese because of recurrent AKI.  He was admitted to CIR on 2/15 because of functional decline.  Per CIR note, patient was not able to participate with them as expected. Throughout the course of his illness since January, he was bedridden and sustained sacral ulcer.  While at rehabilitation, patient was seen by plastic surgery who recommended I&D of the left leg and sacral wound.  Patient underwent debridement of the left leg wound with skin grafting on 07/29/2021.  Plastic surgery following the patient during hospitalization.  Currently, patient is extremely debilitated and weak and is awaiting for skilled nursing facility  placement. ?  ?  ?Assessment and Plan: ?* Open leg wound ?History of necrotizing fasciitis  ?Patient has had multiple I&D's in the past.  Underwent leg wound debridement and skin grafting by plastic surgery on 07/29/2021.   Not on antibiotic at this time. Followed by plastic surgery Dr. Erin Hearing.  Further dressing plans as per plastic surgery.  Wound care on board as well.  We will continue dressing on discharge. ? 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  Status post tracheostomy 05/26/2021.  PCCM and respiratory therapy on board.Per rehab note from 3/1, continue button plugging during the day and ATC at night for humidification.  Currently appears stable. ? ?Open wound of left buttock ?Sacral pressure ulceration. ?Patient underwent debridement and skin grafting on 07/29/2021 by plastic surgery.  Further dressing plans as per plastic surgery.  Wound care on board. ? ?Chronic combined systolic and diastolic CHF (congestive heart failure) ([place]) ?Appears compensated at this time.  2D echocardiogram 2/22 showed LV ejection fraction of 40 to 45%.  Myoview was negative for ischemia.  Patient has been on IV Lasix with holding parameters at this time.   Latest creatinine of 1.2 with BUN of 20.  Negative balance for 6891 ml.  Monitor BMP ? ?Paroxysmal atrial fibrillation with RVR ([place]) ?On amiodarone, Eliquis and metoprolol.  Does have intermittent episodes of RVR.  Treatment at times limited by hypotension will change DuoNebs every 4 hourly from 2 hourly. ? ?Controlled type 2 diabetes mellitus with hyperglycemia, with long-term current use of insulin ([place]) ?With hyperglycemia.  Hemoglobin A1c of 5.9 on 07/29/2021.  Continue Lantus, sliding scale insulin. ? ?Hypokalemia ?Improved after replacement.   ? ?Anemia due to chronic kidney disease ?Recent history of GI bleed.  ? Baseline hemoglobin of around 9.0.  Continue to monitor.  Patient had EGD 05/23/2021 which showed a gastric  ulcer and required multiple units of PRBC.  Continue iron and PP.  Latest hemoglobin of 9.7. ? ?Dvt femoral (deep venous thrombosis) ([place]) ?Patient did have a history of extensive DVT in the right lower extremity on 07/17/21 is on Eliquis at this time.  Hemoglobin is stable ? ?Anxiety and depression ?Patient was getting buspirone, modafinil, Lyrica Zoloft and risperidone, primidone at the CIR. currently on buspirone and sertraline. ? ?CKD stage 2 due to type 2 diabetes mellitus ([place]) ?Continue to monitor creatinine.  Latest creatinine at 1.2. ? ?OSA (obstructive sleep apnea) ?On tracheostomy collar at this time. ? ?Debility ?Deconditioning.  History of critical illness myopathy necrotizing fasciitis prolonged hospitalization with respiratory failure.  Was in CIR 2/15 to 2-28.  Awaiting for skilled nursing facility on discharge. ? ? ? DVT prophylaxis: SCD's Start: 07/29/21 2017 ?apixaban (ELIQUIS) tablet 5 mg  ? ?Code Status:   ?  Code Status: Full Code ? ?Disposition: Skilled nursing facility as per PT, OT recommendation. ? ?Status is: Inpatient ? ?Remains inpatient appropriate because:  need for placement ? ? Family Communication:  ?Spoke with the patient's daughter on the phone on 08/05/2021  ? ?Consultants:  ?Plastic surgery ?Palliative care ? ?Procedures:  ?Left lower extremity debridement and skin grafting on 07/29/2021 by plastic surgery.   ?Debridement of the left sacral stage III pressure ulceration on 07/29/2021. ? ?Antimicrobials:  ?None ? ?Anti-infectives (From admission, onward)  ? ? None  ? ?  ? ?Subjective: ? ?Today, patient was seen and examined at bedside.  Complains of mild pain over the hip.  Denies any fever, chills or rigor.  No nausea vomiting or chest pain ? ?Objective: ?Vitals:  ? 08/07/21 0330 08/07/21 0745 08/07/21 0842 08/07/21 1129  ?BP:   105/66   ?Pulse: (!) 108 (!) 109 (!) 122 (!) 114  ?Resp: '16  19 20  '$ ?Temp:  (!) 97.4 ?F (36.3 ?C)    ?TempSrc:  Oral    ?SpO2: 96% 100% 93% 96%   ? ? ?Intake/Output Summary (Last 24 hours) at 08/07/2021 1245 ?Last data filed at 08/07/2021 1100 ?Gross per 24 hour  ?Intake 300 ml  ?Output 2900 ml  ?Net -2600 ml  ? ?There were no vitals filed for this visit. ? ?There is no height or weight on file to calculate BMI.  ?Physical Examination: ? ?General: Obese built, not in obvious distress, appears ill and deconditioned ?HENT:   No scleral pallor or icterus noted. Oral mucosa is moist.  ?Chest:  .  Diminished breath sounds bilaterally.  ?CVS: S1 &S2 heard. No murmur.  Regular rate and rhythm. ?Abdomen: Soft, nontender, nondistended.  Bowel sounds are heard.  External urinary catheter in place. ?Extremities: No cyanosis, clubbing or edema.  Peripheral pulses are palpable.  Left lower extremity thigh wound, sacral decubitus ulceration. ?Psych: Alert, awake and oriented, normal mood ?CNS:  No cranial nerve deficits.  Generalized weakness of the extremities noted. ?Skin: Warm and dry.  Left thigh wound, decubitus ulceration ? ? ?Data Reviewed:  ? ?CBC: ?Recent Labs  ?Lab 08/01/21 ?0218 08/02/21 ?0201 08/04/21 ?8182 08/05/21 ?9937 08/07/21 ?0357  ?WBC 6.1 5.8 6.3 7.2 7.3  ?HGB 9.9* 9.3* 11.3* 9.7* 9.7*  ?HCT 31.8* 29.4* 36.8* 32.2* 31.8*  ?MCV 91.1  89.6 90.2 90.7 90.9  ?PLT 148* 137* 180 218 203  ? ? ?Basic Metabolic Panel: ?Recent Labs  ?Lab 08/02/21 ?0201 08/03/21 ?0146 08/04/21 ?1388 08/05/21 ?7195 08/07/21 ?0357  ?NA 136 135 139 135 133*  ?K 3.5 3.4* 3.4* 3.9 3.8  ?CL 105 103 104 102 102  ?CO2 '23 22 24 23 '$ 21*  ?GLUCOSE 95 155* 100* 100* 130*  ?BUN '14 12 13 16 20  '$ ?CREATININE 1.00 1.08 1.21 1.27* 1.25*  ?CALCIUM 7.8* 7.8* 8.5* 8.1* 8.3*  ?MG  --   --   --  2.0  --   ?PHOS  --   --   --  2.9  --   ? ? ?Liver Function Tests: ?No results for input(s): AST, ALT, ALKPHOS, BILITOT, PROT, ALBUMIN in the last 168 hours. ? ? ? ?Radiology Studies: ?No results found. ? ? ? LOS: 9 days  ? ? ?Flora Lipps, MD ?[facility] ?08/07/2021, 12:45 PM  ?  ?

## 2021-08-07 NOTE — TOC Progression Note (Signed)
Transition of Care (TOC) - Progression Note  ? ? ?Patient Details  ?Name: Tony Young ?MRN: [id number] ?Date of Birth: 04/10/1956 ? ?Transition of Care (TOC) CM/SW Contact  ?Joanne Chars, LCSW ?Phone Number: ?08/07/2021, 12:20 PM ? ?Clinical Narrative:  CSW informed that [facility], [place] can accept pt.  CSW spoke with pt about this bed offer and he was agreeable, wants to talk to daughter.  CSW spoke with daughter Lovell Sheehan and she is on her way to the hospital from [place] now, but is also agreeable to this plan.  CSW spoke Lelon Frohlich in admissions at [facility], [phone], she had several questions about trach and tube feeding.  She called back at 1200 and confirmed they can accept pt but not until Monday as they need to order supplices.    ? ? ? ?Expected Discharge Plan: [place] ?Barriers to Discharge: Continued Medical Work up, Other (must enter comment), SNF Pending bed offer (trach patient) ? ?Expected Discharge Plan and Services ?Expected Discharge Plan: [place] ?In-house Referral: Clinical Social Work ?  ?Post Acute Care Choice: [place] ?Living arrangements for the past 2 months: [place] ?                ?  ?  ?  ?  ?  ?  ?  ?  ?  ?  ? ? ?Social Determinants of Health (SDOH) Interventions ?  ? ?Readmission Risk Interventions ?No flowsheet data found. ? ?

## 2021-08-08 DIAGNOSIS — E876 Hypokalemia: Secondary | ICD-10-CM

## 2021-08-08 DIAGNOSIS — S81802A Unspecified open wound, left lower leg, initial encounter: Secondary | ICD-10-CM | POA: Diagnosis not present

## 2021-08-08 DIAGNOSIS — I5042 Chronic combined systolic (congestive) and diastolic (congestive) heart failure: Secondary | ICD-10-CM | POA: Diagnosis not present

## 2021-08-08 DIAGNOSIS — F419 Anxiety disorder, unspecified: Secondary | ICD-10-CM | POA: Diagnosis not present

## 2021-08-08 DIAGNOSIS — N1831 Chronic kidney disease, stage 3a: Secondary | ICD-10-CM | POA: Diagnosis not present

## 2021-08-08 LAB — BASIC METABOLIC PANEL
Anion gap: 9 (ref 5–15)
BUN: 19 mg/dL (ref 8–23)
CO2: 25 mmol/L (ref 22–32)
Calcium: 8.4 mg/dL — ABNORMAL LOW (ref 8.9–10.3)
Chloride: 102 mmol/L (ref 98–111)
Creatinine, Ser: 1.13 mg/dL (ref 0.61–1.24)
GFR, Estimated: >60 mL/min (ref >60)
Glucose, Bld: 118 mg/dL — ABNORMAL HIGH (ref 70–99)
Potassium: 3.6 mmol/L (ref 3.5–5.1)
Sodium: 136 mmol/L (ref 135–145)

## 2021-08-08 LAB — CBC
HCT: 29.4 % — ABNORMAL LOW (ref 39.0–52.0)
Hemoglobin: 9 g/dL — ABNORMAL LOW (ref 13.0–17.0)
MCH: 27.6 pg (ref 26.0–34.0)
MCHC: 30.6 g/dL (ref 30.0–36.0)
MCV: 90.2 fL (ref 80.0–100.0)
Platelets: 237 10*3/uL (ref 150–400)
RBC: 3.26 MIL/uL — ABNORMAL LOW (ref 4.22–5.81)
RDW: 16.1 % — ABNORMAL HIGH (ref 11.5–15.5)
WBC: 8.1 10*3/uL (ref 4.0–10.5)
nRBC: 0 % (ref 0.0–0.2)

## 2021-08-08 LAB — GLUCOSE, CAPILLARY
Glucose-Capillary: 137 mg/dL — ABNORMAL HIGH (ref 70–99)
Glucose-Capillary: 157 mg/dL — ABNORMAL HIGH (ref 70–99)
Glucose-Capillary: 211 mg/dL — ABNORMAL HIGH (ref 70–99)
Glucose-Capillary: 316 mg/dL — ABNORMAL HIGH (ref 70–99)

## 2021-08-08 NOTE — Progress Notes (Signed)
?PROGRESS NOTE ? ? ? ?Tony Young  CHE:[id number] DOB: Sep 19, 1955 DOA: 07/29/2021 ?PCP: Rosaria Ferries, MD  ? ? ?Brief Narrative:  ?Tony Young is a 66 y.o. male with past medical history of obesity, obstructive sleep apnea, diabetes mellitus type 2, coronary artery disease, COPD, colon cancer who was recently admitted to [facility] in [place] for congestive heart failure on December 2022.  At that time patient had developed septic shock due to necrotizing fasciitis of the left lower extremity  with strep pyogenes bacteremia.  He was intubated for respiratory failure, required pressors, subsequently developed acute renal failure he required multiple I&D of left lower extremity.  He required CRRT and was transitioned to dialysis when stable.  He developed DVT and was started on heparin drip but that had to be stopped because of GI bleed.  Patient did have EGD on 12/24 showed gastric ulcer and required multiple units PRBCs.  On 12/27, he underwent trach/PEG.  He was [facility]21 for further management. While at [facility], he had pneumonia, COPD exacerbation, bouts of A-fib.  His respiratory status improved and he was liberated off the ventilator to oxygen by trach collar on 1/25.  He had issues with overload but was not able to aggressively diurese because of recurrent AKI.  He was admitted to CIR on 2/15 because of functional decline.  Per CIR note, patient was not able to participate with them as expected. Throughout the course of his illness since January, he was bedridden and sustained sacral ulcer.  While at rehabilitation, patient was seen by plastic surgery who recommended I&D of the left leg and sacral wound.  Patient underwent debridement of the left leg wound with skin grafting on 07/29/2021.  Plastic surgery following the patient during hospitalization.  Currently, patient is extremely debilitated and weak and is awaiting for skilled nursing facility  placement medically stable for disposition at this time. ?  ?  ?Assessment and Plan: ?* Open leg wound ?History of necrotizing fasciitis  ?Patient has had multiple I&D's in the past.  Underwent leg wound debridement and skin grafting by plastic surgery on 07/29/2021.   Not on antibiotic at this time. Followed by plastic surgery Dr. Erin Hearing.  Further dressing plans as per plastic surgery.  Wound care on board as well.  We will continue dressing on discharge. ? 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  Status post tracheostomy 05/26/2021.  PCCM and respiratory therapy on board.Per rehab note from 3/1, continue button plugging during the day and ATC at night for humidification.  Currently appears stable. ? ?Open wound of left buttock ?Sacral pressure ulceration. ?Patient underwent debridement and skin grafting on 07/29/2021 by plastic surgery.  Further dressing plans as per plastic surgery.  Wound care on board. ? ?Chronic combined systolic and diastolic CHF (congestive heart failure) ([place]) ?Appears compensated at this time.  2D echocardiogram 2/22 showed LV ejection fraction of 40 to 45%.  Myoview was negative for ischemia.  Patient has been on IV Lasix with holding parameters at this time.   Latest creatinine of 1.2 with BUN of 20.  Negative balance for 7081 ml.  Monitor BMP ? ?Paroxysmal atrial fibrillation with RVR ([place]) ?On amiodarone, Eliquis and metoprolol.  Does have intermittent episodes of RVR.  Treatment at times limited by hypotension. Have changed change DuoNebs every 4 hourly from 2 hourly. ? ?Controlled type 2 diabetes mellitus with hyperglycemia, with long-term current use of insulin ([place]) ?With hyperglycemia.  Hemoglobin A1c of 5.9 on 07/29/2021.  Continue Lantus, sliding scale insulin. ? ?Hypokalemia ?Improved after replacement.   ? ?Anemia due to chronic kidney disease ?Recent history of GI bleed.  ? Baseline hemoglobin of around 9.0.  Continue to  monitor.  Patient had EGD 05/23/2021 which showed a gastric ulcer and required multiple units of PRBC.  Continue iron and PP.  Latest hemoglobin of 9.0.  No mention of further bleed. ? ?Dvt femoral (deep venous thrombosis) ([place]) ?Patient did have a history of extensive DVT in the right lower extremity on 07/17/21 is on Eliquis at this time.  Hemoglobin is stable ? ?Anxiety and depression ?Patient was getting buspirone, modafinil, Lyrica Zoloft and risperidone, primidone at the CIR. currently on buspirone and sertraline. ? ?CKD stage 2 due to type 2 diabetes mellitus ([place]) ?Continue to monitor creatinine.  Latest creatinine at 1.1`. ? ?OSA (obstructive sleep apnea) ?On tracheostomy collar at this time. ? ?Debility ?Deconditioning.  History of critical illness myopathy necrotizing fasciitis prolonged hospitalization with respiratory failure.  Was in CIR 2/15 to 2-28.  Awaiting for skilled nursing facility on discharge. ? ? ? DVT prophylaxis: SCD's Start: 07/29/21 2017 ?apixaban (ELIQUIS) tablet 5 mg  ? ?Code Status:   ?  Code Status: Full Code ? ?Disposition: Skilled nursing facility as per PT, OT recommendation likely 08/10/2021 ? ?Status is: Inpatient ? ?Remains inpatient appropriate because:  need for placement ? ? Family Communication:  ?Spoke with the patient's daughter on the phone on 08/05/2021  ? ?Consultants:  ?Plastic surgery ?Palliative care ? ?Procedures:  ?Left lower extremity debridement and skin grafting on 07/29/2021 by plastic surgery.   ?Debridement of the left sacral stage III pressure ulceration on 07/29/2021. ? ?Antimicrobials:  ?None ? ?Anti-infectives (From admission, onward)  ? ? None  ? ?  ? ?Subjective: ? ?Today, patient was seen and examined at bedside.  Patient complains of mild back pain and thigh pain.  Denies shortness of breath cough fever. ? ?Objective: ?Vitals:  ? 08/07/21 2320 08/08/21 0256 08/08/21 0558 08/08/21 0825  ?BP:   114/69 (!) 99/51  ?Pulse: 92 96 100 (!) 126  ?Resp: '16 18  18   '$ ?Temp: 98.4 ?F (36.9 ?C)  97.6 ?F (36.4 ?C) 97.9 ?F (36.6 ?C)  ?TempSrc: Oral   Oral  ?SpO2: 96% 98% 98% 96%  ? ? ?Intake/Output Summary (Last 24 hours) at 08/08/2021 1058 ?Last data filed at 08/07/2021 2026 ?Gross per 24 hour  ?Intake 360 ml  ?Output 1250 ml  ?Net -890 ml  ? ?There were no vitals filed for this visit. ? ?There is no height or weight on file to calculate BMI.  ? ?Physical Examination: ? ?General: Obese built, not in obvious distress, appears ill and deconditioned ?HENT:   No scleral pallor or icterus noted. Oral mucosa is moist.  ?Chest:    Diminished breath sounds bilaterally.  ?CVS: S1 &S2 heard. No murmur.  Regular rate and rhythm. ?Abdomen: Soft, nontender, nondistended.  Bowel sounds are heard.  External urinary cathete in place. ?Extremities: Left lower extremity-thigh wound, sacral decubitus ulceration. ?Psych: Alert, awake and oriented, normal mood ?CNS:  No cranial nerve deficits.  Generalized weakness of the extremities noted. ?Skin: Warm and dry.  Left lower extremity thigh wound, decubitus ulceration ? ?Data Reviewed:  ? ?CBC: ?Recent Labs  ?Lab 08/02/21 ?0201 08/04/21 ?2297 08/05/21 ?9892 08/07/21 ?1194 08/08/21 ?0330  ?WBC 5.8 6.3 7.2 7.3 8.1  ?HGB 9.3* 11.3* 9.7* 9.7* 9.0*  ?HCT 29.4* 36.8* 32.2* 31.8* 29.4*  ?  MCV 89.6 90.2 90.7 90.9 90.2  ?PLT 137* 180 218 203 237  ? ? ?Basic Metabolic Panel: ?Recent Labs  ?Lab 08/03/21 ?0146 08/04/21 ?7253 08/05/21 ?6644 08/07/21 ?0347 08/08/21 ?0330  ?NA 135 139 135 133* 136  ?K 3.4* 3.4* 3.9 3.8 3.6  ?CL 103 104 102 102 102  ?CO2 '22 24 23 '$ 21* 25  ?GLUCOSE 155* 100* 100* 130* 118*  ?BUN '12 13 16 20 19  '$ ?CREATININE 1.08 1.21 1.27* 1.25* 1.13  ?CALCIUM 7.8* 8.5* 8.1* 8.3* 8.4*  ?MG  --   --  2.0  --   --   ?PHOS  --   --  2.9  --   --   ? ? ?Liver Function Tests: ?No results for input(s): AST, ALT, ALKPHOS, BILITOT, PROT, ALBUMIN in the last 168 hours. ? ? ? ?Radiology Studies: ?No results found. ? ? ? LOS: 10 days  ? ? ?Flora Lipps, MD ?[facility] ?08/08/2021, 10:58 AM  ?  ?

## 2021-08-09 DIAGNOSIS — I5042 Chronic combined systolic (congestive) and diastolic (congestive) heart failure: Secondary | ICD-10-CM | POA: Diagnosis not present

## 2021-08-09 DIAGNOSIS — N1831 Chronic kidney disease, stage 3a: Secondary | ICD-10-CM | POA: Diagnosis not present

## 2021-08-09 DIAGNOSIS — S81802A Unspecified open wound, left lower leg, initial encounter: Secondary | ICD-10-CM | POA: Diagnosis not present

## 2021-08-09 DIAGNOSIS — F419 Anxiety disorder, unspecified: Secondary | ICD-10-CM | POA: Diagnosis not present

## 2021-08-09 LAB — GLUCOSE, CAPILLARY
Glucose-Capillary: 102 mg/dL — ABNORMAL HIGH (ref 70–99)
Glucose-Capillary: 122 mg/dL — ABNORMAL HIGH (ref 70–99)
Glucose-Capillary: 154 mg/dL — ABNORMAL HIGH (ref 70–99)
Glucose-Capillary: 236 mg/dL — ABNORMAL HIGH (ref 70–99)
Glucose-Capillary: 256 mg/dL — ABNORMAL HIGH (ref 70–99)
Glucose-Capillary: 294 mg/dL — ABNORMAL HIGH (ref 70–99)

## 2021-08-09 NOTE — Progress Notes (Signed)
Mobility Specialis ? 08/09/21 1637  ?Mobility  ?Activity Transferred from bed to chair  ?Level of Assistance Total care  ?Assistive Device MaxiMove  ?Activity Response Tolerated well  ?$Mobility charge 1 Mobility  ? ?Received pt in bed having no complaints and agreeable to mobility. Asymptomatic throughout transfer, to chair w/ call bell in reach and all needs met. ? ?Tony Young ?Mobility Specialist ?Mobility Specialist [place]: [phone] ?Mobility Specialist [place]: [phone] ? ?

## 2021-08-09 NOTE — Progress Notes (Addendum)
?PROGRESS NOTE ? ? ? ?Tony Young  WUJ:[id number] DOB: 17-May-1956 DOA: 07/29/2021 ?PCP: Rosaria Ferries, MD  ? ? ?Brief Narrative:  ?Tony Young is a 66 y.o. male with past medical history of obesity, obstructive sleep apnea, diabetes mellitus type 2, coronary artery disease, COPD, colon cancer who was recently admitted to [facility] in [place] for congestive heart failure on December 2022.  At that time patient had developed septic shock due to necrotizing fasciitis of the left lower extremity  with strep pyogenes bacteremia.  He was intubated for respiratory failure, required pressors, subsequently developed acute renal failure he required multiple I&D of left lower extremity.  He required CRRT and was transitioned to dialysis when stable.  He developed DVT and was started on heparin drip but that had to be stopped because of GI bleed.  Patient did have EGD on 12/24 showed gastric ulcer and required multiple units PRBCs.  On 12/27, he underwent trach/PEG.  [facility] for further management. While at [facility], he had pneumonia, COPD exacerbation, bouts of A-fib.  His respiratory status improved and he was liberated off the ventilator to oxygen by trach collar on 1/25.  He had issues with overload but was not able to aggressively diurese because of recurrent AKI.  He was admitted to CIR on 2/15 because of functional decline.  Per CIR note, patient was not able to participate with them as expected. Throughout the course of his illness since January, he was bedridden and sustained sacral ulcer.  While at rehabilitation, patient was seen by plastic surgery who recommended I&D of the left leg and sacral wound.  Patient underwent debridement of the left leg wound with skin grafting on 07/29/2021.  Plastic surgery following the patient during hospitalization.  Currently, patient is extremely debilitated and weak and is currently awaiting for skilled nursing  facility placement medically stable for disposition at this time. ?  ?  ?Assessment and Plan: ?* Open leg wound ?History of necrotizing fasciitis  ?Patient has had multiple I&D's in the past.  Underwent leg wound debridement and skin grafting by plastic surgery on 07/29/2021.   Not on antibiotic at this time. Followed by plastic surgery Dr. Erin Hearing.  Wound care on board for dressing ? 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  Status post tracheostomy 05/26/2021.  PCCM and respiratory therapy on board.Per rehab note from 3/1, continue button plugging during the day and ATC at night for humidification.  Currently appears stable. ? ?Open wound of left buttock ?Sacral pressure ulceration. ?Present on admission.  Patient underwent debridement and skin grafting on 07/29/2021 by plastic surgery.  Further dressing plans as per plastic surgery.  Wound care on board. ? ?Chronic combined systolic and diastolic CHF (congestive heart failure) ([place]) ?Appears compensated at this time.  2D echocardiogram 2/22 showed LV ejection fraction of 40 to 45%.  Myoview was negative for ischemia.  Patient has been on IV Lasix with holding parameters at this time.   Latest creatinine of 1.2 with BUN of 20.  Negative balance for 7781 ml.  Monitor BMP ? ?Paroxysmal atrial fibrillation with RVR ([place]) ?On amiodarone, Eliquis and metoprolol.  Does have intermittent episodes of RVR.  Treatment at times limited by hypotension. On DuoNebs every 4 hourly from 2 hourly. ? ?Controlled type 2 diabetes mellitus with hyperglycemia, with long-term current use of insulin ([place]) ?With hyperglycemia.  Hemoglobin A1c of 5.9 on 07/29/2021.  Continue Lantus, sliding scale  insulin.  Now controlled. ? ?Hypokalemia ?Improved after replacement.  Potassium 3.6 ? ?Anemia due to chronic kidney disease ?Recent history of GI bleed.  ? Baseline hemoglobin of around 9.0.  Continue to monitor.  Patient had EGD 05/23/2021 which  showed a gastric ulcer and required multiple units of PRBC.  Continue iron and PP.  Latest hemoglobin of 9.0.  No mention of further bleed. ? ?Dvt femoral (deep venous thrombosis) ([place]) ?Patient did have a history of extensive DVT in the right lower extremity on 07/17/21 is on Eliquis at this time.  Hemoglobin is stable ? ?Anxiety and depression ?Patient was getting buspirone, modafinil, Lyrica Zoloft and risperidone, primidone at the CIR. currently on buspirone and sertraline. ? ?CKD stage 2 due to type 2 diabetes mellitus ([place]) ?Continue to monitor creatinine.  Latest creatinine at 1.1`. ? ?OSA (obstructive sleep apnea) ?On tracheostomy collar at this time. ? ?Debility ?Deconditioning.  History of critical illness myopathy necrotizing fasciitis prolonged hospitalization with respiratory failure.  Was in CIR 2/15 to 2-28.  Awaiting for skilled nursing facility on discharge. ? ? ? DVT prophylaxis: SCD's Start: 07/29/21 2017 ?apixaban (ELIQUIS) tablet 5 mg  ? ?Code Status:   ?  Code Status: Full Code ? ?Disposition: Skilled nursing facility likely 08/10/2021 ? ?Status is: Inpatient ? ?Remains inpatient appropriate because:  need for placement ? ? Family Communication:  ? Spoke with the patient's daughter on the phone on 08/09/21 and updated her about the clinical condition of the patient and potential for disposition sent to ? ?Consultants:  ?Plastic surgery ?Palliative care ? ?Procedures:  ?Left lower extremity debridement and skin grafting on 07/29/2021 by plastic surgery.   ?Debridement of the left sacral stage III pressure ulceration on 07/29/2021. ? ?Antimicrobials:  ?None ? ?Anti-infectives (From admission, onward)  ? ? None  ? ?  ? ?Subjective: ? ?Today, patient was seen and examined at bedside.  Denies excessive pain but has mild pain.  No nausea vomiting fever chills or rigor.  No shortness of breath. ? ?Objective: ?Vitals:  ? 08/09/21 0326 08/09/21 0407 08/09/21 9924 08/09/21 0751  ?BP:  106/65  105/63  ?Pulse:  (!) 106   (!) 103  ?Resp: 19   18  ?Temp:  98.3 ?F (36.8 ?C)  98.2 ?F (36.8 ?C)  ?TempSrc:  Oral    ?SpO2: 97%  97% 97%  ? ? ?Intake/Output Summary (Last 24 hours) at 08/09/2021 1412 ?Last data filed at 08/09/2021 1308 ?Gross per 24 hour  ?Intake 240 ml  ?Output 1150 ml  ?Net -910 ml  ? ?There were no vitals filed for this visit. ? ?There is no height or weight on file to calculate BMI.  ? ?Physical Examination: ? ?General: Obese built, not in obvious distress, appears chronically ill and deconditioned ?HENT:   No scleral pallor or icterus noted. Oral mucosa is moist.  ?Chest:  Diminished breath sounds bilaterally. No crackles or wheezes.  ?CVS: S1 &S2 heard. No murmur.  Regular rate and rhythm. ?Abdomen: Soft, nontender, nondistended.  Bowel sounds are heard.   ?Extremities: No cyanosis, clubbing or edema.  Peripheral pulses are palpable.  Left lower extremity with thigh wound, ?Psych: Alert, awake and oriented, normal mood ?CNS:  No cranial nerve deficits.  Generalized weakness of the extremities.  Intensional tremors noted in the upper extremities. ?Skin: Warm and dry.  Left thigh wound and decubitus ulceration. ? ?Data Reviewed:  ? ?CBC: ?Recent Labs  ?Lab 08/04/21 ?2683 08/05/21 ?4196 08/07/21 ?2229 08/08/21 ?0330  ?WBC 6.3  7.2 7.3 8.1  ?HGB 11.3* 9.7* 9.7* 9.0*  ?HCT 36.8* 32.2* 31.8* 29.4*  ?MCV 90.2 90.7 90.9 90.2  ?PLT 180 218 203 237  ? ? ?Basic Metabolic Panel: ?Recent Labs  ?Lab 08/03/21 ?0146 08/04/21 ?0932 08/05/21 ?6712 08/07/21 ?4580 08/08/21 ?0330  ?NA 135 139 135 133* 136  ?K 3.4* 3.4* 3.9 3.8 3.6  ?CL 103 104 102 102 102  ?CO2 '22 24 23 '$ 21* 25  ?GLUCOSE 155* 100* 100* 130* 118*  ?BUN '12 13 16 20 19  '$ ?CREATININE 1.08 1.21 1.27* 1.25* 1.13  ?CALCIUM 7.8* 8.5* 8.1* 8.3* 8.4*  ?MG  --   --  2.0  --   --   ?PHOS  --   --  2.9  --   --   ? ? ?Liver Function Tests: ?No results for input(s): AST, ALT, ALKPHOS, BILITOT, PROT, ALBUMIN in the last 168 hours. ? ? ? ?Radiology Studies: ?No results found. ? ? ?  LOS: 11 days  ? ? ?Flora Lipps, MD ?[facility] ?08/09/2021, 2:12 PM  ?  ?

## 2021-08-09 NOTE — Progress Notes (Signed)
Mobility Specialist: Progress Note ? ? 08/09/21 1749  ?Mobility  ?Activity Transferred from chair to bed  ?Level of Assistance Total care  ?Assistive Device MaxiMove  ?Activity Response Tolerated well  ?$Mobility charge 1 Mobility  ? ?Assisted pt back to bed per request. Call bell and phone at his side with RN present in the room.  ? ?Tony Young ?Mobility Specialist ?Mobility Specialist [place]: [phone] ?Mobility Specialist [place]: [phone] ? ?

## 2021-08-10 ENCOUNTER — Other Ambulatory Visit: Payer: Self-pay

## 2021-08-10 DIAGNOSIS — N1831 Chronic kidney disease, stage 3a: Secondary | ICD-10-CM | POA: Diagnosis not present

## 2021-08-10 DIAGNOSIS — F419 Anxiety disorder, unspecified: Secondary | ICD-10-CM | POA: Diagnosis not present

## 2021-08-10 DIAGNOSIS — I5042 Chronic combined systolic (congestive) and diastolic (congestive) heart failure: Secondary | ICD-10-CM | POA: Diagnosis not present

## 2021-08-10 DIAGNOSIS — S81802A Unspecified open wound, left lower leg, initial encounter: Secondary | ICD-10-CM | POA: Diagnosis not present

## 2021-08-10 LAB — BASIC METABOLIC PANEL
Anion gap: 10 (ref 5–15)
BUN: 20 mg/dL (ref 8–23)
CO2: 26 mmol/L (ref 22–32)
Calcium: 8.6 mg/dL — ABNORMAL LOW (ref 8.9–10.3)
Chloride: 102 mmol/L (ref 98–111)
Creatinine, Ser: 1.1 mg/dL (ref 0.61–1.24)
GFR, Estimated: >60 mL/min (ref >60)
Glucose, Bld: 172 mg/dL — ABNORMAL HIGH (ref 70–99)
Potassium: 3.4 mmol/L — ABNORMAL LOW (ref 3.5–5.1)
Sodium: 138 mmol/L (ref 135–145)

## 2021-08-10 LAB — CBC
HCT: 30.6 % — ABNORMAL LOW (ref 39.0–52.0)
Hemoglobin: 9.5 g/dL — ABNORMAL LOW (ref 13.0–17.0)
MCH: 27.6 pg (ref 26.0–34.0)
MCHC: 31 g/dL (ref 30.0–36.0)
MCV: 89 fL (ref 80.0–100.0)
Platelets: 288 10*3/uL (ref 150–400)
RBC: 3.44 MIL/uL — ABNORMAL LOW (ref 4.22–5.81)
RDW: 15.9 % — ABNORMAL HIGH (ref 11.5–15.5)
WBC: 9.4 10*3/uL (ref 4.0–10.5)
nRBC: 0 % (ref 0.0–0.2)

## 2021-08-10 LAB — RESP PANEL BY RT-PCR (FLU A&B, COVID) ARPGX2
Influenza A by PCR: NEGATIVE
Influenza B by PCR: NEGATIVE
SARS Coronavirus 2 by RT PCR: NEGATIVE

## 2021-08-10 LAB — GLUCOSE, CAPILLARY
Glucose-Capillary: 166 mg/dL — ABNORMAL HIGH (ref 70–99)
Glucose-Capillary: 199 mg/dL — ABNORMAL HIGH (ref 70–99)
Glucose-Capillary: 299 mg/dL — ABNORMAL HIGH (ref 70–99)
Glucose-Capillary: 197 mg/dL — ABNORMAL HIGH (ref 70–99)
Glucose-Capillary: 144 mg/dL — ABNORMAL HIGH (ref 70–99)

## 2021-08-10 MED ORDER — METOPROLOL TARTRATE 25 MG PO TABS: 25.0000 mg | ORAL_TABLET | Freq: Two times a day (BID) | ORAL | Status: DC

## 2021-08-10 MED ORDER — FUROSEMIDE 40 MG PO TABS
40.0000 mg | ORAL_TABLET | Freq: Every day | ORAL | Status: DC
  Administered 2021-08-11: 40 mg via ORAL
  Filled 2021-08-10: qty 1

## 2021-08-10 MED ORDER — POTASSIUM CHLORIDE CRYS ER 20 MEQ PO TBCR
40.0000 meq | EXTENDED_RELEASE_TABLET | Freq: Two times a day (BID) | ORAL | Status: DC
  Filled 2021-08-10: qty 2

## 2021-08-10 MED ORDER — METOPROLOL TARTRATE 25 MG PO TABS
25.0000 mg | ORAL_TABLET | Freq: Two times a day (BID) | ORAL | Status: DC
  Administered 2021-08-10: 25 mg
  Filled 2021-08-10: qty 1

## 2021-08-10 MED ORDER — METOPROLOL TARTRATE 50 MG PO TABS
50.0000 mg | ORAL_TABLET | Freq: Two times a day (BID) | ORAL | Status: DC
  Administered 2021-08-10: 50 mg
  Filled 2021-08-10 (×2): qty 1

## 2021-08-10 MED ORDER — POTASSIUM CHLORIDE 20 MEQ PO PACK
40.0000 meq | PACK | Freq: Four times a day (QID) | ORAL | Status: AC
  Administered 2021-08-10 – 2021-08-11 (×2): 40 meq
  Filled 2021-08-10 (×2): qty 2

## 2021-08-10 NOTE — Progress Notes (Signed)
Mobility Specialist: Progress Note ? ? 08/10/21 1702  ?Mobility  ?Activity Transferred from chair to bed  ?Level of Assistance Total care  ?Assistive Device MaxiMove  ?Activity Response Tolerated well  ?$Mobility charge 1 Mobility  ? ?Pt assisted back to bed per request with no c/o during transfer. Call bell and phone at his side. NT present in the room to assist him with his dinner.  ? ?Harrell Gave Ethell Blatchford ?Mobility Specialist ?Mobility Specialist [place]: [phone] ?Mobility Specialist [place]: [phone] ? ?

## 2021-08-10 NOTE — Progress Notes (Signed)
?   08/10/21 9179  ?Assess: MEWS Score  ?Temp 98.4 ?F (36.9 ?C)  ?BP 130/63  ?Pulse Rate (!) 124  ?ECG Heart Rate (!) 133  ?Resp 20  ?SpO2 96 %  ?O2 Device Room Air  ?Assess: MEWS Score  ?MEWS Temp 0  ?MEWS Systolic 0  ?MEWS Pulse 3  ?MEWS RR 0  ?MEWS LOC 0  ?MEWS Score 3  ?MEWS Score Color Yellow  ?Assess: if the MEWS score is Yellow or Red  ?Were vital signs taken at a resting state? Yes  ?Focused Assessment No change from prior assessment  ?Early Detection of Sepsis Score *See Row Information* Low  ?MEWS guidelines implemented *See Row Information* Yes  ?Take Vital Signs  ?Increase Vital Sign Frequency  Yellow: Q 2hr X 2 then Q 4hr X 2, if remains yellow, continue Q 4hrs  ?Escalate  ?MEWS: Escalate Yellow: discuss with charge nurse/RN and consider discussing with provider and RRT  ?Notify: Charge Nurse/RN  ?Name of Charge Nurse/RN Notified AJ RN  ?Date Charge Nurse/RN Notified 08/10/21  ?Time Charge Nurse/RN Notified [phone]  ?Notify: Provider  ?Provider Name/Title Flora Lipps MD  ?Date Provider Notified 08/10/21  ?Time Provider Notified [phone]  ?Notification Type Face-to-face  ?Notification Reason Other (Comment) ?(elevated HR)  ?Provider response See new orders  ?Date of Provider Response 08/10/21  ?Time of Provider Response [phone]  ?Document  ?Patient Outcome Stabilized after interventions  ?Progress note created (see row info) Yes  ? ? ?

## 2021-08-10 NOTE — Progress Notes (Signed)
Physical Therapy Treatment ?Patient Details ?Name: Tony Young ?MRN: [id number] ?DOB: [birth year] ?Today's Date: 08/10/2021 ? ? ?History of Present Illness Pt is a 66 y.o. male originally admitted to [place] 05/13/21 with d/c to [facility] 06/10/21, readmission then transfer to CIR on 2/15; pt now admitted 07/29/21 for LLE and sacral wound debridement and skin graft. PMH includes CHF, COPD, CAD, DM, trach, peg. ? ?  ?PT Comments  ? ? Pt was seen for progression of mobility but was already OOB in chair.  He was assisted to do there ex on BLE"s with care to avoid stressing skin on legs or sacrum.  He is anticipated to get to SNF as planned due to skin issues and generalized weakness and dependent transfers.  Follow for acute PT goals.  ?Recommendations for follow up therapy are one component of a multi-disciplinary discharge planning process, led by the attending physician.  Recommendations may be updated based on patient status, additional functional criteria and insurance authorization. ? ?Follow Up Recommendations ? Skilled nursing-short term rehab (<3 hours/day) ?  ?  ?Assistance Recommended at Discharge Intermittent Supervision/Assistance  ?Patient can return home with the following Two people to help with walking and/or transfers;Two people to help with bathing/dressing/bathroom;Assistance with cooking/housework;Assist for transportation;Help with stairs or ramp for entrance;Assistance with feeding ?  ?Equipment Recommendations ? Wheelchair (measurements PT);Wheelchair cushion (measurements PT);Hospital bed;Other (comment)  ?  ?Recommendations for Other Services   ? ? ?  ?Precautions / Restrictions Precautions ?Precautions: Fall;Other (comment) ?Precaution Comments: trach, PEG, fragile skin; LE/sacral wound drainage ?Restrictions ?Weight Bearing Restrictions: Yes ?Other Position/Activity Restrictions: pt is unable to stand up on BLE"s  ?  ? ?Mobility ? Bed Mobility ?Overal bed mobility: Needs Assistance ?  ?  ?   ?  ?  ?  ?General bed mobility comments: up in chair ?  ? ?Transfers ?  ?  ?  ?  ?  ?  ?  ?  ?  ?  ?  ? ?Ambulation/Gait ?  ?  ?  ?  ?  ?  ?  ?  ? ? ?Stairs ?  ?  ?  ?  ?  ? ? ?Wheelchair Mobility ?  ? ?Modified Rankin (Stroke Patients Only) ?  ? ? ?  ?Balance Overall balance assessment: Needs assistance ?Sitting-balance support: Feet supported ?Sitting balance-Leahy Scale: Poor ?  ?  ?  ?  ?  ?  ?  ?  ?  ?  ?  ?  ?  ?  ?  ?  ?  ? ?  ?Cognition Arousal/Alertness: Awake/alert ?Behavior During Therapy: [facility] for tasks assessed/performed ?Overall Cognitive Status: Within Functional Limits for tasks assessed ?  ?  ?  ?  ?  ?  ?  ?  ?  ?  ?  ?  ?  ?  ?  ?  ?  ?  ?  ? ?  ?Exercises General Exercises - Lower Extremity ?Ankle Circles/Pumps: AROM, 5 reps ?Gluteal Sets: AROM, 10 reps ?Long Arc Quad: AAROM, 10 reps ?Heel Slides: AAROM, 10 reps ? ?  ?General Comments General comments (skin integrity, edema, etc.): pt has mult skin wounds including LLE and sacrum, painful and requires touch avoidance ?  ?  ? ?Pertinent Vitals/Pain Pain Assessment ?Pain Assessment: Faces ?Faces Pain Scale: Hurts little more ?Breathing: normal ?Negative Vocalization: none ?Facial Expression: smiling or inexpressive ?Body Language: relaxed ?Consolability: no need to console ?PAINAD Score: 0 ?Pain Location: LLE and sacrum ?Pain Descriptors / Indicators: Discomfort,  Grimacing ?Pain Intervention(s): Limited activity within patient's tolerance, Monitored during session, Premedicated before session, Repositioned  ? ? ?Home Living   ?  ?  ?  ?  ?  ?  ?  ?  ?  ?   ?  ?Prior Function    ?  ?  ?   ? ?PT Goals (current goals can now be found in the care plan section)   ? ?  ?Frequency ? ? ? Min 2X/week ? ? ? ?  ?PT Plan Current plan remains appropriate  ? ? ?Co-evaluation   ?  ?  ?  ?  ? ?  ?AM-PAC PT "6 Clicks" Mobility   ?Outcome Measure ? Help needed turning from your back to your side while in a flat bed without using bedrails?: A Lot ?Help needed moving  from lying on your back to sitting on the side of a flat bed without using bedrails?: Total ?Help needed moving to and from a bed to a chair (including a wheelchair)?: Total ?Help needed standing up from a chair using your arms (e.g., wheelchair or bedside chair)?: Total ?Help needed to walk in hospital room?: Total ?Help needed climbing 3-5 steps with a railing? : Total ?6 Click Score: 7 ? ?  ?End of Session   ?Activity Tolerance: Patient tolerated treatment well ?Patient left: in chair;with call bell/phone within reach;with chair alarm set ?Nurse Communication: Mobility status;Need for lift equipment ?PT Visit Diagnosis: Other abnormalities of gait and mobility (R26.89);Muscle weakness (generalized) (M62.81);Pain ?  ? ? ?Time: 0626-9485 ?PT Time Calculation (min) (ACUTE ONLY): 18 min ? ?Charges:  $Therapeutic Exercise: 8-22 mins       ?Ramond Dial ?08/10/2021, 8:01 PM ? ?Mee Hives, PT PhD ?Acute Rehab Dept. Number: [facility] [phone] and [place] [phone] ? ? ?

## 2021-08-10 NOTE — Progress Notes (Signed)
?PROGRESS NOTE ? ? ? ?Tony Young  LOV:[id number] DOB: 07/16/1955 DOA: 07/29/2021 ?PCP: Rosaria Ferries, MD  ? ? ?Brief Narrative:  ?Tony Young is a 66 y.o. male with past medical history of obesity, obstructive sleep apnea, diabetes mellitus type 2, coronary artery disease, COPD, colon cancer who was recently admitted to [facility] in [place] for congestive heart failure on December 2022.  At that time patient had developed septic shock due to necrotizing fasciitis of the left lower extremity  with strep pyogenes bacteremia.  He was intubated for respiratory failure, required pressors, subsequently developed acute renal failure he required multiple I&D of left lower extremity.  He required CRRT and was transitioned to dialysis when stable.  He developed DVT and was started on heparin drip but that had to be stopped because of GI bleed.  Patient did have EGD on 12/24 showed gastric ulcer and required multiple units PRBCs.  On 12/27, he underwent trach/PEG.  [facility] for further management. While at [facility], he had pneumonia, COPD exacerbation, bouts of A-fib.  His respiratory status improved and he was liberated off the ventilator to oxygen by trach collar on 1/25.  He had issues with overload but was not able to aggressively diurese because of recurrent AKI.  He was admitted to CIR on 2/15 because of functional decline.  Per CIR note, patient was not able to participate with them as expected. Throughout the course of his illness since January, he was bedridden and sustained sacral ulcer.  While at rehabilitation, patient was seen by plastic surgery who recommended I&D of the left leg and sacral wound.  Patient underwent debridement of the left leg wound with skin grafting on 07/29/2021.  Plastic surgery following the patient during hospitalization.  Currently, patient is extremely debilitated and weak and is currently awaiting for skilled nursing  facility placement. ?  ?  ?Assessment and Plan: ?* Open leg wound ?History of necrotizing fasciitis  ?Patient has had multiple I&D's in the past.  Underwent leg wound debridement and skin grafting by plastic surgery on 07/29/2021.   Not on antibiotic at this time. Followed by plastic surgery Dr. Erin Hearing.  Wound care on board for dressing will need to continue on discharge. ? 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  Status post tracheostomy 05/26/2021.  PCCM and respiratory therapy on board. Currently appears stable. ? ?Open wound of left buttock ?Sacral pressure ulceration. ?Present on admission.  Patient underwent debridement and skin grafting on 07/29/2021 by plastic surgery.  Continue dressing as per plastic surgery.. ? ?Chronic combined systolic and diastolic CHF (congestive heart failure) ([place]) ?Appears compensated at this time.  2D echocardiogram 2/22 showed LV ejection fraction of 40 to 45%.  Myoview was negative for ischemia.  Patient has been on IV Lasix with holding parameters at this time.   Latest creatinine of 1.1 with BUN of 20.  Negative balance for 7591 ml.  Monitor BMP.  We will transition to oral Lasix on discharge. ? ?Paroxysmal atrial fibrillation with RVR ([place]) ?With episodes of RVR.  On amiodarone, Eliquis and metoprolol.  Increase the dose of metoprolol from 12.5-25 twice daily starting today.  Blood pressure overall improved.  On DuoNebs every 4 hourly from 2 hourly. ? ?Controlled type 2 diabetes mellitus with hyperglycemia, with long-term current use of insulin ([place]) ?With hyperglycemia.  Hemoglobin A1c of 5.9 on 07/29/2021.  Continue Lantus, sliding scale insulin.  Now controlled. ? ?Hypokalemia ?  Mildly low at 3.4 on diuretics.  We will continue oral potassium supplements.  Check levels in a.m. ? ?Anemia due to chronic kidney disease ?Recent history of GI bleed.  ? Baseline hemoglobin of around 9.0.  Continue to monitor.  Patient had EGD  05/23/2021 which showed a gastric ulcer and required multiple units of PRBC.  Continue iron and PPI.  Latest hemoglobin of 9.5.  No mention of further bleed. ? ?Dvt femoral (deep venous thrombosis) ([place]) ?Patient did have a history of extensive DVT in the right lower extremity on 07/17/21 is on Eliquis at this time.  Hemoglobin is stable ? ?Anxiety and depression ?Patient was getting buspirone, modafinil, Lyrica Zoloft and risperidone, primidone at the CIR. currently on buspirone and sertraline. ? ?CKD stage 2 due to type 2 diabetes mellitus ([place]) ?Continue to monitor creatinine.  Latest creatinine at 1.1`. ? ?OSA (obstructive sleep apnea) ?On tracheostomy collar at this time. ? ?Debility ?Deconditioning.  History of critical illness myopathy necrotizing fasciitis prolonged hospitalization with respiratory failure.  Was in CIR 2/15 to 2-28.  Awaiting for skilled nursing facility on discharge. ? ? ? DVT prophylaxis: SCD's Start: 07/29/21 2017 ?apixaban (ELIQUIS) tablet 5 mg  ? ?Code Status:   ?  Code Status: Full Code ? ?Disposition: Skilled nursing facility likely in 1 to 2 days. ? ?Status is: Inpatient ? ?Remains inpatient appropriate because:  need for placement ? ? Family Communication:  ? Spoke with the patient's daughter on the phone on 08/09/21 and updated her about the clinical condition of the patient ? ?Consultants:  ?Plastic surgery ?Palliative care ? ?Procedures:  ?Left lower extremity debridement and skin grafting on 07/29/2021 by plastic surgery.   ?Debridement of the left sacral stage III pressure ulceration on 07/29/2021. ? ?Antimicrobials:  ?None ? ?Anti-infectives (From admission, onward)  ? ? None  ? ?  ? ?Subjective: ? ?Today, she was seen and examined at bedside.  Denies any chest pain, shortness of breath, fever, chills or rigor.  Has generalized weakness.  Staff reported elevated heart rate. ? ?Objective: ?Vitals:  ? 08/10/21 0822 08/10/21 1115 08/10/21 1200 08/10/21 1330  ?BP: 130/63 102/68 102/68  112/68  ?Pulse: (!) 124 (!) 114 (!) 110 (!) 105  ?Resp: '20 20 19 18  '$ ?Temp: 98.4 ?F (36.9 ?C) 98.4 ?F (36.9 ?C)  98 ?F (36.7 ?C)  ?TempSrc: Oral Oral  Oral  ?SpO2: 96% 95%  94%  ? ? ?Intake/Output Summary (Last 24 hours) at 08/10/2021 1421 ?Last data filed at 08/09/2021 2300 ?Gross per 24 hour  ?Intake 400 ml  ?Output --  ?Net 400 ml  ? ?There were no vitals filed for this visit. ? ?There is no height or weight on file to calculate BMI.  ? ?Physical Examination: ? ?General: Obese built, not in obvious distress chronically ill and deconditioned ?HENT:   No scleral pallor or icterus noted. Oral mucosa is moist.  ?Chest: .  Diminished breath sounds bilaterally. No crackles or wheezes.  ?CVS: S1 &S2 heard. No murmur.  Irregularly irregular rhythm with tachycardia ?Abdomen: Soft, nontender, nondistended.  Bowel sounds are heard.   ?Extremities: No cyanosis, clubbing or edema.  Peripheral pulses are palpable.  Left lower extremity with thigh wound with ?Psych: Alert, awake and oriented, normal mood ?CNS:  No cranial nerve deficits.  Denies weakness of the extremities.  Intentional tremors noted in the upper extremities ?Skin: Warm and dry.  Left thigh wound/decubitus ulceration. ? ?Data Reviewed:  ? ?CBC: ?Recent Labs  ?Lab 08/04/21 ?1914  08/05/21 ?2993 08/07/21 ?0357 08/08/21 ?0330 08/10/21 ?0430  ?WBC 6.3 7.2 7.3 8.1 9.4  ?HGB 11.3* 9.7* 9.7* 9.0* 9.5*  ?HCT 36.8* 32.2* 31.8* 29.4* 30.6*  ?MCV 90.2 90.7 90.9 90.2 89.0  ?PLT 180 218 203 237 288  ? ? ?Basic Metabolic Panel: ?Recent Labs  ?Lab 08/04/21 ?7169 08/05/21 ?6789 08/07/21 ?3810 08/08/21 ?0330 08/10/21 ?0430  ?NA 139 135 133* 136 138  ?K 3.4* 3.9 3.8 3.6 3.4*  ?CL 104 102 102 102 102  ?CO2 24 23 21* 25 26  ?GLUCOSE 100* 100* 130* 118* 172*  ?BUN '13 16 20 19 20  '$ ?CREATININE 1.21 1.27* 1.25* 1.13 1.10  ?CALCIUM 8.5* 8.1* 8.3* 8.4* 8.6*  ?MG  --  2.0  --   --   --   ?PHOS  --  2.9  --   --   --   ? ? ?Liver Function Tests: ?No results for input(s): AST, ALT, ALKPHOS,  BILITOT, PROT, ALBUMIN in the last 168 hours. ? ? ? ?Radiology Studies: ?No results found. ? ? ? LOS: 12 days  ? ? ?Flora Lipps, MD ?[facility] ?08/10/2021, 2:21 PM  ?  ?

## 2021-08-10 NOTE — Plan of Care (Signed)

## 2021-08-10 NOTE — Plan of Care (Signed)

## 2021-08-10 NOTE — TOC Progression Note (Addendum)
Transition of Care (TOC) - Progression Note  ? ? ?Patient Details  ?Name: Tony Young ?MRN: [id number] ?Date of Birth: 1955/11/05 ? ?Transition of Care (TOC) CM/SW Contact  ?Joanne Chars, LCSW ?Phone Number: ?08/10/2021, 10:33 AM ? ?Clinical Narrative:   CSW spoke with daughter Tony Young about transportation need to [place].  Daughter said the family cannot afford to pay out of pocket for ambulance transport.  CSW messaged MD who does not think private car transport would be appropriate for this pt.  CSW spoke with daughter again and she would want to pursue SNF within the range of transportation covered by insurance. (50 miles)   ? ?CSW received call from [place] at [facility] in [place].  They have had an outbreak of illness among staff, are no longer able to accept this pt as new admit today. ? ?Referral sent out to local SNF. ? ?1300: daughter asking about sister facility to [facility] in [place], facility was ID as [facility] and [place], [phone].  Referral faxed, CSW spoke with Tony Young in admissions and she has reviewed the referral, is waiting for approval from her DON due to the trach.  She asked if pt is being suctioned, spoke to RN who said no, also found respiratory note saying no, this was passed on.  CSW spoke with daughter Tony Young who would like to move forward with this facility if bed is offered.   ? ?Expected Discharge Plan: [place] ?Barriers to Discharge: Continued Medical Work up, Other (must enter comment), SNF Pending bed offer (trach patient) ? ?Expected Discharge Plan and Services ?Expected Discharge Plan: [place] ?In-house Referral: Clinical Social Work ?  ?Post Acute Care Choice: [place] ?Living arrangements for the past 2 months: [place] ?                ?  ?  ?  ?  ?  ?  ?  ?  ?  ?  ? ? ?Social Determinants of Health (SDOH) Interventions ?  ? ?Readmission Risk Interventions ?No flowsheet data found. ? ?

## 2021-08-10 NOTE — Progress Notes (Signed)
Mobility Specialist: Progress Note ? ? 08/10/21 1558  ?Mobility  ?Activity Transferred from bed to chair  ?Level of Assistance Total care  ?Assistive Device MaxiMove  ?Activity Response Tolerated well  ?$Mobility charge 1 Mobility  ? ?Pt assisted to the recliner with no c/o during transfer. Encouraged pt to sit up for one hour. Will f/u to transfer pt back to bed. Call bell and phone in reach. ? ?Tony Young ?Mobility Specialist ?Mobility Specialist [place]: [phone] ?Mobility Specialist [place]: [phone] ? ?

## 2021-08-11 DIAGNOSIS — F419 Anxiety disorder, unspecified: Secondary | ICD-10-CM | POA: Diagnosis not present

## 2021-08-11 DIAGNOSIS — N1831 Chronic kidney disease, stage 3a: Secondary | ICD-10-CM | POA: Diagnosis not present

## 2021-08-11 DIAGNOSIS — I5042 Chronic combined systolic (congestive) and diastolic (congestive) heart failure: Secondary | ICD-10-CM | POA: Diagnosis not present

## 2021-08-11 DIAGNOSIS — S81802A Unspecified open wound, left lower leg, initial encounter: Secondary | ICD-10-CM | POA: Diagnosis not present

## 2021-08-11 LAB — BASIC METABOLIC PANEL
Anion gap: 11 (ref 5–15)
BUN: 18 mg/dL (ref 8–23)
CO2: 21 mmol/L — ABNORMAL LOW (ref 22–32)
Calcium: 8.4 mg/dL — ABNORMAL LOW (ref 8.9–10.3)
Chloride: 105 mmol/L (ref 98–111)
Creatinine, Ser: 1.11 mg/dL (ref 0.61–1.24)
GFR, Estimated: >60 mL/min (ref >60)
Glucose, Bld: 138 mg/dL — ABNORMAL HIGH (ref 70–99)
Potassium: 4.5 mmol/L (ref 3.5–5.1)
Sodium: 137 mmol/L (ref 135–145)

## 2021-08-11 LAB — GLUCOSE, CAPILLARY
Glucose-Capillary: 143 mg/dL — ABNORMAL HIGH (ref 70–99)
Glucose-Capillary: 159 mg/dL — ABNORMAL HIGH (ref 70–99)
Glucose-Capillary: 222 mg/dL — ABNORMAL HIGH (ref 70–99)

## 2021-08-11 LAB — CBC
HCT: 30.1 % — ABNORMAL LOW (ref 39.0–52.0)
Hemoglobin: 9.2 g/dL — ABNORMAL LOW (ref 13.0–17.0)
MCH: 27.4 pg (ref 26.0–34.0)
MCHC: 30.6 g/dL (ref 30.0–36.0)
MCV: 89.6 fL (ref 80.0–100.0)
Platelets: 272 10*3/uL (ref 150–400)
RBC: 3.36 MIL/uL — ABNORMAL LOW (ref 4.22–5.81)
RDW: 16 % — ABNORMAL HIGH (ref 11.5–15.5)
WBC: 10.3 10*3/uL (ref 4.0–10.5)
nRBC: 0 % (ref 0.0–0.2)

## 2021-08-11 LAB — MAGNESIUM: Magnesium: 1.8 mg/dL (ref 1.7–2.4)

## 2021-08-11 MED ORDER — ALBUTEROL SULFATE (2.5 MG/3ML) 0.083% IN NEBU
2.5000 mg | INHALATION_SOLUTION | Freq: Four times a day (QID) | RESPIRATORY_TRACT | Status: AC | PRN
Start: 2021-08-11 — End: ?

## 2021-08-11 MED ORDER — OXYCODONE HCL 5 MG PO TABS: 5.0000 mg | ORAL_TABLET | ORAL | 0 refills | Status: AC | PRN

## 2021-08-11 MED ORDER — INSULIN GLARGINE 100 UNIT/ML ~~LOC~~ SOLN: 15.0000 [IU] | Freq: Every day | SUBCUTANEOUS | Status: AC

## 2021-08-11 MED ORDER — APIXABAN 5 MG PO TABS: 5.0000 mg | ORAL_TABLET | Freq: Two times a day (BID) | ORAL | Status: AC

## 2021-08-11 MED ORDER — ZINC SULFATE 220 (50 ZN) MG PO CAPS: 220.0000 mg | ORAL_CAPSULE | Freq: Every day | ORAL | Status: AC

## 2021-08-11 MED ORDER — CHLORHEXIDINE GLUCONATE 0.12 % MT SOLN: 15.0000 mL | Freq: Two times a day (BID) | OROMUCOSAL | 0 refills | Status: AC

## 2021-08-11 MED ORDER — PREDNISONE 10 MG PO TABS: 10.0000 mg | ORAL_TABLET | Freq: Every day | ORAL | Status: AC

## 2021-08-11 MED ORDER — ENSURE MAX PROTEIN PO LIQD: 11.0000 [oz_av] | Freq: Two times a day (BID) | ORAL | Status: AC

## 2021-08-11 MED ORDER — METOPROLOL TARTRATE 25 MG PO TABS
25.0000 mg | ORAL_TABLET | Freq: Two times a day (BID) | ORAL | Status: DC
  Administered 2021-08-11: 25 mg

## 2021-08-11 MED ORDER — DOCUSATE SODIUM 50 MG/5ML PO LIQD: 100.0000 mg | Freq: Two times a day (BID) | ORAL | Status: AC

## 2021-08-11 MED ORDER — ACETAMINOPHEN 160 MG/5ML PO SOLN: 650.0000 mg | Freq: Four times a day (QID) | ORAL | Status: AC | PRN

## 2021-08-11 MED ORDER — ONDANSETRON HCL 4 MG PO TABS: 4.0000 mg | ORAL_TABLET | Freq: Four times a day (QID) | ORAL | Status: AC | PRN

## 2021-08-11 MED ORDER — FUROSEMIDE 20 MG PO TABS: 40.0000 mg | ORAL_TABLET | Freq: Every morning | ORAL | Status: AC

## 2021-08-11 MED ORDER — METOPROLOL TARTRATE 25 MG PO TABS: 25.0000 mg | ORAL_TABLET | Freq: Two times a day (BID) | ORAL | Status: AC

## 2021-08-11 NOTE — TOC Transition Note (Signed)
Transition of Care (TOC) - CM/SW Discharge Note ? ? ?Patient Details  ?Name: Tony Young ?MRN: [id number] ?Date of Birth: 12/10/1955 ? ?Transition of Care [facility]) CM/SW Contact:  ?Joanne Chars, LCSW ?Phone Number: ?08/11/2021, 10:39 AM ? ? ?Clinical Narrative:   Pt discharging to [facility].  RN call report to [phone].   ? ? ? ?Final next level of care: [place] ?Barriers to Discharge: Barriers Resolved ? ? ?Patient Goals and CMS Choice ?Patient states their goals for this hospitalization and ongoing recovery are:: "get back on my harley" ?CMS Medicare.gov Compare Post Acute Care list provided to:: Patient Represenative (must comment) ?Choice offered to / list presented to : Adult Children (son Merry Proud) ? ?Discharge Placement ?  ?           ?Patient chooses bed at:  [facility]) ?Patient to be transferred to facility by: [facility] ?Name of family member notified: daughter Lovell Sheehan ?Patient and family notified of of transfer: 08/11/21 ? ?Discharge Plan and Services ?In-house Referral: Clinical Social Work ?  ?Post Acute Care Choice: [place]          ?  ?  ?  ?  ?  ?  ?  ?  ?  ?  ? ?Social Determinants of Health (SDOH) Interventions ?  ? ? ?Readmission Risk Interventions ?No flowsheet data found. ? ? ? ? ?

## 2021-08-11 NOTE — Discharge Summary (Signed)
?Physician Discharge Summary ?  ?Patient: Tony Young MRN: [id number] DOB: 1955-10-04  ?Admit date:     07/29/2021  ?Discharge date: 08/11/21  ?Discharge Physician: Corrie Mckusick Mohogany Toppins  ? ?PCP: Rosaria Ferries, MD  ? ?Recommendations at discharge:  ? ?Follow-up with your primary care provider at the skilled nursing facility in 3 to 5 days. ?Monitor CBC BMP magnesium and LFT in the next visit ?Please address pain and anxiety issues which causes him to have tachycardia.  Limit bronchodilators.  Heart rate goal 110 or less. ?Continues thigh/sacral wound care/tracheostomy care. ?Follow-up with Dr. Erin Hearing plastic surgery for wound follow-up ? ?Discharge Diagnoses: ?Principal Problem: ?  Open leg wound ?Active Problems: ?  Tracheostomy status ([place]) ?  Open wound of left buttock ?  Chronic combined systolic and diastolic CHF (congestive heart failure) ([place]) ?  Paroxysmal atrial fibrillation with RVR ([place]) ?  Controlled type 2 diabetes mellitus with hyperglycemia, with long-term current use of insulin ([place]) ?  Hypokalemia ?  Dvt femoral (deep venous thrombosis) ([place]) ?  Anemia due to chronic kidney disease ?  CKD stage 2 due to type 2 diabetes mellitus ([place]) ?  Anxiety and depression ?  OSA (obstructive sleep apnea) ?  Debility ? ?Resolved Problems: ?  * No resolved hospital problems. * ? ?Hospital Course: ?Tony Young is a 66 y.o. male with past medical history of obesity, obstructive sleep apnea, diabetes mellitus type 2, coronary artery disease, COPD, colon cancer who was recently admitted to [facility] in [place] for congestive heart failure on December 2022.  At that time patient had developed septic shock due to necrotizing fasciitis of the left lower extremity  with strep pyogenes bacteremia.  He was intubated for respiratory failure, required pressors, subsequently developed acute renal failure he required multiple I&D of left lower extremity.  He required CRRT and was transitioned to  dialysis when stable.  He developed DVT and was started on heparin drip but that had to be stopped because of GI bleed.  Patient did have EGD on 12/24 showed gastric ulcer and required multiple units PRBCs.  On 12/27, he underwent trach/PEG.  He was discharged to select LTAC on 06/10/2021 for further management. While at [facility], he had pneumonia, COPD exacerbation, bouts of A-fib.  His respiratory status improved and he was liberated off the ventilator to oxygen by trach collar on 1/25.  He had issues with overload but was not able to aggressively diurese because of recurrent AKI.  He was admitted to CIR on 2/15 because of functional decline.  Per rehabilitation note, patient was not able to participate with them as expected. Throughout the course of his illness since January, he was bedridden and sustained sacral ulcer.  While at rehabilitation, patient was seen by plastic surgery who recommended I&D of the left leg and sacral wound.  Patient underwent debridement of the left leg wound with skin grafting on 07/29/2021.  Plastic surgery following the patient during hospitalization.  Patient is extremely debilitated and weak and was considered  for skilled nursing facility placement. ? ? ?Assessment and Plan: ?* Open leg wound ?History of necrotizing fasciitis  ?Patient has had multiple I&D's in the past.  Underwent leg wound debridement and skin grafting by plastic surgery on 07/29/2021.   Not on antibiotic at this time. Followed by plastic surgery Dr. Erin Hearing.  Wound care followed the patient during hospitalization for dressing will need to continue on discharge. ? 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  Status post tracheostomy 05/26/2021.  Currently appears stable.  Continue trach care on discharge. ? ?Open wound of left buttock ?Sacral pressure ulceration. ?Present on admission.  Patient underwent debridement and skin grafting on 07/29/2021 by plastic surgery.   Continue dressing as advised by plastic surgery. ? ?Chronic combined systolic and diastolic CHF (congestive heart failure) ([place]) ?Appears compensated at this time.  2D echocardiogram 2/22 showed LV ejection fraction of 40 to 45%.  Myoview was negative for ischemia.  Patient initially received IV Lasix which has been changed to oral on discharge.  ? ?Paroxysmal atrial fibrillation with RVR ([place]) ?With episodes of RVR.  Patient does have episodes of RVR especially when he is in pain and is anxious.  Heart rate goal 110 or less.  On amiodarone, Eliquis and metoprolol.  Try to limit the DuoNeb's at the skilled nursing facility. ? ?Controlled type 2 diabetes mellitus with hyperglycemia, with long-term current use of insulin ([place]) ?  Hemoglobin A1c of 5.9 on 07/29/2021.  Continue Semglee, sliding scale insulin from home. ? ?Hypokalemia ?Improved at this time.  Latest potassium of 4.5.  Monitor.  Monitor periodically  ? ?Anemia due to chronic kidney disease ?Recent history of GI bleed.  ? Baseline hemoglobin of around 9.0.  Continue to monitor.  Patient had EGD 05/23/2021 which showed a gastric ulcer and required multiple units of PRBC.  Continue iron and PPI.  Latest hemoglobin of 9.2.  No mention of further bleed. ? ?Dvt femoral (deep venous thrombosis) ([place]) ?Patient did have a history of extensive DVT in the right lower extremity on 07/17/21 is on Eliquis at this time.  Hemoglobin has remained stable ? ?Anxiety and depression ?Patient was getting buspirone, modafinil, Lyrica Zoloft and risperidone, primidone at the CIR. we will discontinue primidone..  Will be continued on discharge. ? ?CKD stage 2 due to type 2 diabetes mellitus ([place]) ? Latest creatinine at 1.1.  This has remained stable during hospitalization. ? ?OSA (obstructive sleep apnea) ?On tracheostomy  at this time. ? ?Debility ?Deconditioning.  History of critical illness myopathy necrotizing fasciitis prolonged hospitalization with respiratory failure.  Was in  inpatient rehab during 2/15 to 2/28.  Currently appropriate for skilled nursing facility placement. ? ? ?Consultants:  ?Plastic surgery ?Palliative care ? ?Procedures performed:  ?Left lower extremity debridement and skin grafting on 07/29/2021 by plastic surgery.   ?Debridement of the left sacral stage III pressure ulceration on 07/29/2021.  ?Disposition: Skilled nursing facility ?Diet recommendation:  ?Discharge Diet Orders (From admission, onward)  ? ?  Start     Ordered  ? 08/11/21 0000  Diet - low sodium heart healthy       ?Comments: Mechanical soft  ? 08/11/21 0940  ? ?  ?  ? ?  ? ?Carb modified diet ?DISCHARGE MEDICATION: ?Allergies as of 08/11/2021   ? ?   Reactions  ? Statins Other (See Comments)  ? Made hands swell  ? Penicillins Nausea And Vomiting, Other (See Comments)  ? Other reaction(s): GI intolerance ?Other reaction(s): Nausea And Vomiting, Other (See Comments), Other (see comments) ?Other reaction(s): Other (See Comments) ?Other reaction(s): Nausea And Vomiting, Other (See Comments), Other (see comments) ?Other reaction(s): Nausea And Vomiting, Other (See Comments), Other (see comments)  ? Ropinirole Other (See Comments)  ? ?  ? ?  ?Medication List  ?  ? ?STOP taking these medications   ? ?fiber Pack packet ?  ?modafinil 100 MG tablet ?Commonly known as: PROVIGIL ?  ?nutrition supplement (JUVEN) Pack ?  Replaced by: Ensure Max Protein Liqd ?  ?potassium chloride 20 MEQ packet ?Commonly known as: KLOR-CON ?  ?primidone 50 MG tablet ?Commonly known as: MYSOLINE ?  ? ?  ? ?TAKE these medications   ? ?acetaminophen 160 MG/5ML solution ?Commonly known as: TYLENOL ?Place 20.3 mLs (650 mg total) into feeding tube every 6 (six) hours as needed for mild pain (or Fever >/= 101). ?  ?albuterol (2.5 MG/3ML) 0.083% nebulizer solution ?Commonly known as: PROVENTIL ?Take 3 mLs (2.5 mg total) by nebulization every 6 (six) hours as needed for wheezing or shortness of breath. ?  ?amantadine 100 MG capsule ?Commonly known  as: SYMMETREL ?Take 50 mg by mouth daily. ?  ?amiodarone 200 MG tablet ?Commonly known as: PACERONE ?Take 200 mg by mouth daily. ?  ?apixaban 5 MG Tabs tablet ?Commonly known as: ELIQUIS ?Take 1 tablet (5 mg

## 2021-08-11 NOTE — Care Management Important Message (Signed)
Important Message ? ?Patient Details  ?Name: Tony Young ?MRN: [id number] ?Date of Birth: 07/08/1955 ? ? ?Medicare Important Message Given:  Yes ? ? ? ? ?Levada Dy  Clara Smolen-Martin ?08/11/2021, 11:43 AM ?

## 2021-08-11 NOTE — Discharge Instructions (Signed)
Information on my medicine - ELIQUIS? (apixaban) ? ?Why was Eliquis? prescribed for you? ?Eliquis? was prescribed to treat blood clots that may have been found in the veins of your legs (deep vein thrombosis) and to reduce the risk of them occurring again. And also to reduce the risk of a blood clot forming that can cause a stroke if you have a medical condition called atrial fibrillation (a type of irregular heartbeat). ? ?What do You need to know about Eliquis? ? ?Your current dose is ONE 5 mg tablet taken TWICE daily.  Eliquis? may be taken with or without food.  ? ?Try to take the dose about the same time in the morning and in the evening. If you have difficulty swallowing the tablet whole please discuss with your pharmacist how to take the medication safely. ? ?Take Eliquis? exactly as prescribed and DO NOT stop taking Eliquis? without talking to the doctor who prescribed the medication.  Stopping may increase your risk of developing a new blood clot and may increase your risk of developing a stroke.  Refill your prescription before you run out. ? ?After discharge, you should have regular check-up appointments with your healthcare provider that is prescribing your Eliquis?. ?   ?What do you do if you miss a dose? ?If a dose of ELIQUIS? is not taken at the scheduled time, take it as soon as possible on the same day and twice-daily administration should be resumed. The dose should not be doubled to make up for a missed dose. ? ?Important Safety Information ?A possible side effect of Eliquis? is bleeding. You should call your healthcare provider right away if you experience any of the following: ?Bleeding from an injury or your nose that does not stop. ?Unusual colored urine (red or dark brown) or unusual colored stools (red or black). ?Unusual bruising for unknown reasons. ?A serious fall or if you hit your head (even if there is no bleeding). ? ?Some medicines may interact with Eliquis? and might increase your  risk of bleeding or clotting while on Eliquis?Marland Kitchen To help avoid this, consult your healthcare provider or pharmacist prior to using any new prescription or non-prescription medications, including herbals, vitamins, non-steroidal anti-inflammatory drugs (NSAIDs) and supplements. ? ?This website has more information on Eliquis? (apixaban): http://www.eliquis.com/eliquis/home  ? ? ? ? ? ? ?

## 2021-08-11 NOTE — Plan of Care (Signed)

## 2021-08-11 NOTE — Progress Notes (Signed)
?   08/11/21 1530  ?Clinical Encounter Type  ?Visited With Patient  ?Visit Type Initial;Social support  ?Referral From Chaplain  ?Consult/Referral To None  ? ?Chaplain visited patient to verify they had completed their Advanced Directive. The patient was expecting to be discharged today. So we visited for a bit about his life and family. He shared his joy in riding his motorcycle and his family. Tony Young is headed to rehabilitation and from there home. His goal is to ride again.  ? ?Danice Goltz ?Chaplain Resident  ?[facility]  ?[phone] ? ?

## 2021-08-17 ENCOUNTER — Ambulatory Visit: Payer: Medicare Other | Admitting: Plastic Surgery

## 2021-08-21 ENCOUNTER — Ambulatory Visit (INDEPENDENT_AMBULATORY_CARE_PROVIDER_SITE_OTHER): Payer: Medicare Other | Admitting: Plastic Surgery

## 2021-08-21 ENCOUNTER — Other Ambulatory Visit: Payer: Self-pay

## 2021-08-21 DIAGNOSIS — S81802D Unspecified open wound, left lower leg, subsequent encounter: Secondary | ICD-10-CM

## 2021-08-21 NOTE — Progress Notes (Signed)
Patient status post skin graft to left lower extremity on 07/29/2021.  Doing well without complaints ? ?Physical exam ?Donor site has healed.  Left lower leg graft is healed with staples intact ? ?Assessment and plan ?Doing well.  We will remove staples.  We can check the patient back again to make sure he continues to heal. ?

## 2021-09-28 ENCOUNTER — Ambulatory Visit: Payer: Medicare Other | Admitting: Cardiology

## 2021-10-05 ENCOUNTER — Encounter: Payer: Self-pay | Admitting: Cardiology

## 2021-10-23 ENCOUNTER — Ambulatory Visit: Payer: Medicare Other | Admitting: Plastic Surgery

## 2022-08-30 DEATH — deceased

## 2023-03-09 IMAGING — DX DG ABDOMEN 1V
1 series · 1 of 1 positions shown · non-contrast
Comparison: None.

CLINICAL DATA: Peg tube status.

EXAM:
ABDOMEN - 1 VIEW

[abdomen supine]
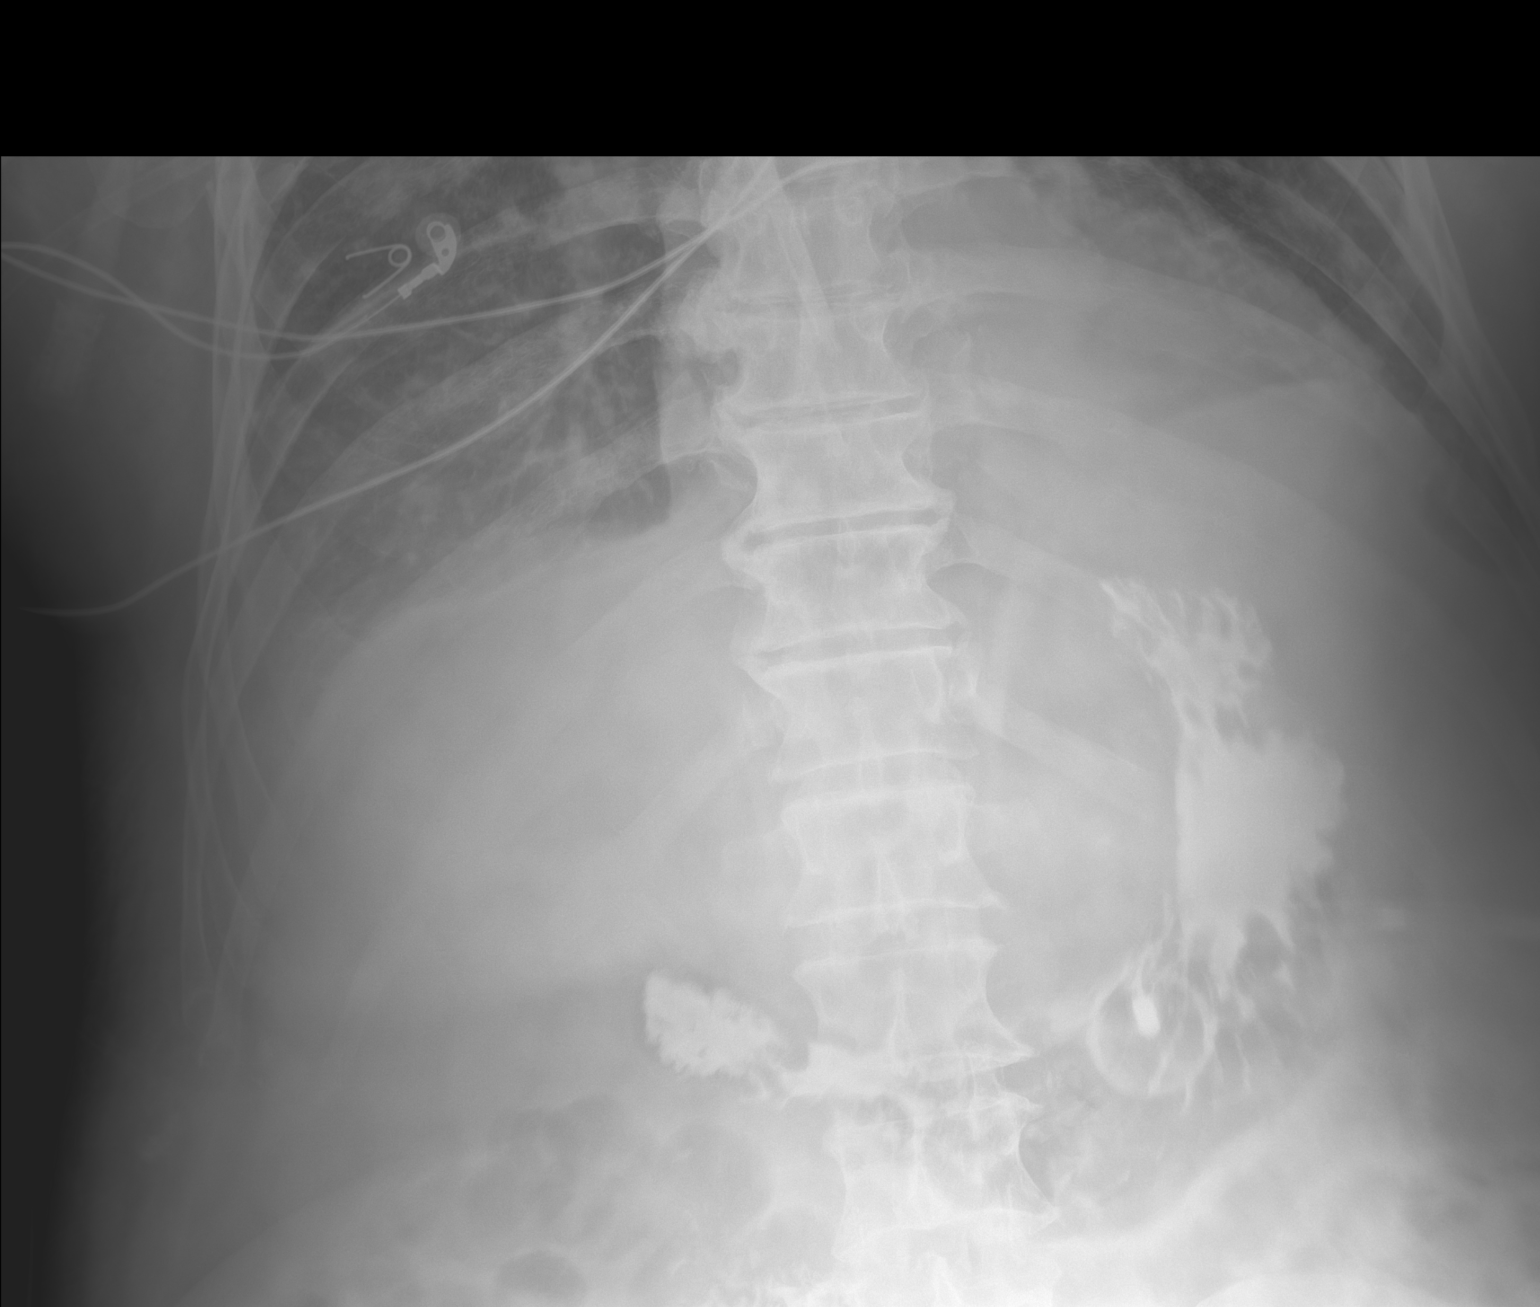

[1 of 1 positions shown; findings below may reference images not displayed]

FINDINGS: Portable supine view of the abdomen was obtained after the
installation of 30 cc Gastrografin via indwelling gastrostomy tube.
Contrast opacifies the stomach. There is no evidence of
extravasation or leak.
IMPRESSION: Gastrostomy tube in the stomach. No evidence of extravasation or
leak.

## 2023-03-09 IMAGING — DX DG CHEST 1V PORT
1 series · 1 of 1 positions shown · non-contrast
Comparison: None.

CLINICAL DATA: PEG tube check.

EXAM:
PORTABLE CHEST 1 VIEW

[chest ap]
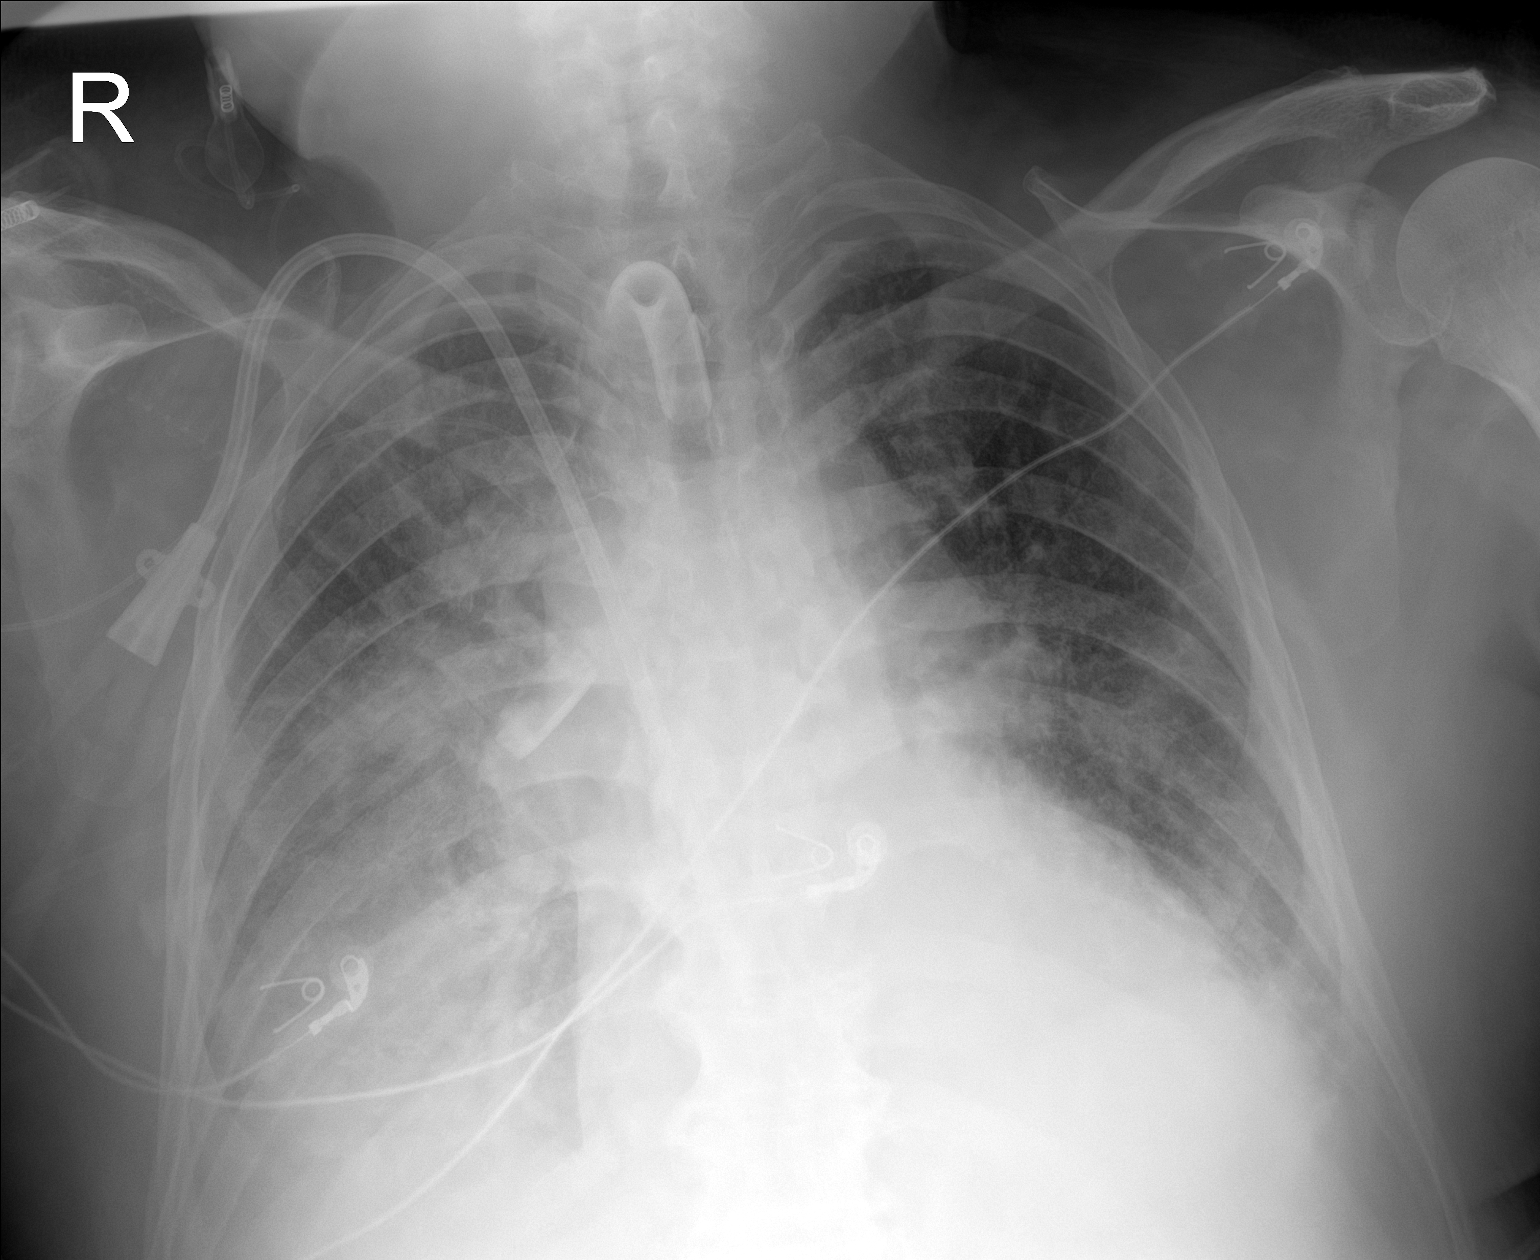

[1 of 1 positions shown; findings below may reference images not displayed]

FINDINGS: [DATE] p.m., 06/10/2021. A tracheostomy cannula has its tip 4.7 cm
from the carina.

There is a right IJ double-lumen catheter with its tip in the upper
right atrium.

The PEG tube is not within the imaged volume. Right PICC at least
reaches as far as the mid SVC but is obscured below that level due
to the overlying double-lumen catheter.

The heart is moderately enlarged. There is perihilar vascular
congestion and moderate perihilar edema, with alveolar infiltrates
extending from the hila into the mid and lower lung fields.

Dense patchy opacities in the lower lung fields could be due to
edema, atelectasis or superimposed pneumonia and are greater on the
right. There is a moderate right and small left layering pleural
effusions.

The apical [DATE] of the lungs are clear. Thoracic cage is intact with
thoracic spine spondylosis.
IMPRESSION: 1. Cardiomegaly with perihilar vascular congestion and moderate
central edema.
2. Perihilar and lower zonal opacities consistent with edema,
pneumonia, atelectasis or combination, with right greater than left
pleural effusions.
3. Support lines and tracheostomy cannula as above. PEG tube is not
included in the image.

## 2023-03-11 IMAGING — US IR CHEST US
1 series · 10 of 10 positions shown · non-contrast
Comparison: Chest radiograph from 06/10/2021

CLINICAL DATA: 65-year-old male with concern for pleural effusion.

EXAM:
CHEST ULTRASOUND

[Series 1: ir (person_name)/(person_name) · 10 of 10 slices shown]
[im 1/10]
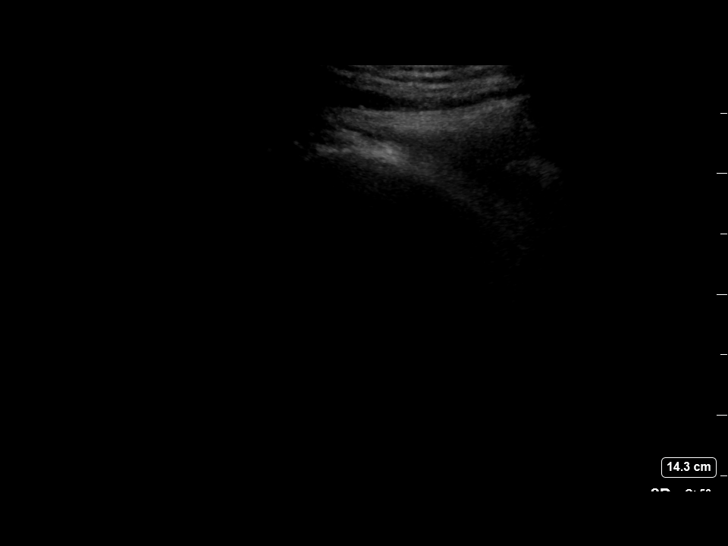
[im 2/10]
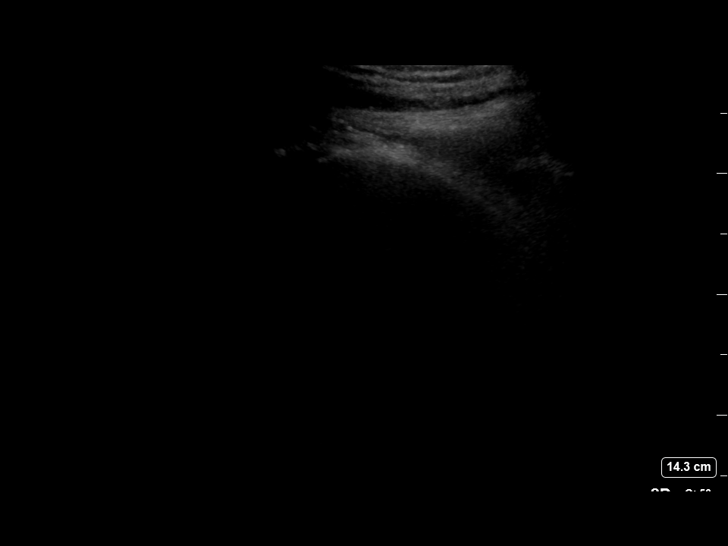
[im 3/10]
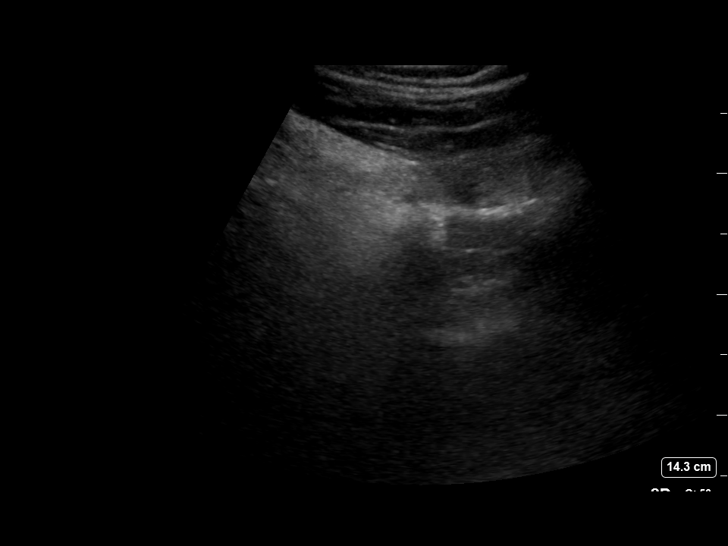
[im 4/10]
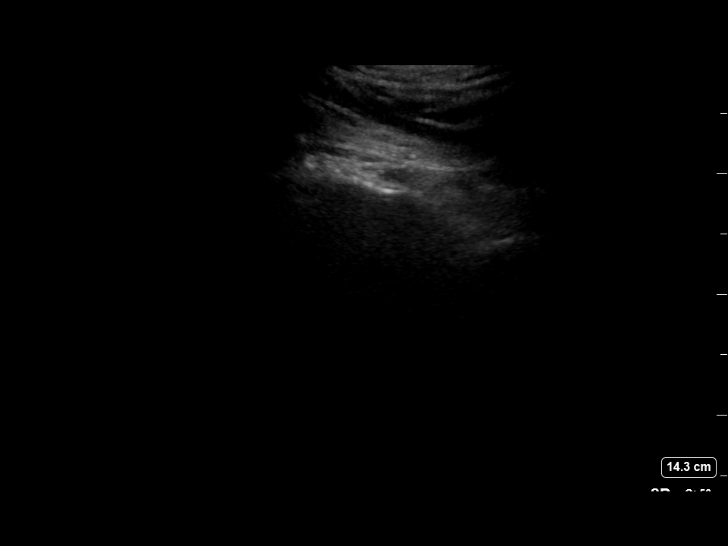
[im 5/10]
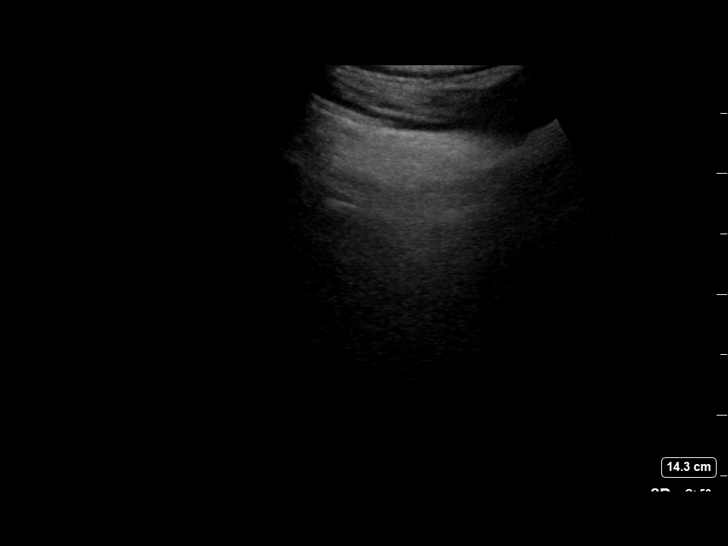
[im 6/10]
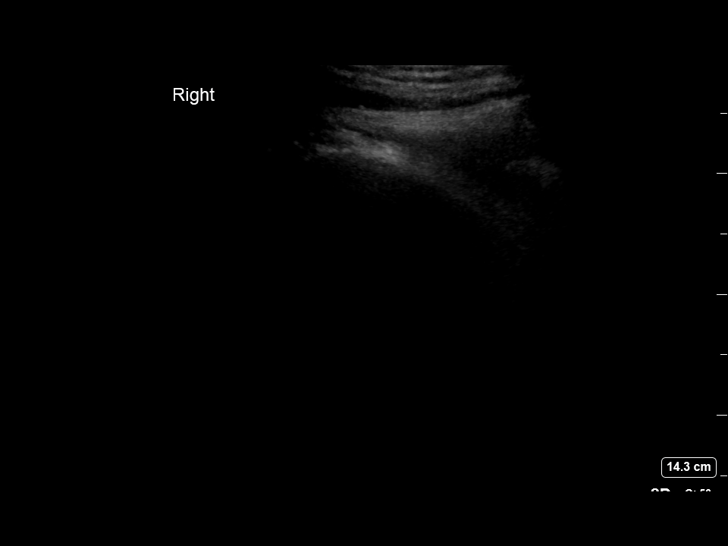
[im 7/10]
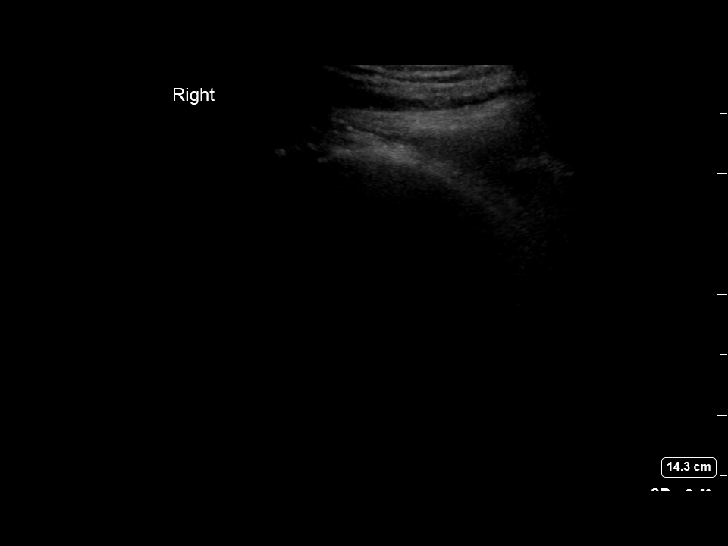
[im 8/10]
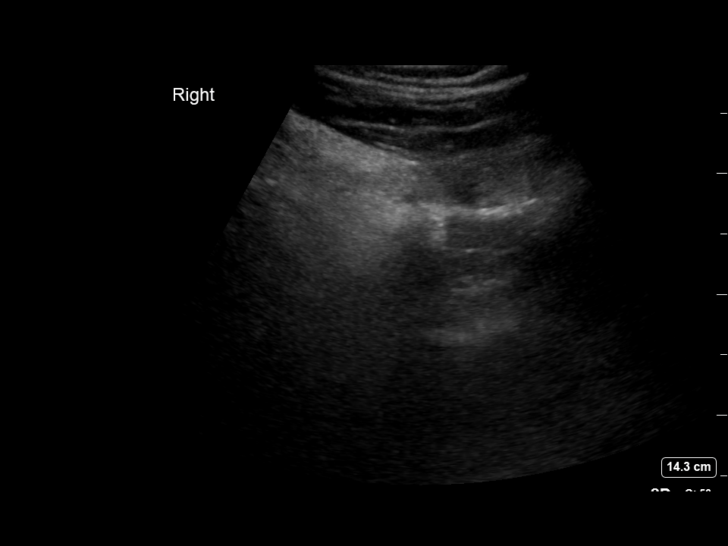
[im 9/10]
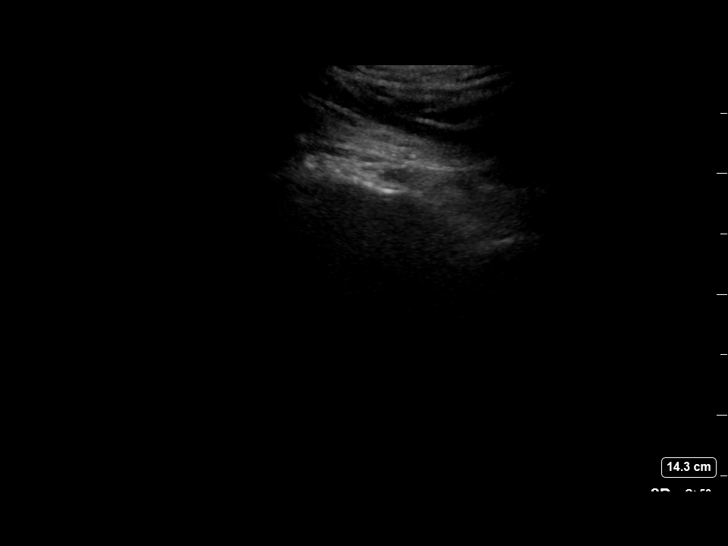
[im 10/10]
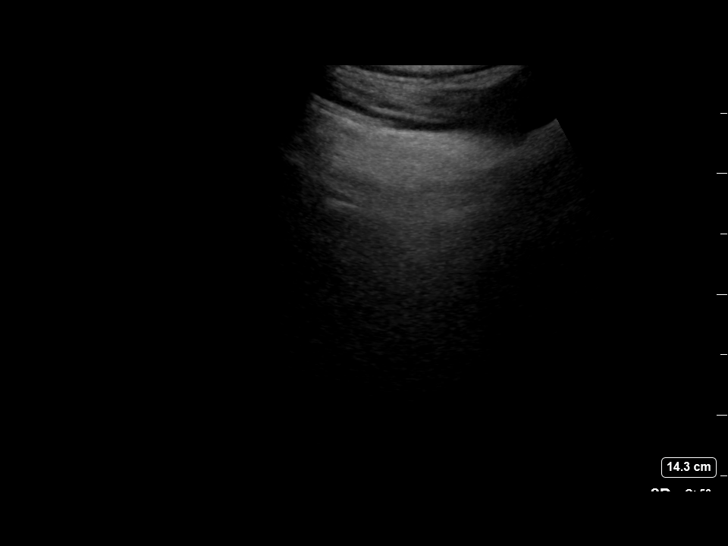

[10 of 10 positions shown; findings below may reference images not displayed]

FINDINGS: No evidence of significant pleural effusion bilaterally.
IMPRESSION: No evidence of significant pleural effusion bilaterally. No
thoracentesis was performed.

## 2023-03-13 IMAGING — DX DG CHEST 1V PORT
1 series · 1 of 1 positions shown · non-contrast
Comparison: Four days ago

CLINICAL DATA: Shortness of breath

EXAM:
PORTABLE CHEST 1 VIEW

[chest]
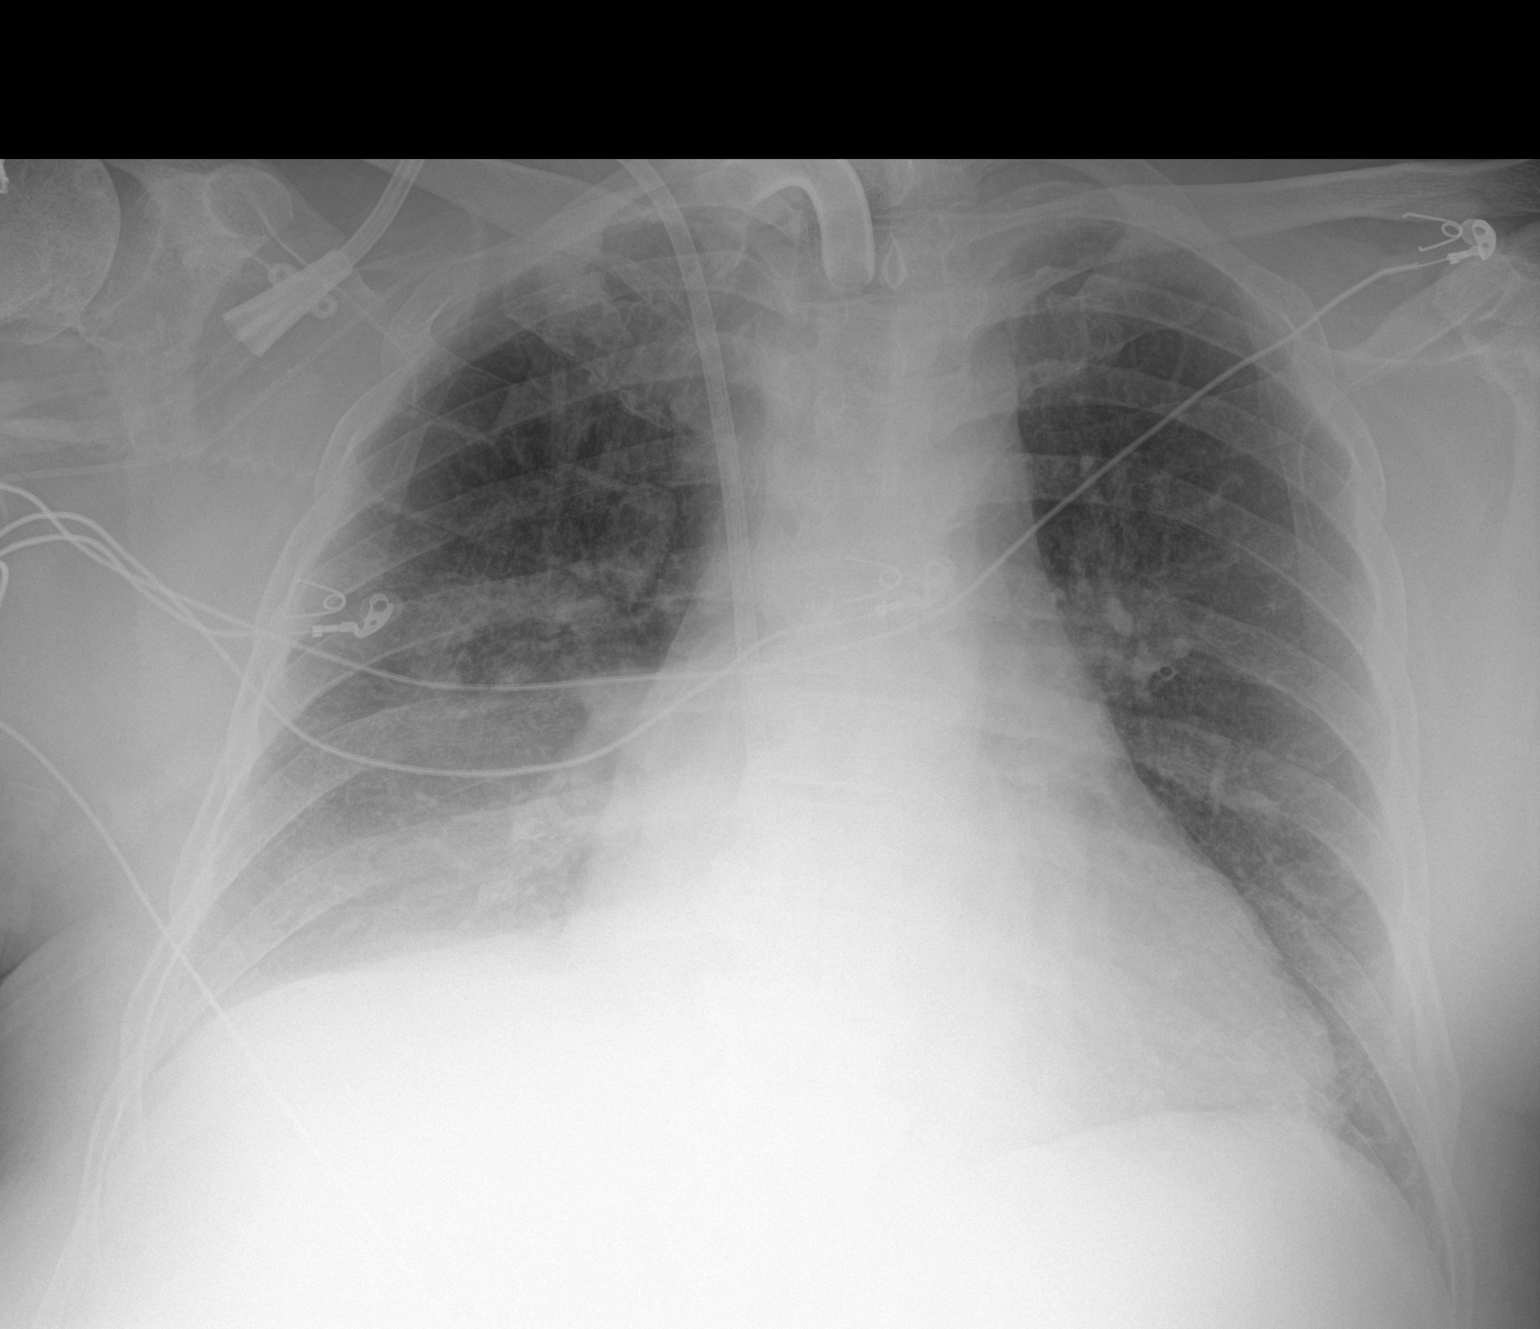

[1 of 1 positions shown; findings below may reference images not displayed]

FINDINGS: Improved vascular congestion and hazy appearance of the chest.
Continued cardiopericardial enlargement. Dialysis catheter on the
right. Tracheostomy tube in place. No pneumothorax.
IMPRESSION: Improved aeration, likely improving edema and pleural fluid.

## 2023-03-14 IMAGING — DX DG CHEST 1V PORT
1 series · 1 of 1 positions shown · non-contrast
Comparison: 06/14/2018

CLINICAL DATA: Sepsis.

EXAM:
PORTABLE CHEST 1 VIEW

[chest ap]
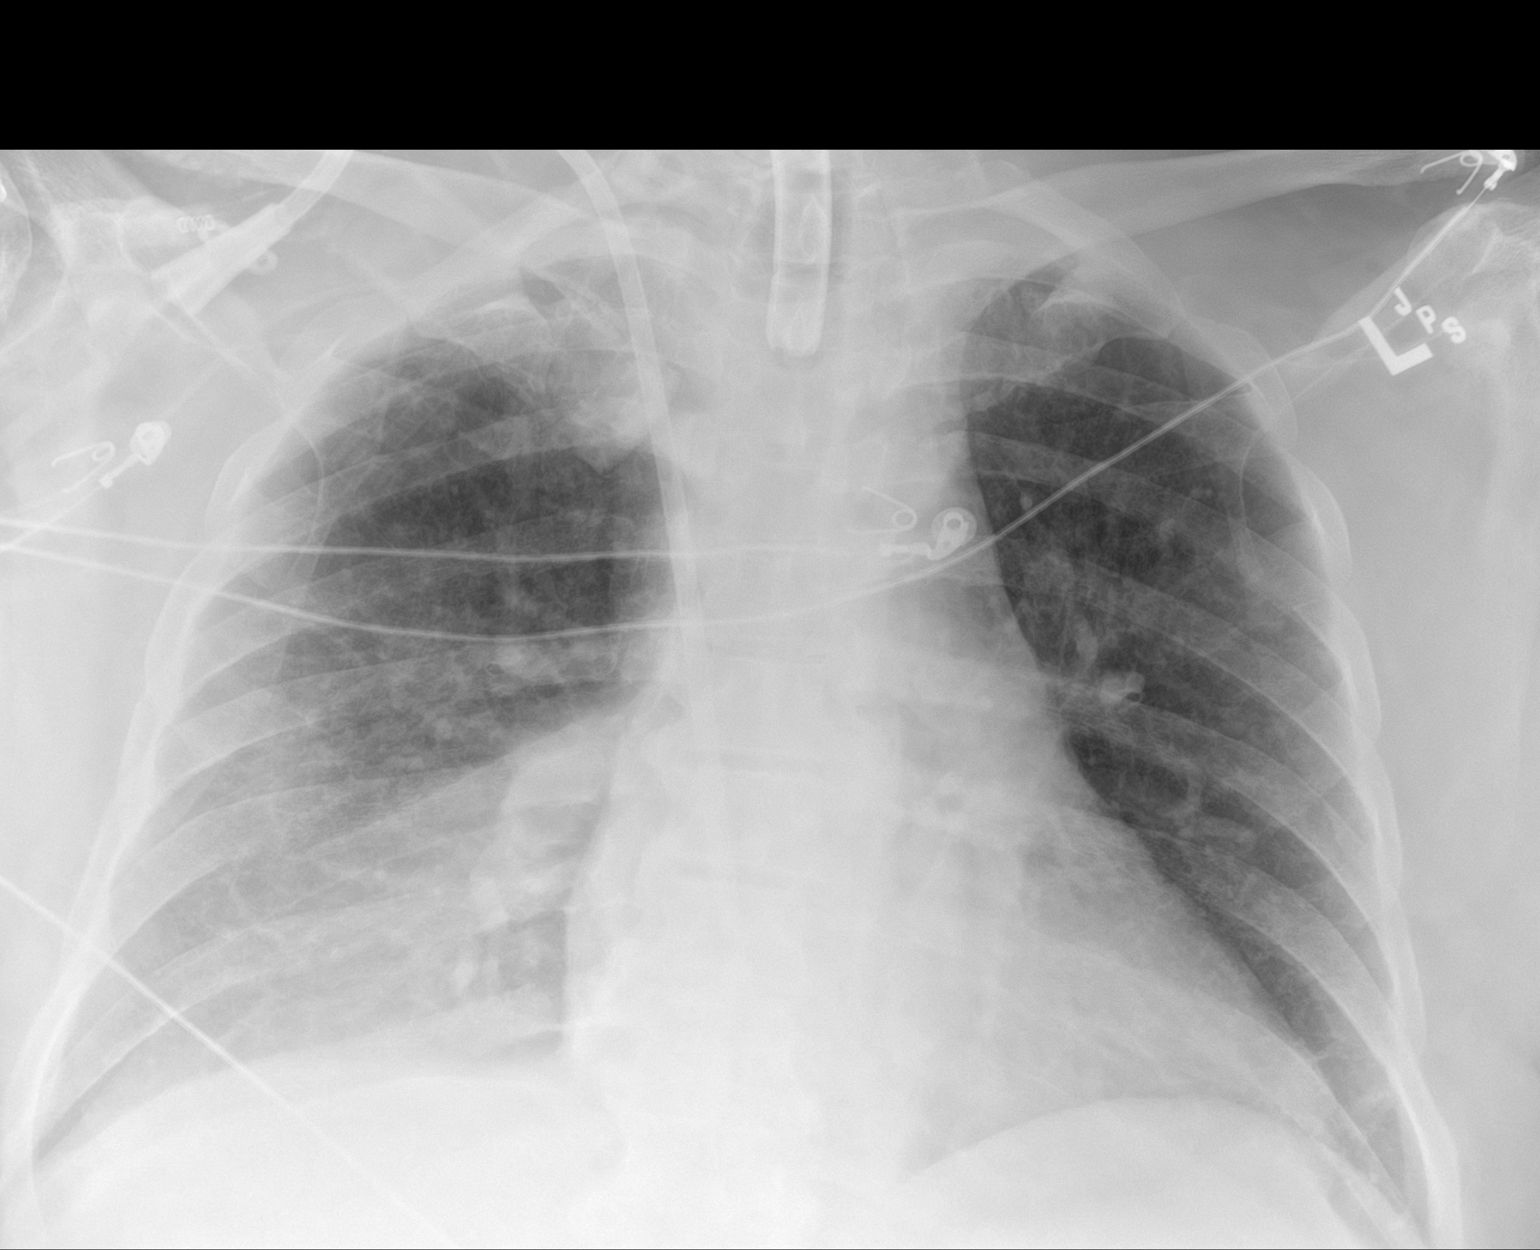

[1 of 1 positions shown; findings below may reference images not displayed]

FINDINGS: Dialysis catheter is identified with tip in the projection of the
right atrium. Tracheostomy tube tip is above the carina. Stable
cardiomediastinal contours. Pulmonary vascular congestion and right
pleural effusion appear unchanged.
IMPRESSION: No change in pulmonary vascular congestion and right pleural
effusion.
